# Patient Record
Sex: Male | Born: 1985
Health system: Southern US, Community
[De-identification: ages and names within clinical notes are randomized; demographics above are authoritative.]

## PROBLEM LIST (undated history)

## (undated) DIAGNOSIS — I10 Essential (primary) hypertension: Secondary | ICD-10-CM

## (undated) DIAGNOSIS — I639 Cerebral infarction, unspecified: Secondary | ICD-10-CM

## (undated) DIAGNOSIS — I82409 Acute embolism and thrombosis of unspecified deep veins of unspecified lower extremity: Secondary | ICD-10-CM

## (undated) DIAGNOSIS — S82892A Other fracture of left lower leg, initial encounter for closed fracture: Secondary | ICD-10-CM

## (undated) HISTORY — PX: OTHER SURGICAL HISTORY: SHX169

---

## 2005-10-27 ENCOUNTER — Emergency Department: Payer: Self-pay | Admitting: Emergency Medicine

## 2009-01-11 ENCOUNTER — Emergency Department: Payer: Self-pay | Admitting: Emergency Medicine

## 2015-08-01 ENCOUNTER — Ambulatory Visit
Admission: EM | Admit: 2015-08-01 | Discharge: 2015-08-01 | Disposition: A | Discharge status: Home / Self Care | Payer: 59 | Attending: Family Medicine | Admitting: Family Medicine

## 2015-08-01 ENCOUNTER — Encounter: Payer: Self-pay | Admitting: *Deleted

## 2015-08-01 ENCOUNTER — Ambulatory Visit (INDEPENDENT_AMBULATORY_CARE_PROVIDER_SITE_OTHER): Payer: 59

## 2015-08-01 DIAGNOSIS — S6000XA Contusion of unspecified finger without damage to nail, initial encounter: Secondary | ICD-10-CM | POA: Diagnosis not present

## 2015-08-01 DIAGNOSIS — S60221A Contusion of right hand, initial encounter: Secondary | ICD-10-CM

## 2015-08-01 MED ORDER — NAPROXEN 500 MG PO TABS: 500.0000 mg | ORAL_TABLET | Freq: Two times a day (BID) | ORAL | Status: DC

## 2015-08-01 NOTE — Discharge Instructions (Signed)
Contusion °A contusion is a deep bruise. Contusions are the result of a blunt injury to tissues and muscle fibers under the skin. The injury causes bleeding under the skin. The skin overlying the contusion may turn blue, purple, or yellow. Minor injuries will give you a painless contusion, but more severe contusions may stay painful and swollen for a few weeks.  °CAUSES  °This condition is usually caused by a blow, trauma, or direct force to an area of the body. °SYMPTOMS  °Symptoms of this condition include: °· Swelling of the injured area. °· Pain and tenderness in the injured area. °· Discoloration. The area may have redness and then turn blue, purple, or yellow. °DIAGNOSIS  °This condition is diagnosed based on a physical exam and medical history. An X-ray, CT scan, or MRI may be needed to determine if there are any associated injuries, such as broken bones (fractures). °TREATMENT  °Specific treatment for this condition depends on what area of the body was injured. In general, the best treatment for a contusion is resting, icing, applying pressure to (compression), and elevating the injured area. This is often called the RICE strategy. Over-the-counter anti-inflammatory medicines may also be recommended for pain control.  °HOME CARE INSTRUCTIONS  °· Rest the injured area. °· If directed, apply ice to the injured area: °¨ Put ice in a plastic bag. °¨ Place a towel between your skin and the bag. °¨ Leave the ice on for 20 minutes, 2-3 times per day. °· If directed, apply light compression to the injured area using an elastic bandage. Make sure the bandage is not wrapped too tightly. Remove and reapply the bandage as directed by your health care provider. °· If possible, raise (elevate) the injured area above the level of your heart while you are sitting or lying down. °· Take over-the-counter and prescription medicines only as told by your health care provider. °SEEK MEDICAL CARE IF: °· Your symptoms do not  improve after several days of treatment. °· Your symptoms get worse. °· You have difficulty moving the injured area. °SEEK IMMEDIATE MEDICAL CARE IF:  °· You have severe pain. °· You have numbness in a hand or foot. °· Your hand or foot turns pale or cold. °  °This information is not intended to replace advice given to you by your health care provider. Make sure you discuss any questions you have with your health care provider. °  °Document Released: 12/12/2004 Document Revised: 11/23/2014 Document Reviewed: 07/20/2014 °Elsevier Interactive Patient Education ©2016 [employer] ° °Hand Contusion °A hand contusion is a deep bruise on your hand area. Contusions are the result of an injury that caused bleeding under the skin. The contusion may turn blue, purple, or yellow. Minor injuries will give you a painless contusion, but more severe contusions may stay painful and swollen for a few weeks. °CAUSES  °A contusion is usually caused by a blow, trauma, or direct force to an area of the body. °SYMPTOMS  °· Swelling and redness of the injured area. °· Discoloration of the injured area. °· Tenderness and soreness of the injured area. °· Pain. °DIAGNOSIS  °The diagnosis can be made by taking a history and performing a physical exam. An X-ray, CT scan, or MRI may be needed to determine if there were any associated injuries, such as broken bones (fractures). °TREATMENT  °Often, the best treatment for a hand contusion is resting, elevating, icing, and applying cold compresses to the injured area. Over-the-counter medicines may also be recommended   for pain control. °HOME CARE INSTRUCTIONS  °· Put ice on the injured area. °¨ Put ice in a plastic bag. °¨ Place a towel between your skin and the bag. °¨ Leave the ice on for 15-20 minutes, 03-04 times a day. °· Only take over-the-counter or prescription medicines as directed by your caregiver. Your caregiver may recommend avoiding anti-inflammatory medicines (aspirin, ibuprofen,  and naproxen) for 48 hours because these medicines may increase bruising. °· If told, use an elastic wrap as directed. This can help reduce swelling. You may remove the wrap for sleeping, showering, and bathing. If your fingers become numb, cold, or blue, take the wrap off and reapply it more loosely. °· Elevate your hand with pillows to reduce swelling. °· Avoid overusing your hand if it is painful. °SEEK IMMEDIATE MEDICAL CARE IF:  °· You have increased redness, swelling, or pain in your hand. °· Your swelling or pain is not relieved with medicines. °· You have loss of feeling in your hand or are unable to move your fingers. °· Your hand turns cold or blue. °· You have pain when you move your fingers. °· Your hand becomes warm to the touch. °· Your contusion does not improve in 2 days. °MAKE SURE YOU:  °· Understand these instructions. °· Will watch your condition. °· Will get help right away if you are not doing well or get worse. °  °This information is not intended to replace advice given to you by your health care provider. Make sure you discuss any questions you have with your health care provider. °  °Document Released: 08/24/2001 Document Revised: 11/27/2011 Document Reviewed: 08/26/2011 °Elsevier Interactive Patient Education ©2016 Elsevier Inc. ° °
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
## 2015-08-15 ENCOUNTER — Inpatient Hospital Stay (HOSPITAL_COMMUNITY)
Admission: EM | Admit: 2015-08-15 | Discharge: 2015-09-01 | DRG: 003 | Disposition: A | Discharge status: Rehabilitation Facility | Payer: 59 | Attending: Neurology | Admitting: Neurology

## 2015-08-15 ENCOUNTER — Emergency Department (HOSPITAL_COMMUNITY)
Admit: 2015-08-15 | Discharge: 2015-08-15 | Disposition: A | Discharge status: Home / Self Care | Payer: 59 | Attending: Emergency Medicine | Admitting: Emergency Medicine

## 2015-08-15 ENCOUNTER — Other Ambulatory Visit (HOSPITAL_COMMUNITY): Payer: Self-pay | Admitting: Emergency Medicine

## 2015-08-15 ENCOUNTER — Other Ambulatory Visit: Payer: Self-pay

## 2015-08-15 ENCOUNTER — Encounter (HOSPITAL_COMMUNITY): Payer: Self-pay | Admitting: Emergency Medicine

## 2015-08-15 DIAGNOSIS — B9561 Methicillin susceptible Staphylococcus aureus infection as the cause of diseases classified elsewhere: Secondary | ICD-10-CM | POA: Diagnosis present

## 2015-08-15 DIAGNOSIS — Z452 Encounter for adjustment and management of vascular access device: Secondary | ICD-10-CM

## 2015-08-15 DIAGNOSIS — I619 Nontraumatic intracerebral hemorrhage, unspecified: Secondary | ICD-10-CM

## 2015-08-15 DIAGNOSIS — I248 Other forms of acute ischemic heart disease: Secondary | ICD-10-CM | POA: Diagnosis present

## 2015-08-15 DIAGNOSIS — D72829 Elevated white blood cell count, unspecified: Secondary | ICD-10-CM | POA: Diagnosis present

## 2015-08-15 DIAGNOSIS — R29710 NIHSS score 10: Secondary | ICD-10-CM | POA: Diagnosis present

## 2015-08-15 DIAGNOSIS — Z6841 Body Mass Index (BMI) 40.0 and over, adult: Secondary | ICD-10-CM

## 2015-08-15 DIAGNOSIS — I161 Hypertensive emergency: Secondary | ICD-10-CM | POA: Diagnosis present

## 2015-08-15 DIAGNOSIS — J15211 Pneumonia due to Methicillin susceptible Staphylococcus aureus: Secondary | ICD-10-CM | POA: Diagnosis present

## 2015-08-15 DIAGNOSIS — R531 Weakness: Secondary | ICD-10-CM

## 2015-08-15 DIAGNOSIS — R1310 Dysphagia, unspecified: Secondary | ICD-10-CM | POA: Diagnosis present

## 2015-08-15 DIAGNOSIS — I69191 Dysphagia following nontraumatic intracerebral hemorrhage: Secondary | ICD-10-CM

## 2015-08-15 DIAGNOSIS — R21 Rash and other nonspecific skin eruption: Secondary | ICD-10-CM | POA: Diagnosis not present

## 2015-08-15 DIAGNOSIS — E878 Other disorders of electrolyte and fluid balance, not elsewhere classified: Secondary | ICD-10-CM | POA: Diagnosis not present

## 2015-08-15 DIAGNOSIS — Z8673 Personal history of transient ischemic attack (TIA), and cerebral infarction without residual deficits: Secondary | ICD-10-CM | POA: Diagnosis present

## 2015-08-15 DIAGNOSIS — D62 Acute posthemorrhagic anemia: Secondary | ICD-10-CM | POA: Diagnosis present

## 2015-08-15 DIAGNOSIS — B9689 Other specified bacterial agents as the cause of diseases classified elsewhere: Secondary | ICD-10-CM | POA: Diagnosis not present

## 2015-08-15 DIAGNOSIS — T361X5A Adverse effect of cephalosporins and other beta-lactam antibiotics, initial encounter: Secondary | ICD-10-CM | POA: Diagnosis not present

## 2015-08-15 DIAGNOSIS — I82402 Acute embolism and thrombosis of unspecified deep veins of left lower extremity: Secondary | ICD-10-CM | POA: Diagnosis not present

## 2015-08-15 DIAGNOSIS — R4781 Slurred speech: Secondary | ICD-10-CM | POA: Diagnosis present

## 2015-08-15 DIAGNOSIS — I609 Nontraumatic subarachnoid hemorrhage, unspecified: Secondary | ICD-10-CM

## 2015-08-15 DIAGNOSIS — M7989 Other specified soft tissue disorders: Secondary | ICD-10-CM

## 2015-08-15 DIAGNOSIS — I69359 Hemiplegia and hemiparesis following cerebral infarction affecting unspecified side: Secondary | ICD-10-CM | POA: Diagnosis present

## 2015-08-15 DIAGNOSIS — J9600 Acute respiratory failure, unspecified whether with hypoxia or hypercapnia: Secondary | ICD-10-CM | POA: Diagnosis present

## 2015-08-15 DIAGNOSIS — I10 Essential (primary) hypertension: Secondary | ICD-10-CM | POA: Diagnosis present

## 2015-08-15 DIAGNOSIS — N390 Urinary tract infection, site not specified: Secondary | ICD-10-CM | POA: Diagnosis not present

## 2015-08-15 DIAGNOSIS — E870 Hyperosmolality and hypernatremia: Secondary | ICD-10-CM | POA: Diagnosis not present

## 2015-08-15 DIAGNOSIS — Z91018 Allergy to other foods: Secondary | ICD-10-CM

## 2015-08-15 DIAGNOSIS — I824Y9 Acute embolism and thrombosis of unspecified deep veins of unspecified proximal lower extremity: Secondary | ICD-10-CM

## 2015-08-15 DIAGNOSIS — E876 Hypokalemia: Secondary | ICD-10-CM | POA: Diagnosis not present

## 2015-08-15 DIAGNOSIS — I110 Hypertensive heart disease with heart failure: Secondary | ICD-10-CM | POA: Diagnosis present

## 2015-08-15 DIAGNOSIS — J9601 Acute respiratory failure with hypoxia: Secondary | ICD-10-CM | POA: Diagnosis present

## 2015-08-15 DIAGNOSIS — Z87891 Personal history of nicotine dependence: Secondary | ICD-10-CM

## 2015-08-15 DIAGNOSIS — I82452 Acute embolism and thrombosis of left peroneal vein: Secondary | ICD-10-CM | POA: Diagnosis not present

## 2015-08-15 DIAGNOSIS — Z91030 Bee allergy status: Secondary | ICD-10-CM

## 2015-08-15 DIAGNOSIS — I610 Nontraumatic intracerebral hemorrhage in hemisphere, subcortical: Secondary | ICD-10-CM | POA: Diagnosis present

## 2015-08-15 DIAGNOSIS — Z8249 Family history of ischemic heart disease and other diseases of the circulatory system: Secondary | ICD-10-CM

## 2015-08-15 DIAGNOSIS — G936 Cerebral edema: Secondary | ICD-10-CM | POA: Diagnosis not present

## 2015-08-15 DIAGNOSIS — R509 Fever, unspecified: Secondary | ICD-10-CM | POA: Insufficient documentation

## 2015-08-15 DIAGNOSIS — Z930 Tracheostomy status: Secondary | ICD-10-CM | POA: Diagnosis not present

## 2015-08-15 DIAGNOSIS — R29810 Facial weakness: Secondary | ICD-10-CM | POA: Diagnosis present

## 2015-08-15 DIAGNOSIS — J9690 Respiratory failure, unspecified, unspecified whether with hypoxia or hypercapnia: Secondary | ICD-10-CM

## 2015-08-15 DIAGNOSIS — R4189 Other symptoms and signs involving cognitive functions and awareness: Secondary | ICD-10-CM | POA: Diagnosis present

## 2015-08-15 DIAGNOSIS — H5010 Unspecified exotropia: Secondary | ICD-10-CM | POA: Diagnosis present

## 2015-08-15 DIAGNOSIS — Z791 Long term (current) use of non-steroidal anti-inflammatories (NSAID): Secondary | ICD-10-CM

## 2015-08-15 DIAGNOSIS — G935 Compression of brain: Secondary | ICD-10-CM

## 2015-08-15 DIAGNOSIS — I5033 Acute on chronic diastolic (congestive) heart failure: Secondary | ICD-10-CM | POA: Diagnosis present

## 2015-08-15 DIAGNOSIS — R402422 Glasgow coma scale score 9-12, at arrival to emergency department: Secondary | ICD-10-CM | POA: Diagnosis present

## 2015-08-15 DIAGNOSIS — G8194 Hemiplegia, unspecified affecting left nondominant side: Secondary | ICD-10-CM | POA: Diagnosis present

## 2015-08-15 DIAGNOSIS — I82409 Acute embolism and thrombosis of unspecified deep veins of unspecified lower extremity: Secondary | ICD-10-CM

## 2015-08-15 DIAGNOSIS — J40 Bronchitis, not specified as acute or chronic: Secondary | ICD-10-CM | POA: Diagnosis present

## 2015-08-15 DIAGNOSIS — Z4659 Encounter for fitting and adjustment of other gastrointestinal appliance and device: Secondary | ICD-10-CM

## 2015-08-15 DIAGNOSIS — M79662 Pain in left lower leg: Secondary | ICD-10-CM

## 2015-08-15 HISTORY — DX: Other fracture of left lower leg, initial encounter for closed fracture: S82.892A

## 2015-08-15 HISTORY — DX: Essential (primary) hypertension: I10

## 2015-08-15 LAB — ETHANOL: Alcohol, Ethyl (B): <5 mg/dL (ref <5)

## 2015-08-15 LAB — CBC
HCT: 40.8 % (ref 39.0–52.0)
Hemoglobin: 14 g/dL (ref 13.0–17.0)
MCH: 31.9 pg (ref 26.0–34.0)
MCHC: 34.3 g/dL (ref 30.0–36.0)
MCV: 92.9 fL (ref 78.0–100.0)
Platelets: 217 10*3/uL (ref 150–400)
RBC: 4.39 MIL/uL (ref 4.22–5.81)
RDW: 12.7 % (ref 11.5–15.5)
WBC: 6.7 10*3/uL (ref 4.0–10.5)

## 2015-08-15 LAB — COMPREHENSIVE METABOLIC PANEL
ALT: 20 U/L (ref 17–63)
AST: 23 U/L (ref 15–41)
Albumin: 4.3 g/dL (ref 3.5–5.0)
Alkaline Phosphatase: 58 U/L (ref 38–126)
Anion gap: 6 (ref 5–15)
BUN: 20 mg/dL (ref 6–20)
CO2: 26 mmol/L (ref 22–32)
Calcium: 8.8 mg/dL — ABNORMAL LOW (ref 8.9–10.3)
Chloride: 105 mmol/L (ref 101–111)
Creatinine, Ser: 1.2 mg/dL (ref 0.61–1.24)
GFR calc Af Amer: >60 mL/min (ref >60)
GFR calc non Af Amer: >60 mL/min (ref >60)
Glucose, Bld: 115 mg/dL — ABNORMAL HIGH (ref 65–99)
Potassium: 3.9 mmol/L (ref 3.5–5.1)
Sodium: 137 mmol/L (ref 135–145)
Total Bilirubin: 0.4 mg/dL (ref 0.3–1.2)
Total Protein: 7.1 g/dL (ref 6.5–8.1)

## 2015-08-15 LAB — APTT: aPTT: 32 seconds (ref 24–37)

## 2015-08-15 LAB — DIFFERENTIAL
Basophils Absolute: 0 10*3/uL (ref 0.0–0.1)
Basophils Relative: 0 %
Eosinophils Absolute: 0.1 10*3/uL (ref 0.0–0.7)
Eosinophils Relative: 1 %
Lymphocytes Relative: 42 %
Lymphs Abs: 2.8 10*3/uL (ref 0.7–4.0)
Monocytes Absolute: 0.4 10*3/uL (ref 0.1–1.0)
Monocytes Relative: 6 %
Neutro Abs: 3.4 10*3/uL (ref 1.7–7.7)
Neutrophils Relative %: 51 %

## 2015-08-15 LAB — PROTIME-INR
INR: 0.98 (ref 0.00–1.49)
Prothrombin Time: 13.2 seconds (ref 11.6–15.2)

## 2015-08-15 LAB — GLUCOSE, CAPILLARY: Glucose-Capillary: 126 mg/dL — ABNORMAL HIGH (ref 65–99)

## 2015-08-15 MED ORDER — NICARDIPINE HCL IN NACL 20-0.86 MG/200ML-% IV SOLN
INTRAVENOUS | Status: AC
  Filled 2015-08-15: qty 200

## 2015-08-15 MED ORDER — NICARDIPINE HCL IN NACL 20-0.86 MG/200ML-% IV SOLN
5.0000 mg/h | Freq: Once | INTRAVENOUS | Status: AC
  Administered 2015-08-15: 5 mg/h via INTRAVENOUS

## 2015-08-15 NOTE — ED Notes (Signed)
cbg of 126.

## 2015-08-15 NOTE — ED Provider Notes (Signed)
CSN: [id number]     Arrival date & time 08/15/15  2242 History  By signing my name below, I, Iona Beard, attest that this documentation has been prepared under the direction and in the presence of Marily Memos, MD.   Electronically Signed: Iona Beard, ED Scribe. 08/15/2015. 10:52 PM   Chief Complaint  Patient presents with  . Code Stroke   The history is provided by the patient and the EMS personnel. No language interpreter was used.   HPI Comments: Brad Mcgee is a 30 y.o. male with PMHx of HTN who presents to the Emergency Department via EMS complaining of sudden onset, left-sided weakness, ongoing since 9:45 PM tonight. Pt reports associated right-sided headache and slurred speech. Per EMS, symptoms began while pt was walking. No other associated symptoms noted. No worsening or alleviating factors noted. Pt denies head trauma, or any other pertinent symptoms.    Past Medical History  Diagnosis Date  . Hypertension    History reviewed. No pertinent past surgical history. Family History  Problem Relation Age of Onset  . Hypertension Mother    Social History  Substance Use Topics  . Smoking status: Never Smoker   . Smokeless tobacco: Never Used  . Alcohol Use: No    Review of Systems  Constitutional:       Pt denies head trauma.   Neurological: Positive for speech difficulty, weakness and headaches.       Left-sided weakness.   All other systems reviewed and are negative.   Allergies  Bee venom and Coconut oil  Home Medications   Prior to Admission medications   Medication Sig Start Date End Date Taking? Authorizing Provider  lisinopril (PRINIVIL,ZESTRIL) 10 MG tablet Take 10 mg by mouth daily.   Yes Historical Provider, MD  naproxen (NAPROSYN) 500 MG tablet Take 1 tablet (500 mg total) by mouth 2 (two) times daily with a meal. 08/01/15  Yes Chrissie Noa Roemer, PA-C   BP 147/105 mmHg  Pulse 84  Temp(Src) 98.2 F (36.8 C) (Oral)  Resp 19  Ht 5\' 7"   (1.702 m)  Wt 298 lb (135.172 kg)  BMI 46.66 kg/m2  SpO2 98% Physical Exam  Constitutional: He appears well-developed and well-nourished. No distress.  Slurred speech.  HENT:  Head: Normocephalic and atraumatic.  Eyes: Conjunctivae and EOM are normal.  Neck: Neck supple. No tracheal deviation present.  Cardiovascular: Normal rate.   Pulmonary/Chest: Effort normal. No respiratory distress.  Musculoskeletal: Normal range of motion.  Left arm flaccid.  Neurological: He is alert. No sensory deficit.  Slurred speech. Left sided facial droop. Left arm flaccid. Normal mental status. Normal sensation. 3/5 strength with dorsiflexion/planatarflexion of left foot.   Skin: Skin is warm and dry.  Psychiatric: He has a normal mood and affect. His behavior is normal.    ED Course  .Intubation Date/Time: 08/16/2015 6:25 PM Performed by: Marily Memos Authorized by: Marily Memos Consent: The procedure was performed in an emergent situation. Time out: Immediately prior to procedure a "time out" was called to verify the correct patient, procedure, equipment, support staff and site/side marked as required. Indications: airway protection Intubation method: video-assisted Patient status: paralyzed (RSI) Preoxygenation: BVM Sedatives: etomidate Paralytic: succinylcholine Laryngoscope size: Miller 3 Tube size: 7.5 mm Tube type: cuffed Number of attempts: 2 Ventilation between attempts: BVM Cricoid pressure: no Cords visualized: yes Cuff inflated: yes ETT to lip: 24 cm ETT to teeth: 23 cm Tube secured with: ETT holder Chest x-ray interpreted by me. Chest x-ray  findings: endotracheal tube in appropriate position Patient tolerance: Patient tolerated the procedure well with no immediate complications   CRITICAL CARE Performed by: Marily Memos Total critical care time: 75 minutes Critical care time was exclusive of separately billable procedures and treating other patients. Critical care  was necessary to treat or prevent imminent or life-threatening deterioration. Critical care was time spent personally by me on the following activities: development of treatment plan with patient and/or surrogate as well as nursing, discussions with consultants, evaluation of patient's response to treatment, examination of patient, obtaining history from patient or surrogate, ordering and performing treatments and interventions, ordering and review of laboratory studies, ordering and review of radiographic studies, pulse oximetry and re-evaluation of patient's condition.    (including critical care time) DIAGNOSTIC STUDIES: Oxygen Saturation is 98% on RA, normal by my interpretation.    COORDINATION OF CARE: 10:52 PM Discussed treatment plan with pt at bedside and pt agreed to plan.  Labs Review Labs Reviewed  COMPREHENSIVE METABOLIC PANEL - Abnormal; Notable for the following:    Glucose, Bld 115 (*)    Calcium 8.8 (*)    All other components within normal limits  URINALYSIS, ROUTINE W REFLEX MICROSCOPIC (NOT AT [facility]) - Abnormal; Notable for the following:    Color, Urine STRAW (*)    Specific Gravity, Urine <1.005 (*)    Protein, ur 100 (*)    All other components within normal limits  TROPONIN I - Abnormal; Notable for the following:    Troponin I 0.05 (*)    All other components within normal limits  URINE MICROSCOPIC-ADD ON - Abnormal; Notable for the following:    Bacteria, UA RARE (*)    All other components within normal limits  TRIGLYCERIDES - Abnormal; Notable for the following:    Triglycerides 208 (*)    All other components within normal limits  BLOOD GAS, ARTERIAL - Abnormal; Notable for the following:    pH, Arterial 7.333 (*)    pCO2 arterial 45.2 (*)    pO2, Arterial 73.9 (*)    All other components within normal limits  TROPONIN I - Abnormal; Notable for the following:    Troponin I 0.06 (*)    All other components within normal limits  BASIC METABOLIC PANEL -  Abnormal; Notable for the following:    Calcium 8.8 (*)    All other components within normal limits  GLUCOSE, CAPILLARY - Abnormal; Notable for the following:    Glucose-Capillary 113 (*)    All other components within normal limits  TROPONIN I - Abnormal; Notable for the following:    Troponin I 0.06 (*)    All other components within normal limits  MRSA PCR SCREENING  ETHANOL  PROTIME-INR  APTT  CBC  DIFFERENTIAL  URINE RAPID DRUG SCREEN, HOSP PERFORMED  SODIUM  CBC  TRIGLYCERIDES  SODIUM  SODIUM  SODIUM  CBC    Imaging Review Ct Head Wo Contrast  08/16/2015  CLINICAL DATA:  Followup intracranial hemorrhage EXAM: CT HEAD WITHOUT CONTRAST TECHNIQUE: Contiguous axial images were obtained from the base of the skull through the vertex without intravenous contrast. COMPARISON:  Earlier same day FINDINGS: Intraparenchymal hematoma in the right basal ganglia measures 5.2 x 1.9 x 3.0 cm (volume = 15.4 cc). This is not significantly changed since the previous exam 1 volume was calculated at 15.1 cc. Surrounding edema is slightly better defined. Mild mass effect with some compression of the right lateral ventricle and right-to-left shift unchanged at 2 mm.  No evidence of intraventricular or subarachnoid penetration. The remainder the brain appears normal. Calvarium is normal. No significant sinus disease. IMPRESSION: No significant change since the previous study. Right basal ganglia hematoma volume 15.4 cc. Slightly better defined surrounding edema. Electronically Signed   By: Paulina Fusi M.D.   On: 08/16/2015 12:20   Ct Head Wo Contrast  08/16/2015  CLINICAL DATA:  Follow-up intracranial hemorrhage. EXAM: CT HEAD WITHOUT CONTRAST TECHNIQUE: Contiguous axial images were obtained from the base of the skull through the vertex without intravenous contrast. COMPARISON:  Yesterday at 2238 hour FINDINGS: Right basal gangliar hemorrhage measures 4.5 x 1.9 x 3.4 cm (volume = 15.1 cc), previously 3.7  x 1.9 x 2.0 cm (volume =7.3 cc). There is persistent mass-effect on the frontal horn of the right lateral ventricle. There is now all 2 mm right to left midline shift. There is progressive supratentorial sulcal effacement. Persistent basilar cistern effacement. No intraventricular extension. No new areas of hemorrhage are seen. IMPRESSION: Increased size of acute right basal ganglia hemorrhage, with increased mass effect. There is now 2 mm right to left midline shift. There is developing supratentorial sulcal effacement and persistent basilar cistern effacement concerning for developing cerebral edema. These results were called by telephone at the time of interpretation on 08/16/2015 at 2:10 am to Dr. Marily Memos , who verbally acknowledged these results. Electronically Signed   By: Rubye Oaks M.D.   On: 08/16/2015 02:10   Dg Chest Port 1 View  08/16/2015  CLINICAL DATA:  Central line placement. EXAM: PORTABLE CHEST 1 VIEW COMPARISON:  Earlier this day at 0139 hour FINDINGS: Endotracheal tube is 3.1 cm from the carina. Enteric tube in place. Tip of the right central line in the mid SVC. No pneumothorax. Low lung volumes. Enlargement of the cardiac silhouette is unchanged. Diminished vascular congestion and pulmonary edema from prior exam. Persistent perihilar opacities. IMPRESSION: 1. Tip of the right central line in the mid SVC.  No pneumothorax. 2. Endotracheal and enteric tubes remain in place. 3. Unchanged enlargement of the cardiac silhouette. Decreased vascular congestion and edema from prior. Electronically Signed   By: Rubye Oaks M.D.   On: 08/16/2015 06:54   Dg Chest Portable 1 View  08/16/2015  CLINICAL DATA:  Endotracheal tube placement.  Initial encounter. EXAM: PORTABLE CHEST 1 VIEW COMPARISON:  None. FINDINGS: The patient's endotracheal tube is seen ending 2-3 cm above the carina. The lungs are mildly hypoexpanded. Vascular crowding and vascular congestion are seen. Increased  interstitial markings raise concern for mild pulmonary edema, though pneumonia could have a similar appearance. There is no evidence of pleural effusion or pneumothorax. The cardiomediastinal silhouette is borderline normal in size. No acute osseous abnormalities are seen. IMPRESSION: 1. Endotracheal tube seen ending 2-3 cm above the carina. 2. Lungs mildly hypoexpanded. Vascular congestion noted. Increased interstitial markings raise concern for mild pulmonary edema, though pneumonia could have a similar appearance. Electronically Signed   By: Roanna Raider M.D.   On: 08/16/2015 02:07   Dg Abd Portable 1v  08/16/2015  CLINICAL DATA:  Orogastric tube placement. EXAM: PORTABLE ABDOMEN - 1 VIEW COMPARISON:  None. FINDINGS: There is an enteric tube with 1 small chole over the stomach and tip right of midline likely over the distal stomach or proximal duodenum. Bowel gas pattern is nonobstructive. Remainder of the exam is unremarkable. IMPRESSION: No acute findings per Enteric tube with tip in the right upper quadrant likely over the distal stomach or proximal duodenum. Electronically Signed  By: Elberta Fortisaniel  Boyle M.D.   On: 08/16/2015 07:20   Ct Head Code Stroke W/o Cm  08/15/2015  CLINICAL DATA:  Left-sided weakness.  Code stroke. EXAM: CT HEAD WITHOUT CONTRAST TECHNIQUE: Contiguous axial images were obtained from the base of the skull through the vertex without intravenous contrast. COMPARISON:  None. FINDINGS: Acute right basal ganglia hemorrhage measures 3.7 x 1.9 x 2.0 cm (volume = 7.3 cc). There is mild edema and mass effect on the frontal horn of the right lateral ventricle but no significant midline shift. No intraventricular extension. No additional acute hemorrhage. There is crowding of the basilar cisterns. No acute ischemia elsewhere. The calvarium is intact. Included paranasal sinuses and mastoid air cells are well aerated. IMPRESSION: Acute right basal ganglia hemorrhage measuring 3.7 x 1.9 x 2.0 cm.  Mild surrounding edema and mass effect, no midline shift. There is crowding of the basilar cisterns. Critical Value/emergent results were called by telephone at the time of interpretation on 08/15/2015 at 10:56 pm to Dr. Clayborne DanaMesner, who verbally acknowledged these results. Electronically Signed   By: Rubye OaksMelanie  Ehinger M.D.   On: 08/15/2015 22:56   I have personally reviewed and evaluated these images and lab results as part of my medical decision-making.   EKG Interpretation None      MDM   Final diagnoses:  Encounter for orogastric (OG) tube placement  Respiratory failure (HCC)  Encounter for central line placement  ICH (intracerebral hemorrhage) (HCC)   Acute onset of facial droop and left sided weakness at 2145. Ems called immediately. Taken straight to CT here where r basal ganglia bleed was noted. Patient with 0/5 in LUE, left facial droop and 3/5 in LLE initially. No headache or other complaints. Awake, joking, giving thumbs up with right hand.   [facility] and [facility] to get [facility] bed. Patient now with headache, diaphoresis and tachycardia. Sympathetic response likely 2/2 pain, will order fentanyl.   Patient with decreasing mental status. Concern for clinical worsening and anticipated course, so intubated for airway protection prior to transport after discussion with neurology. Difficult intubation 2/2 quick desaturation. Sedated with propfol and fentnayl. Hyperventilated, mannitor given, HOB at 30 degrees. Repeat CT with worsening of bleed volume, updated family , pateint transferred via [facility] in critical condition.   I personally performed the services described in this documentation, which was scribed in my presence. The recorded information has been reviewed and is accurate.       Marily MemosJason Blessin Kanno, MD 08/16/15 [phone]

## 2015-08-15 NOTE — ED Notes (Signed)
Pt c/o slurred speech and left sided weakness that started at 2145. Pt c/o headache.

## 2015-08-16 ENCOUNTER — Inpatient Hospital Stay (HOSPITAL_COMMUNITY): Payer: 59

## 2015-08-16 DIAGNOSIS — J988 Other specified respiratory disorders: Secondary | ICD-10-CM

## 2015-08-16 DIAGNOSIS — R509 Fever, unspecified: Secondary | ICD-10-CM | POA: Diagnosis not present

## 2015-08-16 DIAGNOSIS — R1310 Dysphagia, unspecified: Secondary | ICD-10-CM | POA: Diagnosis present

## 2015-08-16 DIAGNOSIS — I610 Nontraumatic intracerebral hemorrhage in hemisphere, subcortical: Principal | ICD-10-CM

## 2015-08-16 DIAGNOSIS — T361X5A Adverse effect of cephalosporins and other beta-lactam antibiotics, initial encounter: Secondary | ICD-10-CM | POA: Diagnosis not present

## 2015-08-16 DIAGNOSIS — R21 Rash and other nonspecific skin eruption: Secondary | ICD-10-CM | POA: Diagnosis not present

## 2015-08-16 DIAGNOSIS — E870 Hyperosmolality and hypernatremia: Secondary | ICD-10-CM | POA: Diagnosis not present

## 2015-08-16 DIAGNOSIS — N179 Acute kidney failure, unspecified: Secondary | ICD-10-CM | POA: Diagnosis not present

## 2015-08-16 DIAGNOSIS — Z91030 Bee allergy status: Secondary | ICD-10-CM | POA: Diagnosis not present

## 2015-08-16 DIAGNOSIS — R402422 Glasgow coma scale score 9-12, at arrival to emergency department: Secondary | ICD-10-CM | POA: Diagnosis present

## 2015-08-16 DIAGNOSIS — G935 Compression of brain: Secondary | ICD-10-CM | POA: Diagnosis not present

## 2015-08-16 DIAGNOSIS — I5032 Chronic diastolic (congestive) heart failure: Secondary | ICD-10-CM | POA: Diagnosis not present

## 2015-08-16 DIAGNOSIS — I619 Nontraumatic intracerebral hemorrhage, unspecified: Secondary | ICD-10-CM | POA: Diagnosis not present

## 2015-08-16 DIAGNOSIS — Z6841 Body Mass Index (BMI) 40.0 and over, adult: Secondary | ICD-10-CM | POA: Diagnosis not present

## 2015-08-16 DIAGNOSIS — I82409 Acute embolism and thrombosis of unspecified deep veins of unspecified lower extremity: Secondary | ICD-10-CM | POA: Diagnosis not present

## 2015-08-16 DIAGNOSIS — I10 Essential (primary) hypertension: Secondary | ICD-10-CM | POA: Diagnosis present

## 2015-08-16 DIAGNOSIS — J9600 Acute respiratory failure, unspecified whether with hypoxia or hypercapnia: Secondary | ICD-10-CM | POA: Diagnosis not present

## 2015-08-16 DIAGNOSIS — I5033 Acute on chronic diastolic (congestive) heart failure: Secondary | ICD-10-CM | POA: Diagnosis present

## 2015-08-16 DIAGNOSIS — B9561 Methicillin susceptible Staphylococcus aureus infection as the cause of diseases classified elsewhere: Secondary | ICD-10-CM | POA: Diagnosis present

## 2015-08-16 DIAGNOSIS — I160 Hypertensive urgency: Secondary | ICD-10-CM

## 2015-08-16 DIAGNOSIS — J40 Bronchitis, not specified as acute or chronic: Secondary | ICD-10-CM | POA: Diagnosis present

## 2015-08-16 DIAGNOSIS — I6789 Other cerebrovascular disease: Secondary | ICD-10-CM

## 2015-08-16 DIAGNOSIS — G936 Cerebral edema: Secondary | ICD-10-CM | POA: Diagnosis present

## 2015-08-16 DIAGNOSIS — I82492 Acute embolism and thrombosis of other specified deep vein of left lower extremity: Secondary | ICD-10-CM | POA: Diagnosis not present

## 2015-08-16 DIAGNOSIS — E871 Hypo-osmolality and hyponatremia: Secondary | ICD-10-CM | POA: Diagnosis not present

## 2015-08-16 DIAGNOSIS — B9689 Other specified bacterial agents as the cause of diseases classified elsewhere: Secondary | ICD-10-CM | POA: Diagnosis not present

## 2015-08-16 DIAGNOSIS — E878 Other disorders of electrolyte and fluid balance, not elsewhere classified: Secondary | ICD-10-CM | POA: Diagnosis not present

## 2015-08-16 DIAGNOSIS — D62 Acute posthemorrhagic anemia: Secondary | ICD-10-CM | POA: Diagnosis present

## 2015-08-16 DIAGNOSIS — I110 Hypertensive heart disease with heart failure: Secondary | ICD-10-CM | POA: Diagnosis present

## 2015-08-16 DIAGNOSIS — I69191 Dysphagia following nontraumatic intracerebral hemorrhage: Secondary | ICD-10-CM | POA: Diagnosis not present

## 2015-08-16 DIAGNOSIS — Z430 Encounter for attention to tracheostomy: Secondary | ICD-10-CM | POA: Diagnosis not present

## 2015-08-16 DIAGNOSIS — Z8249 Family history of ischemic heart disease and other diseases of the circulatory system: Secondary | ICD-10-CM | POA: Diagnosis not present

## 2015-08-16 DIAGNOSIS — E669 Obesity, unspecified: Secondary | ICD-10-CM | POA: Diagnosis not present

## 2015-08-16 DIAGNOSIS — J15211 Pneumonia due to Methicillin susceptible Staphylococcus aureus: Secondary | ICD-10-CM | POA: Diagnosis present

## 2015-08-16 DIAGNOSIS — G8194 Hemiplegia, unspecified affecting left nondominant side: Secondary | ICD-10-CM | POA: Diagnosis present

## 2015-08-16 DIAGNOSIS — I161 Hypertensive emergency: Secondary | ICD-10-CM | POA: Diagnosis present

## 2015-08-16 DIAGNOSIS — I82402 Acute embolism and thrombosis of unspecified deep veins of left lower extremity: Secondary | ICD-10-CM | POA: Diagnosis not present

## 2015-08-16 DIAGNOSIS — M79662 Pain in left lower leg: Secondary | ICD-10-CM | POA: Diagnosis not present

## 2015-08-16 DIAGNOSIS — Z87891 Personal history of nicotine dependence: Secondary | ICD-10-CM | POA: Diagnosis not present

## 2015-08-16 DIAGNOSIS — I609 Nontraumatic subarachnoid hemorrhage, unspecified: Secondary | ICD-10-CM

## 2015-08-16 DIAGNOSIS — Z7901 Long term (current) use of anticoagulants: Secondary | ICD-10-CM | POA: Diagnosis not present

## 2015-08-16 DIAGNOSIS — Z91018 Allergy to other foods: Secondary | ICD-10-CM | POA: Diagnosis not present

## 2015-08-16 DIAGNOSIS — J9601 Acute respiratory failure with hypoxia: Secondary | ICD-10-CM | POA: Diagnosis present

## 2015-08-16 DIAGNOSIS — R29710 NIHSS score 10: Secondary | ICD-10-CM | POA: Diagnosis present

## 2015-08-16 DIAGNOSIS — E662 Morbid (severe) obesity with alveolar hypoventilation: Secondary | ICD-10-CM | POA: Diagnosis not present

## 2015-08-16 DIAGNOSIS — R29810 Facial weakness: Secondary | ICD-10-CM | POA: Diagnosis present

## 2015-08-16 DIAGNOSIS — Z791 Long term (current) use of non-steroidal anti-inflammatories (NSAID): Secondary | ICD-10-CM | POA: Diagnosis not present

## 2015-08-16 DIAGNOSIS — H5010 Unspecified exotropia: Secondary | ICD-10-CM | POA: Diagnosis present

## 2015-08-16 DIAGNOSIS — Z930 Tracheostomy status: Secondary | ICD-10-CM | POA: Diagnosis not present

## 2015-08-16 DIAGNOSIS — R531 Weakness: Secondary | ICD-10-CM | POA: Diagnosis present

## 2015-08-16 DIAGNOSIS — N390 Urinary tract infection, site not specified: Secondary | ICD-10-CM | POA: Diagnosis not present

## 2015-08-16 DIAGNOSIS — I248 Other forms of acute ischemic heart disease: Secondary | ICD-10-CM | POA: Diagnosis present

## 2015-08-16 DIAGNOSIS — I69391 Dysphagia following cerebral infarction: Secondary | ICD-10-CM | POA: Diagnosis not present

## 2015-08-16 DIAGNOSIS — E876 Hypokalemia: Secondary | ICD-10-CM | POA: Diagnosis not present

## 2015-08-16 DIAGNOSIS — D72829 Elevated white blood cell count, unspecified: Secondary | ICD-10-CM | POA: Diagnosis not present

## 2015-08-16 DIAGNOSIS — R4781 Slurred speech: Secondary | ICD-10-CM | POA: Diagnosis present

## 2015-08-16 DIAGNOSIS — R4189 Other symptoms and signs involving cognitive functions and awareness: Secondary | ICD-10-CM | POA: Diagnosis present

## 2015-08-16 DIAGNOSIS — M7989 Other specified soft tissue disorders: Secondary | ICD-10-CM | POA: Diagnosis not present

## 2015-08-16 LAB — URINALYSIS, ROUTINE W REFLEX MICROSCOPIC
Bilirubin Urine: NEGATIVE
Glucose, UA: NEGATIVE mg/dL
Hgb urine dipstick: NEGATIVE
Ketones, ur: NEGATIVE mg/dL
Leukocytes, UA: NEGATIVE
Nitrite: NEGATIVE
Protein, ur: 100 mg/dL — AB
Specific Gravity, Urine: <1.005 — ABNORMAL LOW (ref 1.005–1.030)
pH: 6.5 (ref 5.0–8.0)

## 2015-08-16 LAB — ECHOCARDIOGRAM COMPLETE
Height: 67 in
Weight: 4768 oz

## 2015-08-16 LAB — TROPONIN I
Troponin I: 0.05 ng/mL — ABNORMAL HIGH (ref <0.031)
Troponin I: 0.06 ng/mL — ABNORMAL HIGH (ref <0.031)
Troponin I: 0.06 ng/mL — ABNORMAL HIGH (ref <0.031)

## 2015-08-16 LAB — BLOOD GAS, ARTERIAL
Acid-base deficit: 1.7 mmol/L (ref 0.0–2.0)
Bicarbonate: 23.3 mEq/L (ref 20.0–24.0)
Drawn by: 418751
FIO2: 0.4
MECHVT: 600 mL
O2 Saturation: 93.2 %
PEEP: 5 cmH2O
Patient temperature: 98.6
RATE: 16 resp/min
TCO2: 24.7 mmol/L (ref 0–100)
pCO2 arterial: 45.2 mmHg — ABNORMAL HIGH (ref 35.0–45.0)
pH, Arterial: 7.333 — ABNORMAL LOW (ref 7.350–7.450)
pO2, Arterial: 73.9 mmHg — ABNORMAL LOW (ref 80.0–100.0)

## 2015-08-16 LAB — BASIC METABOLIC PANEL
Anion gap: 6 (ref 5–15)
BUN: 13 mg/dL (ref 6–20)
CO2: 25 mmol/L (ref 22–32)
Calcium: 8.8 mg/dL — ABNORMAL LOW (ref 8.9–10.3)
Chloride: 105 mmol/L (ref 101–111)
Creatinine, Ser: 1.04 mg/dL (ref 0.61–1.24)
GFR calc Af Amer: >60 mL/min (ref >60)
GFR calc non Af Amer: >60 mL/min (ref >60)
Glucose, Bld: 97 mg/dL (ref 65–99)
Potassium: 4.4 mmol/L (ref 3.5–5.1)
Sodium: 136 mmol/L (ref 135–145)

## 2015-08-16 LAB — CBC
HCT: 42.2 % (ref 39.0–52.0)
Hemoglobin: 14.2 g/dL (ref 13.0–17.0)
MCH: 31.1 pg (ref 26.0–34.0)
MCHC: 33.6 g/dL (ref 30.0–36.0)
MCV: 92.5 fL (ref 78.0–100.0)
Platelets: 200 10*3/uL (ref 150–400)
RBC: 4.56 MIL/uL (ref 4.22–5.81)
RDW: 12.8 % (ref 11.5–15.5)
WBC: 8 10*3/uL (ref 4.0–10.5)

## 2015-08-16 LAB — SODIUM
Sodium: 141 mmol/L (ref 135–145)
Sodium: 141 mmol/L (ref 135–145)
Sodium: 144 mmol/L (ref 135–145)

## 2015-08-16 LAB — RAPID URINE DRUG SCREEN, HOSP PERFORMED
Amphetamines: NOT DETECTED
Barbiturates: NOT DETECTED
Benzodiazepines: NOT DETECTED
Cocaine: NOT DETECTED
Opiates: NOT DETECTED
Tetrahydrocannabinol: NOT DETECTED

## 2015-08-16 LAB — URINE MICROSCOPIC-ADD ON: Squamous Epithelial / LPF: NONE SEEN

## 2015-08-16 LAB — MRSA PCR SCREENING: MRSA by PCR: NEGATIVE

## 2015-08-16 LAB — GLUCOSE, CAPILLARY: Glucose-Capillary: 113 mg/dL — ABNORMAL HIGH (ref 65–99)

## 2015-08-16 LAB — TRIGLYCERIDES
Triglycerides: 208 mg/dL — ABNORMAL HIGH (ref <150)
Triglycerides: 148 mg/dL (ref <150)

## 2015-08-16 MED ORDER — PROPOFOL 1000 MG/100ML IV EMUL
0.0000 ug/kg/min | INTRAVENOUS | Status: DC
  Administered 2015-08-16: 6.164 ug/kg/min via INTRAVENOUS
  Filled 2015-08-16: qty 100

## 2015-08-16 MED ORDER — DOCUSATE SODIUM 50 MG/5ML PO LIQD
100.0000 mg | Freq: Two times a day (BID) | ORAL | Status: DC | PRN
  Filled 2015-08-16: qty 10

## 2015-08-16 MED ORDER — CHLORHEXIDINE GLUCONATE 0.12% ORAL RINSE (MEDLINE KIT)
15.0000 mL | Freq: Two times a day (BID) | OROMUCOSAL | Status: DC
  Administered 2015-08-16 – 2015-08-27 (×22): 15 mL via OROMUCOSAL

## 2015-08-16 MED ORDER — MANNITOL 20 % IV SOLN
0.5000 g/kg | Freq: Once | INTRAVENOUS | Status: AC
  Administered 2015-08-16: 67.6 g via INTRAVENOUS
  Filled 2015-08-16: qty 500

## 2015-08-16 MED ORDER — SENNOSIDES-DOCUSATE SODIUM 8.6-50 MG PO TABS
1.0000 | ORAL_TABLET | Freq: Two times a day (BID) | ORAL | Status: DC
  Administered 2015-08-16 – 2015-08-21 (×10): 1 via ORAL
  Filled 2015-08-16 (×10): qty 1

## 2015-08-16 MED ORDER — FENTANYL CITRATE (PF) 100 MCG/2ML IJ SOLN
100.0000 ug | INTRAMUSCULAR | Status: DC | PRN
  Filled 2015-08-16: qty 2

## 2015-08-16 MED ORDER — SODIUM CHLORIDE 3 % IV SOLN
INTRAVENOUS | Status: DC
  Administered 2015-08-16 – 2015-08-17 (×8): 100 mL/h via INTRAVENOUS
  Administered 2015-08-18: 50 mL/h via INTRAVENOUS
  Administered 2015-08-18 (×2): 100 mL/h via INTRAVENOUS
  Administered 2015-08-19 – 2015-08-22 (×8): 50 mL/h via INTRAVENOUS
  Administered 2015-08-22: 25 mL/h via INTRAVENOUS
  Administered 2015-08-22: 50 mL/h via INTRAVENOUS
  Filled 2015-08-16 (×33): qty 500

## 2015-08-16 MED ORDER — PROPOFOL 1000 MG/100ML IV EMUL
0.0000 ug/kg/min | INTRAVENOUS | Status: DC
  Administered 2015-08-16 – 2015-08-18 (×18): 50 ug/kg/min via INTRAVENOUS
  Administered 2015-08-18: 25 ug/kg/min via INTRAVENOUS
  Administered 2015-08-18 (×2): 50 ug/kg/min via INTRAVENOUS
  Administered 2015-08-18: 35 ug/kg/min via INTRAVENOUS
  Administered 2015-08-18 – 2015-08-19 (×5): 50 ug/kg/min via INTRAVENOUS
  Administered 2015-08-19: 25 ug/kg/min via INTRAVENOUS
  Administered 2015-08-19 (×2): 50 ug/kg/min via INTRAVENOUS
  Administered 2015-08-19 – 2015-08-20 (×7): 25 ug/kg/min via INTRAVENOUS
  Administered 2015-08-21: 10 ug/kg/min via INTRAVENOUS
  Administered 2015-08-21: 25 ug/kg/min via INTRAVENOUS
  Administered 2015-08-21: 10 ug/kg/min via INTRAVENOUS
  Administered 2015-08-21: 30 ug/kg/min via INTRAVENOUS
  Filled 2015-08-16 (×43): qty 100

## 2015-08-16 MED ORDER — ANTISEPTIC ORAL RINSE SOLUTION (CORINZ)
7.0000 mL | OROMUCOSAL | Status: DC
  Administered 2015-08-16 – 2015-08-25 (×87): 7 mL via OROMUCOSAL

## 2015-08-16 MED ORDER — FENTANYL BOLUS VIA INFUSION
50.0000 ug | INTRAVENOUS | Status: DC | PRN
  Filled 2015-08-16: qty 50

## 2015-08-16 MED ORDER — NICARDIPINE HCL IN NACL 40-0.83 MG/200ML-% IV SOLN
3.0000 mg/h | INTRAVENOUS | Status: DC
  Administered 2015-08-16: 5 mg/h via INTRAVENOUS
  Administered 2015-08-16 (×2): 15 mg/h via INTRAVENOUS
  Administered 2015-08-16: 10 mg/h via INTRAVENOUS
  Administered 2015-08-16: 15 mg/h via INTRAVENOUS
  Administered 2015-08-17: 5 mg/h via INTRAVENOUS
  Administered 2015-08-18: 12.5 mg/h via INTRAVENOUS
  Administered 2015-08-18 – 2015-08-19 (×4): 5 mg/h via INTRAVENOUS
  Administered 2015-08-19: 7.5 mg/h via INTRAVENOUS
  Administered 2015-08-20 (×2): 5 mg/h via INTRAVENOUS
  Administered 2015-08-21: 11.5 mg/h via INTRAVENOUS
  Administered 2015-08-21: 7.5 mg/h via INTRAVENOUS
  Administered 2015-08-21: 15 mg/h via INTRAVENOUS
  Administered 2015-08-21: 11.5 mg/h via INTRAVENOUS
  Administered 2015-08-21 (×2): 13 mg/h via INTRAVENOUS
  Administered 2015-08-21: 12.5 mg/h via INTRAVENOUS
  Administered 2015-08-21: 11.5 mg/h via INTRAVENOUS
  Filled 2015-08-16 (×19): qty 200
  Filled 2015-08-16: qty 400
  Filled 2015-08-16 (×3): qty 200

## 2015-08-16 MED ORDER — FENTANYL CITRATE (PF) 100 MCG/2ML IJ SOLN
50.0000 ug | Freq: Once | INTRAMUSCULAR | Status: AC
  Administered 2015-08-16: 50 ug via INTRAVENOUS
  Filled 2015-08-16: qty 2

## 2015-08-16 MED ORDER — MIDAZOLAM HCL 2 MG/2ML IJ SOLN
2.0000 mg | INTRAMUSCULAR | Status: DC | PRN
  Administered 2015-08-16: 2 mg via INTRAVENOUS
  Filled 2015-08-16: qty 2

## 2015-08-16 MED ORDER — FENTANYL CITRATE (PF) 2500 MCG/50ML IJ SOLN
INTRAMUSCULAR | Status: AC
  Filled 2015-08-16: qty 50

## 2015-08-16 MED ORDER — MIDAZOLAM HCL 2 MG/2ML IJ SOLN: 2.0000 mg | INTRAMUSCULAR | Status: DC | PRN

## 2015-08-16 MED ORDER — BISACODYL 10 MG RE SUPP: 10.0000 mg | Freq: Every day | RECTAL | Status: DC | PRN

## 2015-08-16 MED ORDER — ACETAMINOPHEN 650 MG RE SUPP
650.0000 mg | RECTAL | Status: DC | PRN
  Administered 2015-08-19: 650 mg via RECTAL
  Filled 2015-08-16 (×2): qty 1

## 2015-08-16 MED ORDER — LABETALOL HCL 5 MG/ML IV SOLN
10.0000 mg | INTRAVENOUS | Status: DC | PRN
  Administered 2015-08-18 – 2015-08-19 (×3): 20 mg via INTRAVENOUS
  Filled 2015-08-16 (×3): qty 4

## 2015-08-16 MED ORDER — STROKE: EARLY STAGES OF RECOVERY BOOK
Freq: Once | Status: AC
  Administered 2015-08-16: 04:00:00
  Filled 2015-08-16: qty 1

## 2015-08-16 MED ORDER — FENTANYL CITRATE (PF) 100 MCG/2ML IJ SOLN
100.0000 ug | INTRAMUSCULAR | Status: DC | PRN
  Administered 2015-08-16 (×2): 100 ug via INTRAVENOUS

## 2015-08-16 MED ORDER — SODIUM CHLORIDE 0.9 % IV SOLN
25.0000 ug/h | INTRAVENOUS | Status: DC
  Administered 2015-08-17 – 2015-08-20 (×4): 100 ug/h via INTRAVENOUS
  Administered 2015-08-21: 75 ug/h via INTRAVENOUS
  Administered 2015-08-21: 150 ug/h via INTRAVENOUS
  Filled 2015-08-16 (×7): qty 50

## 2015-08-16 MED ORDER — SODIUM CHLORIDE 0.9 % IV SOLN
150.0000 ug/h | INTRAVENOUS | Status: DC
  Filled 2015-08-16: qty 50

## 2015-08-16 MED ORDER — ACETAMINOPHEN 325 MG PO TABS
650.0000 mg | ORAL_TABLET | ORAL | Status: DC | PRN
  Administered 2015-08-17 – 2015-08-27 (×22): 650 mg via ORAL
  Filled 2015-08-16 (×23): qty 2

## 2015-08-16 MED ORDER — MIDAZOLAM HCL 2 MG/2ML IJ SOLN
2.0000 mg | INTRAMUSCULAR | Status: DC | PRN
  Administered 2015-08-22: 2 mg via INTRAVENOUS
  Filled 2015-08-16 (×2): qty 2

## 2015-08-16 MED ORDER — NICARDIPINE HCL IN NACL 20-0.86 MG/200ML-% IV SOLN
3.0000 mg/h | INTRAVENOUS | Status: DC
  Administered 2015-08-16: 15 mg/h via INTRAVENOUS
  Filled 2015-08-16 (×2): qty 200

## 2015-08-16 MED ORDER — FENTANYL CITRATE (PF) 100 MCG/2ML IJ SOLN: 100.0000 ug | INTRAMUSCULAR | Status: DC | PRN

## 2015-08-16 MED ORDER — NICARDIPINE HCL IN NACL 20-0.86 MG/200ML-% IV SOLN
INTRAVENOUS | Status: AC
  Administered 2015-08-16: 20 mg
  Filled 2015-08-16: qty 200

## 2015-08-16 MED ORDER — PROPOFOL 1000 MG/100ML IV EMUL
INTRAVENOUS | Status: AC
  Filled 2015-08-16: qty 100

## 2015-08-16 MED ORDER — PANTOPRAZOLE SODIUM 40 MG IV SOLR
40.0000 mg | Freq: Every day | INTRAVENOUS | Status: DC
  Administered 2015-08-16: 40 mg via INTRAVENOUS
  Filled 2015-08-16: qty 40

## 2015-08-16 NOTE — Progress Notes (Signed)
  Echocardiogram 2D Echocardiogram has been performed.  Leta JunglingCooper, [place] Agent M 08/16/2015, 2:51 PM

## 2015-08-16 NOTE — Consult Note (Signed)
PULMONARY / CRITICAL CARE MEDICINE   Name: Brad Mcgee MRN: [id number] DOB: 1986/03/04    ADMISSION DATE:  08/15/2015 CONSULTATION DATE:  08/16/2015  REFERRING MD:  Dr. Amada Jupiter  CHIEF COMPLAINT:  Left side weakness  HISTORY OF PRESENT ILLNESS:   Hx from chart.  30 yo male with slurred speech and Lt sided weakness.  This was associated with headache.  He was intubated for airway protection.  He had CT head which showed 3.7 x 1.9 x 2 cm Rt basal ganglia ICH which then increased to 4.5 x 1.9 x 3.4 cm ICH on f/u CT head.  His BP was 187/106.  PAST MEDICAL HISTORY :  He  has a past medical history of Hypertension.  PAST SURGICAL HISTORY: He has no significant surgical history  Allergies  Allergen Reactions  . Bee Venom Anaphylaxis  . Coconut Oil Anaphylaxis    No current facility-administered medications on file prior to encounter.   Current Outpatient Prescriptions on File Prior to Encounter  Medication Sig  . naproxen (NAPROSYN) 500 MG tablet Take 1 tablet (500 mg total) by mouth 2 (two) times daily with a meal.    FAMILY HISTORY:  His family history is significant for hypertension  SOCIAL HISTORY: He  reports that he has never smoked. He has never used smokeless tobacco. He reports that he does not drink alcohol or use illicit drugs.  REVIEW OF SYSTEMS:   Unable to obtain  SUBJECTIVE:   VITAL SIGNS: BP 179/114 mmHg  Pulse 98  Temp(Src) 98 F (36.7 C) (Oral)  Resp 18  Ht  (1.702 m)  Wt 298 lb (135.172 kg)  BMI 46.66 kg/m2  SpO2 100%  HEMODYNAMICS:    VENTILATOR SETTINGS: Vent Mode:  [-] PRVC FiO2 (%):  [70 %-100 %] 70 % Set Rate:  [16 bmp-25 bmp] 16 bmp Vt Set:  [600 mL] 600 mL PEEP:  [5 cmH20] 5 cmH20 Plateau Pressure:  [26 cmH20] 26 cmH20  INTAKE / OUTPUT:    PHYSICAL EXAMINATION: General:  In ICU Neuro:  RASS -3 HEENT:  Pupils pinpoint, ETT in place Cardiovascular:  Regular, no murmur Lungs:  No wheeze Abdomen:  Soft, non  tender Musculoskeletal:  No edema Skin:  No rashes  LABS:  BMET  Recent Labs Lab 08/15/15 2245  NA 137  K 3.9  CL 105  CO2 26  BUN 20  CREATININE 1.20  GLUCOSE 115*    Electrolytes  Recent Labs Lab 08/15/15 2245  CALCIUM 8.8*    CBC  Recent Labs Lab 08/15/15 2245  WBC 6.7  HGB 14.0  HCT 40.8  PLT 217    Coag's  Recent Labs Lab 08/15/15 2245  APTT 32  INR 0.98    Sepsis Markers No results for input(s): LATICACIDVEN, PROCALCITON, O2SATVEN in the last 168 hours.  ABG No results for input(s): PHART, PCO2ART, PO2ART in the last 168 hours.  Liver Enzymes  Recent Labs Lab 08/15/15 2245  AST 23  ALT 20  ALKPHOS 58  BILITOT 0.4  ALBUMIN 4.3    Cardiac Enzymes  Recent Labs Lab 08/15/15 2245  TROPONINI 0.05*    Glucose  Recent Labs Lab 08/15/15 2259  GLUCAP 126*    Imaging Ct Head Wo Contrast  08/16/2015  CLINICAL DATA:  Follow-up intracranial hemorrhage. EXAM: CT HEAD WITHOUT CONTRAST TECHNIQUE: Contiguous axial images were obtained from the base of the skull through the vertex without intravenous contrast. COMPARISON:  Yesterday at 2238 hour FINDINGS: Right basal gangliar hemorrhage measures  4.5 x 1.9 x 3.4 cm (volume = 15.1 cc), previously 3.7 x 1.9 x 2.0 cm (volume =7.3 cc). There is persistent mass-effect on the frontal horn of the right lateral ventricle. There is now all 2 mm right to left midline shift. There is progressive supratentorial sulcal effacement. Persistent basilar cistern effacement. No intraventricular extension. No new areas of hemorrhage are seen. IMPRESSION: Increased size of acute right basal ganglia hemorrhage, with increased mass effect. There is now 2 mm right to left midline shift. There is developing supratentorial sulcal effacement and persistent basilar cistern effacement concerning for developing cerebral edema. These results were called by telephone at the time of interpretation on 08/16/2015 at 2:10 am to Dr.  Marily Memos , who verbally acknowledged these results. Electronically Signed   By: Rubye Oaks M.D.   On: 08/16/2015 02:10   Dg Chest Portable 1 View  08/16/2015  CLINICAL DATA:  Endotracheal tube placement.  Initial encounter. EXAM: PORTABLE CHEST 1 VIEW COMPARISON:  None. FINDINGS: The patient's endotracheal tube is seen ending 2-3 cm above the carina. The lungs are mildly hypoexpanded. Vascular crowding and vascular congestion are seen. Increased interstitial markings raise concern for mild pulmonary edema, though pneumonia could have a similar appearance. There is no evidence of pleural effusion or pneumothorax. The cardiomediastinal silhouette is borderline normal in size. No acute osseous abnormalities are seen. IMPRESSION: 1. Endotracheal tube seen ending 2-3 cm above the carina. 2. Lungs mildly hypoexpanded. Vascular congestion noted. Increased interstitial markings raise concern for mild pulmonary edema, though pneumonia could have a similar appearance. Electronically Signed   By: Roanna Raider M.D.   On: 08/16/2015 02:07   Ct Head Code Stroke W/o Cm  08/15/2015  CLINICAL DATA:  Left-sided weakness.  Code stroke. EXAM: CT HEAD WITHOUT CONTRAST TECHNIQUE: Contiguous axial images were obtained from the base of the skull through the vertex without intravenous contrast. COMPARISON:  None. FINDINGS: Acute right basal ganglia hemorrhage measures 3.7 x 1.9 x 2.0 cm (volume = 7.3 cc). There is mild edema and mass effect on the frontal horn of the right lateral ventricle but no significant midline shift. No intraventricular extension. No additional acute hemorrhage. There is crowding of the basilar cisterns. No acute ischemia elsewhere. The calvarium is intact. Included paranasal sinuses and mastoid air cells are well aerated. IMPRESSION: Acute right basal ganglia hemorrhage measuring 3.7 x 1.9 x 2.0 cm. Mild surrounding edema and mass effect, no midline shift. There is crowding of the basilar cisterns.  Critical Value/emergent results were called by telephone at the time of interpretation on 08/15/2015 at 10:56 pm to Dr. Clayborne Dana, who verbally acknowledged these results. Electronically Signed   By: Rubye Oaks M.D.   On: 08/15/2015 22:56     STUDIES:  5/30 CT head >> 3.7 x 1.9 x 2 cm Rt basal ganglia ICH  CULTURES:  ANTIBIOTICS:  SIGNIFICANT EVENTS: 5/30 Admit 5/31 Start 3% NS  LINES/TUBES: 3/30 ETT >>  3/31 CVL >>   DISCUSSION: 30 yo male with headache, slurred speech and Lt side weakness from Rt basal ganglia ICH.  ASSESSMENT / PLAN:  NEUROLOGIC A:   Rt basal ganglia ICH. P:   RASS goal: -2 Rest per neuro  PULMONARY A: Compromised airway in setting of ICH. High risk for OSA. P:   Full vent support F/u CXR, ABG  CARDIOVASCULAR A:  HTN urgency. Mild levated troponin. P:  Cardene to keep SBP < 160 F/u cardiac enzymes, ECG, Echo Hold outpt lisinopril  RENAL A:  Medically induced hypernatremia. P:   Goal Na 150 to 155 per neuro Monitor renal fx, urine outpt  GASTROINTESTINAL A:   Nutrition. P:   Tube feeds Protonix for SUP  HEMATOLOGIC A:   No acute issues. P:  F/u CBC SCDs  INFECTIOUS A:   No evidence for infection. P:   Monitor clinically  ENDOCRINE A:   No acute issues.   P:   Monitor blood sugar on BMET   Updated pt's family at bedside.  D/w Dr. Amada JupiterKirkpatrick.  CC time 37 minutes.  Coralyn HellingVineet Jazminn Pomales, MD [facility] Pulmonary/Critical Care 08/16/2015, 4:33 AM Pager:  [phone] After 3pm call: [phone]

## 2015-08-16 NOTE — Progress Notes (Signed)
STROKE TEAM PROGRESS NOTE   HISTORY OF PRESENT ILLNESS (per record) Brad Mcgee is a 30 y.o. male with a history of hypertension who presents with ICH to [facility]. He was apparently normal at 9:30 pm 08/15/2015, then developed witnessed onset left sided weakness at that time. He was taken [facility] where he was found to have a basal ganglia ICH. Following the initial CT scan, he had subsequent worsening with depressed mental status. Due to this, he required intubation. He is a history of long standing hypertension. Intracerebral Hemorrhage (ICH) ScoreTotal: 1. Patient was not administered IV t-PA secondary to [place]. He was transferred to [facility] and admitted to the neuro ICU for further evaluation and treatment.he was intubated and sedated   SUBJECTIVE (INTERVAL HISTORY) Patients Mother at bedside, known history of HTN, but recently started taking medication. Family hx of HTN, even in 31 y o sister. Mother denies any other significant medical hx, denies illicit drug use in pt. He is sedated on propofol, responds to pain.BP controlled on cardene drip.   OBJECTIVE Temp:  [98 F (36.7 C)-98.2 F (36.8 C)] 98.1 F (36.7 C) (05/31 0415) Pulse Rate:  [74-117] 79 (05/31 0700) Cardiac Rhythm:  [-] Normal sinus rhythm (05/31 0600) Resp:  [11-30] 17 (05/31 0700) BP: (102-187)/(55-138) 102/57 mmHg (05/31 0700) SpO2:  [94 %-100 %] 100 % (05/31 0700) FiO2 (%):  [70 %-100 %] 70 % (05/31 0600) Weight:  [135.172 kg (298 lb)] 135.172 kg (298 lb) (05/30 2254)  CBC:  Recent Labs Lab 08/15/15 2245 08/16/15 0417  WBC 6.7 8.0  NEUTROABS 3.4  --   HGB 14.0 14.2  HCT 40.8 42.2  MCV 92.9 92.5  PLT 217 673    Basic Metabolic Panel:  Recent Labs Lab 08/15/15 2245 08/16/15 0417  NA 137 136  K 3.9 4.4  CL 105 105  CO2 26 25  GLUCOSE 115* 97  BUN 20 13  CREATININE 1.20 1.04  CALCIUM 8.8* 8.8*    Lipid Panel:    Component Value Date/Time   TRIG 148 08/16/2015 0435   HgbA1c: No results  found for: HGBA1C Urine Drug Screen:    Component Value Date/Time   LABOPIA NONE DETECTED 08/16/2015 0033   COCAINSCRNUR NONE DETECTED 08/16/2015 0033   LABBENZ NONE DETECTED 08/16/2015 0033   AMPHETMU NONE DETECTED 08/16/2015 0033   THCU NONE DETECTED 08/16/2015 0033   LABBARB NONE DETECTED 08/16/2015 0033      IMAGING  Ct Head Wo Contrast  08/16/2015  CLINICAL DATA:  Follow-up intracranial hemorrhage. EXAM: CT HEAD WITHOUT CONTRAST TECHNIQUE: Contiguous axial images were obtained from the base of the skull through the vertex without intravenous contrast. COMPARISON:  Yesterday at 2238 hour FINDINGS: Right basal gangliar hemorrhage measures 4.5 x 1.9 x 3.4 cm (volume = 15.1 cc), previously 3.7 x 1.9 x 2.0 cm (volume =7.3 cc). There is persistent mass-effect on the frontal horn of the right lateral ventricle. There is now all 2 mm right to left midline shift. There is progressive supratentorial sulcal effacement. Persistent basilar cistern effacement. No intraventricular extension. No new areas of hemorrhage are seen. IMPRESSION: Increased size of acute right basal ganglia hemorrhage, with increased mass effect. There is now 2 mm right to left midline shift. There is developing supratentorial sulcal effacement and persistent basilar cistern effacement concerning for developing cerebral edema. These results were called by telephone at the time of interpretation on 08/16/2015 at 2:10 am to Dr. Merrily Pew , who verbally acknowledged these results.  Electronically Signed   By: Jeb Levering M.D.   On: 08/16/2015 02:10   Dg Chest Port 1 View  08/16/2015  CLINICAL DATA:  Central line placement. EXAM: PORTABLE CHEST 1 VIEW COMPARISON:  Earlier this day at 0139 hour FINDINGS: Endotracheal tube is 3.1 cm from the carina. Enteric tube in place. Tip of the right central line in the mid SVC. No pneumothorax. Low lung volumes. Enlargement of the cardiac silhouette is unchanged. Diminished vascular congestion  and pulmonary edema from prior exam. Persistent perihilar opacities. IMPRESSION: 1. Tip of the right central line in the mid SVC.  No pneumothorax. 2. Endotracheal and enteric tubes remain in place. 3. Unchanged enlargement of the cardiac silhouette. Decreased vascular congestion and edema from prior. Electronically Signed   By: Jeb Levering M.D.   On: 08/16/2015 06:54   Dg Chest Portable 1 View  08/16/2015  CLINICAL DATA:  Endotracheal tube placement.  Initial encounter. EXAM: PORTABLE CHEST 1 VIEW COMPARISON:  None. FINDINGS: The patient's endotracheal tube is seen ending 2-3 cm above the carina. The lungs are mildly hypoexpanded. Vascular crowding and vascular congestion are seen. Increased interstitial markings raise concern for mild pulmonary edema, though pneumonia could have a similar appearance. There is no evidence of pleural effusion or pneumothorax. The cardiomediastinal silhouette is borderline normal in size. No acute osseous abnormalities are seen. IMPRESSION: 1. Endotracheal tube seen ending 2-3 cm above the carina. 2. Lungs mildly hypoexpanded. Vascular congestion noted. Increased interstitial markings raise concern for mild pulmonary edema, though pneumonia could have a similar appearance. Electronically Signed   By: Garald Balding M.D.   On: 08/16/2015 02:07   Dg Abd Portable 1v  08/16/2015  CLINICAL DATA:  Orogastric tube placement. EXAM: PORTABLE ABDOMEN - 1 VIEW COMPARISON:  None. FINDINGS: There is an enteric tube with 1 small chole over the stomach and tip right of midline likely over the distal stomach or proximal duodenum. Bowel gas pattern is nonobstructive. Remainder of the exam is unremarkable. IMPRESSION: No acute findings per Enteric tube with tip in the right upper quadrant likely over the distal stomach or proximal duodenum. Electronically Signed   By: Marin Olp M.D.   On: 08/16/2015 07:20   Ct Head Code Stroke W/o Cm  08/15/2015  CLINICAL DATA:  Left-sided weakness.   Code stroke. EXAM: CT HEAD WITHOUT CONTRAST TECHNIQUE: Contiguous axial images were obtained from the base of the skull through the vertex without intravenous contrast. COMPARISON:  None. FINDINGS: Acute right basal ganglia hemorrhage measures 3.7 x 1.9 x 2.0 cm (volume = 7.3 cc). There is mild edema and mass effect on the frontal horn of the right lateral ventricle but no significant midline shift. No intraventricular extension. No additional acute hemorrhage. There is crowding of the basilar cisterns. No acute ischemia elsewhere. The calvarium is intact. Included paranasal sinuses and mastoid air cells are well aerated. IMPRESSION: Acute right basal ganglia hemorrhage measuring 3.7 x 1.9 x 2.0 cm. Mild surrounding edema and mass effect, no midline shift. There is crowding of the basilar cisterns. Critical Value/emergent results were called by telephone at the time of interpretation on 08/15/2015 at 10:56 pm to Dr. Dayna Barker, who verbally acknowledged these results. Electronically Signed   By: Jeb Levering M.D.   On: 08/15/2015 22:56   PHYSICAL EXAM GENERAL- Intubated, sedated, appears as stated age, not in any distress, Obese. HEENT- Atraumatic, normocephalic, Pupils constricted, reactive and equal, disconjugate, EOMI CARDIAC- RRR, no murmurs, rubs or gallops. RESP- Moving equal volumes of  air, and clear to auscultation bilaterally, anteriorly ABDOMEN- Soft, obese EXTREMITIES- pulse 2+, symmetric, no pedal edema. SKIN- Warm, dry, No rash or lesion.   NEUROLOGICAL EXAM : intubated and sedated- eyes closed, Pupils 2 mm reactive. Fundi nor visualized.Dolls eye present, dysconjugate gaze with mild exotropia left eye.Corneal reflex intact- but more brisk on the right, no obvious facial assym, moving RUE, RLE, LLE to pain, withdraws to pain, some purposeful movement. Minimal LUE movement to pain ASSESSMENT/PLAN Brad Mcgee is a 30 y.o. male with history of hypertension presenting with L sided  weakness. CT showed a L basal ganglia hemorrhage, with change in mental status, pt had repeat Ct head which showed increase size of hrr, and increased mass effect, pt and was subsequently intubated.  Stroke:  Non-dominant right basal ganglia hemorrhage secondary to hypertension with resultant neuro worsening, hemorrhage extension and cerebral edema. Volume of blood calculated- 25cc.  Resultant  L hemiparesis, VDRF, HA, dysphagia  Repeat CT this afternoon- Stable hematoma.   CTA Pending    MRI/MRA  2D Echo  pending   SCDs for VTE prophylaxis  Diet NPO time specified, consider starting tube feeds.  No antithrombotic prior to admission  Ongoing aggressive stroke risk factor management  Therapy recommendations:  pending   Disposition:  pending   Acute respiratory failure  Secondary to hemorrhage  Intubated in the ED  CCM managing vent, On propofol and fentanyl  Cardiomegaly and abnormal EKG  Seen on chest xray, most likely related to HTN heart dx  Echo pending  Flat elevated troponin, t wave inversions in inferior lat leads.  Might need cardiac eval at some point.  Induced Hypernatremia  To prevent cerebral edema  CL placed and started on 3% NA drip   Na 136 >141.  Na goal - 150- 155.  Dysphagia  secondary to stroke  Place NG, tube feeds per PCCM.  Hypertensive Emergency  BP 182/124 on arrival in setting of acute neurologic symptoms  BP < 160 since 0400  On cardene  SBP goal < 160  Other Stroke Risk Factors  Morbid Obesity, Body mass index is 46.66 kg/(m^2).   Other [facility] day # 0  Bing Neighbors, MD. IMTS- resident. 08/16/2015 2:29 PM  I have personally examined this patient, reviewed notes, independently viewed imaging studies, participated in medical decision making and plan of care. I have made any additions or clarifications directly to the above note. Agree with note above. He presented with right basal ganglia  hemorrhage likely due to  Malignant hypertension and developed hematoma expansion but neurological worsening requiring intubation.  He remains at risk for neurological worsening, hydrocephalus, cytotoxic cerebral edema and needs ongoing ventilatory support,, hypertonic saline to control cerebral edema and aggressive blood pressure control.  I had a long discussion of the bedside with the patient's mother about his condition, prognosis, treatment plan and answered questions. Plan repeat CT angiogram tomorrow morning and keep sodium 150-155 with hypertonic saline and blood pressure below 696 systolic for 24 hours This patient is critically ill and at significant risk of neurological worsening, death and care requires constant monitoring of vital signs, hemodynamics,respiratory and cardiac monitoring, extensive review of multiple databases, frequent neurological assessment, discussion with family, other specialists and medical decision making of high complexity.I have made any additions or clarifications directly to the above note.This critical care time does not reflect procedure time, or teaching time or supervisory time of PA/NP/Med Resident etc but could involve care discussion time.  I spent 50 minutes of neurocritical care time  in the care of  this patient.     Antony Contras, MD Medical Director [facility] Pager: [phone] 08/16/2015 3:00 PM

## 2015-08-16 NOTE — Procedures (Signed)
Central Venous Catheter Insertion Procedure Note [name] M Grandt [id number] 04/09/1985  Procedure: Insertion of Central Venous Catheter Indications: Drug and/or fluid administration  Procedure Details Consent: Risks of procedure as well as the alternatives and risks of each were explained to the (patient/caregiver).  Consent for procedure obtained. Time Out: Verified patient identification, verified procedure, site/side was marked, verified correct patient position, special equipment/implants available, medications/allergies/relevent history reviewed, required imaging and test results available.  Performed  Maximum sterile technique was used including antiseptics, cap, gloves, gown, hand hygiene, mask and sheet. Skin prep: Chlorhexidine; local anesthetic administered A antimicrobial bonded/coated triple lumen catheter was placed in the right internal jugular vein using the Seldinger technique.  Evaluation Blood flow good Complications: No apparent complications Patient did tolerate procedure well. Chest X-ray ordered to verify placement.  CXR: pending.  Placed with u/s guidance.  Coralyn HellingVineet Devonia Farro, MD [facility] Pulmonary/Critical Care 08/16/2015, 5:22 AM Pager:  [phone] After 3pm call: [phone]

## 2015-08-16 NOTE — H&P (Addendum)
Neurology H&P  CC: left sided weakness  History is obtained from:chart review  HPI: Brad Mcgee is a 30 y.o. male with a history of hypertension who presents with ICH to [place]. He was apparently normal at 9:30 pm, then developed witnessed onset left sided weakness at that time. He is taken [facility] where he was found to have a basal ganglia ICH. Following the initial CT scan, he had subsequent worsening with depressed mental status. Due to this, he required intubation.   He is a history of hypertension.  LKW: 9:30pm tpa given?: no, ich  Intracerebral Hemorrhage (ICH) Score  Glascow Coma Score  5-12 +1  Age >/= 80 no 0  ICH volume >/= 30ml  no 0  IVH no 0  Infratentorial origin no 0 Total:  1  ROS: Unable to obtain due to altered mental status.   Past Medical History  Diagnosis Date  . Hypertension      Family History  Problem Relation Age of Onset  . Hypertension Mother      Social History:  reports that he has never smoked. He has never used smokeless tobacco. He reports that he does not drink alcohol or use illicit drugs.   Exam: Current vital signs: BP 107/57 mmHg  Pulse 80  Temp(Src) 98.1 F (36.7 C) (Axillary)  Resp 16  Ht  (1.702 m)  Wt 135.172 kg (298 lb)  BMI 46.66 kg/m2  SpO2 100% Vital signs in last 24 hours: Temp:  [98 F (36.7 C)-98.2 F (36.8 C)] 98.1 F (36.7 C) (05/31 0415) Pulse Rate:  [74-117] 80 (05/31 0645) Resp:  [11-30] 16 (05/31 0645) BP: (107-187)/(55-138) 107/57 mmHg (05/31 0645) SpO2:  [94 %-100 %] 100 % (05/31 0645) FiO2 (%):  [70 %-100 %] 70 % (05/31 0600) Weight:  [135.172 kg (298 lb)] 135.172 kg (298 lb) (05/30 2254)   Physical Exam  Constitutional: Appears well-developed and well-nourished.  Psych: Affect appropriate to situation Eyes: No scleral injection HENT: No OP obstrucion Head: Normocephalic.  Cardiovascular: Normal rate and regular rhythm.  Respiratory: Effort normal and breath sounds normal  to anterior ascultation GI: Soft.  No distension. There is no tenderness.  Skin: WDI  Neuro: Mental Status: With sedation paused, he does wake up and follow commands, showing thumbs up on the right wiggling toes. Cranial Nerves: II: Does not blink to threat Pupils are small but equal, round, and reactive to light.   III,IV, VI: he attempts to look to the right, crossing midline slightly but does not look to the left at all. I am unable to elicit doll's eye response. I'm not sure if this is an artifact of sedation (too awake) or an actual finding V: VII: Corneals intact VIII: hearing is intact to voice X: Cough intact Motor: He does not move his left side, he follows commands with good strength on the right. Sensory: Responds to noxious stimuli 4 Cerebellar: Does not perform.    I have reviewed labs in epic and the results pertinent to this consultation are: CMP-unremarkable Borderline troponin  I have reviewed the images obtained: CT head-enlarging right basal ganglia hemorrhage. The hemorrhage is still relatively small, however he has very little room to spare intracranially with effaced basilar cisterns.  Impression: 30 year old male with a history of hypertension who presents with basal ganglia hemorrhage, likely a complication of hypertension. Given his worsening, he was given mannitol in the ED at [facility]. I am concerned regarding his effaced basilar cisterns and will  start him on 3%. I discussed this case with neurosurgery (Dr. Bevely Palmeritty) he does not feel that he would be a surgical candidate.  Recommendations: 1) Admit to ICU 2) no antiplatelets or anticoagulants 3) blood pressure control with goal systolic <140 4) Frequent neuro checks 5) repeat head CT in the morning 6) PT,OT,ST 7) 3% normal saline 8) appreciate CCM assistance 9) trench troponins 10) nicardipine for blood pressure    This patient is critically ill and at significant risk of neurological worsening,  death and care requires constant monitoring of vital signs, hemodynamics,respiratory and cardiac monitoring, neurological assessment, discussion with family, other specialists and medical decision making of high complexity. I spent 50 minutes of neurocritical care time  in the care of  this patient.  Ritta SlotMcNeill Solymar Grace, MD [facility] [phone]  If 7pm- 7am, please page neurology on call as listed in AMION. 08/16/2015  6:54 AM

## 2015-08-16 NOTE — ED Notes (Signed)
[facility] [place]

## 2015-08-16 NOTE — Consult Note (Signed)
CC:  Chief Complaint  Patient presents with  . Code Stroke    HPI: Brad Mcgee is a 30 y.o. male with right basal ganglia hemorrhage.  Developed acute onset of headache and left hemiparesis last night.  His mental status deteriorated and he required intubation for airway protection.  PMH: Past Medical History  Diagnosis Date  . Hypertension     PSH: History reviewed. No pertinent past surgical history.  SH: Social History  Substance Use Topics  . Smoking status: Never Smoker   . Smokeless tobacco: Never Used  . Alcohol Use: No    MEDS: Prior to Admission medications   Medication Sig Start Date End Date Taking? Authorizing Provider  lisinopril (PRINIVIL,ZESTRIL) 10 MG tablet Take 10 mg by mouth daily.   Yes Historical Provider, MD  naproxen (NAPROSYN) 500 MG tablet Take 1 tablet (500 mg total) by mouth 2 (two) times daily with a meal. 08/01/15  Yes Lutricia FeilWilliam P Roemer, PA-C    ALLERGY: Allergies  Allergen Reactions  . Bee Venom Anaphylaxis  . Coconut Oil Anaphylaxis    ROS: Review of Systems  Unable to perform ROS: intubated    NEUROLOGIC EXAM: Intubated, opens eyes to painful stimulus CN intact Dense left hemiparesis. Responds to painful stimulus on right side  IMAGING: Right basal ganglia hemorrhage with interval enlargement.  IMPRESSION: - 30 y.o. male with right basal ganglia hemorrhage.  PLAN: - No role for surgical intervention - Medical management as already being performed by neurology service - Would get CTA to rule out underlying structural lesion

## 2015-08-16 NOTE — ED Notes (Signed)
[facility] here with pt, preparing for transport

## 2015-08-16 NOTE — Progress Notes (Signed)
Initial Nutrition Assessment  DOCUMENTATION CODES:   Morbid obesity  INTERVENTION:   If remains intubated recommend starting Adult TF Protocol.   NUTRITION DIAGNOSIS:   Inadequate oral intake related to inability to eat as evidenced by NPO status.  GOAL:   Provide needs based on ASPEN/SCCM guidelines  MONITOR:   Vent status, TF tolerance  REASON FOR ASSESSMENT:   Ventilator    ASSESSMENT:   30 yo male with slurred speech and Lt sided weakness. This was associated with headache. He was intubated for airway protection. He had CT head which showed 3.7 x 1.9 x 2 cm Rt basal ganglia ICH which then increased to 4.5 x 1.9 x 3.4 cm ICH on f/u CT head. His BP was 187/106.  Patient is currently intubated on ventilator support MV: 11.4 L/min Temp (24hrs), Avg:98.2 F (36.8 C), Min:98 F (36.7 C), Max:98.5 F (36.9 C)  Propofol: 40.6 ml/hr provides: 1071 kcal per day from lipid Medications reviewed and include: senokot-s, 3% Labs reviewed: TG 148 OG tube Will be difficult to meet protein needs due to high rates of propofol.  Pt discussed during ICU rounds and with RN.  NFPE completed no findings.   Diet Order:  Diet NPO time specified  Skin:  Reviewed, no issues  Last BM:  unknown  Height:   Ht Readings from Last 1 Encounters:  08/15/15 5\' 7"  (1.702 m)    Weight:   Wt Readings from Last 1 Encounters:  08/15/15 298 lb (135.172 kg)    Ideal Body Weight:  67.2 kg  BMI:  Body mass index is 46.66 kg/(m^2).  Estimated Nutritional Needs:   Kcal:  [id number]  Protein:  >/= 168 grams  Fluid:  > 1.5 L/day  EDUCATION NEEDS:   No education needs identified at this time  Kendell BaneHeather Deovion Batrez RD, LDN, CNSC [phone] Pager [phone] After Hours Pager

## 2015-08-16 NOTE — Consult Note (Deleted)
Neurology H&P  CC: left sided weakness  History is obtained from:chart review  HPI: Brad Mcgee is a 30 y.o. male with a history of hypertension who presents with ICH to [place]. He was apparently normal at 9:30 pm, then developed witnessed onset left sided weakness at that time. He is taken [facility] where he was found to have a basal ganglia ICH. Following the initial CT scan, he had subsequent worsening with depressed mental status. Due to this, he required intubation.   He is a history of hypertension.  LKW: 9:30pm tpa given?: no, ich   ROS: Unable to obtain due to altered mental status.   Past Medical History  Diagnosis Date  . Hypertension      Family History  Problem Relation Age of Onset  . Hypertension Mother      Social History:  reports that he has never smoked. He has never used smokeless tobacco. He reports that he does not drink alcohol or use illicit drugs.   Exam: Current vital signs: BP 179/114 mmHg  Pulse 98  Temp(Src) 98 F (36.7 C) (Oral)  Resp 18  Ht  (1.702 m)  Wt 135.172 kg (298 lb)  BMI 46.66 kg/m2  SpO2 100% Vital signs in last 24 hours: Temp:  [98 F (36.7 C)-98.2 F (36.8 C)] 98 F (36.7 C) (05/30 2349) Pulse Rate:  [74-117] 98 (05/31 0400) Resp:  [11-30] 18 (05/31 0400) BP: (113-187)/(55-138) 179/114 mmHg (05/31 0400) SpO2:  [94 %-100 %] 100 % (05/31 0400) FiO2 (%):  [70 %-100 %] 70 % (05/31 0353) Weight:  [135.172 kg (298 lb)] 135.172 kg (298 lb) (05/30 2254)   Physical Exam  Constitutional: Appears well-developed and well-nourished.  Psych: Affect appropriate to situation Eyes: No scleral injection HENT: No OP obstrucion Head: Normocephalic.  Cardiovascular: Normal rate and regular rhythm.  Respiratory: Effort normal and breath sounds normal to anterior ascultation GI: Soft.  No distension. There is no tenderness.  Skin: WDI  Neuro: Mental Status: With sedation paused, he does wake up and follow commands,  showing thumbs up on the right wiggling toes. Cranial Nerves: II: Does not blink to threat Pupils are small but equal, round, and reactive to light.   III,IV, VI: he attempts to look to the right, crossing midline slightly but does not look to the left at all. I am unable to elicit doll's eye response. I'm not sure if this is an artifact of sedation (too awake) or an actual finding V: VII: Corneals intact VIII: hearing is intact to voice X: Cough intact Motor: He does not move his left side, he follows commands with good strength on the right. Sensory: Responds to noxious stimuli 4 Cerebellar: Does not perform.    I have reviewed labs in epic and the results pertinent to this consultation are: CMP-unremarkable Borderline troponin  I have reviewed the images obtained: CT head-enlarging right basal ganglia hemorrhage. The hemorrhage is still relatively small, however he has very little room to spare intracranially with effaced basilar cisterns.  Impression: 30 year old male with a history of hypertension who presents with basal ganglia hemorrhage, likely a complication of hypertension. Given his worsening, he was given mannitol in the ED at [facility]. I am concerned regarding his effaced basilar cisterns and will start him on 3%. I discussed this case with neurosurgery (Dr. Bevely Palmer) he does not feel that he would be a surgical candidate.  Recommendations: 1) Admit to ICU 2) no antiplatelets or anticoagulants 3) blood pressure  control with goal systolic <140 4) Frequent neuro checks 5) repeat head CT in the morning 6) PT,OT,ST 7) 3% normal saline 8) appreciate CCM assistance 9) trench troponins 10) nicardipine for blood pressure    This patient is critically ill and at significant risk of neurological worsening, death and care requires constant monitoring of vital signs, hemodynamics,respiratory and cardiac monitoring, neurological assessment, discussion with family, other  specialists and medical decision making of high complexity. I spent 50 minutes of neurocritical care time  in the care of  this patient.  Ritta SlotMcNeill Keene Gilkey, MD [facility] [phone]  If 7pm- 7am, please page neurology on call as listed in AMION. 08/16/2015  6:28 AM

## 2015-08-16 NOTE — Progress Notes (Signed)
Code stroke.   Beeper at 1025pm In room at 1033 Rad called at 1044 Soc at 1044

## 2015-08-17 ENCOUNTER — Inpatient Hospital Stay (HOSPITAL_COMMUNITY): Payer: 59

## 2015-08-17 DIAGNOSIS — E669 Obesity, unspecified: Secondary | ICD-10-CM

## 2015-08-17 DIAGNOSIS — I10 Essential (primary) hypertension: Secondary | ICD-10-CM

## 2015-08-17 DIAGNOSIS — I5032 Chronic diastolic (congestive) heart failure: Secondary | ICD-10-CM

## 2015-08-17 LAB — CBC
HCT: 38.6 % — ABNORMAL LOW (ref 39.0–52.0)
Hemoglobin: 12.2 g/dL — ABNORMAL LOW (ref 13.0–17.0)
MCH: 30.8 pg (ref 26.0–34.0)
MCHC: 31.6 g/dL (ref 30.0–36.0)
MCV: 97.5 fL (ref 78.0–100.0)
Platelets: 196 10*3/uL (ref 150–400)
RBC: 3.96 MIL/uL — ABNORMAL LOW (ref 4.22–5.81)
RDW: 13.7 % (ref 11.5–15.5)
WBC: 7.1 10*3/uL (ref 4.0–10.5)

## 2015-08-17 LAB — SODIUM
Sodium: 147 mmol/L — ABNORMAL HIGH (ref 135–145)
Sodium: 150 mmol/L — ABNORMAL HIGH (ref 135–145)
Sodium: 150 mmol/L — ABNORMAL HIGH (ref 135–145)

## 2015-08-17 LAB — GLUCOSE, CAPILLARY
Glucose-Capillary: 105 mg/dL — ABNORMAL HIGH (ref 65–99)
Glucose-Capillary: 95 mg/dL (ref 65–99)

## 2015-08-17 MED ORDER — VITAL HIGH PROTEIN PO LIQD
1000.0000 mL | ORAL | Status: DC
  Administered 2015-08-17 – 2015-08-20 (×4): 1000 mL

## 2015-08-17 MED ORDER — PRO-STAT SUGAR FREE PO LIQD
60.0000 mL | Freq: Every day | ORAL | Status: DC
  Administered 2015-08-17 – 2015-08-21 (×21): 60 mL
  Filled 2015-08-17 (×19): qty 60

## 2015-08-17 MED ORDER — ADULT MULTIVITAMIN W/MINERALS CH
1.0000 | ORAL_TABLET | Freq: Every day | ORAL | Status: DC
  Administered 2015-08-17 – 2015-08-24 (×8): 1
  Filled 2015-08-17 (×8): qty 1

## 2015-08-17 MED ORDER — FAMOTIDINE 40 MG/5ML PO SUSR
40.0000 mg | Freq: Every day | ORAL | Status: DC
  Administered 2015-08-17 – 2015-08-31 (×13): 40 mg
  Filled 2015-08-17 (×15): qty 5

## 2015-08-17 NOTE — Progress Notes (Signed)
PULMONARY / CRITICAL CARE MEDICINE   Name: Brad Mcgee MRN: [id number] DOB: 05/11/1985    ADMISSION DATE:  08/15/2015 CONSULTATION DATE:  08/16/2015  REFERRING MD:  Dr. Amada JupiterKirkpatrick  CHIEF COMPLAINT:  Left side weakness  HISTORY OF PRESENT ILLNESS:  Hx from chart. 30 yo male with slurred speech and Lt sided weakness.  This was associated with headache.  He was intubated for airway protection.  He had CT head which showed 3.7 x 1.9 x 2 cm Rt basal ganglia ICH which then increased to 4.5 x 1.9 x 3.4 cm ICH on f/u CT head.  His BP was 187/106.  SUBJECTIVE: Started on 3% HS yesterday.  REVIEW OF SYSTEMS:  Unable to obtain given intubation.  VITAL SIGNS: BP 152/89 mmHg  Pulse 114  Temp(Src) 99.7 F (37.6 C) (Axillary)  Resp 25  Ht 5\' 7"  (1.702 m)  Wt 298 lb (135.172 kg)  BMI 46.66 kg/m2  SpO2 100%  HEMODYNAMICS:    VENTILATOR SETTINGS: Vent Mode:  [-] PRVC FiO2 (%):  [40 %] 40 % Set Rate:  [16 bmp] 16 bmp Vt Set:  [600 mL] 600 mL PEEP:  [5 cmH20] 5 cmH20 Plateau Pressure:  [20 cmH20-25 cmH20] 23 cmH20  INTAKE / OUTPUT: I/O last 3 completed shifts: In: 5457.3 [I.V.:5457.3] Out: 5250 [Urine:5250]  PHYSICAL EXAMINATION: General:  Obese. No distress. Family at bedside. Neuro:  Pupils pinpoint and symmetric. No spontaneous movements. HEENT:  ETT in place. No scleral icterus or injection. Cardiovascular:  Regular rate & rhythm. No appreciable JVD given body habitus. Lungs:  Distant breath sounds. Symmetric chest rise on vent. Abdomen:  Soft. Protuberant. Normal bowel sounds. Integument:  Warm & dry. No rash on exposed skin.  LABS:  BMET  Recent Labs Lab 08/15/15 2245 08/16/15 0417  08/16/15 1800 08/16/15 2300 08/17/15 0500  NA 137 136  < > 141 144 147*  K 3.9 4.4  --   --   --   --   CL 105 105  --   --   --   --   CO2 26 25  --   --   --   --   BUN 20 13  --   --   --   --   CREATININE 1.20 1.04  --   --   --   --   GLUCOSE 115* 97  --   --   --   --   < >  = values in this interval not displayed.  Electrolytes  Recent Labs Lab 08/15/15 2245 08/16/15 0417  CALCIUM 8.8* 8.8*    CBC  Recent Labs Lab 08/15/15 2245 08/16/15 0417 08/17/15 0500  WBC 6.7 8.0 7.1  HGB 14.0 14.2 12.2*  HCT 40.8 42.2 38.6*  PLT 217 200 196    Coag's  Recent Labs Lab 08/15/15 2245  APTT 32  INR 0.98    Sepsis Markers No results for input(s): LATICACIDVEN, PROCALCITON, O2SATVEN in the last 168 hours.  ABG  Recent Labs Lab 08/16/15 0434  PHART 7.333*  PCO2ART 45.2*  PO2ART 73.9*    Liver Enzymes  Recent Labs Lab 08/15/15 2245  AST 23  ALT 20  ALKPHOS 58  BILITOT 0.4  ALBUMIN 4.3    Cardiac Enzymes  Recent Labs Lab 08/15/15 2245 08/16/15 0435 08/16/15 1030  TROPONINI 0.05* 0.06* 0.06*    Glucose  Recent Labs Lab 08/15/15 2259 08/16/15 0423  GLUCAP 126* 113*    Imaging Ct Head Wo Contrast  08/16/2015  CLINICAL DATA:  Followup intracranial hemorrhage EXAM: CT HEAD WITHOUT CONTRAST TECHNIQUE: Contiguous axial images were obtained from the base of the skull through the vertex without intravenous contrast. COMPARISON:  Earlier same day FINDINGS: Intraparenchymal hematoma in the right basal ganglia measures 5.2 x 1.9 x 3.0 cm (volume = 15.4 cc). This is not significantly changed since the previous exam 1 volume was calculated at 15.1 cc. Surrounding edema is slightly better defined. Mild mass effect with some compression of the right lateral ventricle and right-to-left shift unchanged at 2 mm. No evidence of intraventricular or subarachnoid penetration. The remainder the brain appears normal. Calvarium is normal. No significant sinus disease. IMPRESSION: No significant change since the previous study. Right basal ganglia hematoma volume 15.4 cc. Slightly better defined surrounding edema. Electronically Signed   By: Paulina Fusi M.D.   On: 08/16/2015 12:20   Dg Chest Port 1 View  08/17/2015  CLINICAL DATA:  Respiratory failure  . EXAM: PORTABLE CHEST 1 VIEW COMPARISON:  08/16/2015 . FINDINGS: Endotracheal tube, NG tube, right IJ line in stable position. Stable cardiomegaly. Interval increase in pulmonary interstitial markings consistent with congestive heart failure with interstitial edema. Small left pleural effusion cannot be excluded. No pneumothorax . IMPRESSION: 1. Lines and tubes in stable position . 2. Cardiomegaly with increased interstitial markings consistent with congestive heart failure. Small left pleural effusion cannot be excluded. Electronically Signed   By: Maisie Fus  Register   On: 08/17/2015 07:46    STUDIES:  5/30 CT Head: 3.7 x 1.9 x 2 cm Rt basal ganglia ICH 5/31 CT Head:  No significant change. 5.2x1.9x3.0cm right basal ganglia hematoma. Mild mass effect. 5/31 TTE: Severe concentric LVH with EF 50-55%. No regional wall motion abnormalities. Grade 2 diastolic dysfunction. LA & RA normal in size. RV normal in size and function. No aortic stenosis or regurgitation. Aortic root normal in size. Trivial mitral regurgitation without stenosis. No pulmonic regurgitation or stenosis. No tricuspid regurgitation. No pericardial effusion. 6/01 Port CXR:  Increased bilateral interstitial markings. Unable to appreciate pleural effusion. ETT & CVL in stable position.  MICROBIOLOGY: MRSA PCR 5/31:  Negative   ANTIBIOTICS: None.  SIGNIFICANT EVENTS: 5/30 Admit 5/31 Start 3% NS  LINES/TUBES: OETT 7.5 5/31 >> R IJ CVL 5/31 >> OGT 5/31 >> FOLEY 5/31 >> PIV x3  ASSESSMENT / PLAN:  NEUROLOGIC A:   R Basal Ganglia ICH  P:   Management per Neurology RASS goal: -2 Propofol gtt Versed IV prn Fentanyl gtt & IV prn 3% HS Saline IV per Neurology  PULMONARY A: Compromised Airway - Secondary to ICH. Increasing Pulmonary Edema - Secondary to Diastolic CHF & 3% HS. High risk for OSA  P:   Full vent support Holding on SBT until mental status improves  CARDIOVASCULAR A:  Hypertensive Emergency Elevated  Troponin I - Likely demand ischemia. No wall motion abnormalities on TTE. Grade 2 Diastolic CHF   P:  Vitals per Unit Protocol Monitoring on telemetry Cardene to keep SBP < 160 Labetalol IV prn Hold outpt lisinopril  RENAL A:   Hypernatremia - Medically induced.  P:   Goal Na 150 - 155 per Neuro Trending electrolytes & renal function daily Monitoring Na q6hr Following UOP with Foley  GASTROINTESTINAL A:   Nutrition.  P:   Tube feeds Protonix IV daily  HEMATOLOGIC A:   No acute issues.  P:  Trending cell counts daily w/ CBC SCDs for prophylaxis  INFECTIOUS A:   No acute issues.  P:  Monitor for fever & leukocytosis  ENDOCRINE A:   No acute issues.    P:   Monitor blood sugar on daily labs.  FAMILY UPDATES:  Father updated at bedside by Dr. Jamison Neighbor 6/1.  TODAY'S SUMMARY:  30 yo male with headache, slurred speech and Lt side weakness from Rt basal ganglia ICH. Continuing on treatment per neurology protocol. Initiating tube feedings. Family updated at bedside today. Suspect patient is developing some mild acute diastolic congestive heart failure given 3% hypertonic saline. Continuing full vent support for now.  I have spent a total of 37 minutes of critical care time today caring for the patient and reviewing the patient's electronic medical record.  Donna Christen Jamison Neighbor, M.D. [facility] Pager:  [phone] After 3pm or if no response, call [phone] 8:32 AM 08/17/2015

## 2015-08-17 NOTE — Progress Notes (Signed)
Nutrition Follow-up  DOCUMENTATION CODES:   Morbid obesity  INTERVENTION:   Vital High Protein @ 10 ml/hr 60 ml Prostat five times per day Provides: 1240 kcal, 171 grams protein, and 200 ml H2O.  TF regimen and propofol at current rate providing 2311 total kcal/day   NUTRITION DIAGNOSIS:   Inadequate oral intake related to inability to eat as evidenced by NPO status. Ongoing.   GOAL:   Provide needs based on ASPEN/SCCM guidelines Progressing.   MONITOR:   Vent status, TF tolerance  REASON FOR ASSESSMENT:   Consult Enteral/tube feeding initiation and management  ASSESSMENT:   30 yo male with slurred speech and Lt sided weakness. This was associated with headache. He was intubated for airway protection. He had CT head which showed 3.7 x 1.9 x 2 cm Rt basal ganglia ICH which then increased to 4.5 x 1.9 x 3.4 cm ICH on f/u CT head. His BP was 187/106.  Patient is currently intubated on ventilator support Temp (24hrs), Avg:99.5 F (37.5 C), Min:98.9 F (37.2 C), Max:99.7 F (37.6 C)  Propofol: 40.5 ml/hr = 1071 kcal per day from lipid OG tube Medications reviewed and include: senokot-s, 3% saline Labs reviewed CBG's: 141-150  Diet Order:  Diet NPO time specified  Skin:  Reviewed, no issues  Last BM:  6/1  Height:   Ht Readings from Last 1 Encounters:  08/15/15 5\' 7"  (1.702 m)    Weight:   Wt Readings from Last 1 Encounters:  08/15/15 298 lb (135.172 kg)    Ideal Body Weight:  67.2 kg  BMI:  Body mass index is 46.66 kg/(m^2).  Estimated Nutritional Needs:   Kcal:  [id number]  Protein:  >/= 168 grams  Fluid:  > 1.5 L/day  EDUCATION NEEDS:   No education needs identified at this time  Kendell BaneHeather Erica Richwine RD, LDN, CNSC [phone] Pager [phone] After Hours Pager

## 2015-08-17 NOTE — Progress Notes (Signed)
STROKE TEAM PROGRESS NOTE   SUBJECTIVE (INTERVAL HISTORY) Patient remains sedated and intubated. Neurologically stable. Blood pressure has been adequately controlled. He remains restless when sedation is off. He is on hypertonic saline and serum sodium this morning is yet 147 .he has mild congestive heart failure and CCM is managing   OBJECTIVE Temp:  [98.9 F (37.2 C)-99.7 F (37.6 C)] 99.7 F (37.6 C) (06/01 0800) Pulse Rate:  [78-115] 93 (06/01 0912) Cardiac Rhythm:  [-] Sinus tachycardia (06/01 0800) Resp:  [16-33] 18 (06/01 0912) BP: (102-155)/(57-97) 131/81 mmHg (06/01 0912) SpO2:  [95 %-100 %] 100 % (06/01 0912) FiO2 (%):  [40 %] 40 % (06/01 0912)  CBC:   Recent Labs Lab 08/15/15 2245 08/16/15 0417 08/17/15 0500  WBC 6.7 8.0 7.1  NEUTROABS 3.4  --   --   HGB 14.0 14.2 12.2*  HCT 40.8 42.2 38.6*  MCV 92.9 92.5 97.5  PLT 217 200 858    Basic Metabolic Panel:  Recent Labs Lab 08/15/15 2245 08/16/15 0417  08/16/15 2300 08/17/15 0500  NA 137 136  < > 144 147*  K 3.9 4.4  --   --   --   CL 105 105  --   --   --   CO2 26 25  --   --   --   GLUCOSE 115* 97  --   --   --   BUN 20 13  --   --   --   CREATININE 1.20 1.04  --   --   --   CALCIUM 8.8* 8.8*  --   --   --   < > = values in this interval not displayed.  Lipid Panel:     Component Value Date/Time   TRIG 148 08/16/2015 0435   HgbA1c: No results found for: HGBA1C Urine Drug Screen:     Component Value Date/Time   LABOPIA NONE DETECTED 08/16/2015 0033   COCAINSCRNUR NONE DETECTED 08/16/2015 0033   LABBENZ NONE DETECTED 08/16/2015 0033   AMPHETMU NONE DETECTED 08/16/2015 0033   THCU NONE DETECTED 08/16/2015 0033   LABBARB NONE DETECTED 08/16/2015 0033     IMAGING  Ct Head Wo Contrast  08/16/2015  CLINICAL DATA:  Followup intracranial hemorrhage EXAM: CT HEAD WITHOUT CONTRAST TECHNIQUE: Contiguous axial images were obtained from the base of the skull through the vertex without intravenous  contrast. COMPARISON:  Earlier same day FINDINGS: Intraparenchymal hematoma in the right basal ganglia measures 5.2 x 1.9 x 3.0 cm (volume = 15.4 cc). This is not significantly changed since the previous exam 1 volume was calculated at 15.1 cc. Surrounding edema is slightly better defined. Mild mass effect with some compression of the right lateral ventricle and right-to-left shift unchanged at 2 mm. No evidence of intraventricular or subarachnoid penetration. The remainder the brain appears normal. Calvarium is normal. No significant sinus disease. IMPRESSION: No significant change since the previous study. Right basal ganglia hematoma volume 15.4 cc. Slightly better defined surrounding edema. Electronically Signed   By: Nelson Chimes M.D.   On: 08/16/2015 12:20   Ct Head Wo Contrast  08/16/2015  CLINICAL DATA:  Follow-up intracranial hemorrhage. EXAM: CT HEAD WITHOUT CONTRAST TECHNIQUE: Contiguous axial images were obtained from the base of the skull through the vertex without intravenous contrast. COMPARISON:  Yesterday at 2238 hour FINDINGS: Right basal gangliar hemorrhage measures 4.5 x 1.9 x 3.4 cm (volume = 15.1 cc), previously 3.7 x 1.9 x 2.0 cm (volume =7.3 cc). There is  persistent mass-effect on the frontal horn of the right lateral ventricle. There is now all 2 mm right to left midline shift. There is progressive supratentorial sulcal effacement. Persistent basilar cistern effacement. No intraventricular extension. No new areas of hemorrhage are seen. IMPRESSION: Increased size of acute right basal ganglia hemorrhage, with increased mass effect. There is now 2 mm right to left midline shift. There is developing supratentorial sulcal effacement and persistent basilar cistern effacement concerning for developing cerebral edema. These results were called by telephone at the time of interpretation on 08/16/2015 at 2:10 am to Dr. Merrily Pew , who verbally acknowledged these results. Electronically Signed    By: Jeb Levering M.D.   On: 08/16/2015 02:10   Dg Chest Port 1 View  08/17/2015  CLINICAL DATA:  Respiratory failure . EXAM: PORTABLE CHEST 1 VIEW COMPARISON:  08/16/2015 . FINDINGS: Endotracheal tube, NG tube, right IJ line in stable position. Stable cardiomegaly. Interval increase in pulmonary interstitial markings consistent with congestive heart failure with interstitial edema. Small left pleural effusion cannot be excluded. No pneumothorax . IMPRESSION: 1. Lines and tubes in stable position . 2. Cardiomegaly with increased interstitial markings consistent with congestive heart failure. Small left pleural effusion cannot be excluded. Electronically Signed   By: [place]   On: 08/17/2015 07:46   Dg Chest Port 1 View  08/16/2015  CLINICAL DATA:  Central line placement. EXAM: PORTABLE CHEST 1 VIEW COMPARISON:  Earlier this day at 0139 hour FINDINGS: Endotracheal tube is 3.1 cm from the carina. Enteric tube in place. Tip of the right central line in the mid SVC. No pneumothorax. Low lung volumes. Enlargement of the cardiac silhouette is unchanged. Diminished vascular congestion and pulmonary edema from prior exam. Persistent perihilar opacities. IMPRESSION: 1. Tip of the right central line in the mid SVC.  No pneumothorax. 2. Endotracheal and enteric tubes remain in place. 3. Unchanged enlargement of the cardiac silhouette. Decreased vascular congestion and edema from prior. Electronically Signed   By: Jeb Levering M.D.   On: 08/16/2015 06:54   Dg Chest Portable 1 View  08/16/2015  CLINICAL DATA:  Endotracheal tube placement.  Initial encounter. EXAM: PORTABLE CHEST 1 VIEW COMPARISON:  None. FINDINGS: The patient's endotracheal tube is seen ending 2-3 cm above the carina. The lungs are mildly hypoexpanded. Vascular crowding and vascular congestion are seen. Increased interstitial markings raise concern for mild pulmonary edema, though pneumonia could have a similar appearance. There is no  evidence of pleural effusion or pneumothorax. The cardiomediastinal silhouette is borderline normal in size. No acute osseous abnormalities are seen. IMPRESSION: 1. Endotracheal tube seen ending 2-3 cm above the carina. 2. Lungs mildly hypoexpanded. Vascular congestion noted. Increased interstitial markings raise concern for mild pulmonary edema, though pneumonia could have a similar appearance. Electronically Signed   By: Garald Balding M.D.   On: 08/16/2015 02:07   Dg Abd Portable 1v  08/16/2015  CLINICAL DATA:  Orogastric tube placement. EXAM: PORTABLE ABDOMEN - 1 VIEW COMPARISON:  None. FINDINGS: There is an enteric tube with 1 small chole over the stomach and tip right of midline likely over the distal stomach or proximal duodenum. Bowel gas pattern is nonobstructive. Remainder of the exam is unremarkable. IMPRESSION: No acute findings per Enteric tube with tip in the right upper quadrant likely over the distal stomach or proximal duodenum. Electronically Signed   By: Marin Olp M.D.   On: 08/16/2015 07:20   Ct Head Code Stroke W/o Cm  08/15/2015  CLINICAL  DATA:  Left-sided weakness.  Code stroke. EXAM: CT HEAD WITHOUT CONTRAST TECHNIQUE: Contiguous axial images were obtained from the base of the skull through the vertex without intravenous contrast. COMPARISON:  None. FINDINGS: Acute right basal ganglia hemorrhage measures 3.7 x 1.9 x 2.0 cm (volume = 7.3 cc). There is mild edema and mass effect on the frontal horn of the right lateral ventricle but no significant midline shift. No intraventricular extension. No additional acute hemorrhage. There is crowding of the basilar cisterns. No acute ischemia elsewhere. The calvarium is intact. Included paranasal sinuses and mastoid air cells are well aerated. IMPRESSION: Acute right basal ganglia hemorrhage measuring 3.7 x 1.9 x 2.0 cm. Mild surrounding edema and mass effect, no midline shift. There is crowding of the basilar cisterns. Critical Value/emergent  results were called by telephone at the time of interpretation on 08/15/2015 at 10:56 pm to Dr. Dayna Barker, who verbally acknowledged these results. Electronically Signed   By: Jeb Levering M.D.   On: 08/15/2015 22:56   2D Echocardiogram  Left ventricle: The cavity size was normal. There was severe concentric hypertrophy. Systolic function was normal. The estimated ejection fraction was in the range of 50% to 55%. Wall motion was normal; there were no regional wall motion abnormalities. Features are consistent with a pseudonormal left ventricular filling pattern, with concomitant abnormal relaxation and increased filling pressure (grade 2 diastolic dysfunction). Doppler parameters are consistent with high ventricular filling pressure.   PHYSICAL EXAM GENERAL- Intubated, sedated, appears as stated age, not in any distress, Obese. HEENT- Atraumatic, normocephalic, Pupils constricted, reactive and equal, disconjugate, EOMI CARDIAC- RRR, no murmurs, rubs or gallops. RESP- Moving equal volumes of air, and clear to auscultation bilaterally, anteriorly ABDOMEN- Soft, obese EXTREMITIES- pulse 2+, symmetric, no pedal edema. SKIN- Warm, dry, No rash or lesion. NEUROLOGICAL EXAM : intubated and sedated- but able to follow commands on the right side. eyes closed, Pupils 2 mm reactive. Fundi nor visualized.Dolls eye present, dysconjugate gaze with mild exotropia left eye.Corneal reflex intact- but more brisk on the right, no obvious facial assym, moving RUE, RLE, LLE to pain, withdraws to pain, some purposeful movement. Minimal LUE movement to pain   ASSESSMENT/PLAN Brad Mcgee is a 30 y.o. male with history of hypertension presenting with L sided weakness. CT showed a L basal ganglia hemorrhage, with change in mental status, pt had repeat Ct head which showed increase size of hrr, and increased mass effect, pt and was subsequently intubated.  Stroke:  Non-dominant right basal ganglia hemorrhage  secondary to hypertension with resultant neuro worsening, hemorrhage extension and cerebral edema  Resultant  L hemiparesis, VDRF, HA, dysphagia  Initial CT R BG hemorrhage 3.7x1.9x2, mild edema & mass effect  Repeat CT with neuro worsening increased size of R BG hmg w/ increased mass effect. 37m R shift, supratentorial sulcal effacement, concern for cerebral edema.   Repeat CT same this afternoon  Stable hematoma. Unchanged shift or edema. Vol 15.4cc  CTA head & neck ordered for tomorrow  2D Echo  EF 50-55%. No source of embolus   SCDs for VTE prophylaxis  Diet NPO time specified, consider starting tube feeds.  No antithrombotic prior to admission  Ongoing aggressive stroke risk factor management  Therapy recommendations:  pending   Disposition:  pending   Acute respiratory failure  Secondary to hemorrhage  Intubated in the ED  CCM managing vent, On propofol and fentanyl  Cardiomegaly and abnormal EKG  Seen on chest xray, most likely related to HTN  heart dx  Echo severe concentric hypertrophy. EF 50-55%. Grade II diastolic HF  Flat elevated troponin, t wave inversions in inferior lat leads.  Might need cardiac eval at some point.   Induced Hypernatremia  To prevent cerebral edema  CL placed and started on 3% NA drip   Na 136 >147 this am .  Na goal - 150- 155.  At risk for pulmonary edema  Dysphagia  secondary to stroke  Place NG, tube feeds per PCCM.  Hypertensive Emergency  BP 182/124 on arrival in setting of acute neurologic symptoms  BP < 160 since 0400  On cardene  SBP goal < 160   Other Stroke Risk Factors  Morbid Obesity, Body mass index is 46.66 kg/(m^2).   Other [facility] day # 1   I have personally examined this patient, reviewed notes, independently viewed imaging studies, participated in medical decision making and plan of care. I have made any additions or clarifications directly to the above note.  Continue strict blood pressure control and sedation. Hypertonic saline with sodium goal 150-155. Check repeat CT scan and CT angiogram of the brain in the morning. I had a long discussion at the bedside with patient's mother and brother regarding his condition, plan of care and answered questions. This patient is critically ill and at significant risk of neurological worsening, death and care requires constant monitoring of vital signs, hemodynamics,respiratory and cardiac monitoring, extensive review of multiple databases, frequent neurological assessment, discussion with family, other specialists and medical decision making of high complexity.I have made any additions or clarifications directly to the above note.This critical care time does not reflect procedure time, or teaching time or supervisory time of PA/NP/Med Resident etc but could involve care discussion time.  I spent 32 minutes of neurocritical care time  in the care of  this patient.      Brad Contras, MD Medical Director [facility] Pager: [phone] 08/17/2015 1:39 PM

## 2015-08-17 NOTE — Progress Notes (Signed)
SLP Cancellation Note  Patient Details Name: [name] M Tribby MRN: [id number] DOB: 05/26/1985   Cancelled treatment:        Pt continues to be intubated. Will follow.   Royce MacadamiaLitaker, Akshitha Culmer Willis 08/17/2015, 1:20 PM   Breck CoonsLisa Willis Lonell FaceLitaker M.Ed [employer] Pager [phone]

## 2015-08-17 NOTE — Progress Notes (Signed)
PT Cancellation Note  Patient Details Name: [name] M Kayes MRN: [id number] DOB: 11/25/1985   Cancelled Treatment:    Reason Eval/Treat Not Completed: Patient not medically ready.  PT to continue to follow for medical stability.  We will check back tomorrow.  Thanks,    Rollene Rotundaebecca B. Robbyn Hodkinson, PT, DPT [phone]   08/17/2015, 10:48 AM

## 2015-08-17 NOTE — Progress Notes (Signed)
OT Cancellation Note  Patient Details Name: Brad MaxwellShawon M Delfavero MRN: [id number] DOB: 01/25/1986   Cancelled Treatment:    Reason Eval/Treat Not Completed: Patient not medically ready  [facility] IncWARD,HILLARY  Keylin Ferryman, OTR/L  [phone] 08/17/2015 08/17/2015, 9:19 AM

## 2015-08-17 NOTE — Care Management Note (Signed)
Case Management Note  Patient Details  Name: [name] M Standre MRN: [id number] Date of Birth: 01/29/1986  Subjective/Objective: Pt admitted on 08/15/15 with [facility].  PTA, pt independent of ADLS.                     Action/Plan: Pt remains currrently sedated and on ventilator.  Will continue to follow for discharge planning as pt progresses.    Expected Discharge Date:                  Expected Discharge Plan:  IP Rehab Facility  In-House Referral:     Discharge planning Services  CM Consult  Post Acute Care Choice:    Choice offered to:     DME Arranged:    DME Agency:     HH Arranged:    HH Agency:     Status of Service:  In process, will continue to follow  Medicare Important Message Given:    Date Medicare IM Given:    Medicare IM give by:    Date Additional Medicare IM Given:    Additional Medicare Important Message give by:     If discussed at Long Length of Stay Meetings, dates discussed:    Additional Comments:  Quintella BatonJulie W. Nikoloz Huy, RN, BSN  Trauma/Neuro ICU Case Manager [phone]

## 2015-08-18 ENCOUNTER — Inpatient Hospital Stay (HOSPITAL_COMMUNITY): Payer: 59

## 2015-08-18 DIAGNOSIS — J9601 Acute respiratory failure with hypoxia: Secondary | ICD-10-CM

## 2015-08-18 DIAGNOSIS — G936 Cerebral edema: Secondary | ICD-10-CM | POA: Diagnosis not present

## 2015-08-18 DIAGNOSIS — R509 Fever, unspecified: Secondary | ICD-10-CM

## 2015-08-18 DIAGNOSIS — G935 Compression of brain: Secondary | ICD-10-CM

## 2015-08-18 DIAGNOSIS — I5033 Acute on chronic diastolic (congestive) heart failure: Secondary | ICD-10-CM

## 2015-08-18 LAB — GLUCOSE, CAPILLARY
Glucose-Capillary: 99 mg/dL (ref 65–99)
Glucose-Capillary: 109 mg/dL — ABNORMAL HIGH (ref 65–99)
Glucose-Capillary: 115 mg/dL — ABNORMAL HIGH (ref 65–99)
Glucose-Capillary: 94 mg/dL (ref 65–99)
Glucose-Capillary: 108 mg/dL — ABNORMAL HIGH (ref 65–99)

## 2015-08-18 LAB — RENAL FUNCTION PANEL
Albumin: 3 g/dL — ABNORMAL LOW (ref 3.5–5.0)
Anion gap: 5 (ref 5–15)
BUN: 14 mg/dL (ref 6–20)
CO2: 24 mmol/L (ref 22–32)
Calcium: 8.6 mg/dL — ABNORMAL LOW (ref 8.9–10.3)
Chloride: 125 mmol/L — ABNORMAL HIGH (ref 101–111)
Creatinine, Ser: 1.02 mg/dL (ref 0.61–1.24)
GFR calc Af Amer: >60 mL/min (ref >60)
GFR calc non Af Amer: >60 mL/min (ref >60)
Glucose, Bld: 108 mg/dL — ABNORMAL HIGH (ref 65–99)
Phosphorus: 2.2 mg/dL — ABNORMAL LOW (ref 2.5–4.6)
Potassium: 3.8 mmol/L (ref 3.5–5.1)
Sodium: 154 mmol/L — ABNORMAL HIGH (ref 135–145)

## 2015-08-18 LAB — CBC
HCT: 39 % (ref 39.0–52.0)
Hemoglobin: 11.9 g/dL — ABNORMAL LOW (ref 13.0–17.0)
MCH: 30.8 pg (ref 26.0–34.0)
MCHC: 30.5 g/dL (ref 30.0–36.0)
MCV: 101 fL — ABNORMAL HIGH (ref 78.0–100.0)
Platelets: 207 10*3/uL (ref 150–400)
RBC: 3.86 MIL/uL — ABNORMAL LOW (ref 4.22–5.81)
RDW: 14.3 % (ref 11.5–15.5)
WBC: 11.3 10*3/uL — ABNORMAL HIGH (ref 4.0–10.5)

## 2015-08-18 LAB — URINALYSIS, ROUTINE W REFLEX MICROSCOPIC
Bilirubin Urine: NEGATIVE
Glucose, UA: NEGATIVE mg/dL
Hgb urine dipstick: NEGATIVE
Ketones, ur: NEGATIVE mg/dL
Leukocytes, UA: NEGATIVE
Nitrite: NEGATIVE
Protein, ur: NEGATIVE mg/dL
Specific Gravity, Urine: 1.017 (ref 1.005–1.030)
pH: 7 (ref 5.0–8.0)

## 2015-08-18 LAB — SODIUM
Sodium: 152 mmol/L — ABNORMAL HIGH (ref 135–145)
Sodium: 153 mmol/L — ABNORMAL HIGH (ref 135–145)
Sodium: 154 mmol/L — ABNORMAL HIGH (ref 135–145)
Sodium: 155 mmol/L — ABNORMAL HIGH (ref 135–145)

## 2015-08-18 LAB — TRIGLYCERIDES: Triglycerides: 247 mg/dL — ABNORMAL HIGH (ref <150)

## 2015-08-18 LAB — MAGNESIUM: Magnesium: 2 mg/dL (ref 1.7–2.4)

## 2015-08-18 LAB — PROCALCITONIN: Procalcitonin: 0.1 ng/mL

## 2015-08-18 MED ORDER — LISINOPRIL 10 MG PO TABS
10.0000 mg | ORAL_TABLET | Freq: Every day | ORAL | Status: DC
Start: 2015-08-18 — End: 2015-08-21
  Administered 2015-08-18 – 2015-08-20 (×3): 10 mg via ORAL
  Filled 2015-08-18 (×3): qty 1

## 2015-08-18 MED ORDER — IOPAMIDOL (ISOVUE-370) INJECTION 76%
INTRAVENOUS | Status: AC
  Administered 2015-08-18: 100 mL
  Filled 2015-08-18: qty 100

## 2015-08-18 MED ORDER — TRIAMTERENE-HCTZ 37.5-25 MG PO TABS
1.0000 | ORAL_TABLET | Freq: Every day | ORAL | Status: DC
  Administered 2015-08-18 – 2015-09-01 (×13): 1 via ORAL
  Filled 2015-08-18 (×16): qty 1

## 2015-08-18 NOTE — Progress Notes (Signed)
PT Cancellation Note  Patient Details Name: Brad Mcgee MRN: [id number] DOB: 02/27/1986   Cancelled Treatment:    Reason Eval/Treat Not Completed: Patient not medically ready. Intubated and sedated.  PT to check back Monday, 08/21/15.    Thanks,    Rollene Rotundaebecca B. Nadezhda Pollitt, PT, DPT [phone]   08/18/2015, 9:51 AM

## 2015-08-18 NOTE — Progress Notes (Signed)
STROKE TEAM PROGRESS NOTE   SUBJECTIVE (INTERVAL HISTORY) Patient remains sedated and intubated. Neurologically stable. Blood pressure has been adequately controlled. He remains restless when sedation is off. He is on hypertonic saline and serum sodium this morning is 154 .He has mild congestive heart failure and CCM is managing.CT angio shows no stenosis/avm or aneurysm.   OBJECTIVE Temp:  [99.5 F (37.5 C)-102.6 F (39.2 C)] 101.2 F (38.4 C) (06/02 1146) Pulse Rate:  [91-129] 112 (06/02 1230) Cardiac Rhythm:  [-] Sinus tachycardia (06/02 0800) Resp:  [0-47] 21 (06/02 1230) BP: (134-177)/(75-101) 154/85 mmHg (06/02 1230) SpO2:  [95 %-100 %] 99 % (06/02 1230) FiO2 (%):  [40 %] 40 % (06/02 1130) Weight:  [303 lb 9.2 oz (137.7 kg)] 303 lb 9.2 oz (137.7 kg) (06/02 0406)  CBC:   Recent Labs Lab 08/15/15 2245  08/17/15 0500 08/18/15 0550  WBC 6.7  < > 7.1 11.3*  NEUTROABS 3.4  --   --   --   HGB 14.0  < > 12.2* 11.9*  HCT 40.8  < > 38.6* 39.0  MCV 92.9  < > 97.5 101.0*  PLT 217  < > 196 207  < > = values in this interval not displayed.  Basic Metabolic Panel:  Recent Labs Lab 08/16/15 0417  08/18/15 0550 08/18/15 1155  NA 136  < > 154*  154* 153*  K 4.4  --  3.8  --   CL 105  --  125*  --   CO2 25  --  24  --   GLUCOSE 97  --  108*  --   BUN 13  --  14  --   CREATININE 1.04  --  1.02  --   CALCIUM 8.8*  --  8.6*  --   MG  --   --  2.0  --   PHOS  --   --  2.2*  --   < > = values in this interval not displayed.  Lipid Panel:     Component Value Date/Time   TRIG 247* 08/18/2015 0550   HgbA1c: No results found for: HGBA1C Urine Drug Screen:     Component Value Date/Time   LABOPIA NONE DETECTED 08/16/2015 0033   COCAINSCRNUR NONE DETECTED 08/16/2015 0033   LABBENZ NONE DETECTED 08/16/2015 0033   AMPHETMU NONE DETECTED 08/16/2015 0033   THCU NONE DETECTED 08/16/2015 0033   LABBARB NONE DETECTED 08/16/2015 0033     IMAGING  Ct Angio Head W/cm &/or Wo  Cm  08/18/2015  CLINICAL DATA:  Emanation for intra cerebral hemorrhage. EXAM: CT ANGIOGRAPHY HEAD AND NECK TECHNIQUE: Multidetector CT imaging of the head and neck was performed using the standard protocol during bolus administration of intravenous contrast. Multiplanar CT image reconstructions and MIPs were obtained to evaluate the vascular anatomy. Carotid stenosis measurements (when applicable) are obtained utilizing NASCET criteria, using the distal internal carotid diameter as the denominator. CONTRAST:  100 cc of Isovue 370. COMPARISON:  Prior study from 08/16/2015. FINDINGS: CT HEAD Intraparenchymal hematoma centered at the right basal ganglia measures 54 x 20 x 30 mm (estimated volume 16 cc, slightly increased in size relative to previous). Slightly increased surrounding vasogenic edema which is also slightly more well-defined. A effacement of the right lateral ventricle, similar. Similar 2 mm of right-to-left shift. Size of the left lateral ventricle is stable without evidence for ventricular trapping. No evidence for intraventricular extension. No new subarachnoid or other hemorrhage. Diffuse cerebral sulcal effacement is similar to prior,  worrisome for cerebral edema. Remainder of the brain demonstrates no acute abnormality in is relatively normal in appearance. Scalp soft tissues within normal limits. No acute abnormality about the orbits. Mild exophthalmos is stable. Scattered mucosal thickening within the paranasal sinuses with fluid levels within the sphenoid sinuses. Patient is intubated. No mastoid effusion. Calvarium unchanged. CTA NECK Aortic arch: Examination somewhat limited by timing the contrast bolus and body habitus. Visualized aortic arch is normal in caliber with normal branch pattern. No high-grade stenosis at the origin of the great vessels. Insula note made of a bovine arch with common origin of the left common and right brachiocephalic arteries. Visualized subclavian arteries patent  bilaterally. Right carotid system: Right common carotid artery origin not well evaluated due to streak artifact from venous contrast and body habitus, but is grossly patent without stenosis. Right common carotid artery patent distally to the bifurcation. No significant atheromatous plaque about the right bifurcation. Right ICA patent from the bifurcation to the skullbase without stenosis, dissection, or occlusion. Right external carotid artery and its branches within normal limits. Left carotid system: Left common carotid artery patent from its origin to the bifurcation. No significant atheromatous plaque about the left bifurcation. Left ICA patent from the bifurcation to the skullbase without stenosis, dissection, or occlusion. Left external carotid artery and its branches within normal limits. Vertebral arteries:Both vertebral arteries arise from the subclavian arteries. Origin of the right vertebral artery not well visualized due to streak artifact from venous contrast and body habitus. Vertebral arteries patent within the neck without occlusion. No definite stenosis or evidence for dissection, although evaluation somewhat limited due to body habitus. Skeleton: No acute osseous abnormality. No worrisome lytic or blastic osseous lesions. Other neck: Mild atelectatic changes noted within the visualized lungs. Visualized lungs are otherwise grossly clear. Visualize mediastinum demonstrates no acute abnormality. Endotracheal tube terminates just above the carina. Thyroid gland normal. No significant adenopathy. No acute soft tissue abnormality. Right-sided central line in place within the right IJ. CTA HEAD Anterior circulation: Petrous, cavernous, and supraclinoid segments patent without definite flow limiting stenosis. Evaluation of the cavernous right ICA is somewhat limited due to asymmetric venous filling of the adjacent right cavernous sinus. A1 segments patent. Anterior communicating artery normal. Anterior  cerebral arteries well opacified to their distal aspects. M1 segments patent without stenosis or occlusion. MCA bifurcations normal. Distal MCA branches well opacified and symmetric. Mild mass on the right MCA branches due to the right basal ganglia hemorrhage. Posterior circulation: Vertebral arteries patent to the vertebrobasilar junction. Posterior inferior cerebral arteries not well evaluated. Basilar artery patent to its distal aspect. Superior cerebellar arteries patent bilaterally. Both of the posterior cerebral arteries arise knee basilar artery and are well opacified to their distal aspects. Venous sinuses: Grossly patent without evidence for venous sinus thrombosis. Anatomic variants: No anatomic variant.  No definite aneurysm. Delayed phase: Mild leptomeningeal enhancement about the right basal ganglia hemorrhage due to vascular congestion. No other pathologic enhancement. IMPRESSION: 1. Negative CTA of the head and neck. Please note that evaluation is somewhat limited by body habitus and timing of the contrast bolus. 2. Slight interval increase in size of acute right basal ganglia parenchymal hemorrhage, now measuring approximately 16 cc in estimated volume, previously 15.4 cc. Slightly increased and more defined surrounding vasogenic edema. Similar 2 mm right-to-left shift. Supratentorial sulcal effacement concerning for cerebral edema, similar to prior. Electronically Signed   By: Jeannine Boga M.D.   On: 08/18/2015 06:59   Ct Angio Neck  W/cm &/or Wo/cm  08/18/2015  CLINICAL DATA:  Emanation for intra cerebral hemorrhage. EXAM: CT ANGIOGRAPHY HEAD AND NECK TECHNIQUE: Multidetector CT imaging of the head and neck was performed using the standard protocol during bolus administration of intravenous contrast. Multiplanar CT image reconstructions and MIPs were obtained to evaluate the vascular anatomy. Carotid stenosis measurements (when applicable) are obtained utilizing NASCET criteria, using the  distal internal carotid diameter as the denominator. CONTRAST:  100 cc of Isovue 370. COMPARISON:  Prior study from 08/16/2015. FINDINGS: CT HEAD Intraparenchymal hematoma centered at the right basal ganglia measures 54 x 20 x 30 mm (estimated volume 16 cc, slightly increased in size relative to previous). Slightly increased surrounding vasogenic edema which is also slightly more well-defined. A effacement of the right lateral ventricle, similar. Similar 2 mm of right-to-left shift. Size of the left lateral ventricle is stable without evidence for ventricular trapping. No evidence for intraventricular extension. No new subarachnoid or other hemorrhage. Diffuse cerebral sulcal effacement is similar to prior, worrisome for cerebral edema. Remainder of the brain demonstrates no acute abnormality in is relatively normal in appearance. Scalp soft tissues within normal limits. No acute abnormality about the orbits. Mild exophthalmos is stable. Scattered mucosal thickening within the paranasal sinuses with fluid levels within the sphenoid sinuses. Patient is intubated. No mastoid effusion. Calvarium unchanged. CTA NECK Aortic arch: Examination somewhat limited by timing the contrast bolus and body habitus. Visualized aortic arch is normal in caliber with normal branch pattern. No high-grade stenosis at the origin of the great vessels. Insula note made of a bovine arch with common origin of the left common and right brachiocephalic arteries. Visualized subclavian arteries patent bilaterally. Right carotid system: Right common carotid artery origin not well evaluated due to streak artifact from venous contrast and body habitus, but is grossly patent without stenosis. Right common carotid artery patent distally to the bifurcation. No significant atheromatous plaque about the right bifurcation. Right ICA patent from the bifurcation to the skullbase without stenosis, dissection, or occlusion. Right external carotid artery and  its branches within normal limits. Left carotid system: Left common carotid artery patent from its origin to the bifurcation. No significant atheromatous plaque about the left bifurcation. Left ICA patent from the bifurcation to the skullbase without stenosis, dissection, or occlusion. Left external carotid artery and its branches within normal limits. Vertebral arteries:Both vertebral arteries arise from the subclavian arteries. Origin of the right vertebral artery not well visualized due to streak artifact from venous contrast and body habitus. Vertebral arteries patent within the neck without occlusion. No definite stenosis or evidence for dissection, although evaluation somewhat limited due to body habitus. Skeleton: No acute osseous abnormality. No worrisome lytic or blastic osseous lesions. Other neck: Mild atelectatic changes noted within the visualized lungs. Visualized lungs are otherwise grossly clear. Visualize mediastinum demonstrates no acute abnormality. Endotracheal tube terminates just above the carina. Thyroid gland normal. No significant adenopathy. No acute soft tissue abnormality. Right-sided central line in place within the right IJ. CTA HEAD Anterior circulation: Petrous, cavernous, and supraclinoid segments patent without definite flow limiting stenosis. Evaluation of the cavernous right ICA is somewhat limited due to asymmetric venous filling of the adjacent right cavernous sinus. A1 segments patent. Anterior communicating artery normal. Anterior cerebral arteries well opacified to their distal aspects. M1 segments patent without stenosis or occlusion. MCA bifurcations normal. Distal MCA branches well opacified and symmetric. Mild mass on the right MCA branches due to the right basal ganglia hemorrhage. Posterior circulation: Vertebral  arteries patent to the vertebrobasilar junction. Posterior inferior cerebral arteries not well evaluated. Basilar artery patent to its distal aspect. Superior  cerebellar arteries patent bilaterally. Both of the posterior cerebral arteries arise knee basilar artery and are well opacified to their distal aspects. Venous sinuses: Grossly patent without evidence for venous sinus thrombosis. Anatomic variants: No anatomic variant.  No definite aneurysm. Delayed phase: Mild leptomeningeal enhancement about the right basal ganglia hemorrhage due to vascular congestion. No other pathologic enhancement. IMPRESSION: 1. Negative CTA of the head and neck. Please note that evaluation is somewhat limited by body habitus and timing of the contrast bolus. 2. Slight interval increase in size of acute right basal ganglia parenchymal hemorrhage, now measuring approximately 16 cc in estimated volume, previously 15.4 cc. Slightly increased and more defined surrounding vasogenic edema. Similar 2 mm right-to-left shift. Supratentorial sulcal effacement concerning for cerebral edema, similar to prior. Electronically Signed   By: Jeannine Boga M.D.   On: 08/18/2015 06:59   Dg Chest Port 1 View  08/17/2015  CLINICAL DATA:  Respiratory failure . EXAM: PORTABLE CHEST 1 VIEW COMPARISON:  08/16/2015 . FINDINGS: Endotracheal tube, NG tube, right IJ line in stable position. Stable cardiomegaly. Interval increase in pulmonary interstitial markings consistent with congestive heart failure with interstitial edema. Small left pleural effusion cannot be excluded. No pneumothorax . IMPRESSION: 1. Lines and tubes in stable position . 2. Cardiomegaly with increased interstitial markings consistent with congestive heart failure. Small left pleural effusion cannot be excluded. Electronically Signed   By: Marcello Moores  Register   On: 08/17/2015 07:46   2D Echocardiogram  Left ventricle: The cavity size was normal. There was severe concentric hypertrophy. Systolic function was normal. The estimated ejection fraction was in the range of 50% to 55%. Wall motion was normal; there were no regional wall motion  abnormalities. Features are consistent with a pseudonormal left ventricular filling pattern, with concomitant abnormal relaxation and increased filling pressure (grade 2 diastolic dysfunction). Doppler parameters are consistent with high ventricular filling pressure.   PHYSICAL EXAM GENERAL- Intubated, sedated, appears as stated age, not in any distress, Obese. HEENT- Atraumatic, normocephalic, Pupils constricted, reactive and equal, disconjugate, EOMI CARDIAC- RRR, no murmurs, rubs or gallops. RESP- Moving equal volumes of air, and clear to auscultation bilaterally, anteriorly ABDOMEN- Soft, obese EXTREMITIES- pulse 2+, symmetric, no pedal edema. SKIN- Warm, dry, No rash or lesion. NEUROLOGICAL EXAM : intubated and sedated- but able to follow commands on the right side. eyes closed, Pupils 2 mm reactive. Fundi nor visualized.Dolls eye present, slightly dysconjugate gaze with mild exotropia left eye.Corneal reflex intact- but more brisk on the right, no obvious facial assym, moving RUE, RLE, LLE to pain, withdraws to pain, some purposeful movement. Minimal LUE movement to pain   ASSESSMENT/PLAN Mr. TOMY KHIM is a 30 y.o. male with history of hypertension presenting with L sided weakness. CT showed a L basal ganglia hemorrhage, with change in mental status, pt had repeat Ct head which showed increase size of hrr, and increased mass effect, pt and was subsequently intubated.  Stroke:  Non-dominant right basal ganglia hemorrhage secondary to hypertension with resultant neuro worsening, hemorrhage extension and cerebral edema  Resultant  L hemiparesis, VDRF, HA, dysphagia  Initial CT R BG hemorrhage 3.7x1.9x2, mild edema & mass effect  Repeat CT with neuro worsening increased size of R BG hmg w/ increased mass effect. 33m R shift, supratentorial sulcal effacement, concern for cerebral edema.   Repeat CT same this afternoon  Stable hematoma.  Unchanged shift or edema. Vol 15.4cc  CTA head &  neck ordered for tomorrow  2D Echo  EF 50-55%. No source of embolus   SCDs for VTE prophylaxis  Diet NPO time specified, consider starting tube feeds.  No antithrombotic prior to admission  Ongoing aggressive stroke risk factor management  Therapy recommendations:  pending   Disposition:  pending   Acute respiratory failure  Secondary to hemorrhage  Intubated in the ED  CCM managing vent, On propofol and fentanyl  Cardiomegaly and abnormal EKG  Seen on chest xray, most likely related to HTN heart dx  Echo severe concentric hypertrophy. EF 50-55%. Grade II diastolic HF  Flat elevated troponin, t wave inversions in inferior lat leads.  Might need cardiac eval at some point.   Induced Hypernatremia  To prevent cerebral edema  CL placed and started on 3% NA drip   Na 136 >154 this am .  Na goal - 150- 155.  At risk for pulmonary edema  Dysphagia  secondary to stroke  Place NG, tube feeds per PCCM.  Hypertensive Emergency  BP 182/124 on arrival in setting of acute neurologic symptoms  BP < 160 since 0400  On cardene  SBP goal < 160   Other Stroke Risk Factors  Morbid Obesity, Body mass index is 47.54 kg/(m^2).   Other [facility] day # 2   I have personally examined this patient, reviewed notes, independently viewed imaging studies, participated in medical decision making and plan of care. I have made any additions or clarifications directly to the above note. Continue strict blood pressure control and sedation. Hypertonic saline with sodium goal 150-155. Plan decrease hypertonic saline drip to 50cc/hr. Aggressive temp control and maintain normothermia.  I had a long discussion at the bedside with patient's mother and dad regarding his condition, plan of care and answered questions. Dr Ashok Cordia to decide on extubation likely after 2-3 days.d/w Dr Ashok Cordia This patient is critically ill and at significant risk of neurological worsening,  death and care requires constant monitoring of vital signs, hemodynamics,respiratory and cardiac monitoring, extensive review of multiple databases, frequent neurological assessment, discussion with family, other specialists and medical decision making of high complexity.I have made any additions or clarifications directly to the above note.This critical care time does not reflect procedure time, or teaching time or supervisory time of PA/NP/Med Resident etc but could involve care discussion time.  I spent 34 minutes of neurocritical care time  in the care of  this patient.      Antony Contras, MD Medical Director [facility] Pager: [phone] 08/18/2015 1:10 PM

## 2015-08-18 NOTE — Progress Notes (Signed)
SLP Cancellation Note  Patient Details Name: Brad Mcgee MRN: [id number] DOB: 01/15/1986   Cancelled treatment:        Pt continues to be intubated. Will follow.   Royce MacadamiaLitaker, Brina Umeda Willis 08/18/2015, 1:04 PM  Breck CoonsLisa Willis Lonell FaceLitaker M.Ed [employer] Pager [phone]

## 2015-08-18 NOTE — Progress Notes (Signed)
OT Cancellation Note  Patient Details Name: [name] M Ralston MRN: [id number] DOB: 06/25/1985   Cancelled Treatment:    Reason Eval/Treat Not Completed: Patient not medically ready Pt remains intubated and sedated. Will follow up on Monday. [facility] CenterWARD,HILLARY  Benito Lemmerman, OTR/L  [phone] 08/18/2015 08/18/2015, 9:14 AM

## 2015-08-18 NOTE — Progress Notes (Signed)
PULMONARY / CRITICAL CARE MEDICINE   Name: Brad Mcgee MRN: [id number] DOB: 1986-01-29    ADMISSION DATE:  08/15/2015 CONSULTATION DATE:  08/16/2015  REFERRING MD:  Dr. Amada Jupiter  CHIEF COMPLAINT:  Left side weakness  HISTORY OF PRESENT ILLNESS:  Hx from chart. 30 yo male with slurred speech and Lt sided weakness.  This was associated with headache.  He was intubated for airway protection.  He had CT head which showed 3.7 x 1.9 x 2 cm Rt basal ganglia ICH which then increased to 4.5 x 1.9 x 3.4 cm ICH on f/u CT head.  His BP was 187/106.  SUBJECTIVE: No acute events overnight. Per report patient following commands off sedation.  REVIEW OF SYSTEMS:  Unable to obtain given intubation.  VITAL SIGNS: BP 154/85 mmHg  Pulse 112  Temp(Src) 101.2 F (38.4 C) (Axillary)  Resp 21  Ht  (1.702 m)  Wt 303 lb 9.2 oz (137.7 kg)  BMI 47.54 kg/m2  SpO2 99%  HEMODYNAMICS:    VENTILATOR SETTINGS: Vent Mode:  [-] PRVC FiO2 (%):  [40 %] 40 % Set Rate:  [16 bmp] 16 bmp Vt Set:  [600 mL] 600 mL PEEP:  [5 cmH20] 5 cmH20 Plateau Pressure:  [18 cmH20-26 cmH20] 22 cmH20  INTAKE / OUTPUT: I/O last 3 completed shifts: In: 6088.2 [I.V.:5908.2; NG/GT:180] Out: 3950 [Urine:2900; Emesis/NG output:1050]  PHYSICAL EXAMINATION: General:  Obese. No distress. No family at bedside. Neuro:  Pupils pinpoint and symmetric. No spontaneous movements. Opens eyes with stimulation. HEENT:  ETT in place. No scleral icterus or injection. Cardiovascular:  Regular rate & rhythm. No appreciable JVD given body habitus. Lungs:  Distant breath sounds. Symmetric chest rise on vent. Copious pink-tinged secretions in ETT suction catheter. Abdomen:  Soft. Protuberant. Normal bowel sounds. Integument:  Warm & dry. No rash on exposed skin.  LABS:  BMET  Recent Labs Lab 08/15/15 2245 08/16/15 0417  08/17/15 2355 08/18/15 0550 08/18/15 1155  NA 137 136  < > 152* 154*  154* 153*  K 3.9 4.4  --   --  3.8   --   CL 105 105  --   --  125*  --   CO2 26 25  --   --  24  --   BUN 20 13  --   --  14  --   CREATININE 1.20 1.04  --   --  1.02  --   GLUCOSE 115* 97  --   --  108*  --   < > = values in this interval not displayed.  Electrolytes  Recent Labs Lab 08/15/15 2245 08/16/15 0417 08/18/15 0550  CALCIUM 8.8* 8.8* 8.6*  MG  --   --  2.0  PHOS  --   --  2.2*    CBC  Recent Labs Lab 08/16/15 0417 08/17/15 0500 08/18/15 0550  WBC 8.0 7.1 11.3*  HGB 14.2 12.2* 11.9*  HCT 42.2 38.6* 39.0  PLT 200 196 207    Coag's  Recent Labs Lab 08/15/15 2245  APTT 32  INR 0.98    Sepsis Markers No results for input(s): LATICACIDVEN, PROCALCITON, O2SATVEN in the last 168 hours.  ABG  Recent Labs Lab 08/16/15 0434  PHART 7.333*  PCO2ART 45.2*  PO2ART 73.9*    Liver Enzymes  Recent Labs Lab 08/15/15 2245 08/18/15 0550  AST 23  --   ALT 20  --   ALKPHOS 58  --   BILITOT 0.4  --  ALBUMIN 4.3 3.0*    Cardiac Enzymes  Recent Labs Lab 08/15/15 2245 08/16/15 0435 08/16/15 1030  TROPONINI 0.05* 0.06* 0.06*    Glucose  Recent Labs Lab 08/16/15 0423 08/17/15 2009 08/17/15 2353 08/18/15 0350 08/18/15 0815 08/18/15 1140  GLUCAP 113* 95 105* 99 109* 115*    Imaging Ct Angio Head W/cm &/or Wo Cm  08/18/2015  CLINICAL DATA:  Emanation for intra cerebral hemorrhage. EXAM: CT ANGIOGRAPHY HEAD AND NECK TECHNIQUE: Multidetector CT imaging of the head and neck was performed using the standard protocol during bolus administration of intravenous contrast. Multiplanar CT image reconstructions and MIPs were obtained to evaluate the vascular anatomy. Carotid stenosis measurements (when applicable) are obtained utilizing NASCET criteria, using the distal internal carotid diameter as the denominator. CONTRAST:  100 cc of Isovue 370. COMPARISON:  Prior study from 08/16/2015. FINDINGS: CT HEAD Intraparenchymal hematoma centered at the right basal ganglia measures 54 x 20 x 30 mm  (estimated volume 16 cc, slightly increased in size relative to previous). Slightly increased surrounding vasogenic edema which is also slightly more well-defined. A effacement of the right lateral ventricle, similar. Similar 2 mm of right-to-left shift. Size of the left lateral ventricle is stable without evidence for ventricular trapping. No evidence for intraventricular extension. No new subarachnoid or other hemorrhage. Diffuse cerebral sulcal effacement is similar to prior, worrisome for cerebral edema. Remainder of the brain demonstrates no acute abnormality in is relatively normal in appearance. Scalp soft tissues within normal limits. No acute abnormality about the orbits. Mild exophthalmos is stable. Scattered mucosal thickening within the paranasal sinuses with fluid levels within the sphenoid sinuses. Patient is intubated. No mastoid effusion. Calvarium unchanged. CTA NECK Aortic arch: Examination somewhat limited by timing the contrast bolus and body habitus. Visualized aortic arch is normal in caliber with normal branch pattern. No high-grade stenosis at the origin of the great vessels. Insula note made of a bovine arch with common origin of the left common and right brachiocephalic arteries. Visualized subclavian arteries patent bilaterally. Right carotid system: Right common carotid artery origin not well evaluated due to streak artifact from venous contrast and body habitus, but is grossly patent without stenosis. Right common carotid artery patent distally to the bifurcation. No significant atheromatous plaque about the right bifurcation. Right ICA patent from the bifurcation to the skullbase without stenosis, dissection, or occlusion. Right external carotid artery and its branches within normal limits. Left carotid system: Left common carotid artery patent from its origin to the bifurcation. No significant atheromatous plaque about the left bifurcation. Left ICA patent from the bifurcation to the  skullbase without stenosis, dissection, or occlusion. Left external carotid artery and its branches within normal limits. Vertebral arteries:Both vertebral arteries arise from the subclavian arteries. Origin of the right vertebral artery not well visualized due to streak artifact from venous contrast and body habitus. Vertebral arteries patent within the neck without occlusion. No definite stenosis or evidence for dissection, although evaluation somewhat limited due to body habitus. Skeleton: No acute osseous abnormality. No worrisome lytic or blastic osseous lesions. Other neck: Mild atelectatic changes noted within the visualized lungs. Visualized lungs are otherwise grossly clear. Visualize mediastinum demonstrates no acute abnormality. Endotracheal tube terminates just above the carina. Thyroid gland normal. No significant adenopathy. No acute soft tissue abnormality. Right-sided central line in place within the right IJ. CTA HEAD Anterior circulation: Petrous, cavernous, and supraclinoid segments patent without definite flow limiting stenosis. Evaluation of the cavernous right ICA is somewhat limited due to  asymmetric venous filling of the adjacent right cavernous sinus. A1 segments patent. Anterior communicating artery normal. Anterior cerebral arteries well opacified to their distal aspects. M1 segments patent without stenosis or occlusion. MCA bifurcations normal. Distal MCA branches well opacified and symmetric. Mild mass on the right MCA branches due to the right basal ganglia hemorrhage. Posterior circulation: Vertebral arteries patent to the vertebrobasilar junction. Posterior inferior cerebral arteries not well evaluated. Basilar artery patent to its distal aspect. Superior cerebellar arteries patent bilaterally. Both of the posterior cerebral arteries arise knee basilar artery and are well opacified to their distal aspects. Venous sinuses: Grossly patent without evidence for venous sinus thrombosis.  Anatomic variants: No anatomic variant.  No definite aneurysm. Delayed phase: Mild leptomeningeal enhancement about the right basal ganglia hemorrhage due to vascular congestion. No other pathologic enhancement. IMPRESSION: 1. Negative CTA of the head and neck. Please note that evaluation is somewhat limited by body habitus and timing of the contrast bolus. 2. Slight interval increase in size of acute right basal ganglia parenchymal hemorrhage, now measuring approximately 16 cc in estimated volume, previously 15.4 cc. Slightly increased and more defined surrounding vasogenic edema. Similar 2 mm right-to-left shift. Supratentorial sulcal effacement concerning for cerebral edema, similar to prior. Electronically Signed   By: Rise MuBenjamin  McClintock M.D.   On: 08/18/2015 06:59   Ct Angio Neck W/cm &/or Wo/cm  08/18/2015  CLINICAL DATA:  Emanation for intra cerebral hemorrhage. EXAM: CT ANGIOGRAPHY HEAD AND NECK TECHNIQUE: Multidetector CT imaging of the head and neck was performed using the standard protocol during bolus administration of intravenous contrast. Multiplanar CT image reconstructions and MIPs were obtained to evaluate the vascular anatomy. Carotid stenosis measurements (when applicable) are obtained utilizing NASCET criteria, using the distal internal carotid diameter as the denominator. CONTRAST:  100 cc of Isovue 370. COMPARISON:  Prior study from 08/16/2015. FINDINGS: CT HEAD Intraparenchymal hematoma centered at the right basal ganglia measures 54 x 20 x 30 mm (estimated volume 16 cc, slightly increased in size relative to previous). Slightly increased surrounding vasogenic edema which is also slightly more well-defined. A effacement of the right lateral ventricle, similar. Similar 2 mm of right-to-left shift. Size of the left lateral ventricle is stable without evidence for ventricular trapping. No evidence for intraventricular extension. No new subarachnoid or other hemorrhage. Diffuse cerebral sulcal  effacement is similar to prior, worrisome for cerebral edema. Remainder of the brain demonstrates no acute abnormality in is relatively normal in appearance. Scalp soft tissues within normal limits. No acute abnormality about the orbits. Mild exophthalmos is stable. Scattered mucosal thickening within the paranasal sinuses with fluid levels within the sphenoid sinuses. Patient is intubated. No mastoid effusion. Calvarium unchanged. CTA NECK Aortic arch: Examination somewhat limited by timing the contrast bolus and body habitus. Visualized aortic arch is normal in caliber with normal branch pattern. No high-grade stenosis at the origin of the great vessels. Insula note made of a bovine arch with common origin of the left common and right brachiocephalic arteries. Visualized subclavian arteries patent bilaterally. Right carotid system: Right common carotid artery origin not well evaluated due to streak artifact from venous contrast and body habitus, but is grossly patent without stenosis. Right common carotid artery patent distally to the bifurcation. No significant atheromatous plaque about the right bifurcation. Right ICA patent from the bifurcation to the skullbase without stenosis, dissection, or occlusion. Right external carotid artery and its branches within normal limits. Left carotid system: Left common carotid artery patent from its origin to  the bifurcation. No significant atheromatous plaque about the left bifurcation. Left ICA patent from the bifurcation to the skullbase without stenosis, dissection, or occlusion. Left external carotid artery and its branches within normal limits. Vertebral arteries:Both vertebral arteries arise from the subclavian arteries. Origin of the right vertebral artery not well visualized due to streak artifact from venous contrast and body habitus. Vertebral arteries patent within the neck without occlusion. No definite stenosis or evidence for dissection, although evaluation  somewhat limited due to body habitus. Skeleton: No acute osseous abnormality. No worrisome lytic or blastic osseous lesions. Other neck: Mild atelectatic changes noted within the visualized lungs. Visualized lungs are otherwise grossly clear. Visualize mediastinum demonstrates no acute abnormality. Endotracheal tube terminates just above the carina. Thyroid gland normal. No significant adenopathy. No acute soft tissue abnormality. Right-sided central line in place within the right IJ. CTA HEAD Anterior circulation: Petrous, cavernous, and supraclinoid segments patent without definite flow limiting stenosis. Evaluation of the cavernous right ICA is somewhat limited due to asymmetric venous filling of the adjacent right cavernous sinus. A1 segments patent. Anterior communicating artery normal. Anterior cerebral arteries well opacified to their distal aspects. M1 segments patent without stenosis or occlusion. MCA bifurcations normal. Distal MCA branches well opacified and symmetric. Mild mass on the right MCA branches due to the right basal ganglia hemorrhage. Posterior circulation: Vertebral arteries patent to the vertebrobasilar junction. Posterior inferior cerebral arteries not well evaluated. Basilar artery patent to its distal aspect. Superior cerebellar arteries patent bilaterally. Both of the posterior cerebral arteries arise knee basilar artery and are well opacified to their distal aspects. Venous sinuses: Grossly patent without evidence for venous sinus thrombosis. Anatomic variants: No anatomic variant.  No definite aneurysm. Delayed phase: Mild leptomeningeal enhancement about the right basal ganglia hemorrhage due to vascular congestion. No other pathologic enhancement. IMPRESSION: 1. Negative CTA of the head and neck. Please note that evaluation is somewhat limited by body habitus and timing of the contrast bolus. 2. Slight interval increase in size of acute right basal ganglia parenchymal hemorrhage, now  measuring approximately 16 cc in estimated volume, previously 15.4 cc. Slightly increased and more defined surrounding vasogenic edema. Similar 2 mm right-to-left shift. Supratentorial sulcal effacement concerning for cerebral edema, similar to prior. Electronically Signed   By: Rise Mu M.D.   On: 08/18/2015 06:59    STUDIES:  5/30 CT Head: 3.7 x 1.9 x 2 cm Rt basal ganglia ICH 5/31 CT Head:  No significant change. 5.2x1.9x3.0cm right basal ganglia hematoma. Mild mass effect. 5/31 TTE: Severe concentric LVH with EF 50-55%. No regional wall motion abnormalities. Grade 2 diastolic dysfunction. LA & RA normal in size. RV normal in size and function. No aortic stenosis or regurgitation. Aortic root normal in size. Trivial mitral regurgitation without stenosis. No pulmonic regurgitation or stenosis. No tricuspid regurgitation. No pericardial effusion. 6/01 Port CXR:  Increased bilateral interstitial markings. Unable to appreciate pleural effusion. ETT & CVL in stable position.  MICROBIOLOGY: MRSA PCR 5/31:  Negative   ANTIBIOTICS: None.  SIGNIFICANT EVENTS: 5/30 Admit 5/31 Start 3% NS  LINES/TUBES: OETT 7.5 5/31 >> R IJ CVL 5/31 >> OGT 5/31 >> FOLEY 5/31 >> PIV x3  ASSESSMENT / PLAN:  NEUROLOGIC A:   R Basal Ganglia ICH Sedation on Ventilator  P:   Management per Neurology RASS goal: -2 Propofol gtt Versed IV prn Fentanyl gtt & IV prn 3% HS Saline IV per Neurology  PULMONARY A: Compromised Airway - Secondary to ICH. Increasing Pulmonary Edema -  Secondary to Diastolic CHF & 3% HS. High risk for OSA  P:   Full vent support Daily SBT & WUA Holding on Lasix Diuresis until off 3% HS  CARDIOVASCULAR A:  Hypertensive Emergency Acute on Chronic Diastolic CHF - Likely secondary to 3% HS. Elevated Troponin I - Likely demand ischemia. No wall motion abnormalities on TTE. Grade 2 Diastolic CHF   P:  Vitals per Unit Protocol Monitoring on telemetry Cardene to  keep SBP < 160 Labetalol IV prn Lisinopril 10mg  QD Maxide-25 QD Holding on Lasix  RENAL A:   Hypernatremia - Medically induced. Hyperchloremia - Medically induced.  P:   Goal Na 150 - 155 per Neuro Trending electrolytes & renal function daily Monitoring Na q6hr Following UOP with Foley  GASTROINTESTINAL A:   Nutrition.  P:   Continuing Tube feeds Pepcid VT qhs  HEMATOLOGIC A:   Anemia - Mild. Leukocytosis - Mild.  P:  Trending cell counts daily w/ CBC SCDs for prophylaxis No chemical DVT ppx given ICH  INFECTIOUS A:   FUO - Likely secondary to ICH.   P:   Checking Blood, Urine, & Tracheal Aspirate Cultures Checking UA PCT per algorithm  Holding on antibiotics  ENDOCRINE A:   No acute issues.    P:   Monitor blood sugar on daily labs.  FAMILY UPDATES:  No family at bedside 6/2.  TODAY'S SUMMARY:  30 yo male with headache, slurred speech and Lt side weakness from Rt basal ganglia ICH. Continuing on treatment per neurology protocol. 3% hypertonic saline likely contributing to some acute on chronic diastolic congestive heart failure. Checking tracheal aspirate, blood, and urine cultures given fever although I suspect this is secondary to patient's ICH. Pink tinged secretions are quite probably secondary to decompensated digestive heart failure as well. Holding on diuresis until patient is able to transition off 3% hypertonic saline. Case discussed with neurology.  I have spent a total of 32 minutes of critical care time today caring for the patient and reviewing the patient's electronic medical record.  Donna Christen Jamison Neighbor, M.D. [facility] Pulmonary & Critical Care Pager:  [phone] After 3pm or if no response, call [phone] 1:35 PM 08/18/2015

## 2015-08-19 DIAGNOSIS — G936 Cerebral edema: Secondary | ICD-10-CM

## 2015-08-19 LAB — SODIUM
Sodium: 152 mmol/L — ABNORMAL HIGH (ref 135–145)
Sodium: 151 mmol/L — ABNORMAL HIGH (ref 135–145)
Sodium: 148 mmol/L — ABNORMAL HIGH (ref 135–145)

## 2015-08-19 LAB — RENAL FUNCTION PANEL
Albumin: 2.9 g/dL — ABNORMAL LOW (ref 3.5–5.0)
Anion gap: 8 (ref 5–15)
BUN: 18 mg/dL (ref 6–20)
CO2: 26 mmol/L (ref 22–32)
Calcium: 9 mg/dL (ref 8.9–10.3)
Chloride: 119 mmol/L — ABNORMAL HIGH (ref 101–111)
Creatinine, Ser: 1.08 mg/dL (ref 0.61–1.24)
GFR calc Af Amer: >60 mL/min (ref >60)
GFR calc non Af Amer: >60 mL/min (ref >60)
Glucose, Bld: 127 mg/dL — ABNORMAL HIGH (ref 65–99)
Phosphorus: 3.6 mg/dL (ref 2.5–4.6)
Potassium: 3.6 mmol/L (ref 3.5–5.1)
Sodium: 153 mmol/L — ABNORMAL HIGH (ref 135–145)

## 2015-08-19 LAB — BASIC METABOLIC PANEL
Anion gap: 5 (ref 5–15)
Anion gap: 6 (ref 5–15)
Anion gap: 5 (ref 5–15)
BUN: 18 mg/dL (ref 6–20)
BUN: 17 mg/dL (ref 6–20)
BUN: 18 mg/dL (ref 6–20)
CO2: 27 mmol/L (ref 22–32)
CO2: 27 mmol/L (ref 22–32)
CO2: 27 mmol/L (ref 22–32)
Calcium: 8.9 mg/dL (ref 8.9–10.3)
Calcium: 9.2 mg/dL (ref 8.9–10.3)
Calcium: 8.8 mg/dL — ABNORMAL LOW (ref 8.9–10.3)
Chloride: 120 mmol/L — ABNORMAL HIGH (ref 101–111)
Chloride: 118 mmol/L — ABNORMAL HIGH (ref 101–111)
Chloride: 115 mmol/L — ABNORMAL HIGH (ref 101–111)
Creatinine, Ser: 1.01 mg/dL (ref 0.61–1.24)
Creatinine, Ser: 0.9 mg/dL (ref 0.61–1.24)
Creatinine, Ser: 0.98 mg/dL (ref 0.61–1.24)
GFR calc Af Amer: >60 mL/min (ref >60)
GFR calc Af Amer: >60 mL/min (ref >60)
GFR calc Af Amer: >60 mL/min (ref >60)
GFR calc non Af Amer: >60 mL/min (ref >60)
GFR calc non Af Amer: >60 mL/min (ref >60)
GFR calc non Af Amer: >60 mL/min (ref >60)
Glucose, Bld: 112 mg/dL — ABNORMAL HIGH (ref 65–99)
Glucose, Bld: 128 mg/dL — ABNORMAL HIGH (ref 65–99)
Glucose, Bld: 116 mg/dL — ABNORMAL HIGH (ref 65–99)
Potassium: 3.8 mmol/L (ref 3.5–5.1)
Potassium: 3.7 mmol/L (ref 3.5–5.1)
Potassium: 3.7 mmol/L (ref 3.5–5.1)
Sodium: 152 mmol/L — ABNORMAL HIGH (ref 135–145)
Sodium: 151 mmol/L — ABNORMAL HIGH (ref 135–145)
Sodium: 147 mmol/L — ABNORMAL HIGH (ref 135–145)

## 2015-08-19 LAB — GLUCOSE, CAPILLARY
Glucose-Capillary: 121 mg/dL — ABNORMAL HIGH (ref 65–99)
Glucose-Capillary: 112 mg/dL — ABNORMAL HIGH (ref 65–99)
Glucose-Capillary: 99 mg/dL (ref 65–99)
Glucose-Capillary: 131 mg/dL — ABNORMAL HIGH (ref 65–99)
Glucose-Capillary: 110 mg/dL — ABNORMAL HIGH (ref 65–99)
Glucose-Capillary: 115 mg/dL — ABNORMAL HIGH (ref 65–99)

## 2015-08-19 LAB — CBC
HCT: 40.2 % (ref 39.0–52.0)
Hemoglobin: 12.2 g/dL — ABNORMAL LOW (ref 13.0–17.0)
MCH: 30.9 pg (ref 26.0–34.0)
MCHC: 30.3 g/dL (ref 30.0–36.0)
MCV: 101.8 fL — ABNORMAL HIGH (ref 78.0–100.0)
Platelets: 201 10*3/uL (ref 150–400)
RBC: 3.95 MIL/uL — ABNORMAL LOW (ref 4.22–5.81)
RDW: 14.4 % (ref 11.5–15.5)
WBC: 13.1 10*3/uL — ABNORMAL HIGH (ref 4.0–10.5)

## 2015-08-19 LAB — PROCALCITONIN: Procalcitonin: <0.1 ng/mL

## 2015-08-19 LAB — TRIGLYCERIDES: Triglycerides: 269 mg/dL — ABNORMAL HIGH (ref <150)

## 2015-08-19 LAB — URINE CULTURE
Culture: NO GROWTH
Special Requests: NORMAL

## 2015-08-19 LAB — MAGNESIUM: Magnesium: 1.7 mg/dL (ref 1.7–2.4)

## 2015-08-19 MED ORDER — VANCOMYCIN HCL 10 G IV SOLR
1250.0000 mg | Freq: Three times a day (TID) | INTRAVENOUS | Status: DC
  Administered 2015-08-19 – 2015-08-20 (×2): 1250 mg via INTRAVENOUS
  Filled 2015-08-19 (×4): qty 1250

## 2015-08-19 MED ORDER — PIPERACILLIN-TAZOBACTAM 3.375 G IVPB
3.3750 g | Freq: Three times a day (TID) | INTRAVENOUS | Status: DC
  Administered 2015-08-19 – 2015-08-20 (×5): 3.375 g via INTRAVENOUS
  Filled 2015-08-19 (×7): qty 50

## 2015-08-19 MED ORDER — VANCOMYCIN HCL 10 G IV SOLR
2000.0000 mg | Freq: Once | INTRAVENOUS | Status: AC
  Administered 2015-08-19: 2000 mg via INTRAVENOUS
  Filled 2015-08-19: qty 2000

## 2015-08-19 NOTE — Progress Notes (Signed)
STROKE TEAM PROGRESS NOTE   SUBJECTIVE (INTERVAL HISTORY) Patient remains sedated and intubated. Neurologically stable. Blood pressure has been adequately controlled. He remains restless when sedation is off. He is on hypertonic saline and serum sodium this morning is 151 .He has mild congestive heart failure and CCM is managing.He is spiked temperature and has had cultures sent and started on antibiotics. OBJECTIVE Temp:  [99.4 F (37.4 C)-103 F (39.4 C)] 99.4 F (37.4 C) (06/03 1200) Pulse Rate:  [98-129] 109 (06/03 1200) Cardiac Rhythm:  [-] Sinus tachycardia (06/03 1200) Resp:  [16-31] 20 (06/03 1200) BP: (135-183)/(78-124) 164/91 mmHg (06/03 1200) SpO2:  [92 %-100 %] 93 % (06/03 1200) FiO2 (%):  [40 %] 40 % (06/03 1200) Weight:  [292 lb 1.8 oz (132.5 kg)] 292 lb 1.8 oz (132.5 kg) (06/03 0325)  CBC:   Recent Labs Lab 08/15/15 2245  08/18/15 0550 08/19/15 0525  WBC 6.7  < > 11.3* 13.1*  NEUTROABS 3.4  --   --   --   HGB 14.0  < > 11.9* 12.2*  HCT 40.8  < > 39.0 40.2  MCV 92.9  < > 101.0* 101.8*  PLT 217  < > 207 201  < > = values in this interval not displayed.  Basic Metabolic Panel:  Recent Labs Lab 08/18/15 0550  08/19/15 0525 08/19/15 0920 08/19/15 1240  NA 154*  154*  < > 153* 152* 151*  K 3.8  --  3.6 3.8  --   CL 125*  --  119* 120*  --   CO2 24  --  26 27  --   GLUCOSE 108*  --  127* 112*  --   BUN 14  --  18 18  --   CREATININE 1.02  --  1.08 1.01  --   CALCIUM 8.6*  --  9.0 8.9  --   MG 2.0  --  1.7  --   --   PHOS 2.2*  --  3.6  --   --   < > = values in this interval not displayed.  Lipid Panel:     Component Value Date/Time   TRIG 269* 08/19/2015 0525   HgbA1c: No results found for: HGBA1C Urine Drug Screen:     Component Value Date/Time   LABOPIA NONE DETECTED 08/16/2015 0033   COCAINSCRNUR NONE DETECTED 08/16/2015 0033   LABBENZ NONE DETECTED 08/16/2015 0033   AMPHETMU NONE DETECTED 08/16/2015 0033   THCU NONE DETECTED 08/16/2015  0033   LABBARB NONE DETECTED 08/16/2015 0033     IMAGING  Ct Angio Head W/cm &/or Wo Cm  08/18/2015  CLINICAL DATA:  Emanation for intra cerebral hemorrhage. EXAM: CT ANGIOGRAPHY HEAD AND NECK TECHNIQUE: Multidetector CT imaging of the head and neck was performed using the standard protocol during bolus administration of intravenous contrast. Multiplanar CT image reconstructions and MIPs were obtained to evaluate the vascular anatomy. Carotid stenosis measurements (when applicable) are obtained utilizing NASCET criteria, using the distal internal carotid diameter as the denominator. CONTRAST:  100 cc of Isovue 370. COMPARISON:  Prior study from 08/16/2015. FINDINGS: CT HEAD Intraparenchymal hematoma centered at the right basal ganglia measures 54 x 20 x 30 mm (estimated volume 16 cc, slightly increased in size relative to previous). Slightly increased surrounding vasogenic edema which is also slightly more well-defined. A effacement of the right lateral ventricle, similar. Similar 2 mm of right-to-left shift. Size of the left lateral ventricle is stable without evidence for ventricular trapping. No evidence for  intraventricular extension. No new subarachnoid or other hemorrhage. Diffuse cerebral sulcal effacement is similar to prior, worrisome for cerebral edema. Remainder of the brain demonstrates no acute abnormality in is relatively normal in appearance. Scalp soft tissues within normal limits. No acute abnormality about the orbits. Mild exophthalmos is stable. Scattered mucosal thickening within the paranasal sinuses with fluid levels within the sphenoid sinuses. Patient is intubated. No mastoid effusion. Calvarium unchanged. CTA NECK Aortic arch: Examination somewhat limited by timing the contrast bolus and body habitus. Visualized aortic arch is normal in caliber with normal branch pattern. No high-grade stenosis at the origin of the great vessels. Insula note made of a bovine arch with common origin of  the left common and right brachiocephalic arteries. Visualized subclavian arteries patent bilaterally. Right carotid system: Right common carotid artery origin not well evaluated due to streak artifact from venous contrast and body habitus, but is grossly patent without stenosis. Right common carotid artery patent distally to the bifurcation. No significant atheromatous plaque about the right bifurcation. Right ICA patent from the bifurcation to the skullbase without stenosis, dissection, or occlusion. Right external carotid artery and its branches within normal limits. Left carotid system: Left common carotid artery patent from its origin to the bifurcation. No significant atheromatous plaque about the left bifurcation. Left ICA patent from the bifurcation to the skullbase without stenosis, dissection, or occlusion. Left external carotid artery and its branches within normal limits. Vertebral arteries:Both vertebral arteries arise from the subclavian arteries. Origin of the right vertebral artery not well visualized due to streak artifact from venous contrast and body habitus. Vertebral arteries patent within the neck without occlusion. No definite stenosis or evidence for dissection, although evaluation somewhat limited due to body habitus. Skeleton: No acute osseous abnormality. No worrisome lytic or blastic osseous lesions. Other neck: Mild atelectatic changes noted within the visualized lungs. Visualized lungs are otherwise grossly clear. Visualize mediastinum demonstrates no acute abnormality. Endotracheal tube terminates just above the carina. Thyroid gland normal. No significant adenopathy. No acute soft tissue abnormality. Right-sided central line in place within the right IJ. CTA HEAD Anterior circulation: Petrous, cavernous, and supraclinoid segments patent without definite flow limiting stenosis. Evaluation of the cavernous right ICA is somewhat limited due to asymmetric venous filling of the adjacent  right cavernous sinus. A1 segments patent. Anterior communicating artery normal. Anterior cerebral arteries well opacified to their distal aspects. M1 segments patent without stenosis or occlusion. MCA bifurcations normal. Distal MCA branches well opacified and symmetric. Mild mass on the right MCA branches due to the right basal ganglia hemorrhage. Posterior circulation: Vertebral arteries patent to the vertebrobasilar junction. Posterior inferior cerebral arteries not well evaluated. Basilar artery patent to its distal aspect. Superior cerebellar arteries patent bilaterally. Both of the posterior cerebral arteries arise knee basilar artery and are well opacified to their distal aspects. Venous sinuses: Grossly patent without evidence for venous sinus thrombosis. Anatomic variants: No anatomic variant.  No definite aneurysm. Delayed phase: Mild leptomeningeal enhancement about the right basal ganglia hemorrhage due to vascular congestion. No other pathologic enhancement. IMPRESSION: 1. Negative CTA of the head and neck. Please note that evaluation is somewhat limited by body habitus and timing of the contrast bolus. 2. Slight interval increase in size of acute right basal ganglia parenchymal hemorrhage, now measuring approximately 16 cc in estimated volume, previously 15.4 cc. Slightly increased and more defined surrounding vasogenic edema. Similar 2 mm right-to-left shift. Supratentorial sulcal effacement concerning for cerebral edema, similar to prior. Electronically Signed  By: Jeannine Boga M.D.   On: 08/18/2015 06:59   Ct Angio Neck W/cm &/or Wo/cm  08/18/2015  CLINICAL DATA:  Emanation for intra cerebral hemorrhage. EXAM: CT ANGIOGRAPHY HEAD AND NECK TECHNIQUE: Multidetector CT imaging of the head and neck was performed using the standard protocol during bolus administration of intravenous contrast. Multiplanar CT image reconstructions and MIPs were obtained to evaluate the vascular anatomy. Carotid  stenosis measurements (when applicable) are obtained utilizing NASCET criteria, using the distal internal carotid diameter as the denominator. CONTRAST:  100 cc of Isovue 370. COMPARISON:  Prior study from 08/16/2015. FINDINGS: CT HEAD Intraparenchymal hematoma centered at the right basal ganglia measures 54 x 20 x 30 mm (estimated volume 16 cc, slightly increased in size relative to previous). Slightly increased surrounding vasogenic edema which is also slightly more well-defined. A effacement of the right lateral ventricle, similar. Similar 2 mm of right-to-left shift. Size of the left lateral ventricle is stable without evidence for ventricular trapping. No evidence for intraventricular extension. No new subarachnoid or other hemorrhage. Diffuse cerebral sulcal effacement is similar to prior, worrisome for cerebral edema. Remainder of the brain demonstrates no acute abnormality in is relatively normal in appearance. Scalp soft tissues within normal limits. No acute abnormality about the orbits. Mild exophthalmos is stable. Scattered mucosal thickening within the paranasal sinuses with fluid levels within the sphenoid sinuses. Patient is intubated. No mastoid effusion. Calvarium unchanged. CTA NECK Aortic arch: Examination somewhat limited by timing the contrast bolus and body habitus. Visualized aortic arch is normal in caliber with normal branch pattern. No high-grade stenosis at the origin of the great vessels. Insula note made of a bovine arch with common origin of the left common and right brachiocephalic arteries. Visualized subclavian arteries patent bilaterally. Right carotid system: Right common carotid artery origin not well evaluated due to streak artifact from venous contrast and body habitus, but is grossly patent without stenosis. Right common carotid artery patent distally to the bifurcation. No significant atheromatous plaque about the right bifurcation. Right ICA patent from the bifurcation to the  skullbase without stenosis, dissection, or occlusion. Right external carotid artery and its branches within normal limits. Left carotid system: Left common carotid artery patent from its origin to the bifurcation. No significant atheromatous plaque about the left bifurcation. Left ICA patent from the bifurcation to the skullbase without stenosis, dissection, or occlusion. Left external carotid artery and its branches within normal limits. Vertebral arteries:Both vertebral arteries arise from the subclavian arteries. Origin of the right vertebral artery not well visualized due to streak artifact from venous contrast and body habitus. Vertebral arteries patent within the neck without occlusion. No definite stenosis or evidence for dissection, although evaluation somewhat limited due to body habitus. Skeleton: No acute osseous abnormality. No worrisome lytic or blastic osseous lesions. Other neck: Mild atelectatic changes noted within the visualized lungs. Visualized lungs are otherwise grossly clear. Visualize mediastinum demonstrates no acute abnormality. Endotracheal tube terminates just above the carina. Thyroid gland normal. No significant adenopathy. No acute soft tissue abnormality. Right-sided central line in place within the right IJ. CTA HEAD Anterior circulation: Petrous, cavernous, and supraclinoid segments patent without definite flow limiting stenosis. Evaluation of the cavernous right ICA is somewhat limited due to asymmetric venous filling of the adjacent right cavernous sinus. A1 segments patent. Anterior communicating artery normal. Anterior cerebral arteries well opacified to their distal aspects. M1 segments patent without stenosis or occlusion. MCA bifurcations normal. Distal MCA branches well opacified and symmetric. Mild mass  on the right MCA branches due to the right basal ganglia hemorrhage. Posterior circulation: Vertebral arteries patent to the vertebrobasilar junction. Posterior inferior  cerebral arteries not well evaluated. Basilar artery patent to its distal aspect. Superior cerebellar arteries patent bilaterally. Both of the posterior cerebral arteries arise knee basilar artery and are well opacified to their distal aspects. Venous sinuses: Grossly patent without evidence for venous sinus thrombosis. Anatomic variants: No anatomic variant.  No definite aneurysm. Delayed phase: Mild leptomeningeal enhancement about the right basal ganglia hemorrhage due to vascular congestion. No other pathologic enhancement. IMPRESSION: 1. Negative CTA of the head and neck. Please note that evaluation is somewhat limited by body habitus and timing of the contrast bolus. 2. Slight interval increase in size of acute right basal ganglia parenchymal hemorrhage, now measuring approximately 16 cc in estimated volume, previously 15.4 cc. Slightly increased and more defined surrounding vasogenic edema. Similar 2 mm right-to-left shift. Supratentorial sulcal effacement concerning for cerebral edema, similar to prior. Electronically Signed   By: Jeannine Boga M.D.   On: 08/18/2015 06:59   2D Echocardiogram  Left ventricle: The cavity size was normal. There was severe concentric hypertrophy. Systolic function was normal. The estimated ejection fraction was in the range of 50% to 55%. Wall motion was normal; there were no regional wall motion abnormalities. Features are consistent with a pseudonormal left ventricular filling pattern, with concomitant abnormal relaxation and increased filling pressure (grade 2 diastolic dysfunction). Doppler parameters are consistent with high ventricular filling pressure.   PHYSICAL EXAM GENERAL- Intubated, sedated, appears as stated age, not in any distress, Obese. HEENT- Atraumatic, normocephalic, Pupils constricted, reactive and equal, disconjugate, EOMI CARDIAC- RRR, no murmurs, rubs or gallops. RESP- Moving equal volumes of air, and clear to auscultation bilaterally,  anteriorly ABDOMEN- Soft, obese EXTREMITIES- pulse 2+, symmetric, no pedal edema. SKIN- Warm, dry, No rash or lesion. NEUROLOGICAL EXAM : intubated and sedated- but able to follow commands on the right side. eyes closed, Pupils 2 mm reactive. Fundi nor visualized.Dolls eye present, slightly dysconjugate gaze with mild exotropia left eye.Corneal reflex intact- but more brisk on the right, no obvious facial assym, moving RUE, RLE, LLE to pain, withdraws to pain, some purposeful movement. Minimal LUE movement to pain   ASSESSMENT/PLAN Mr. PER BEAGLEY is a 30 y.o. male with history of hypertension presenting with L sided weakness. CT showed a L basal ganglia hemorrhage, with change in mental status, pt had repeat Ct head which showed increase size of hrr, and increased mass effect, pt and was subsequently intubated.  Stroke:  Non-dominant right basal ganglia hemorrhage secondary to hypertension with resultant neuro worsening, hemorrhage extension and cerebral edema  Resultant  L hemiparesis, VDRF, HA, dysphagia  Initial CT R BG hemorrhage 3.7x1.9x2, mild edema & mass effect  Repeat CT with neuro worsening increased size of R BG hmg w/ increased mass effect. 78m R shift, supratentorial sulcal effacement, concern for cerebral edema.   Repeat CT same this afternoon  Stable hematoma. Unchanged shift or edema. Vol 15.4cc  CTA head & neck ordered for tomorrow  2D Echo  EF 50-55%. No source of embolus   SCDs for VTE prophylaxis  Diet NPO time specified, consider starting tube feeds.  No antithrombotic prior to admission  Ongoing aggressive stroke risk factor management  Therapy recommendations:  pending   Disposition:  pending   Acute respiratory failure  Secondary to hemorrhage  Intubated in the ED  CCM managing vent, On propofol and fentanyl  Cardiomegaly and  abnormal EKG  Seen on chest xray, most likely related to HTN heart dx  Echo severe concentric hypertrophy. EF 50-55%.  Grade II diastolic HF  Flat elevated troponin, t wave inversions in inferior lat leads.  Might need cardiac eval at some point.   Induced Hypernatremia  To prevent cerebral edema  CL placed and started on 3% NA drip   Na 136 >154 this am .  Na goal - 150- 155.  At risk for pulmonary edema  Dysphagia  secondary to stroke  Place NG, tube feeds per PCCM.  Hypertensive Emergency  BP 182/124 on arrival in setting of acute neurologic symptoms  BP < 160 since 0400  On cardene  SBP goal < 160   Other Stroke Risk Factors  Morbid Obesity, Body mass index is 45.74 kg/(m^2).   Other [facility] day # 3   I have personally examined this patient, reviewed notes, independently viewed imaging studies, participated in medical decision making and plan of care. I have made any additions or clarifications directly to the above note. Continue strict blood pressure control and sedation. Hypertonic saline with sodium goal 150-155. Plan Continue hypertonic saline drip at 50cc/hr. Aggressive temp control and maintain normothermia.  I had a long discussion at the bedside with patient's mother and dad regarding his condition, plan of care and answered questions. Dr Ashok Cordia to decide on extubation   after 2-3 days versus tracheostomy. Plan repeat CT head tomorrow morning and if cerebral edema decreased may discontinue hypertonic saline This patient is critically ill and at significant risk of neurological worsening, death and care requires constant monitoring of vital signs, hemodynamics,respiratory and cardiac monitoring, extensive review of multiple databases, frequent neurological assessment, discussion with family, other specialists and medical decision making of high complexity.I have made any additions or clarifications directly to the above note.This critical care time does not reflect procedure time, or teaching time or supervisory time of PA/NP/Med Resident etc but could  involve care discussion time.  I spent 32 minutes of neurocritical care time  in the care of  this patient.      Antony Contras, MD Medical Director [facility] Pager: [phone] 08/19/2015 1:15 PM

## 2015-08-19 NOTE — Progress Notes (Signed)
Pharmacy Antibiotic Note  Brad Mcgee is a 30 y.o. male admitted on 08/15/2015 with pneumonia.  Pharmacy has been consulted for vancomycin/Zosyn dosing.  No abx previously. Tmax 103F, tachycardic to 110s. WBC rising to 13.1, PCT < 0.1. Cr 1.08, CrCl > 100 ml/min.  Plan: Vancomycin 2000 mg x 1 loading dose Vancomycin 1250 IV every 8 hours.  Goal trough 15-20 mcg/mL. Zosyn 3.375g IV q8h (4 hour infusion).  F/u cx, VT at steady state, s/sx clinical improvement  Height: 5\' 7"  (170.2 cm) Weight: 292 lb 1.8 oz (132.5 kg) IBW/kg (Calculated) : 66.1  Temp (24hrs), Avg:101.4 F (38.6 C), Min:99.4 F (37.4 C), Max:103 F (39.4 C)   Recent Labs Lab 08/15/15 2245 08/16/15 0417 08/17/15 0500 08/18/15 0550 08/19/15 0525  WBC 6.7 8.0 7.1 11.3* 13.1*  CREATININE 1.20 1.04  --  1.02 1.08    Estimated Creatinine Clearance: 129.9 mL/min (by C-G formula based on Cr of 1.08).    Allergies  Allergen Reactions  . Bee Venom Anaphylaxis  . Coconut Oil Anaphylaxis    Antimicrobials this admission: 6/3 vancomycin >>  6/3 Zosyn >>   Dose adjustments this admission: N/a  Microbiology results: 6/2 BCx: sent 6/2 UCx: sent  6/ Sputum: sent  5/13 MRSA PCR: neg  Thank you for allowing pharmacy to be a part of this patient's care.  Brad Mcgee 08/19/2015 8:47 AM

## 2015-08-19 NOTE — Progress Notes (Signed)
PULMONARY / CRITICAL CARE MEDICINE   Name: Brad Mcgee MRN: [id number] DOB: 07/09/1985    ADMISSION DATE:  08/15/2015 CONSULTATION DATE:  08/16/2015  REFERRING MD:  Dr. Amada JupiterKirkpatrick  CHIEF COMPLAINT:  Left side weakness  HISTORY OF PRESENT ILLNESS:  Hx from chart. 30 yo male with slurred speech and Lt sided weakness.  This was associated with headache.  He was intubated for airway protection.  He had CT head which showed 3.7 x 1.9 x 2 cm Rt basal ganglia ICH which then increased to 4.5 x 1.9 x 3.4 cm ICH on f/u CT head.  His BP was 187/106.  SUBJECTIVE: No acute events overnight. Currently heavily sedated  REVIEW OF SYSTEMS:  Unable to obtain given intubation.  VITAL SIGNS: BP 157/102 mmHg  Pulse 110  Temp(Src) 102.6 F (39.2 C) (Axillary)  Resp 19  Ht 5\' 7"  (1.702 m)  Wt 292 lb 1.8 oz (132.5 kg)  BMI 45.74 kg/m2  SpO2 100%  HEMODYNAMICS:    VENTILATOR SETTINGS: Vent Mode:  [-] PRVC FiO2 (%):  [40 %] 40 % Set Rate:  [16 bmp-169 bmp] 16 bmp Vt Set:  [600 mL] 600 mL PEEP:  [5 cmH20] 5 cmH20 Plateau Pressure:  [18 cmH20-25 cmH20] 25 cmH20  INTAKE / OUTPUT: I/O last 3 completed shifts: In: 435648 [I.V.:5078; NG/GT:570] Out: 6805 [Urine:5755; Emesis/NG output:1050]  PHYSICAL EXAMINATION: General:  Obese. No distress. No family at bedside. Sedated Neuro:  Pupils pinpoint and symmetric. No spontaneous movements. Opens eyes with stimulation. HEENT:  ETT in place. No scleral icterus or injection. Cardiovascular:  Regular rate & rhythm. No appreciable JVD given body habitus. Lungs:  Distant breath sounds. Symmetric chest rise on vent.Diminshed in bases Abdomen:  Soft. Protuberant. Normal bowel sounds. Integument:  Warm & dry. No rash on exposed skin.  LABS:  BMET  Recent Labs Lab 08/16/15 0417  08/18/15 0550  08/18/15 1848 08/18/15 2340 08/19/15 0525  NA 136  < > 154*  154*  < > 155* 152* 153*  K 4.4  --  3.8  --   --   --  3.6  CL 105  --  125*  --   --   --   119*  CO2 25  --  24  --   --   --  26  BUN 13  --  14  --   --   --  18  CREATININE 1.04  --  1.02  --   --   --  1.08  GLUCOSE 97  --  108*  --   --   --  127*  < > = values in this interval not displayed.  Electrolytes  Recent Labs Lab 08/16/15 0417 08/18/15 0550 08/19/15 0525  CALCIUM 8.8* 8.6* 9.0  MG  --  2.0 1.7  PHOS  --  2.2* 3.6    CBC  Recent Labs Lab 08/17/15 0500 08/18/15 0550 08/19/15 0525  WBC 7.1 11.3* 13.1*  HGB 12.2* 11.9* 12.2*  HCT 38.6* 39.0 40.2  PLT 196 207 201    Coag's  Recent Labs Lab 08/15/15 2245  APTT 32  INR 0.98    Sepsis Markers  Recent Labs Lab 08/18/15 1432 08/19/15 0525  PROCALCITON 0.10 <0.10    ABG  Recent Labs Lab 08/16/15 0434  PHART 7.333*  PCO2ART 45.2*  PO2ART 73.9*    Liver Enzymes  Recent Labs Lab 08/15/15 2245 08/18/15 0550 08/19/15 0525  AST 23  --   --  ALT 20  --   --   ALKPHOS 58  --   --   BILITOT 0.4  --   --   ALBUMIN 4.3 3.0* 2.9*    Cardiac Enzymes  Recent Labs Lab 08/15/15 2245 08/16/15 0435 08/16/15 1030  TROPONINI 0.05* 0.06* 0.06*    Glucose  Recent Labs Lab 08/18/15 0815 08/18/15 1140 08/18/15 1633 08/18/15 2035 08/19/15 0121 08/19/15 0340  GLUCAP 109* 115* 94 108* 121* 112*    Imaging No results found.  STUDIES:  5/30 CT Head: 3.7 x 1.9 x 2 cm Rt basal ganglia ICH 5/31 CT Head:  No significant change. 5.2x1.9x3.0cm right basal ganglia hematoma. Mild mass effect. 5/31 TTE: Severe concentric LVH with EF 50-55%. No regional wall motion abnormalities. Grade 2 diastolic dysfunction. LA & RA normal in size. RV normal in size and function. No aortic stenosis or regurgitation. Aortic root normal in size. Trivial mitral regurgitation without stenosis. No pulmonic regurgitation or stenosis. No tricuspid regurgitation. No pericardial effusion. 6/01 Port CXR:  Increased bilateral interstitial markings. Unable to appreciate pleural effusion. ETT & CVL in stable  position.  MICROBIOLOGY: MRSA PCR 5/31:  Negative  Sputum 6/2 GPC>> Blood 6/2>> Sputum 6/2>> ANTIBIOTICS: 6/3 vanc>> 6/3 zoysn>>   SIGNIFICANT EVENTS: 5/30 Admit 5/31 Start 3% NS>>  LINES/TUBES: OETT 7.5 5/31 >> R IJ CVL 5/31 >> OGT 5/31 >> FOLEY 5/31 >> PIV x3  ASSESSMENT / PLAN:  NEUROLOGIC A:   R Basal Ganglia ICH Sedation on Ventilator  P:   Management per Neurology RASS goal: -2 Propofol gtt Versed IV prn Fentanyl gtt & IV prn 3% HS Saline IV per Neurology  PULMONARY A: Compromised Airway - Secondary to ICH. Increasing Pulmonary Edema - Secondary to Diastolic CHF & 3% HS. High risk for OSA  P:   Full vent support Daily SBT & WUA Holding on Lasix Diuresis until off 3% HS Consider earl trach due to injury and body habitus  CARDIOVASCULAR A:  Hypertensive Emergency Acute on Chronic Diastolic CHF - Likely secondary to 3% HS. Elevated Troponin I - Likely demand ischemia. No wall motion abnormalities on TTE. Grade 2 Diastolic CHF   P:  Vitals per Unit Protocol Monitoring on telemetry Cardene to keep SBP < 160 Labetalol IV prn Lisinopril  QD Maxide-25 QD Holding on Lasix while on 3% saline  RENAL  Recent Labs Lab 08/18/15 1848 08/18/15 2340 08/19/15 0525  NA 155* 152* 153*    A:   Hypernatremia - Medically induced. Hyperchloremia - Medically induced.  P:   Goal Na 150 - 155 per Neuro Trending electrolytes & renal function daily Monitoring Na q6hr Following UOP with Foley  GASTROINTESTINAL A:   Nutrition.  P:   Continuing Tube feeds Pepcid VT qhs  HEMATOLOGIC  Recent Labs  08/18/15 0550 08/19/15 0525  HGB 11.9* 12.2*   Wbc 11.2->13.1 A:   Anemia - Mild. Leukocytosis - Mild.  P:  Trending cell counts daily w/ CBC SCDs for prophylaxis No chemical DVT ppx given ICH  INFECTIOUS A:   FUO - Likely secondary to ICH.   P:   Checking Blood, Urine, & Tracheal Aspirate Cultures Checking UA PCT <0.10 Empiric  V/Z 6/2>>  ENDOCRINE CBG (last 3)   Recent Labs  08/18/15 2035 08/19/15 0121 08/19/15 0340  GLUCAP 108* 121* 112*     A:   No acute issues.    P:   Monitor blood sugar on daily labs.  FAMILY UPDATES:  No family at bedside 6/2.  TODAY'S SUMMARY:  30 yo male with headache, slurred speech and Lt side weakness from Rt basal ganglia ICH. Continuing on treatment per neurology protocol. 3% hypertonic saline likely contributing to some acute on chronic diastolic congestive heart failure. Checking tracheal aspirate, blood, and urine cultures given fever although I suspect this is secondary to patient's ICH. Pink tinged secretions are quite probably secondary to decompensated digestive heart failure as well. Holding on diuresis until patient is able to transition off 3% hypertonic saline. He remains sedated, fio2 at 40% but no weaning. He will most likely need a tracheostomy due to body habitus. Now with fever 103.0 and 102.6 and wbc rising. Will start empiric abx till cultures come back.   Brett Canales Minor ACNP [facility] Pager [phone] till 3 pm If no answer page [phone] 08/19/2015, 7:49 AM   Attending note: I have seen and examined the patient with nurse practitioner/resident and agree with the note. History, labs and imaging reviewed.  30 Y/O with ICH, cerebral edema Starting vanco, zosyn for fevers, staph in trach aspirate.  Continue 3% saline Continue vent support.  Rest of plan as above.  Critical care time- 35 mins. This represents my time independent of the NPs time taking care of the patient.  Chilton Greathouse MD [facility] Pager [phone] If no answer or after 3pm call: [phone] 08/19/2015, 1:30 PM

## 2015-08-20 ENCOUNTER — Inpatient Hospital Stay (HOSPITAL_COMMUNITY): Payer: 59

## 2015-08-20 LAB — BASIC METABOLIC PANEL
Anion gap: 7 (ref 5–15)
BUN: 20 mg/dL (ref 6–20)
CO2: 27 mmol/L (ref 22–32)
Calcium: 9 mg/dL (ref 8.9–10.3)
Chloride: 115 mmol/L — ABNORMAL HIGH (ref 101–111)
Creatinine, Ser: 1.02 mg/dL (ref 0.61–1.24)
GFR calc Af Amer: >60 mL/min (ref >60)
GFR calc non Af Amer: >60 mL/min (ref >60)
Glucose, Bld: 137 mg/dL — ABNORMAL HIGH (ref 65–99)
Potassium: 3.6 mmol/L (ref 3.5–5.1)
Sodium: 149 mmol/L — ABNORMAL HIGH (ref 135–145)

## 2015-08-20 LAB — CULTURE, RESPIRATORY

## 2015-08-20 LAB — GLUCOSE, CAPILLARY
Glucose-Capillary: 115 mg/dL — ABNORMAL HIGH (ref 65–99)
Glucose-Capillary: 118 mg/dL — ABNORMAL HIGH (ref 65–99)
Glucose-Capillary: 120 mg/dL — ABNORMAL HIGH (ref 65–99)
Glucose-Capillary: 121 mg/dL — ABNORMAL HIGH (ref 65–99)
Glucose-Capillary: 122 mg/dL — ABNORMAL HIGH (ref 65–99)
Glucose-Capillary: 134 mg/dL — ABNORMAL HIGH (ref 65–99)
Glucose-Capillary: 87 mg/dL (ref 65–99)

## 2015-08-20 LAB — RENAL FUNCTION PANEL
Albumin: 2.6 g/dL — ABNORMAL LOW (ref 3.5–5.0)
Anion gap: 8 (ref 5–15)
BUN: 22 mg/dL — ABNORMAL HIGH (ref 6–20)
CO2: 27 mmol/L (ref 22–32)
Calcium: 8.7 mg/dL — ABNORMAL LOW (ref 8.9–10.3)
Chloride: 116 mmol/L — ABNORMAL HIGH (ref 101–111)
Creatinine, Ser: 1.06 mg/dL (ref 0.61–1.24)
GFR calc Af Amer: >60 mL/min (ref >60)
GFR calc non Af Amer: >60 mL/min (ref >60)
Glucose, Bld: 117 mg/dL — ABNORMAL HIGH (ref 65–99)
Phosphorus: 4.7 mg/dL — ABNORMAL HIGH (ref 2.5–4.6)
Potassium: 4.1 mmol/L (ref 3.5–5.1)
Sodium: 151 mmol/L — ABNORMAL HIGH (ref 135–145)

## 2015-08-20 LAB — CBC
HCT: 39.8 % (ref 39.0–52.0)
Hemoglobin: 12.3 g/dL — ABNORMAL LOW (ref 13.0–17.0)
MCH: 30.8 pg (ref 26.0–34.0)
MCHC: 30.9 g/dL (ref 30.0–36.0)
MCV: 99.7 fL (ref 78.0–100.0)
Platelets: 211 10*3/uL (ref 150–400)
RBC: 3.99 MIL/uL — ABNORMAL LOW (ref 4.22–5.81)
RDW: 13.7 % (ref 11.5–15.5)
WBC: 10.2 10*3/uL (ref 4.0–10.5)

## 2015-08-20 LAB — CULTURE, RESPIRATORY W GRAM STAIN: Special Requests: NORMAL

## 2015-08-20 LAB — SODIUM
Sodium: 150 mmol/L — ABNORMAL HIGH (ref 135–145)
Sodium: 152 mmol/L — ABNORMAL HIGH (ref 135–145)
Sodium: 150 mmol/L — ABNORMAL HIGH (ref 135–145)
Sodium: 152 mmol/L — ABNORMAL HIGH (ref 135–145)

## 2015-08-20 LAB — PROCALCITONIN: Procalcitonin: 0.24 ng/mL

## 2015-08-20 LAB — MAGNESIUM: Magnesium: 1.9 mg/dL (ref 1.7–2.4)

## 2015-08-20 LAB — TRIGLYCERIDES: Triglycerides: 316 mg/dL — ABNORMAL HIGH (ref <150)

## 2015-08-20 MED ORDER — ENOXAPARIN SODIUM 40 MG/0.4ML ~~LOC~~ SOLN
40.0000 mg | SUBCUTANEOUS | Status: DC
  Administered 2015-08-20 – 2015-08-30 (×11): 40 mg via SUBCUTANEOUS
  Filled 2015-08-20 (×11): qty 0.4

## 2015-08-20 MED ORDER — CEFAZOLIN SODIUM-DEXTROSE 2-4 GM/100ML-% IV SOLN
2.0000 g | Freq: Three times a day (TID) | INTRAVENOUS | Status: DC
  Administered 2015-08-20 – 2015-08-24 (×11): 2 g via INTRAVENOUS
  Filled 2015-08-20 (×15): qty 100

## 2015-08-20 MED ORDER — CEFAZOLIN SODIUM-DEXTROSE 2-4 GM/100ML-% IV SOLN
2.0000 g | Freq: Three times a day (TID) | INTRAVENOUS | Status: DC
  Filled 2015-08-20 (×3): qty 100

## 2015-08-20 NOTE — Progress Notes (Signed)
Speech Language Pathology Discharge Patient Details Name: Brad Mcgee MRN: [id number] DOB: 09/22/1985 Today's Date: 08/20/2015 Time:  -     Patient discharged from SLP services secondary to continues to be intubated. Please reorder when and if appropriate  Please see latest therapy progress note for current level of functioning and progress toward goals.    Progress and discharge plan discussed with patient and/or caregiver: Patient unable to participate in discharge planning and no caregivers available  GO     Royce MacadamiaLitaker, Jinna Weinman Willis 08/20/2015, 5:34 PM

## 2015-08-20 NOTE — Progress Notes (Signed)
PULMONARY / CRITICAL CARE MEDICINE   Name: Brad Mcgee MRN: [id number] DOB: 09-02-1985    ADMISSION DATE:  08/15/2015 CONSULTATION DATE:  08/16/2015  REFERRING MD:  Dr. Amada Jupiter  CHIEF COMPLAINT:  Left side weakness  HISTORY OF PRESENT ILLNESS:  Hx from chart. 30 yo male with slurred speech and Lt sided weakness.  This was associated with headache.  He was intubated for airway protection.  He had CT head which showed 3.7 x 1.9 x 2 cm Rt basal ganglia ICH which then increased to 4.5 x 1.9 x 3.4 cm ICH on f/u CT head.  His BP was 187/106.  SUBJECTIVE: No acute events overnight. Sputum growing SA pan sensitive  REVIEW OF SYSTEMS:  Unable to obtain given intubation.  VITAL SIGNS: BP 141/89 mmHg  Pulse 102  Temp(Src) 99.4 F (37.4 C) (Oral)  Resp 20  Ht 5\' 7"  (1.702 m)  Wt 292 lb 5.3 oz (132.6 kg)  BMI 45.77 kg/m2  SpO2 97%  HEMODYNAMICS:    VENTILATOR SETTINGS: Vent Mode:  [-] PRVC FiO2 (%):  [40 %] 40 % Set Rate:  [16 bmp] 16 bmp Vt Set:  [600 mL] 600 mL PEEP:  [5 cmH20] 5 cmH20 Plateau Pressure:  [19 cmH20-21 cmH20] 19 cmH20  INTAKE / OUTPUT: I/O last 3 completed shifts: In: 5511.2 [I.V.:3951.2; NG/GT:360; IV Piggyback:1200] Out: 6750 [Urine:6750]  PHYSICAL EXAMINATION: General:  Obese. No distress. No family at bedside. Sedated Neuro:  Pupils pinpoint and symmetric. No spontaneous movements. Opens eyes with stimulation. Follows commands when not sedated HEENT:  ETT in place. No scleral icterus or injection. Cardiovascular:  Regular rate & rhythm. No appreciable JVD given body habitus. Lungs:  Distant breath sounds. Symmetric chest rise on vent.Diminshed in bases Abdomen:  Soft. Protuberant. Normal bowel sounds. Integument:  Warm & dry. No rash on exposed skin.  LABS:  BMET  Recent Labs Lab 08/19/15 2016 08/20/15 0110 08/20/15 0548  NA 147* 149*  150* 152*  151*  K 3.7 3.6 4.1  CL 115* 115* 116*  CO2 27 27 27   BUN 18 20 22*  CREATININE 0.98 1.02  1.06  GLUCOSE 116* 137* 117*    Electrolytes  Recent Labs Lab 08/18/15 0550 08/19/15 0525  08/19/15 2016 08/20/15 0110 08/20/15 0548  CALCIUM 8.6* 9.0  < > 8.8* 9.0 8.7*  MG 2.0 1.7  --   --   --  1.9  PHOS 2.2* 3.6  --   --   --  4.7*  < > = values in this interval not displayed.  CBC  Recent Labs Lab 08/18/15 0550 08/19/15 0525 08/20/15 0548  WBC 11.3* 13.1* 10.2  HGB 11.9* 12.2* 12.3*  HCT 39.0 40.2 39.8  PLT 207 201 211    Coag's  Recent Labs Lab 08/15/15 2245  APTT 32  INR 0.98    Sepsis Markers  Recent Labs Lab 08/18/15 1432 08/19/15 0525 08/20/15 0548  PROCALCITON 0.10 <0.10 0.24    ABG  Recent Labs Lab 08/16/15 0434  PHART 7.333*  PCO2ART 45.2*  PO2ART 73.9*    Liver Enzymes  Recent Labs Lab 08/15/15 2245 08/18/15 0550 08/19/15 0525 08/20/15 0548  AST 23  --   --   --   ALT 20  --   --   --   ALKPHOS 58  --   --   --   BILITOT 0.4  --   --   --   ALBUMIN 4.3 3.0* 2.9* 2.6*    Cardiac Enzymes  Recent Labs Lab 08/15/15 2245 08/16/15 0435 08/16/15 1030  TROPONINI 0.05* 0.06* 0.06*    Glucose  Recent Labs Lab 08/19/15 1143 08/19/15 1618 08/19/15 1911 08/20/15 0020 08/20/15 0434 08/20/15 0807  GLUCAP 131* 110* 115* 134* 115* 122*    Imaging Ct Head Wo Contrast  08/20/2015  ADDENDUM REPORT: 08/20/2015 07:36 ADDENDUM: There is herniation of the cerebellar tonsils through the foramen magnum. This is stable when compared to previous studies and likely reflects Chiari malformation although herniation from mass-effect cannot be completely excluded. Electronically Signed   By: Charlett Nose M.D.   On: 08/20/2015 07:36  08/20/2015  CLINICAL DATA:  Follow-up intra cerebral hemorrhage EXAM: CT HEAD WITHOUT CONTRAST TECHNIQUE: Contiguous axial images were obtained from the base of the skull through the vertex without intravenous contrast. COMPARISON:  None. FINDINGS: Again noted is the acute hemorrhage with in the right basal  ganglia measuring 5.4 by 1.9 cm, not significantly changed. Mild surrounding vasogenic edema. There is mass-effect on the right lateral ventricle with slight midline shift of 2 mm. No hydrocephalus. No new hemorrhage. Mucosal thickening in the paranasal sinuses. Mastoid air cells are clear. IMPRESSION: Stable right basal ganglia hemorrhage with mass-effect and slight midline shift. Electronically Signed: By: Charlett Nose M.D. On: 08/20/2015 07:16   Dg Chest Port 1 View  08/20/2015  CLINICAL DATA:  Respiratory failure EXAM: PORTABLE CHEST 1 VIEW COMPARISON:  08/17/2015 FINDINGS: Support devices are stable. There is cardiomegaly with vascular congestion and probable interstitial edema. No real change since prior study. No visible effusions or acute bony abnormality. IMPRESSION: Mild interstitial edema/CHF. Electronically Signed   By: Charlett Nose M.D.   On: 08/20/2015 07:31    STUDIES:  5/30 CT Head: 3.7 x 1.9 x 2 cm Rt basal ganglia ICH 5/31 CT Head:  No significant change. 5.2x1.9x3.0cm right basal ganglia hematoma. Mild mass effect. 5/31 TTE: Severe concentric LVH with EF 50-55%. No regional wall motion abnormalities. Grade 2 diastolic dysfunction. LA & RA normal in size. RV normal in size and function. No aortic stenosis or regurgitation. Aortic root normal in size. Trivial mitral regurgitation without stenosis. No pulmonic regurgitation or stenosis. No tricuspid regurgitation. No pericardial effusion. 6/01 Port CXR:  Increased bilateral interstitial markings. Unable to appreciate pleural effusion. ETT & CVL in stable position.  MICROBIOLOGY: MRSA PCR 5/31:  Negative  Sputum 6/2 GPC>>SA Blood 6/2>> Sputum 6/2>>SA ANTIBIOTICS: 6/3 vanc>>6/4 6/3 zoysn>>   SIGNIFICANT EVENTS: 5/30 Admit 5/31 Start 3% NS>>  LINES/TUBES: OETT 7.5 5/31 >> R IJ CVL 5/31 >> OGT 5/31 >> FOLEY 5/31 >> PIV x3  ASSESSMENT / PLAN:  NEUROLOGIC A:   R Basal Ganglia ICH Sedation on Ventilator  P:    Management per Neurology RASS goal: -2 Propofol gtt Versed IV prn Fentanyl gtt & IV prn 3% HS Saline IV per Neurology  PULMONARY A: Compromised Airway - Secondary to ICH. Increasing Pulmonary Edema - Secondary to Diastolic CHF & 3% HS. High risk for OSA  P:   Full vent support Daily SBT & WUA Holding on Lasix Diuresis until off 3% HS Consider earl trach due to injury and body habitus May try trial extubation 6/5 and if fails then trach  CARDIOVASCULAR A:  Hypertensive Emergency Acute on Chronic Diastolic CHF - Likely secondary to 3% HS. Elevated Troponin I - Likely demand ischemia. No wall motion abnormalities on TTE. Grade 2 Diastolic CHF   P:  Vitals per Unit Protocol Monitoring on telemetry Cardene to keep SBP < 160  Labetalol IV prn Lisinopril 10mg  QD Maxide-25 QD Holding on Lasix while on 3% saline  RENAL  Recent Labs Lab 08/19/15 2016 08/20/15 0110 08/20/15 0548  NA 147* 149*  150* 152*  151*    A:   Hypernatremia - Medically induced. Hyperchloremia - Medically induced.  P:   Goal Na 150 - 155 per Neuro Trending electrolytes & renal function daily Monitoring Na q6hr Following UOP with Foley  GASTROINTESTINAL A:   Nutrition.  P:   Continuing Tube feeds Pepcid VT qhs  HEMATOLOGIC  Recent Labs  08/19/15 0525 08/20/15 0548  HGB 12.2* 12.3*   Wbc 11.2->13.1 A:   Anemia - Mild. Leukocytosis - Mild.  P:  Trending cell counts daily w/ CBC SCDs for prophylaxis No chemical DVT ppx given ICH  INFECTIOUS A:   FUO - Likely secondary to ICH.   P:   Checking Blood, Urine, & Tracheal Aspirate Cultures Checking UA PCT <0.10 Sputum SA pan sen Empiric /Z 6/3>> 6/4 dc vanc  ENDOCRINE CBG (last 3)   Recent Labs  08/20/15 0020 08/20/15 0434 08/20/15 0807  GLUCAP 134* 115* 122*     A:   No acute issues.    P:   Monitor blood sugar on daily labs.  FAMILY UPDATES:  No family at bedside 6/3.  TODAY'S SUMMARY:  30 yo male  with headache, slurred speech and Lt side weakness from Rt basal ganglia ICH. Continuing on treatment per neurology protocol. 3% hypertonic saline likely contributing to some acute on chronic diastolic congestive heart failure. Checking tracheal aspirate, blood, and urine cultures given fever although I suspect this is secondary to patient's ICH. Pink tinged secretions are quite probably secondary to decompensated digestive heart failure as well. Holding on diuresis until patient is able to transition off 3% hypertonic saline. He remains sedated, fio2 at 40% but no weaning. He will most likely need a tracheostomy due to body habitus. 6/3 with fever 103.0 and 102.6 and wbc rising. Started empiric abx with V/Z, sputum with SA pan sensitive. May be extubateable but will wait 24 more hours to extubate/   [facility] Minor ACNP [facility] Pager [phone] till 3 pm If no answer page [phone] 08/20/2015, 9:05 AM

## 2015-08-20 NOTE — Procedures (Signed)
Pt transported from 3M02 to CT then back without complications.

## 2015-08-20 NOTE — Progress Notes (Signed)
STROKE TEAM PROGRESS NOTE   SUBJECTIVE (INTERVAL HISTORY) Patient remains sedated and intubated. Neurologically stable. Blood pressure has been adequately controlled. He remains restless when sedation is off. He is on hypertonic saline and serum sodium this morning is 152 . Follow up CT head shows stable hemorrhage , edema and midline shift.OBJECTIVE Temp:  [98.9 F (37.2 C)-100.7 F (38.2 C)] 99.8 F (37.7 C) (06/04 1200) Pulse Rate:  [100-128] 109 (06/04 1000) Cardiac Rhythm:  [-] Sinus tachycardia (06/04 0800) Resp:  [16-27] 22 (06/04 1000) BP: (120-183)/(74-110) 168/94 mmHg (06/04 1000) SpO2:  [94 %-100 %] 97 % (06/04 1000) FiO2 (%):  [40 %] 40 % (06/04 1000) Weight:  [292 lb 5.3 oz (132.6 kg)] 292 lb 5.3 oz (132.6 kg) (06/04 0444)  CBC:   Recent Labs Lab 08/15/15 2245  08/19/15 0525 08/20/15 0548  WBC 6.7  < > 13.1* 10.2  NEUTROABS 3.4  --   --   --   HGB 14.0  < > 12.2* 12.3*  HCT 40.8  < > 40.2 39.8  MCV 92.9  < > 101.8* 99.7  PLT 217  < > 201 211  < > = values in this interval not displayed.  Basic Metabolic Panel:  Recent Labs Lab 08/19/15 0525  08/20/15 0110 08/20/15 0548  NA 153*  < > 149*  150* 152*  151*  K 3.6  < > 3.6 4.1  CL 119*  < > 115* 116*  CO2 26  < > 27 27  GLUCOSE 127*  < > 137* 117*  BUN 18  < > 20 22*  CREATININE 1.08  < > 1.02 1.06  CALCIUM 9.0  < > 9.0 8.7*  MG 1.7  --   --  1.9  PHOS 3.6  --   --  4.7*  < > = values in this interval not displayed.  Lipid Panel:     Component Value Date/Time   TRIG 316* 08/20/2015 0547   HgbA1c: No results found for: HGBA1C Urine Drug Screen:     Component Value Date/Time   LABOPIA NONE DETECTED 08/16/2015 0033   COCAINSCRNUR NONE DETECTED 08/16/2015 0033   LABBENZ NONE DETECTED 08/16/2015 0033   AMPHETMU NONE DETECTED 08/16/2015 0033   THCU NONE DETECTED 08/16/2015 0033   LABBARB NONE DETECTED 08/16/2015 0033     IMAGING  Ct Head Wo Contrast  08/20/2015  ADDENDUM REPORT: 08/20/2015  07:36 ADDENDUM: There is herniation of the cerebellar tonsils through the foramen magnum. This is stable when compared to previous studies and likely reflects Chiari malformation although herniation from mass-effect cannot be completely excluded. Electronically Signed   By: Rolm Baptise M.D.   On: 08/20/2015 07:36  08/20/2015  CLINICAL DATA:  Follow-up intra cerebral hemorrhage EXAM: CT HEAD WITHOUT CONTRAST TECHNIQUE: Contiguous axial images were obtained from the base of the skull through the vertex without intravenous contrast. COMPARISON:  None. FINDINGS: Again noted is the acute hemorrhage with in the right basal ganglia measuring 5.4 by 1.9 cm, not significantly changed. Mild surrounding vasogenic edema. There is mass-effect on the right lateral ventricle with slight midline shift of 2 mm. No hydrocephalus. No new hemorrhage. Mucosal thickening in the paranasal sinuses. Mastoid air cells are clear. IMPRESSION: Stable right basal ganglia hemorrhage with mass-effect and slight midline shift. Electronically Signed: By: Rolm Baptise M.D. On: 08/20/2015 07:16   Dg Chest Port 1 View  08/20/2015  CLINICAL DATA:  Respiratory failure EXAM: PORTABLE CHEST 1 VIEW COMPARISON:  08/17/2015 FINDINGS: Support devices  are stable. There is cardiomegaly with vascular congestion and probable interstitial edema. No real change since prior study. No visible effusions or acute bony abnormality. IMPRESSION: Mild interstitial edema/CHF. Electronically Signed   By: Rolm Baptise M.D.   On: 08/20/2015 07:31   2D Echocardiogram  Left ventricle: The cavity size was normal. There was severe concentric hypertrophy. Systolic function was normal. The estimated ejection fraction was in the range of 50% to 55%. Wall motion was normal; there were no regional wall motion abnormalities. Features are consistent with a pseudonormal left ventricular filling pattern, with concomitant abnormal relaxation and increased filling pressure (grade 2  diastolic dysfunction). Doppler parameters are consistent with high ventricular filling pressure.   PHYSICAL EXAM GENERAL- Intubated, sedated, appears as stated age, not in any distress, Obese. HEENT- Atraumatic, normocephalic, Pupils constricted, reactive and equal, disconjugate, EOMI CARDIAC- RRR, no murmurs, rubs or gallops. RESP- Moving equal volumes of air, and clear to auscultation bilaterally, anteriorly ABDOMEN- Soft, obese EXTREMITIES- pulse 2+, symmetric, no pedal edema. SKIN- Warm, dry, No rash or lesion. NEUROLOGICAL EXAM : intubated and sedated- but able to follow commands on the right side. eyes closed, Pupils 2 mm reactive. Fundi not visualized.Dolls eye present, slightly dysconjugate gaze with mild exotropia left eye.Corneal reflex intact- but more brisk on the right, no obvious facial assym, moving RUE, RLE, LLE to pain, withdraws to pain, some purposeful movement. Minimal LUE movement to pain   ASSESSMENT/PLAN [name] is a 30 y.o. male with history of hypertension presenting with L sided weakness. CT showed a L basal ganglia hemorrhage, with change in mental status, pt had repeat Ct head which showed increase size of hrr, and increased mass effect, pt and was subsequently intubated.  Stroke:  Non-dominant right basal ganglia hemorrhage secondary to hypertension with resultant neuro worsening, hemorrhage extension and cerebral edema  Resultant  L hemiparesis, VDRF, HA, dysphagia  Initial CT R BG hemorrhage 3.7x1.9x2, mild edema & mass effect  Repeat CT with neuro worsening increased size of R BG hmg w/ increased mass effect. 72m R shift, supratentorial sulcal effacement, concern for cerebral edema.   Repeat CT same this afternoon  Stable hematoma. Unchanged shift or edema. Vol 15.4cc  CTA head & neck ordered for tomorrow  2D Echo  EF 50-55%. No source of embolus   SCDs for VTE prophylaxis  Diet NPO time specified, consider starting tube feeds.  No  antithrombotic prior to admission  Ongoing aggressive stroke risk factor management  Therapy recommendations:  pending   Disposition:  pending   Acute respiratory failure  Secondary to hemorrhage  Intubated in the ED  CCM managing vent, On propofol and fentanyl  Cardiomegaly and abnormal EKG  Seen on chest xray, most likely related to HTN heart dx  Echo severe concentric hypertrophy. EF 50-55%. Grade II diastolic HF  Flat elevated troponin, t wave inversions in inferior lat leads.  Might need cardiac eval at some point.   Induced Hypernatremia  To prevent cerebral edema  CL placed and started on 3% NA drip   Na 136 >152 this am .  Na goal - 150- 155.  At risk for pulmonary edema  Dysphagia  secondary to stroke  On tube feeds per PCCM.  Hypertensive Emergency  BP 182/124 on arrival in setting of acute neurologic symptoms  BP < 160 since 0400  On cardene  SBP goal < 160   Other Stroke Risk Factors  Morbid Obesity, Body mass index is 45.77 kg/(m^2).  Other [facility] day # 4   I have personally examined this patient, reviewed notes, independently viewed imaging studies, participated in medical decision making and plan of care. I have made any additions or clarifications directly to the above note. Continue strict blood pressure control and sedation. Hypertonic saline with sodium goal 150-155. Plan Continue hypertonic saline drip at 50cc/hr. Aggressive temp control and maintain normothermia.  I had a long discussion at the bedside with patient's mother and dad regarding his condition, plan of care and answered questions. Dr Vaughan Browner to decide on extubation   after 2-3 days versus tracheostomy. Plan change blood pressure goal to a systolic below 030 and taper Cardene drip and increase oral medications through NG tube. DC SCDs and add Lovenox for DVT prophylaxis. Discussed with Richardson Landry Minor critical care physician Asst. This patient is  critically ill and at significant risk of neurological worsening, death and care requires constant monitoring of vital signs, hemodynamics,respiratory and cardiac monitoring, extensive review of multiple databases, frequent neurological assessment, discussion with family, other specialists and medical decision making of high complexity.I have made any additions or clarifications directly to the above note.This critical care time does not reflect procedure time, or teaching time or supervisory time of PA/NP/Med Resident etc but could involve care discussion time.  I spent 35 minutes of neurocritical care time  in the care of  this patient.      Antony Contras, MD Medical Director [facility] Pager: [phone] 08/20/2015 12:19 PM

## 2015-08-21 ENCOUNTER — Inpatient Hospital Stay (HOSPITAL_COMMUNITY): Payer: 59

## 2015-08-21 DIAGNOSIS — I609 Nontraumatic subarachnoid hemorrhage, unspecified: Secondary | ICD-10-CM

## 2015-08-21 DIAGNOSIS — I161 Hypertensive emergency: Secondary | ICD-10-CM

## 2015-08-21 DIAGNOSIS — J9600 Acute respiratory failure, unspecified whether with hypoxia or hypercapnia: Secondary | ICD-10-CM

## 2015-08-21 LAB — SODIUM
Sodium: 150 mmol/L — ABNORMAL HIGH (ref 135–145)
Sodium: 150 mmol/L — ABNORMAL HIGH (ref 135–145)
Sodium: 152 mmol/L — ABNORMAL HIGH (ref 135–145)

## 2015-08-21 LAB — CBC
HCT: 42.5 % (ref 39.0–52.0)
Hemoglobin: 13.1 g/dL (ref 13.0–17.0)
MCH: 31 pg (ref 26.0–34.0)
MCHC: 30.8 g/dL (ref 30.0–36.0)
MCV: 100.5 fL — ABNORMAL HIGH (ref 78.0–100.0)
Platelets: 220 10*3/uL (ref 150–400)
RBC: 4.23 MIL/uL (ref 4.22–5.81)
RDW: 13.6 % (ref 11.5–15.5)
WBC: 10.8 10*3/uL — ABNORMAL HIGH (ref 4.0–10.5)

## 2015-08-21 LAB — GLUCOSE, CAPILLARY
Glucose-Capillary: 124 mg/dL — ABNORMAL HIGH (ref 65–99)
Glucose-Capillary: 133 mg/dL — ABNORMAL HIGH (ref 65–99)
Glucose-Capillary: 122 mg/dL — ABNORMAL HIGH (ref 65–99)
Glucose-Capillary: 132 mg/dL — ABNORMAL HIGH (ref 65–99)
Glucose-Capillary: 130 mg/dL — ABNORMAL HIGH (ref 65–99)

## 2015-08-21 LAB — RENAL FUNCTION PANEL
Albumin: 2.9 g/dL — ABNORMAL LOW (ref 3.5–5.0)
Anion gap: 7 (ref 5–15)
BUN: 22 mg/dL — ABNORMAL HIGH (ref 6–20)
CO2: 27 mmol/L (ref 22–32)
Calcium: 8.5 mg/dL — ABNORMAL LOW (ref 8.9–10.3)
Chloride: 117 mmol/L — ABNORMAL HIGH (ref 101–111)
Creatinine, Ser: 0.92 mg/dL (ref 0.61–1.24)
GFR calc Af Amer: >60 mL/min (ref >60)
GFR calc non Af Amer: >60 mL/min (ref >60)
Glucose, Bld: 133 mg/dL — ABNORMAL HIGH (ref 65–99)
Phosphorus: 3.9 mg/dL (ref 2.5–4.6)
Potassium: 4 mmol/L (ref 3.5–5.1)
Sodium: 151 mmol/L — ABNORMAL HIGH (ref 135–145)

## 2015-08-21 LAB — TRIGLYCERIDES: Triglycerides: 361 mg/dL — ABNORMAL HIGH (ref <150)

## 2015-08-21 LAB — MAGNESIUM: Magnesium: 2.4 mg/dL (ref 1.7–2.4)

## 2015-08-21 MED ORDER — METOPROLOL TARTRATE 25 MG/10 ML ORAL SUSPENSION
50.0000 mg | Freq: Two times a day (BID) | ORAL | Status: DC
  Administered 2015-08-21 – 2015-08-24 (×6): 50 mg
  Filled 2015-08-21 (×6): qty 20

## 2015-08-21 MED ORDER — VITAL HIGH PROTEIN PO LIQD
1000.0000 mL | ORAL | Status: DC
  Administered 2015-08-21: 1000 mL
  Administered 2015-08-22
  Administered 2015-08-22: 1000 mL
  Administered 2015-08-22: 21:00:00
  Administered 2015-08-23: 1000 mL
  Administered 2015-08-23 (×2)

## 2015-08-21 MED ORDER — PRO-STAT SUGAR FREE PO LIQD
60.0000 mL | Freq: Four times a day (QID) | ORAL | Status: DC
  Administered 2015-08-21 – 2015-08-24 (×10): 60 mL
  Filled 2015-08-21 (×8): qty 60

## 2015-08-21 MED ORDER — CHLORHEXIDINE GLUCONATE 0.12 % MT SOLN
OROMUCOSAL | Status: AC
  Filled 2015-08-21: qty 15

## 2015-08-21 MED ORDER — METOPROLOL TARTRATE 25 MG/10 ML ORAL SUSPENSION
25.0000 mg | Freq: Two times a day (BID) | ORAL | Status: DC
  Administered 2015-08-21: 25 mg
  Filled 2015-08-21: qty 10

## 2015-08-21 MED ORDER — LISINOPRIL 20 MG PO TABS
20.0000 mg | ORAL_TABLET | Freq: Two times a day (BID) | ORAL | Status: DC
  Administered 2015-08-21 – 2015-08-24 (×7): 20 mg via ORAL
  Filled 2015-08-21 (×7): qty 1

## 2015-08-21 NOTE — Progress Notes (Signed)
OT Cancellation Note  Patient Details Name: [name] M Gebbia MRN: [id number] DOB: 06/05/1985   Cancelled Treatment:    Reason Eval/Treat Not Completed: Patient not medically ready. (spoke with nsg, remains inappropriate for therapies at this time)  Evette GeorgesLeonard, Laszlo Ellerby Eva [phone] 08/21/2015, 7:52 AM

## 2015-08-21 NOTE — Progress Notes (Signed)
STROKE TEAM PROGRESS NOTE   SUBJECTIVE (INTERVAL HISTORY) Patient brother is at bedside. He is still on propofol but able to follow some simple commands.  Still on 3% saline and cardene. Na 151. Repeat CT yesterday showed stable hematoma with minimal midline shift.   OBJECTIVE Temp:  [99.8 F (37.7 C)-101.3 F (38.5 C)] 100.2 F (37.9 C) (06/05 0800) Pulse Rate:  [101-127] 123 (06/05 0800) Cardiac Rhythm:  [-] Sinus tachycardia (06/05 0800) Resp:  [16-29] 21 (06/05 0800) BP: (125-183)/(73-116) 166/104 mmHg (06/05 0800) SpO2:  [95 %-100 %] 98 % (06/05 0800) FiO2 (%):  [40 %] 40 % (06/05 0800) Weight:  [133.6 kg (294 lb 8.6 oz)] 133.6 kg (294 lb 8.6 oz) (06/05 0500)  CBC:   Recent Labs Lab 08/15/15 2245  08/20/15 0548 08/21/15 0555  WBC 6.7  < > 10.2 10.8*  NEUTROABS 3.4  --   --   --   HGB 14.0  < > 12.3* 13.1  HCT 40.8  < > 39.8 42.5  MCV 92.9  < > 99.7 100.5*  PLT 217  < > 211 220  < > = values in this interval not displayed.  Basic Metabolic Panel:  Recent Labs Lab 08/20/15 0548  08/21/15 0019 08/21/15 0555  NA 152*  151*  < > 152* 151*  K 4.1  --   --  4.0  CL 116*  --   --  117*  CO2 27  --   --  27  GLUCOSE 117*  --   --  133*  BUN 22*  --   --  22*  CREATININE 1.06  --   --  0.92  CALCIUM 8.7*  --   --  8.5*  MG 1.9  --   --  2.4  PHOS 4.7*  --   --  3.9  < > = values in this interval not displayed.  Lipid Panel:     Component Value Date/Time   TRIG 361* 08/21/2015 0555   HgbA1c: No results found for: HGBA1C Urine Drug Screen:     Component Value Date/Time   LABOPIA NONE DETECTED 08/16/2015 0033   COCAINSCRNUR NONE DETECTED 08/16/2015 0033   LABBENZ NONE DETECTED 08/16/2015 0033   AMPHETMU NONE DETECTED 08/16/2015 0033   THCU NONE DETECTED 08/16/2015 0033   LABBARB NONE DETECTED 08/16/2015 0033     IMAGING I have personally reviewed the radiological images below and agree with the radiology interpretations.  Ct Head Wo Contrast 08/20/2015   ADDENDUM: There is herniation of the cerebellar tonsils through the foramen magnum. This is stable when compared to previous studies and likely reflects Chiari malformation although herniation from mass-effect cannot be completely excluded. 08/20/2015  Stable right basal ganglia hemorrhage with mass-effect and slight midline shift.  08/16/2015   No significant change since the previous study. Right basal ganglia hematoma volume 15.4 cc. Slightly better defined surrounding edema.  08/16/2015   Increased size of acute right basal ganglia hemorrhage, with increased mass effect. There is now 2 mm right to left midline shift. There is developing supratentorial sulcal effacement and persistent basilar cistern effacement concerning for developing cerebral edema.  08/15/2015  Acute right basal ganglia hemorrhage measuring 3.7 x 1.9 x 2.0 cm. Mild surrounding edema and mass effect, no midline shift. There is crowding of the basilar cisterns.   Ct Angio Head & Neck W/cm &/or Wo/cm 08/18/2015  1. Negative CTA of the head and neck. Please note that evaluation is somewhat limited by body habitus  and timing of the contrast bolus. 2. Slight interval increase in size of acute right basal ganglia parenchymal hemorrhage, now measuring approximately 16 cc in estimated volume, previously 15.4 cc. Slightly increased and more defined surrounding vasogenic edema. Similar 2 mm right-to-left shift. Supratentorial sulcal effacement concerning for cerebral edema, similar to prior.   Dg Chest Port 1 View 08/21/2015   Cardiomegaly with vascular congestion. Improving interstitial edema.  08/20/2015  Mild interstitial edema/CHF.  08/17/2015   1. Lines and tubes in stable position . 2. Cardiomegaly with increased interstitial markings consistent with congestive heart failure. Small left pleural effusion cannot be excluded.  08/16/2015  1. Tip of the right central line in the mid SVC.  No pneumothorax. 2. Endotracheal and enteric tubes remain in  place. 3. Unchanged enlargement of the cardiac silhouette. Decreased vascular congestion and edema from prior.  08/16/2015   1. Endotracheal tube seen ending 2-3 cm above the carina. 2. Lungs mildly hypoexpanded. Vascular congestion noted. Increased interstitial markings raise concern for mild pulmonary edema, though pneumonia could have a similar appearance.   Dg Abd Portable 1v 08/16/2015  No acute findings per Enteric tube with tip in the right upper quadrant likely over the distal stomach or proximal duodenum.   2D Echocardiogram  Left ventricle: The cavity size was normal. There was severe concentric hypertrophy. Systolic function was normal. The estimated ejection fraction was in the range of 50% to 55%. Wall motion was normal; there were no regional wall motion abnormalities. Features are consistent with a pseudonormal left ventricular filling pattern, with concomitant abnormal relaxation and increased filling pressure (grade 2 diastolic dysfunction). Doppler parameters are consistent with high ventricular filling pressure.   PHYSICAL EXAM GENERAL- Intubated, sedated, appears as stated age, not in acute distress, Obese. HEENT- Atraumatic, normocephalic, Pupils reactive and equal CARDIAC- regular, tachycardia, no murmurs, rubs or gallops. NEUROLOGICAL EXAM : intubated and on sedation, but able to follow simple commands on the right side. eyes open on commands, Pupils 2 mm reactive. Fundi not visualized. Dolls eye present, slightly dysconjugate gaze with mild exotropia left eye. Corneal reflex intact but more brisk on the right, no obvious facial asymetry due to ET tube, LLE 2+/5 to pain and LUE 3/5 spontaneous movement, RUE and RLE trace withdraws to pain. Sensation, coordination and gait not tested.   ASSESSMENT/PLAN Brad Mcgee is a 30 y.o. male with history of hypertension presenting with L sided weakness. CT showed a L basal ganglia hemorrhage, with change in mental status, pt had  repeat Ct head which showed mildly increased size of hmg, and increased mass effect. Pt and was subsequently intubated.  Stroke:  Right BG ICH hemorrhage secondary to hypertension   Resultant  L hemiparesis, VDRF  Initial CT R BG hemorrhage 3.7x1.9x2, mild edema & mass effect  Repeat CT with neuro worsening increased size of R BG hmg w/ increased mass effect and cerebral edema.   Repeat CT same this afternoon  Stable hematoma. Unchanged shift or edema.  CTA head & neck unremarkable  2D Echo  EF 50-55%. No source of embolus   SCDs and lovenox for VTE prophylaxis  Diet NPO time specified, on tube feeds.  No antithrombotic prior to admission, not on antithrombotics due to ICH  Ongoing aggressive stroke risk factor management  Therapy recommendations:  pending   Disposition:  pending   Acute respiratory failure  Secondary to hemorrhage  Intubated in the ED  CCM managing vent, On propofol and fentanyl  Wean propofol and  fentanyl as able  MSSA in sputum. abx narrowed from zosyn to ancef  Cardiomegaly and abnormal EKG  Seen on chest xray, most likely related to HTN heart dx  Echo severe concentric hypertrophy. EF 50-55%. Grade II diastolic HF  Flat elevated troponin, t wave inversions in inferior lat leads.  Might need cardiac eval at some point.   Cerebral edema  on 3% saline   Na goal - 150- 55.  Na 136 ->151 this am  Repeat CT stable hematoma and midline shift  Taper off 3% saline in am  Hypertensive Emergency  BP 182/124 on arrival in setting of acute neurologic symptoms  BP < 160 since 0400  On max dose of cardene. Wean as able  Increase prinvil 10 to 20 bid  Add metoprolol 50 bid  Continue triamterene-HCTZ 37.2/25 daily  SBP goal < 160  Other Stroke Risk Factors  Morbid Obesity, Body mass index is 46.12 kg/(m^2).   Acute on chronic diastolic CHF, grade 2 chronic CHF  High risk OSA. OP eval recommended  Other Active  Problems  Anemia, mild  Leukocytosis, mild  Hospital day # 5   This patient is critically ill due to ICH, respiratory failure, hypertensive emergency and at significant risk of neurological worsening, death form hematoma expansion, heart failure, cerebral edema and brain herniation. This patient's care requires constant monitoring of vital signs, hemodynamics, respiratory and cardiac monitoring, review of multiple databases, neurological assessment, discussion with family, other specialists and medical decision making of high complexity. I spent 40 minutes of neurocritical care time in the care of this patient.  Marvel PlanJindong Anel Creighton, MD PhD Stroke Neurology 08/21/2015 6:24 PM

## 2015-08-21 NOTE — Progress Notes (Signed)
PULMONARY / CRITICAL CARE MEDICINE   Name: Brad Mcgee MRN: [id number] DOB: 08/25/1985    ADMISSION DATE:  08/15/2015 CONSULTATION DATE:  08/16/2015  REFERRING MD:  Dr. Amada JupiterKirkpatrick  CHIEF COMPLAINT:  Left side weakness  HISTORY OF PRESENT ILLNESS:  Hx from chart. 30 yo male with slurred speech and Lt sided weakness.  This was associated with headache.  He was intubated for airway protection.  He had CT head which showed 3.7 x 1.9 x 2 cm Rt basal ganglia ICH which then increased to 4.5 x 1.9 x 3.4 cm ICH on f/u CT head.  His BP was 187/106.  SUBJECTIVE:  Tolerated PSv all day 6/4 Sedated at this time but on PS5 Na at goal Nicardipine running    VITAL SIGNS: BP 163/101 mmHg  Pulse 119  Temp(Src) 100.2 F (37.9 C) (Rectal)  Resp 22  Ht 5\' 7"  (1.702 m)  Wt 133.6 kg (294 lb 8.6 oz)  BMI 46.12 kg/m2  SpO2 97%  HEMODYNAMICS:    VENTILATOR SETTINGS: Vent Mode:  [-] CPAP;PSV FiO2 (%):  [40 %] 40 % Set Rate:  [16 bmp] 16 bmp Vt Set:  [600 mL] 600 mL PEEP:  [5 cmH20] 5 cmH20 Pressure Support:  [5 cmH20-8 cmH20] 5 cmH20 Plateau Pressure:  [14 cmH20] 14 cmH20  INTAKE / OUTPUT: I/O last 3 completed shifts: In: 4994.9 [I.V.:4034.9; NG/GT:360; IV Piggyback:600] Out: 5840 [Urine:5840]  PHYSICAL EXAMINATION: General:  Obese. No distress. No family at bedside. Sedated Neuro:  Pupils symmetric. No spontaneous movements. Opens eyes with stimulation. Follows commands when not sedated HEENT:  ETT in place. No scleral icterus or injection. Cardiovascular:  Regular rate & rhythm. No appreciable JVD given body habitus. Lungs:  Distant breath sounds. Symmetric chest rise on vent. Diminshed in bases Abdomen:  Soft. Protuberant. Normal bowel sounds. Integument:  Warm & dry. No rash on exposed skin.  LABS:  BMET  Recent Labs Lab 08/20/15 0110 08/20/15 0548  08/20/15 1853 08/21/15 0019 08/21/15 0555  NA 149*  150* 152*  151*  < > 152* 152* 151*  K 3.6 4.1  --   --   --  4.0   CL 115* 116*  --   --   --  117*  CO2 27 27  --   --   --  27  BUN 20 22*  --   --   --  22*  CREATININE 1.02 1.06  --   --   --  0.92  GLUCOSE 137* 117*  --   --   --  133*  < > = values in this interval not displayed.  Electrolytes  Recent Labs Lab 08/19/15 0525  08/20/15 0110 08/20/15 0548 08/21/15 0555  CALCIUM 9.0  < > 9.0 8.7* 8.5*  MG 1.7  --   --  1.9 2.4  PHOS 3.6  --   --  4.7* 3.9  < > = values in this interval not displayed.  CBC  Recent Labs Lab 08/19/15 0525 08/20/15 0548 08/21/15 0555  WBC 13.1* 10.2 10.8*  HGB 12.2* 12.3* 13.1  HCT 40.2 39.8 42.5  PLT 201 211 220    Coag's  Recent Labs Lab 08/15/15 2245  APTT 32  INR 0.98    Sepsis Markers  Recent Labs Lab 08/18/15 1432 08/19/15 0525 08/20/15 0548  PROCALCITON 0.10 <0.10 0.24    ABG  Recent Labs Lab 08/16/15 0434  PHART 7.333*  PCO2ART 45.2*  PO2ART 73.9*    Liver Enzymes  Recent Labs Lab 08/15/15 2245  08/19/15 0525 08/20/15 0548 08/21/15 0555  AST 23  --   --   --   --   ALT 20  --   --   --   --   ALKPHOS 58  --   --   --   --   BILITOT 0.4  --   --   --   --   ALBUMIN 4.3  < > 2.9* 2.6* 2.9*  < > = values in this interval not displayed.  Cardiac Enzymes  Recent Labs Lab 08/15/15 2245 08/16/15 0435 08/16/15 1030  TROPONINI 0.05* 0.06* 0.06*    Glucose  Recent Labs Lab 08/20/15 1149 08/20/15 1539 08/20/15 2059 08/20/15 2358 08/21/15 0351 08/21/15 0911  GLUCAP 118* 87 120* 121* 124* 133*    Imaging Dg Chest Port 1 View  08/21/2015  CLINICAL DATA:  Respiratory failure EXAM: PORTABLE CHEST 1 VIEW COMPARISON:  08/20/2015 FINDINGS: Support devices are stable. Cardiomegaly with vascular congestion. Improving interstitial edema pattern. No confluent opacities or effusions. IMPRESSION: Cardiomegaly with vascular congestion. Improving interstitial edema. Electronically Signed   By: Charlett Nose M.D.   On: 08/21/2015 07:27    STUDIES:  5/30 CT Head: 3.7  x 1.9 x 2 cm Rt basal ganglia ICH 5/31 CT Head:  No significant change. 5.2x1.9x3.0cm right basal ganglia hematoma. Mild mass effect. 5/31 TTE: Severe concentric LVH with EF 50-55%. No regional wall motion abnormalities. Grade 2 diastolic dysfunction. LA & RA normal in size. RV normal in size and function. No aortic stenosis or regurgitation. Aortic root normal in size. Trivial mitral regurgitation without stenosis. No pulmonic regurgitation or stenosis. No tricuspid regurgitation. No pericardial effusion. 6/01 Port CXR:  Increased bilateral interstitial markings. Unable to appreciate pleural effusion. ETT & CVL in stable position.  MICROBIOLOGY: MRSA PCR 5/31:  Negative  Sputum 6/2 GPC>>MSSA Blood 6/2>>  ANTIBIOTICS: 6/3 vanc>>6/4 6/3 zoysn>> 6/4 6/4 cefazolin >>    SIGNIFICANT EVENTS: 5/30 Admit 5/31 Start 3% NS>>  LINES/TUBES: OETT 7.5 5/31 >> R IJ CVL 5/31 >> OGT 5/31 >> FOLEY 5/31 >> PIV x3  ASSESSMENT / PLAN:  NEUROLOGIC A:   R Basal Ganglia ICH / CVA Sedation on Ventilator  P:   Management per Neurology RASS goal: -2 Propofol gtt, would like to lighten next 24h Versed IV prn Fentanyl gtt & IV prn 3% HS Saline IV per Neurology  PULMONARY A: Compromised Airway - Secondary to ICH. VDRF Acute on chronic Diastolic CHF & 3% NaCl High risk for OSA  P:   Full vent support Daily SBT & WUA Holding on Lasix Diuresis until off 3% HS, note net negative Assess for extubation, mental status next 24h  CARDIOVASCULAR A:  Hypertensive Emergency Acute on Chronic Diastolic CHF - Likely secondary to 3% HS. Elevated Troponin I - Likely demand ischemia. No wall motion abnormalities on TTE. Grade 2 Diastolic CHF   P:  Vitals per Unit Protocol Monitoring on telemetry Cardene to keep SBP < 160, nicardipine gtt running Labetalol IV prn Lisinopril 20mg  QD, metoprolol 25 bid, Maxide-37.5 / 25 QD Holding on Lasix while on 3% saline  RENAL  Recent Labs Lab  08/20/15 1853 08/21/15 0019 08/21/15 0555  NA 152* 152* 151*    A:   Hypernatremia - Medically induced. Hyperchloremia - Medically induced.  P:   Goal Na 150 - 155 per Neuro Trending electrolytes & renal function daily Monitoring Na q6hr Following UOP with Foley  GASTROINTESTINAL A:   Nutrition.  P:   Continuing Tube feeds Pepcid VT qhs  HEMATOLOGIC  Recent Labs  08/20/15 0548 08/21/15 0555  HGB 12.3* 13.1   Wbc 11.2->13.1 A:   Anemia - Mild. Leukocytosis - Mild.  P:  Trending cell counts w/ CBC SCDs for prophylaxis No chemical DVT ppx given ICH  INFECTIOUS A:   FUO - possibly secondary to ICH.  MSSA bronchitis vs PNA P:   Follow Blood, Urine, & Tracheal Aspirate Cultures PCT 0.10 > 0.24 empiric abx narrowed to cefazolin on 6/4  ENDOCRINE CBG (last 3)   Recent Labs  08/20/15 2358 08/21/15 0351 08/21/15 0911  GLUCAP 121* 124* 133*   A:   No acute issues.    P:   Monitor blood sugar on daily labs.  FAMILY UPDATES:  No family at bedside 6/5.  TODAY'S SUMMARY:  30 yo male with headache, slurred speech and Lt side weakness from Rt basal ganglia ICH and diastolic CHF exacerbation. On cefazolin for MSSA in sputum. Assess MS and resp status for possible extubation next 24h. Note high risk for OSA which will likely affect our extubation plans.   Independent CC time 35 minutes   Levy Pupa, MD, PhD 08/21/2015, 11:18 AM [facility] [phone] or if no answer [phone]

## 2015-08-21 NOTE — Progress Notes (Signed)
Nutrition Follow-up  DOCUMENTATION CODES:   Morbid obesity  INTERVENTION:   Increase Vital High Protein to 25 ml/hr Decrease Prostat to 60 ml QID Provides: 1400 kcal, 172 grams protein, and 501 ml H2O.  TF regimen and propofol at current rate providing 1613 total kcal/day.   NUTRITION DIAGNOSIS:   Inadequate oral intake related to inability to eat as evidenced by NPO status. Ongoing.  GOAL:   Provide needs based on ASPEN/SCCM guidelines Progressing.   MONITOR:   Vent status, TF tolerance  ASSESSMENT:   30 yo male with slurred speech and Lt sided weakness. This was associated with headache. He was intubated for airway protection. He had CT head which showed 3.7 x 1.9 x 2 cm Rt basal ganglia ICH which then increased to 4.5 x 1.9 x 3.4 cm ICH on f/u CT head. His BP was 187/106.  Patient is currently intubated on ventilator support MV: 14.2 L/min Temp (24hrs), Avg:100.3 F (37.9 C), Min:99.8 F (37.7 C), Max:101.3 F (38.5 C)  Propofol: 8.1 ml/hr provides: 213 kcal per day from lipid Medications reviewed and include: MVI, senokot-s Labs reviewed: Na elevated (on3%), K+, Magnesium, PO4 WNL, TG 361 CBG's: 122-133 OG tube Spoke with RN, propofol decreased  Diet Order:  Diet NPO time specified  Skin:  Reviewed, no issues  Last BM:  6/5  Height:   Ht Readings from Last 1 Encounters:  08/15/15 5\' 7"  (1.702 m)    Weight:   Wt Readings from Last 1 Encounters:  08/21/15 294 lb 8.6 oz (133.6 kg)    Ideal Body Weight:  67.2 kg  BMI:  Body mass index is 46.12 kg/(m^2).  Estimated Nutritional Needs:   Kcal:  [id number]  Protein:  >/= 168 grams  Fluid:  > 1.5 L/day  EDUCATION NEEDS:   No education needs identified at this time  Kendell BaneHeather Marta Bouie RD, LDN, CNSC [phone] Pager [phone] After Hours Pager

## 2015-08-21 NOTE — Progress Notes (Signed)
PT Cancellation Note  Patient Details Name: Brad Mcgee MRN: [id number] DOB: 08/31/1985   Cancelled Treatment:    Reason Eval/Treat Not Completed: Patient not medically ready (spoke with nsg, remains inappropriate for therapies at this time)   Fabio AsaWerner, Ajit Errico J 08/21/2015, 7:35 AM Charlotte Crumbevon Johnnathan Hagemeister, PT DPT  [phone]

## 2015-08-22 LAB — RENAL FUNCTION PANEL
Albumin: 2.5 g/dL — ABNORMAL LOW (ref 3.5–5.0)
Anion gap: 6 (ref 5–15)
BUN: 33 mg/dL — ABNORMAL HIGH (ref 6–20)
CO2: 27 mmol/L (ref 22–32)
Calcium: 8.5 mg/dL — ABNORMAL LOW (ref 8.9–10.3)
Chloride: 119 mmol/L — ABNORMAL HIGH (ref 101–111)
Creatinine, Ser: 1 mg/dL (ref 0.61–1.24)
GFR calc Af Amer: >60 mL/min (ref >60)
GFR calc non Af Amer: >60 mL/min (ref >60)
Glucose, Bld: 135 mg/dL — ABNORMAL HIGH (ref 65–99)
Phosphorus: 3.4 mg/dL (ref 2.5–4.6)
Potassium: 3.7 mmol/L (ref 3.5–5.1)
Sodium: 152 mmol/L — ABNORMAL HIGH (ref 135–145)

## 2015-08-22 LAB — CBC
HCT: 39 % (ref 39.0–52.0)
Hemoglobin: 11.8 g/dL — ABNORMAL LOW (ref 13.0–17.0)
MCH: 30.6 pg (ref 26.0–34.0)
MCHC: 30.3 g/dL (ref 30.0–36.0)
MCV: 101 fL — ABNORMAL HIGH (ref 78.0–100.0)
Platelets: 221 10*3/uL (ref 150–400)
RBC: 3.86 MIL/uL — ABNORMAL LOW (ref 4.22–5.81)
RDW: 13.5 % (ref 11.5–15.5)
WBC: 10.6 10*3/uL — ABNORMAL HIGH (ref 4.0–10.5)

## 2015-08-22 LAB — SODIUM
Sodium: 153 mmol/L — ABNORMAL HIGH (ref 135–145)
Sodium: 153 mmol/L — ABNORMAL HIGH (ref 135–145)
Sodium: 155 mmol/L — ABNORMAL HIGH (ref 135–145)
Sodium: 155 mmol/L — ABNORMAL HIGH (ref 135–145)

## 2015-08-22 LAB — MAGNESIUM: Magnesium: 2.4 mg/dL (ref 1.7–2.4)

## 2015-08-22 LAB — GLUCOSE, CAPILLARY
Glucose-Capillary: 107 mg/dL — ABNORMAL HIGH (ref 65–99)
Glucose-Capillary: 112 mg/dL — ABNORMAL HIGH (ref 65–99)
Glucose-Capillary: 115 mg/dL — ABNORMAL HIGH (ref 65–99)
Glucose-Capillary: 117 mg/dL — ABNORMAL HIGH (ref 65–99)
Glucose-Capillary: 118 mg/dL — ABNORMAL HIGH (ref 65–99)
Glucose-Capillary: 121 mg/dL — ABNORMAL HIGH (ref 65–99)
Glucose-Capillary: 137 mg/dL — ABNORMAL HIGH (ref 65–99)

## 2015-08-22 LAB — TRIGLYCERIDES: Triglycerides: 331 mg/dL — ABNORMAL HIGH (ref <150)

## 2015-08-22 MED ORDER — FENTANYL CITRATE (PF) 100 MCG/2ML IJ SOLN
50.0000 ug | INTRAMUSCULAR | Status: DC | PRN
  Administered 2015-08-22 – 2015-08-23 (×3): 100 ug via INTRAVENOUS
  Filled 2015-08-22 (×4): qty 2

## 2015-08-22 NOTE — Progress Notes (Signed)
Pt remains sedated and on ventilator.  Planning possible extubation on 08/22/15 with tracheostomy if unable to extubate.  Cardene drip continues for elevated blood pressures.  Will continue to follow progress.  Hopeful for successful extubation tomorrow.    Quintella BatonJulie W. Meridian Scherger, RN, BSN  Trauma/Neuro ICU Case Manager [phone]

## 2015-08-22 NOTE — Progress Notes (Signed)
OT Cancellation Note  Patient Details Name: Brad Mcgee MRN: [id number] DOB: 12/10/1985   Cancelled Treatment:    Reason Eval/Treat Not Completed: Patient not medically ready (bedrest intubated) Please increase activity orders as appropriate.  Felecia ShellingJones, Margeaux Swantek B   Kolt Mcwhirter, Brynn   OTR/L Pager: [phone] Office: [phone] .  08/22/2015, 7:02 AM

## 2015-08-22 NOTE — Progress Notes (Signed)
STROKE TEAM PROGRESS NOTE   SUBJECTIVE (INTERVAL HISTORY) Pt Mom arrived during rounds. Patient still lethargic, but following some commands with sedation off. Family understand plans for extubation if able, and trach if unable. Still has left hemiplegia. Will wean off 3% saline.   OBJECTIVE Temp:  [98.1 F (36.7 C)-100 F (37.8 C)] 98.1 F (36.7 C) (06/06 0800) Pulse Rate:  [84-119] 92 (06/06 0900) Cardiac Rhythm:  [-] Normal sinus rhythm (06/06 0800) Resp:  [16-27] 19 (06/06 0900) BP: (107-178)/(60-101) 131/81 mmHg (06/06 0900) SpO2:  [97 %-100 %] 100 % (06/06 0900) FiO2 (%):  [40 %] 40 % (06/06 0900) Weight:  [133 kg (293 lb 3.4 oz)] 133 kg (293 lb 3.4 oz) (06/06 0500)  CBC:   Recent Labs Lab 08/15/15 2245  08/21/15 0555 08/22/15 0526  WBC 6.7  < > 10.8* 10.6*  NEUTROABS 3.4  --   --   --   HGB 14.0  < > 13.1 11.8*  HCT 40.8  < > 42.5 39.0  MCV 92.9  < > 100.5* 101.0*  PLT 217  < > 220 221  < > = values in this interval not displayed.  Basic Metabolic Panel:  Recent Labs Lab 08/21/15 0555  08/22/15 0500 08/22/15 0526  NA 151*  < > 152* 153*  K 4.0  --  3.7  --   CL 117*  --  119*  --   CO2 27  --  27  --   GLUCOSE 133*  --  135*  --   BUN 22*  --  33*  --   CREATININE 0.92  --  1.00  --   CALCIUM 8.5*  --  8.5*  --   MG 2.4  --   --  2.4  PHOS 3.9  --  3.4  --   < > = values in this interval not displayed.  Lipid Panel:     Component Value Date/Time   TRIG 331* 08/22/2015 0526   HgbA1c: No results found for: HGBA1C Urine Drug Screen:     Component Value Date/Time   LABOPIA NONE DETECTED 08/16/2015 0033   COCAINSCRNUR NONE DETECTED 08/16/2015 0033   LABBENZ NONE DETECTED 08/16/2015 0033   AMPHETMU NONE DETECTED 08/16/2015 0033   THCU NONE DETECTED 08/16/2015 0033   LABBARB NONE DETECTED 08/16/2015 0033     IMAGING I have personally reviewed the radiological images below and agree with the radiology interpretations.  Ct Head Wo  Contrast 08/20/2015  ADDENDUM: There is herniation of the cerebellar tonsils through the foramen magnum. This is stable when compared to previous studies and likely reflects Chiari malformation although herniation from mass-effect cannot be completely excluded. 08/20/2015  Stable right basal ganglia hemorrhage with mass-effect and slight midline shift.  08/16/2015   No significant change since the previous study. Right basal ganglia hematoma volume 15.4 cc. Slightly better defined surrounding edema.  08/16/2015   Increased size of acute right basal ganglia hemorrhage, with increased mass effect. There is now 2 mm right to left midline shift. There is developing supratentorial sulcal effacement and persistent basilar cistern effacement concerning for developing cerebral edema.  08/15/2015  Acute right basal ganglia hemorrhage measuring 3.7 x 1.9 x 2.0 cm. Mild surrounding edema and mass effect, no midline shift. There is crowding of the basilar cisterns.   Ct Angio Head & Neck W/cm &/or Wo/cm 08/18/2015  1. Negative CTA of the head and neck. Please note that evaluation is somewhat limited by body habitus and timing  of the contrast bolus. 2. Slight interval increase in size of acute right basal ganglia parenchymal hemorrhage, now measuring approximately 16 cc in estimated volume, previously 15.4 cc. Slightly increased and more defined surrounding vasogenic edema. Similar 2 mm right-to-left shift. Supratentorial sulcal effacement concerning for cerebral edema, similar to prior.   Dg Chest Port 1 View 08/22/2015 08/21/2015   Cardiomegaly with vascular congestion. Improving interstitial edema.  08/20/2015  Mild interstitial edema/CHF.  08/17/2015   1. Lines and tubes in stable position . 2. Cardiomegaly with increased interstitial markings consistent with congestive heart failure. Small left pleural effusion cannot be excluded.  08/16/2015  1. Tip of the right central line in the mid SVC.  No pneumothorax. 2. Endotracheal  and enteric tubes remain in place. 3. Unchanged enlargement of the cardiac silhouette. Decreased vascular congestion and edema from prior.  08/16/2015   1. Endotracheal tube seen ending 2-3 cm above the carina. 2. Lungs mildly hypoexpanded. Vascular congestion noted. Increased interstitial markings raise concern for mild pulmonary edema, though pneumonia could have a similar appearance.   Dg Abd Portable 1v 08/16/2015  No acute findings per Enteric tube with tip in the right upper quadrant likely over the distal stomach or proximal duodenum.   2D Echocardiogram  Left ventricle: The cavity size was normal. There was severe concentric hypertrophy. Systolic function was normal. The estimated ejection fraction was in the range of 50% to 55%. Wall motion was normal; there were no regional wall motion abnormalities. Features are consistent with a pseudonormal left ventricular filling pattern, with concomitant abnormal relaxation and increased filling pressure (grade 2 diastolic dysfunction). Doppler parameters are consistent with high ventricular filling pressure.   PHYSICAL EXAM GENERAL- Intubated, sedated, appears as stated age, not in acute distress, Obese. HEENT- Atraumatic, normocephalic, Pupils reactive and equal CARDIAC- regular rate and rhythm, no murmurs, rubs or gallops. NEUROLOGICAL EXAM : intubated and off sedation, able to follow simple commands on the right side. eyes open on commands, Pupils 2 mm reactive. Fundi not visualized. Dolls eye present, slightly dysconjugate gaze with mild exotropia left eye. Corneal reflex intact but more brisk on the right, no obvious facial asymetry due to ET tube, LLE 2+/5 to pain and LUE 3/5 spontaneous movement, RUE and RLE trace withdraws to pain. Sensation, coordination and gait not tested.   ASSESSMENT/PLAN Brad Mcgee is a 30 y.o. male with history of hypertension presenting with L sided weakness. CT showed a L basal ganglia hemorrhage, with  change in mental status, pt had repeat Ct head which showed mildly increased size of hmg, and increased mass effect. Pt and was subsequently intubated.  Stroke:  Right BG ICH hemorrhage secondary to hypertension   Resultant  L hemiparesis, VDRF  Initial CT R BG hemorrhage 3.7x1.9x2, mild edema & mass effect  Repeat CT with neuro worsening increased size of R BG hmg w/ increased mass effect and cerebral edema.   Repeat CT same this afternoon  Stable hematoma. Unchanged shift or edema.  CTA head & neck unremarkable  2D Echo  EF 50-55%. No source of embolus   SCDs and lovenox for VTE prophylaxis  Diet NPO time specified, on tube feeds.  No antithrombotic prior to admission, not on antithrombotics due to ICH  Ongoing aggressive stroke risk factor management  Therapy recommendations:  pending   Disposition:  pending   Acute respiratory failure MSSA bronchitis vs PNA  Secondary to hemorrhage  Intubated in the ED  CCM managing vent, Off propofol and fentanyl  this am  MSSA in sputum. abx narrowed from zosyn to ancef  Ok for wean from stroke standpoint  Cardiomegaly and abnormal EKG  Seen on chest xray, most likely related to HTN heart dx  Echo severe concentric hypertrophy. EF 50-55%. Grade II diastolic HF  Flat elevated troponin, t wave inversions in inferior lat leads.  Might need cardiac eval at some point.   Cerebral edema Induced hypernatremia Induced hyperchloremia  on 3% saline   Na goal - 150- 55.  Na 136 ->152 this am  Repeat CT stable hematoma and midline shift  Wean 3%. Order placed.  Hypertensive Emergency  BP 182/124 on arrival in setting of acute neurologic symptoms  BP < 160 since 0400  Off cardene at 2a this am  On prinvil 20, metoprolol 50 bid, triamterene-HCTZ 37.2/25   At SBP goal < 160  Other Stroke Risk Factors  Morbid Obesity, Body mass index is 45.91 kg/(m^2).   Acute on chronic diastolic CHF, grade 2 chronic CHF  High  risk OSA. OP eval recommended  Other Active Problems  Anemia, mild  Leukocytosis, mild  Fever of unknown origin, ? Related to [facility]  Hospital day # 6   Rhoderick Moody [facility] See Amion for Pager information 08/22/2015 9:32 AM   This patient is critically ill due to ICH, respiratory failure, hypertensive emergency and at significant risk of neurological worsening, death form hematoma expansion, heart failure, cerebral edema and brain herniation. This patient's care requires constant monitoring of vital signs, hemodynamics, respiratory and cardiac monitoring, review of multiple databases, neurological assessment, discussion with family, other specialists and medical decision making of high complexity. I spent 40 minutes of neurocritical care time in the care of this patient.  Pt neuro stable with some improvement of alertness. Off sedation. Will wean off 3% saline. BP under control, off cardene. OK from neuro standpoint to extubate. Discussed with Dr. Delton Coombes and also updated pt mom at bedside.  Marvel Plan, MD PhD Stroke Neurology 08/22/2015 6:38 PM

## 2015-08-22 NOTE — Progress Notes (Signed)
PULMONARY / CRITICAL CARE MEDICINE   Name: Brad Mcgee MRN: [id number] DOB: Feb 18, 1986    ADMISSION DATE:  08/15/2015 CONSULTATION DATE:  08/16/2015  REFERRING MD:  Dr. Amada Jupiter  CHIEF COMPLAINT:  Left side weakness  HISTORY OF PRESENT ILLNESS:  Hx from chart. 30 yo male with slurred speech and Lt sided weakness.  This was associated with headache.  He was intubated for airway protection.  He had CT head which showed 3.7 x 1.9 x 2 cm Rt basal ganglia ICH which then increased to 4.5 x 1.9 x 3.4 cm ICH on f/u CT head.  His BP was 187/106.  SUBJECTIVE:  Tolerating PSV 5 Na at goal Nicardipine running    VITAL SIGNS: BP 117/86 mmHg  Pulse 95  Temp(Src) 98.1 F (36.7 C) (Axillary)  Resp 24  Ht 5\' 7"  (1.702 m)  Wt 133 kg (293 lb 3.4 oz)  BMI 45.91 kg/m2  SpO2 100%  HEMODYNAMICS:    VENTILATOR SETTINGS: Vent Mode:  [-] PSV;CPAP FiO2 (%):  [40 %] 40 % Set Rate:  [16 bmp] 16 bmp Vt Set:  [600 mL] 600 mL PEEP:  [5 cmH20] 5 cmH20 Pressure Support:  [5 cmH20] 5 cmH20  INTAKE / OUTPUT: I/O last 3 completed shifts: In: 5506.3 [I.V.:4361.3; NG/GT:645; IV Piggyback:500] Out: 2440 [NUUVO:5366; Stool:100]  PHYSICAL EXAMINATION: General:  Obese. No distress. No family at bedside. Sedated Neuro:  Pupils symmetric. No spontaneous movements. Opens eyes with stimulation. Follows commands, weak gag HEENT:  ETT in place. No scleral icterus or injection. Cardiovascular:  Regular rate & rhythm. No appreciable JVD given body habitus. Lungs:  Distant breath sounds. Symmetric chest rise on vent. Diminshed in bases Abdomen:  Soft. Protuberant. Normal bowel sounds. Integument:  Warm & dry. No rash on exposed skin.  LABS:  BMET  Recent Labs Lab 08/20/15 0548  08/21/15 0555  08/22/15 0101 08/22/15 0500 08/22/15 0526  NA 152*  151*  < > 151*  < > 153* 152* 153*  K 4.1  --  4.0  --   --  3.7  --   CL 116*  --  117*  --   --  119*  --   CO2 27  --  27  --   --  27  --   BUN 22*   --  22*  --   --  33*  --   CREATININE 1.06  --  0.92  --   --  1.00  --   GLUCOSE 117*  --  133*  --   --  135*  --   < > = values in this interval not displayed.  Electrolytes  Recent Labs Lab 08/20/15 0548 08/21/15 0555 08/22/15 0500 08/22/15 0526  CALCIUM 8.7* 8.5* 8.5*  --   MG 1.9 2.4  --  2.4  PHOS 4.7* 3.9 3.4  --     CBC  Recent Labs Lab 08/20/15 0548 08/21/15 0555 08/22/15 0526  WBC 10.2 10.8* 10.6*  HGB 12.3* 13.1 11.8*  HCT 39.8 42.5 39.0  PLT 211 220 221    Coag's  Recent Labs Lab 08/15/15 2245  APTT 32  INR 0.98    Sepsis Markers  Recent Labs Lab 08/18/15 1432 08/19/15 0525 08/20/15 0548  PROCALCITON 0.10 <0.10 0.24    ABG  Recent Labs Lab 08/16/15 0434  PHART 7.333*  PCO2ART 45.2*  PO2ART 73.9*    Liver Enzymes  Recent Labs Lab 08/15/15 2245  08/20/15 0548 08/21/15 0555 08/22/15 0500  AST 23  --   --   --   --   ALT 20  --   --   --   --   ALKPHOS 58  --   --   --   --   BILITOT 0.4  --   --   --   --   ALBUMIN 4.3  < > 2.6* 2.9* 2.5*  < > = values in this interval not displayed.  Cardiac Enzymes  Recent Labs Lab 08/15/15 2245 08/16/15 0435 08/16/15 1030  TROPONINI 0.05* 0.06* 0.06*    Glucose  Recent Labs Lab 08/21/15 1146 08/21/15 1548 08/21/15 1959 08/22/15 0005 08/22/15 0346 08/22/15 0827  GLUCAP 122* 132* 130* 137* 107* 117*    Imaging No results found.  STUDIES:  5/30 CT Head: 3.7 x 1.9 x 2 cm Rt basal ganglia ICH 5/31 CT Head:  No significant change. 5.2x1.9x3.0cm right basal ganglia hematoma. Mild mass effect. 5/31 TTE: Severe concentric LVH with EF 50-55%. No regional wall motion abnormalities. Grade 2 diastolic dysfunction. LA & RA normal in size. RV normal in size and function. No aortic stenosis or regurgitation. Aortic root normal in size. Trivial mitral regurgitation without stenosis. No pulmonic regurgitation or stenosis. No tricuspid regurgitation. No pericardial effusion. 6/01 Port  CXR:  Increased bilateral interstitial markings. Unable to appreciate pleural effusion. ETT & CVL in stable position.  MICROBIOLOGY: MRSA PCR 5/31:  Negative  Sputum 6/2 GPC>>MSSA Blood 6/2>>  ANTIBIOTICS: 6/3 vanc>>6/4 6/3 zoysn>> 6/4 6/4 cefazolin >>    SIGNIFICANT EVENTS: 5/30 Admit 5/31 Start 3% NS>>  LINES/TUBES: OETT 7.5 5/31 >> R IJ CVL 5/31 >> OGT 5/31 >> FOLEY 5/31 >> PIV x3  ASSESSMENT / PLAN:  NEUROLOGIC A:   R Basal Ganglia ICH / CVA Sedation on Ventilator  P:   Management per Neurology RASS goal: 0 to -1 Propofol gtt, weaning to off 6/6 Versed IV prn Fentanyl gtt & IV prn 3% HS Saline IV per Neurology  PULMONARY A: VDRF Compromised Airway - Secondary to ICH.  Acute on chronic Diastolic CHF & 3% NaCl High risk for OSA  P:   Full vent support Daily SBT & WUA, assess for extubation 6/6 Holding on Lasix Diuresis until off 3% HS, note net negative May require empiric CPAP or BiPAp qhs given risk for OSA  CARDIOVASCULAR A:  Hypertensive Emergency Acute on Chronic Diastolic CHF - Likely secondary to 3% HS. Elevated Troponin I - Likely demand ischemia. No wall motion abnormalities on TTE. Grade 2 Diastolic CHF   P:  Vitals per Unit Protocol Monitoring on telemetry Cardene to keep SBP < 160, nicardipine gtt running Labetalol IV prn Lisinopril 20mg  QD, metoprolol 25 bid, Maxide-37.5 / 25 QD Holding on Lasix while on 3% saline  RENAL  Recent Labs Lab 08/22/15 0101 08/22/15 0500 08/22/15 0526  NA 153* 152* 153*    A:   Hypernatremia - Medically induced. Hyperchloremia - Medically induced.  P:   Goal Na 150 - 155 per Neuro Trending electrolytes & renal function daily Monitoring Na q6hr Following UOP with Foley  GASTROINTESTINAL A:   Nutrition.  P:   Continuing Tube feeds Pepcid VT qhs  HEMATOLOGIC  Recent Labs  08/21/15 0555 08/22/15 0526  HGB 13.1 11.8*   Wbc 11.2->13.1 A:   Anemia - Mild. Leukocytosis -  Mild.  P:  Trending cell counts w/ CBC SCDs for prophylaxis No chemical DVT ppx given ICH  INFECTIOUS A:   FUO - possibly secondary to  ICH.  MSSA bronchitis vs PNA P:   Follow Blood, Urine, & Tracheal Aspirate Cultures PCT 0.10 > 0.24 empiric abx narrowed to cefazolin on 6/4  ENDOCRINE CBG (last 3)   Recent Labs  08/22/15 0005 08/22/15 0346 08/22/15 0827  GLUCAP 137* 107* 117*   A:   No acute issues.    P:   Monitor blood sugar on daily labs.  FAMILY UPDATES:  No family at bedside 6/5.  TODAY'S SUMMARY:  30 yo male with headache, slurred speech and Lt side weakness from Rt basal ganglia ICH and diastolic CHF exacerbation. On cefazolin for MSSA in sputum. Assess MS and resp status for possible extubation 6/6Note high risk for OSA which will likely affect our extubation plans.   Independent CC time 35 minutes   Levy Pupa, MD, PhD 08/22/2015, 8:55 AM [facility] [phone] or if no answer [phone]

## 2015-08-22 NOTE — Progress Notes (Signed)
Pt is back on FS tolerating it well. No distress or complications noted

## 2015-08-22 NOTE — Progress Notes (Signed)
240ml wasted in sink of fentanyl 9010mcg/ml with Francesca JewettElaine T RN

## 2015-08-22 NOTE — Progress Notes (Signed)
PT Cancellation Note  Patient Details Name: [name] M Gettis MRN: [id number] DOB: 04/16/1985   Cancelled Treatment:    Reason Eval/Treat Not Completed: Patient not medically ready; remains on bedrest at this time.   Fabio AsaWerner, Jakyle Petrucelli J 08/22/2015, 7:19 AM [place] Crumbevon Enya Bureau, PT DPT  [phone]

## 2015-08-23 ENCOUNTER — Inpatient Hospital Stay (HOSPITAL_COMMUNITY): Payer: 59

## 2015-08-23 DIAGNOSIS — J9600 Acute respiratory failure, unspecified whether with hypoxia or hypercapnia: Secondary | ICD-10-CM | POA: Diagnosis present

## 2015-08-23 DIAGNOSIS — J9601 Acute respiratory failure with hypoxia: Secondary | ICD-10-CM | POA: Diagnosis present

## 2015-08-23 LAB — CULTURE, BLOOD (ROUTINE X 2)
Culture: NO GROWTH
Culture: NO GROWTH

## 2015-08-23 LAB — BASIC METABOLIC PANEL
Anion gap: 6 (ref 5–15)
Anion gap: 5 (ref 5–15)
BUN: 38 mg/dL — ABNORMAL HIGH (ref 6–20)
BUN: 38 mg/dL — ABNORMAL HIGH (ref 6–20)
CO2: 28 mmol/L (ref 22–32)
CO2: 28 mmol/L (ref 22–32)
Calcium: 8.9 mg/dL (ref 8.9–10.3)
Calcium: 9.1 mg/dL (ref 8.9–10.3)
Chloride: 122 mmol/L — ABNORMAL HIGH (ref 101–111)
Chloride: 120 mmol/L — ABNORMAL HIGH (ref 101–111)
Creatinine, Ser: 1.15 mg/dL (ref 0.61–1.24)
Creatinine, Ser: 1.13 mg/dL (ref 0.61–1.24)
GFR calc Af Amer: >60 mL/min (ref >60)
GFR calc Af Amer: >60 mL/min (ref >60)
GFR calc non Af Amer: >60 mL/min (ref >60)
GFR calc non Af Amer: >60 mL/min (ref >60)
Glucose, Bld: 150 mg/dL — ABNORMAL HIGH (ref 65–99)
Glucose, Bld: 128 mg/dL — ABNORMAL HIGH (ref 65–99)
Potassium: 3.6 mmol/L (ref 3.5–5.1)
Potassium: 3.2 mmol/L — ABNORMAL LOW (ref 3.5–5.1)
Sodium: 156 mmol/L — ABNORMAL HIGH (ref 135–145)
Sodium: 153 mmol/L — ABNORMAL HIGH (ref 135–145)

## 2015-08-23 LAB — ALDOSTERONE + RENIN ACTIVITY W/ RATIO
ALDO / PRA Ratio: <0.5 (ref 0.0–30.0)
Aldosterone: <1 ng/dL (ref 0.0–30.0)
PRA LC/MS/MS: 1.836 ng/mL/hr (ref 0.167–5.380)

## 2015-08-23 LAB — GLUCOSE, CAPILLARY
Glucose-Capillary: 131 mg/dL — ABNORMAL HIGH (ref 65–99)
Glucose-Capillary: 130 mg/dL — ABNORMAL HIGH (ref 65–99)
Glucose-Capillary: 128 mg/dL — ABNORMAL HIGH (ref 65–99)
Glucose-Capillary: 105 mg/dL — ABNORMAL HIGH (ref 65–99)
Glucose-Capillary: 118 mg/dL — ABNORMAL HIGH (ref 65–99)

## 2015-08-23 LAB — PROTIME-INR
INR: 1.18 (ref 0.00–1.49)
Prothrombin Time: 15.2 seconds (ref 11.6–15.2)

## 2015-08-23 LAB — SODIUM: Sodium: 156 mmol/L — ABNORMAL HIGH (ref 135–145)

## 2015-08-23 MED ORDER — ETOMIDATE 2 MG/ML IV SOLN
20.0000 mg | Freq: Once | INTRAVENOUS | Status: AC
  Administered 2015-08-23: 20 mg via INTRAVENOUS

## 2015-08-23 MED ORDER — MIDAZOLAM HCL 2 MG/2ML IJ SOLN
4.0000 mg | Freq: Once | INTRAMUSCULAR | Status: AC
  Administered 2015-08-23: 2 mg via INTRAVENOUS

## 2015-08-23 MED ORDER — VECURONIUM BROMIDE 10 MG IV SOLR
10.0000 mg | Freq: Once | INTRAVENOUS | Status: AC
  Administered 2015-08-23: 10 mg via INTRAVENOUS

## 2015-08-23 MED ORDER — MIDAZOLAM HCL 2 MG/2ML IJ SOLN
4.0000 mg | Freq: Once | INTRAMUSCULAR | Status: AC
  Filled 2015-08-23: qty 4

## 2015-08-23 MED ORDER — SODIUM CHLORIDE 0.9 % IV SOLN
INTRAVENOUS | Status: DC
  Administered 2015-08-26 – 2015-08-31 (×2): via INTRAVENOUS

## 2015-08-23 MED ORDER — VECURONIUM BROMIDE 10 MG IV SOLR
10.0000 mg | Freq: Once | INTRAVENOUS | Status: AC
  Filled 2015-08-23: qty 10

## 2015-08-23 MED ORDER — FENTANYL CITRATE (PF) 100 MCG/2ML IJ SOLN
200.0000 ug | Freq: Once | INTRAMUSCULAR | Status: AC
  Administered 2015-08-23: 100 ug via INTRAVENOUS

## 2015-08-23 MED ORDER — FENTANYL CITRATE (PF) 100 MCG/2ML IJ SOLN
200.0000 ug | Freq: Once | INTRAMUSCULAR | Status: AC
  Administered 2015-08-23: 100 ug via INTRAVENOUS
  Filled 2015-08-23: qty 4

## 2015-08-23 MED ORDER — PROPOFOL 500 MG/50ML IV EMUL
5.0000 ug/kg/min | Freq: Once | INTRAVENOUS | Status: AC
  Administered 2015-08-23: 40 ug/kg/min via INTRAVENOUS
  Filled 2015-08-23: qty 50

## 2015-08-23 MED ORDER — ETOMIDATE 2 MG/ML IV SOLN
40.0000 mg | Freq: Once | INTRAVENOUS | Status: AC
  Filled 2015-08-23: qty 20

## 2015-08-23 NOTE — Progress Notes (Signed)
Pt last Sodium level 156. Pt on 3% Saline at 12.285ml/hr. Notified Neurology MD. Orders given to stop 3% gtt. Will continue to monitor.

## 2015-08-23 NOTE — Procedures (Signed)
Percutaneous Tracheostomy Placement  Consent from family.  Patient sedated, paralyzed and position.  Placed on 100% FiO2 and RR matched.  Area cleaned and draped.  Lidocaine/epi injected.  Skin incision done followed by blunt dissection.  Trachea palpated then punctured, catheter passed and visualized bronchoscopically.  Wire placed and visualized.  Catheter removed.  Airway then entered and dilated.  Size 6 cuffed shiley trach placed and visualized bronchoscopically well above carina.  Good volume returns.  Patient tolerated the procedure well without complications.  Minimal blood loss.  CXR ordered and pending.  Wesam G. Yacoub, M.D. [facility]. Pager: [phone]. After hours pager: [phone].  

## 2015-08-23 NOTE — Progress Notes (Signed)
OT Cancellation Note  Patient Details Name: Brad Mcgee MRN: [id number] DOB: 01/05/1986   Cancelled Treatment:    Reason Eval/Treat Not Completed: Patient not medically ready (Plan for bedside trach today). Will follow up for OT eval tomorrow.  Gaye AlkenBailey A Tiphani Mells M.S., OTR/L Pager: [phone]  08/23/2015, 1:37 PM

## 2015-08-23 NOTE — Procedures (Addendum)
Bronchoscopy Procedure Note Brad Mcgee [id number] 07/27/1985  Procedure: Bronchoscopy Indications: To facilitate percutaneous tracheostomy placement  Procedure Details Consent: Risks of procedure as well as the alternatives and risks of each were explained to the (patient/caregiver).  Consent for procedure obtained. Time Out: Verified patient identification, verified procedure, site/side was marked, verified correct patient position, special equipment/implants available, medications/allergies/relevent history reviewed, required imaging and test results available.  Performed  In preparation for procedure, patient was given 100% FiO2. Sedation: Benzodiazepines, Muscle relaxants and Etomidate  FOB performed via ETT to allow perc trach placement. ETT withdrawn 5-6 cm and cannulation of trachea with needle and guidewire observed in real-time. There was no evidence of needle penetration of the posterior tracheal wall or wire injury seen. Please refer also to the trach procedure Note by Dr Molli KnockYacoub. After trach was placed there was no bleeding in the proximal trachea. FOB was withdrawn and reintroduced via the new trach. There was no distal bleeding seen. All airways inspected, no lesions, no significant secretions.   Airway entered and the following bronchi were examined: RUL, RML, RLL, LUL and LLL.     Evaluation Hemodynamic Status: Transient hypertension requiring treatment; O2 sats: stable throughout Patient's Current Condition: stable Specimens:  None Complications: No apparent complications Patient did tolerate procedure well.   [name], MD, PhD 08/23/2015, 3:27 PM [facility] [phone] or if no answer [phone]

## 2015-08-23 NOTE — Progress Notes (Signed)
PULMONARY / CRITICAL CARE MEDICINE   Name: Brad Mcgee MRN: [id number] DOB: [birth year]    ADMISSION DATE:  08/15/2015 CONSULTATION DATE:  08/16/2015  REFERRING MD:  Dr. Amada Jupiter  CHIEF COMPLAINT:  Left side weakness  HISTORY OF PRESENT ILLNESS:  Hx from chart. 30 yo male with slurred speech and Lt sided weakness.  This was associated with headache.  He was intubated for airway protection.  He had CT head which showed 3.7 x 1.9 x 2 cm Rt basal ganglia ICH which then increased to 4.5 x 1.9 x 3.4 cm ICH on f/u CT head.  His BP was 187/106.  SUBJECTIVE:  Currently on PSV 10 Moved R arm to command but still quite sleepy, lethargic   VITAL SIGNS: BP 144/67 mmHg  Pulse 88  Temp(Src) 100.5 F (38.1 C) (Rectal)  Resp 30  Ht 5\' 7"  (1.702 m)  Wt 131.8 kg (290 lb 9.1 oz)  BMI 45.50 kg/m2  SpO2 100%  HEMODYNAMICS:    VENTILATOR SETTINGS: Vent Mode:  [-] CPAP;PSV FiO2 (%):  [40 %] 40 % Set Rate:  [16 bmp] 16 bmp Vt Set:  [600 mL] 600 mL PEEP:  [5 cmH20] 5 cmH20 Pressure Support:  [5 cmH20-14 cmH20] 5 cmH20 Plateau Pressure:  [0 cmH20] 0 cmH20  INTAKE / OUTPUT: I/O last 3 completed shifts: In: 3280 [I.V.:1850; NG/GT:930; IV Piggyback:500] Out: 4155 [Urine:3865; Stool:290]  PHYSICAL EXAMINATION: General:  Obese. No distress. No family at bedside. Sedated Neuro:  Pupils symmetric. Moves R arm spont.  Opens eyes with stimulation. Follows some  Commands but not all HEENT:  ETT in place. No scleral icterus or injection. Cardiovascular:  Regular rate & rhythm. No appreciable JVD given body habitus. Lungs:  Distant breath sounds. Symmetric chest rise on vent. Diminshed in bases Abdomen:  Soft. Protuberant. Normal bowel sounds. Integument:  Warm & dry. No rash on exposed skin.  LABS:  BMET  Recent Labs Lab 08/21/15 0555  08/22/15 0500  08/22/15 1805 08/23/15 0041 08/23/15 0515  NA 151*  < > 152*  < > 155* 156* 156*  K 4.0  --  3.7  --   --   --  3.6  CL 117*  --  119*   --   --   --  122*  CO2 27  --  27  --   --   --  28  BUN 22*  --  33*  --   --   --  38*  CREATININE 0.92  --  1.00  --   --   --  1.15  GLUCOSE 133*  --  135*  --   --   --  150*  < > = values in this interval not displayed.  Electrolytes  Recent Labs Lab 08/20/15 0548 08/21/15 0555 08/22/15 0500 08/22/15 0526 08/23/15 0515  CALCIUM 8.7* 8.5* 8.5*  --  8.9  MG 1.9 2.4  --  2.4  --   PHOS 4.7* 3.9 3.4  --   --     CBC  Recent Labs Lab 08/20/15 0548 08/21/15 0555 08/22/15 0526  WBC 10.2 10.8* 10.6*  HGB 12.3* 13.1 11.8*  HCT 39.8 42.5 39.0  PLT 211 220 221    Coag's No results for input(s): APTT, INR in the last 168 hours.  Sepsis Markers  Recent Labs Lab 08/18/15 1432 08/19/15 0525 08/20/15 0548  PROCALCITON 0.10 <0.10 0.24    ABG No results for input(s): PHART, PCO2ART, PO2ART in the last 168 hours.  Liver Enzymes  Recent Labs Lab 08/20/15 0548 08/21/15 0555 08/22/15 0500  ALBUMIN 2.6* 2.9* 2.5*    Cardiac Enzymes  Recent Labs Lab 08/16/15 1030  TROPONINI 0.06*    Glucose  Recent Labs Lab 08/22/15 1150 08/22/15 1546 08/22/15 1959 08/22/15 2327 08/23/15 0354 08/23/15 0732  GLUCAP 112* 121* 115* 118* 131* 130*    Imaging Dg Chest Port 1 View  08/23/2015  CLINICAL DATA:  Acute respiratory failure acute EXAM: PORTABLE CHEST 1 VIEW COMPARISON:  08/21/2015 FINDINGS: Cardiomegaly again noted. Central mild vascular congestion without convincing pulmonary edema. Persistent left basilar atelectasis. Stable endotracheal and NG tube position. Stable right IJ central line position. IMPRESSION: Stable support apparatus. Cardiomegaly. Central mild vascular congestion without pulmonary edema. Left basilar atelectasis. Electronically Signed   By: Natasha MeadLiviu  Pop M.D.   On: 08/23/2015 07:59    STUDIES:  5/30 CT Head: 3.7 x 1.9 x 2 cm Rt basal ganglia ICH 5/31 CT Head:  No significant change. 5.2x1.9x3.0cm right basal ganglia hematoma. Mild mass  effect. 5/31 TTE: Severe concentric LVH with EF 50-55%. No regional wall motion abnormalities. Grade 2 diastolic dysfunction. LA & RA normal in size. RV normal in size and function. No aortic stenosis or regurgitation. Aortic root normal in size. Trivial mitral regurgitation without stenosis. No pulmonic regurgitation or stenosis. No tricuspid regurgitation. No pericardial effusion. 6/01 Port CXR:  Increased bilateral interstitial markings. Unable to appreciate pleural effusion. ETT & CVL in stable position.  MICROBIOLOGY: MRSA PCR 5/31:  Negative  Sputum 6/2 GPC>>MSSA Blood 6/2>>  ANTIBIOTICS: 6/3 vanc>>6/4 6/3 zoysn>> 6/4 6/4 cefazolin >>    SIGNIFICANT EVENTS: 5/30 Admit 5/31 Start 3% NS>> 6/7   LINES/TUBES: OETT 7.5 5/31 >> R IJ CVL 5/31 >> OGT 5/31 >> FOLEY 5/31 >> PIV x3  ASSESSMENT / PLAN:  NEUROLOGIC A:   R Basal Ganglia ICH / CVA Sedation on Ventilator  P:   Management per Neurology RASS goal: 0 to -1 Propofol gtt, will d/c  Fentanyl gtt IV prn 3% HS Saline IV per Neurology, held 6/7 am with Na 156  PULMONARY A: VDRF Compromised Airway - Secondary to ICH.  Acute on chronic Diastolic CHF & 3% NaCl High risk for OSA  P:   Full vent support Daily SBT & WUA, assess for extubation 6/7; I am concerned that he is still not awake enough to protect airway adequately Holding on Lasix Diuresis, note net negative while he has been on 3% Na Will require empiric CPAP or BiPAp qhs once extubated given risk for OSA  CARDIOVASCULAR A:  Hypertensive Emergency Acute on Chronic Diastolic CHF - Likely secondary to 3% HS. Elevated Troponin I - Likely demand ischemia. No wall motion abnormalities on TTE. Grade 2 Diastolic CHF   P:  Vitals per Unit Protocol Monitoring on telemetry Cardene to keep SBP < 160, nicardipine gtt running Labetalol IV prn Lisinopril 20mg  QD, metoprolol 25 bid, Maxide-37.5 / 25 QD Holding on Lasix for now  RENAL  Recent Labs Lab  08/22/15 1805 08/23/15 0041 08/23/15 0515  NA 155* 156* 156*    A:   Hypernatremia - Medically induced. Hyperchloremia - Medically induced.  P:   Goal Na 150 - 155 per Neuro Trending electrolytes & renal function daily Monitoring Na q6hr Following UOP with Foley  GASTROINTESTINAL A:   Nutrition.  P:   Continuing Tube feeds Pepcid VT qhs  HEMATOLOGIC  Recent Labs  08/21/15 0555 08/22/15 0526  HGB 13.1 11.8*   Wbc 11.2->13.1 A:  Anemia - Mild. Leukocytosis - Mild.  P:  Trending cell counts w/ CBC SCDs for prophylaxis No chemical DVT ppx given ICH  INFECTIOUS A:   FUO - possibly secondary to ICH.  MSSA bronchitis vs PNA P:   Follow Blood, Urine, & Tracheal Aspirate Cultures PCT 0.10 > 0.24 empiric abx narrowed to cefazolin on 6/4; 6/7 =  day #6 abx  ENDOCRINE CBG (last 3)   Recent Labs  08/22/15 2327 08/23/15 0354 08/23/15 0732  GLUCAP 118* 131* 130*   A:   No acute issues.    P:   Monitor blood sugar on daily labs.  FAMILY UPDATES:  No family at bedside 6/5.  TODAY'S SUMMARY:  30 yo male with headache, slurred speech and Lt side weakness from Rt basal ganglia ICH and diastolic CHF exacerbation. On cefazolin for MSSA in sputum. Assess MS and resp status for possible extubation 6/7. Note high risk for OSA which will likely affect our extubation plans.   Independent CC time 34 minutes   Levy Pupa, MD, PhD 08/23/2015, 9:44 AM [facility] [phone] or if no answer [phone]

## 2015-08-23 NOTE — Progress Notes (Addendum)
PT Cancellation Note  Patient Details Name: Brad Mcgee MRN: [id number] DOB: 07/13/1985   Cancelled Treatment:    Reason Eval/Treat Not Completed: Patient not medically ready.  Pt getting bedside trach at this time.  PT to check back tomorrow.    Thanks,   Rollene Rotundaebecca B. Elaria Osias, PT, DPT [phone]   08/23/2015, 10:01 AM

## 2015-08-23 NOTE — Progress Notes (Signed)
STROKE TEAM PROGRESS NOTE   SUBJECTIVE (INTERVAL HISTORY) Pt Mom at bedside. Patient still lethargic, but following some commands. Not able to tolerate well with weaning due to tachypenia. Likely needs trach. Weaned off 3% saline. BP stable off cardene.    OBJECTIVE Temp:  [98.4 F (36.9 C)-102.4 F (39.1 C)] 102.4 F (39.1 C) (06/07 1200) Pulse Rate:  [75-112] 83 (06/07 1700) Cardiac Rhythm:  [-] Normal sinus rhythm (06/07 1600) Resp:  [22-35] 23 (06/07 1700) BP: (122-187)/(59-118) 147/86 mmHg (06/07 1700) SpO2:  [98 %-100 %] 100 % (06/07 1700) FiO2 (%):  [40 %-100 %] 40 % (06/07 1700) Weight:  [290 lb 9.1 oz (131.8 kg)] 290 lb 9.1 oz (131.8 kg) (06/07 0500)  CBC:   Recent Labs Lab 08/21/15 0555 08/22/15 0526  WBC 10.8* 10.6*  HGB 13.1 11.8*  HCT 42.5 39.0  MCV 100.5* 101.0*  PLT 220 221    Basic Metabolic Panel:  Recent Labs Lab 08/21/15 0555  08/22/15 0500 08/22/15 0526  08/23/15 0041 08/23/15 0515  NA 151*  < > 152* 153*  < > 156* 156*  K 4.0  --  3.7  --   --   --  3.6  CL 117*  --  119*  --   --   --  122*  CO2 27  --  27  --   --   --  28  GLUCOSE 133*  --  135*  --   --   --  150*  BUN 22*  --  33*  --   --   --  38*  CREATININE 0.92  --  1.00  --   --   --  1.15  CALCIUM 8.5*  --  8.5*  --   --   --  8.9  MG 2.4  --   --  2.4  --   --   --   PHOS 3.9  --  3.4  --   --   --   --   < > = values in this interval not displayed.  Lipid Panel:     Component Value Date/Time   TRIG 331* 08/22/2015 0526   HgbA1c: No results found for: HGBA1C Urine Drug Screen:     Component Value Date/Time   LABOPIA NONE DETECTED 08/16/2015 0033   COCAINSCRNUR NONE DETECTED 08/16/2015 0033   LABBENZ NONE DETECTED 08/16/2015 0033   AMPHETMU NONE DETECTED 08/16/2015 0033   THCU NONE DETECTED 08/16/2015 0033   LABBARB NONE DETECTED 08/16/2015 0033     IMAGING I have personally reviewed the radiological images below and agree with the radiology interpretations.  Ct  Head Wo Contrast 08/20/2015  ADDENDUM: There is herniation of the cerebellar tonsils through the foramen magnum. This is stable when compared to previous studies and likely reflects Chiari malformation although herniation from mass-effect cannot be completely excluded. 08/20/2015  Stable right basal ganglia hemorrhage with mass-effect and slight midline shift.  08/16/2015   No significant change since the previous study. Right basal ganglia hematoma volume 15.4 cc. Slightly better defined surrounding edema.  08/16/2015   Increased size of acute right basal ganglia hemorrhage, with increased mass effect. There is now 2 mm right to left midline shift. There is developing supratentorial sulcal effacement and persistent basilar cistern effacement concerning for developing cerebral edema.  08/15/2015  Acute right basal ganglia hemorrhage measuring 3.7 x 1.9 x 2.0 cm. Mild surrounding edema and mass effect, no midline shift. There is crowding of the basilar  cisterns.   Ct Angio Head & Neck W/cm &/or Wo/cm 08/18/2015  1. Negative CTA of the head and neck. Please note that evaluation is somewhat limited by body habitus and timing of the contrast bolus. 2. Slight interval increase in size of acute right basal ganglia parenchymal hemorrhage, now measuring approximately 16 cc in estimated volume, previously 15.4 cc. Slightly increased and more defined surrounding vasogenic edema. Similar 2 mm right-to-left shift. Supratentorial sulcal effacement concerning for cerebral edema, similar to prior.   Dg Chest Port 1 View 08/23/2015 Stable support apparatus. Cardiomegaly. Central mild vascular congestion without pulmonary edema. Left basilar atelectasis. 08/21/2015   Cardiomegaly with vascular congestion. Improving interstitial edema.  08/20/2015  Mild interstitial edema/CHF.  08/17/2015   1. Lines and tubes in stable position . 2. Cardiomegaly with increased interstitial markings consistent with congestive heart failure. Small left  pleural effusion cannot be excluded.  08/16/2015  1. Tip of the right central line in the mid SVC.  No pneumothorax. 2. Endotracheal and enteric tubes remain in place. 3. Unchanged enlargement of the cardiac silhouette. Decreased vascular congestion and edema from prior.  08/16/2015   1. Endotracheal tube seen ending 2-3 cm above the carina. 2. Lungs mildly hypoexpanded. Vascular congestion noted. Increased interstitial markings raise concern for mild pulmonary edema, though pneumonia could have a similar appearance.   Dg Abd Portable 1v 08/16/2015  No acute findings per Enteric tube with tip in the right upper quadrant likely over the distal stomach or proximal duodenum.   2D Echocardiogram  Left ventricle: The cavity size was normal. There was severe concentric hypertrophy. Systolic function was normal. The estimated ejection fraction was in the range of 50% to 55%. Wall motion was normal; there were no regional wall motion abnormalities. Features are consistent with a pseudonormal left ventricular filling pattern, with concomitant abnormal relaxation and increased filling pressure (grade 2 diastolic dysfunction). Doppler parameters are consistent with high ventricular filling pressure.   PHYSICAL EXAM GENERAL- Intubated, sedated, appears as stated age, not in acute distress, Obese. HEENT- Atraumatic, normocephalic, Pupils reactive and equal CARDIAC- regular rate and rhythm, no murmurs, rubs or gallops. NEUROLOGICAL EXAM : intubated and off sedation, able to follow simple commands on the right side. eyes open on commands, Pupils 2 mm reactive. Fundi not visualized. Dolls eye present, slightly dysconjugate gaze with mild exotropia left eye. Corneal reflex intact but more brisk on the right, no obvious facial asymetry due to ET tube, LLE 2+/5 to pain and LUE 3/5 spontaneous movement, RUE and RLE trace withdraws to pain. Sensation, coordination and gait not tested.   ASSESSMENT/PLAN [name] is a 30 y.o. male with history of hypertension presenting with L sided weakness. CT showed a L basal ganglia hemorrhage, with change in mental status, pt had repeat Ct head which showed mildly increased size of hmg, and increased mass effect. Pt and was subsequently intubated.  Stroke:  Right BG ICH hemorrhage secondary to hypertension   Resultant  L hemiparesis, VDRF  Initial CT R BG hemorrhage 3.7x1.9x2, mild edema & mass effect  Repeat CT with neuro worsening increased size of R BG hmg w/ increased mass effect and cerebral edema.   Repeat CT same this afternoon  Stable hematoma. Unchanged shift or edema.  CTA head & neck unremarkable  2D Echo  EF 50-55%. No source of embolus   SCDs and lovenox for VTE prophylaxis Diet NPO time specified  Diet NPO time specified, on tube feeds.  No antithrombotic prior  to admission, not on antithrombotics due to ICH  Ongoing aggressive stroke risk factor management  Therapy recommendations:  pending   Disposition:  pending   Acute respiratory failure MSSA bronchitis vs PNA  Intubated in the ED  CCM managing vent, Off propofol and fentanyl this am  MSSA in sputum. abx narrowed from zosyn to ancef  Not tolerating weaning well  May need trach  Cerebral edema  Off 3% saline  Hypertensive Emergency  BP 182/124 on arrival in setting of acute neurologic symptoms  BP < 160 since 0400  Off cardene at 2a this am  On prinvil 20, metoprolol 50 bid, triamterene-HCTZ 37.2/25   At SBP goal < 160  Other Stroke Risk Factors  Morbid Obesity, Body mass index is 45.5 kg/(m^2).   Acute on chronic diastolic CHF, grade 2 chronic CHF  High risk OSA. OP eval recommended  Other Active Problems  Anemia, mild  Leukocytosis, mild  Fever of unknown origin, ? Related to [facility]  Hospital day # 7   This patient is critically ill due to ICH, respiratory failure, hypertensive emergency and at significant risk of neurological worsening,  death form hematoma expansion, heart failure, cerebral edema and brain herniation. This patient's care requires constant monitoring of vital signs, hemodynamics, respiratory and cardiac monitoring, review of multiple databases, neurological assessment, discussion with family, other specialists and medical decision making of high complexity. I spent 40 minutes of neurocritical care time in the care of this patient.  Marvel Plan, MD PhD Stroke Neurology 08/23/2015 5:44 PM

## 2015-08-23 NOTE — Procedures (Signed)
Bedside Tracheostomy Insertion Procedure Note   Patient Details:   Name: Brad Mcgee DOB: 06/27/1985 MRN: [id number]  Procedure: Tracheostomy  Pre Procedure Assessment: ET Tube Size: 7.5 ET Tube secured at lip (cm): 23  Bite block in place: No Breath Sounds: Diminished  Post Procedure Assessment: BP 168/96 mmHg  Pulse 112  Temp(Src) 102.4 F (39.1 C) (Rectal)  Resp 28  Ht 5\' 7"  (1.702 m)  Wt 290 lb 9.1 oz (131.8 kg)  BMI 45.50 kg/m2  SpO2 98% O2 sats: stable throughout Complications: No apparent complications Patient did tolerate procedure well Tracheostomy Brand:Shiley Tracheostomy Style:Cuffed Tracheostomy Size: 6.0 Tracheostomy Secured NWG:NFAOZHYvia:Sutures Tracheostomy Placement Confirmation:Trach cuff visualized and in place and Chest X ray ordered for placement    Loletta SpecterSmith, Roizy Harold Sands 08/23/2015, 3:29 PM

## 2015-08-24 ENCOUNTER — Inpatient Hospital Stay (HOSPITAL_COMMUNITY): Payer: 59

## 2015-08-24 DIAGNOSIS — R21 Rash and other nonspecific skin eruption: Secondary | ICD-10-CM

## 2015-08-24 DIAGNOSIS — G8194 Hemiplegia, unspecified affecting left nondominant side: Secondary | ICD-10-CM

## 2015-08-24 LAB — GLUCOSE, CAPILLARY
Glucose-Capillary: 123 mg/dL — ABNORMAL HIGH (ref 65–99)
Glucose-Capillary: 126 mg/dL — ABNORMAL HIGH (ref 65–99)
Glucose-Capillary: 136 mg/dL — ABNORMAL HIGH (ref 65–99)
Glucose-Capillary: 138 mg/dL — ABNORMAL HIGH (ref 65–99)
Glucose-Capillary: 150 mg/dL — ABNORMAL HIGH (ref 65–99)
Glucose-Capillary: 150 mg/dL — ABNORMAL HIGH (ref 65–99)

## 2015-08-24 LAB — METANEPHRINES, PLASMA
Metanephrine, Free: 54 pg/mL (ref 0–62)
Normetanephrine, Free: 364 pg/mL — ABNORMAL HIGH (ref 0–145)

## 2015-08-24 LAB — BASIC METABOLIC PANEL
Anion gap: 5 (ref 5–15)
BUN: 36 mg/dL — ABNORMAL HIGH (ref 6–20)
CO2: 29 mmol/L (ref 22–32)
Calcium: 8.8 mg/dL — ABNORMAL LOW (ref 8.9–10.3)
Chloride: 117 mmol/L — ABNORMAL HIGH (ref 101–111)
Creatinine, Ser: 0.93 mg/dL (ref 0.61–1.24)
GFR calc Af Amer: >60 mL/min (ref >60)
GFR calc non Af Amer: >60 mL/min (ref >60)
Glucose, Bld: 151 mg/dL — ABNORMAL HIGH (ref 65–99)
Potassium: 3.3 mmol/L — ABNORMAL LOW (ref 3.5–5.1)
Sodium: 151 mmol/L — ABNORMAL HIGH (ref 135–145)

## 2015-08-24 LAB — CATECHOLAMINES, FRACTIONATED, PLASMA
Dopamine: 169 pg/mL — ABNORMAL HIGH (ref 0–48)
Epinephrine: 125 pg/mL — ABNORMAL HIGH (ref 0–62)
Norepinephrine: 1401 pg/mL — ABNORMAL HIGH (ref 0–874)

## 2015-08-24 LAB — PHOSPHORUS: Phosphorus: 3.9 mg/dL (ref 2.5–4.6)

## 2015-08-24 LAB — MAGNESIUM: Magnesium: 2.5 mg/dL — ABNORMAL HIGH (ref 1.7–2.4)

## 2015-08-24 MED ORDER — METOPROLOL TARTRATE 25 MG/10 ML ORAL SUSPENSION
50.0000 mg | Freq: Two times a day (BID) | ORAL | Status: DC
  Administered 2015-08-24 – 2015-09-01 (×11): 50 mg
  Filled 2015-08-24 (×15): qty 20

## 2015-08-24 MED ORDER — LISINOPRIL 20 MG PO TABS
20.0000 mg | ORAL_TABLET | Freq: Two times a day (BID) | ORAL | Status: DC
  Administered 2015-08-24 – 2015-09-01 (×10): 20 mg via ORAL
  Filled 2015-08-24 (×12): qty 1

## 2015-08-24 MED ORDER — POTASSIUM CHLORIDE 10 MEQ/50ML IV SOLN
10.0000 meq | INTRAVENOUS | Status: AC
  Administered 2015-08-24 (×4): 10 meq via INTRAVENOUS
  Filled 2015-08-24 (×4): qty 50

## 2015-08-24 MED ORDER — ADULT MULTIVITAMIN LIQUID CH
15.0000 mL | Freq: Every day | ORAL | Status: DC
  Administered 2015-08-25 – 2015-08-31 (×5): 15 mL
  Filled 2015-08-24 (×8): qty 15

## 2015-08-24 MED ORDER — VITAL AF 1.2 CAL PO LIQD
1000.0000 mL | ORAL | Status: DC
  Administered 2015-08-24 – 2015-08-27 (×6): 1000 mL
  Filled 2015-08-24 (×7): qty 1000

## 2015-08-24 MED ORDER — FENTANYL CITRATE (PF) 100 MCG/2ML IJ SOLN
25.0000 ug | INTRAMUSCULAR | Status: DC | PRN
  Administered 2015-08-25: 50 ug via INTRAVENOUS
  Filled 2015-08-24: qty 2

## 2015-08-24 MED ORDER — METOPROLOL TARTRATE 25 MG/10 ML ORAL SUSPENSION: 75.0000 mg | Freq: Two times a day (BID) | ORAL | Status: DC

## 2015-08-24 NOTE — Progress Notes (Signed)
Inpatient Rehabilitation  Patient was screened by Fae PippinMelissa Izella Ybanez for appropriateness for an Inpatient Acute Rehab consult.  At this time we are recommending an Inpatient Rehab consult.  MD paged to request order; please order if you are agreeable.    Charlane FerrettiMelissa Yuvraj Pfeifer, M.A., CCC/SLP Admission Coordinator  [facility]  Cell [phone]

## 2015-08-24 NOTE — Progress Notes (Addendum)
PULMONARY / CRITICAL CARE MEDICINE   Name: Brad Mcgee MRN: [id number] DOB: 10/25/1985    ADMISSION DATE:  08/15/2015 CONSULTATION DATE:  08/16/2015  REFERRING MD:  Dr. Amada JupiterKirkpatrick  CHIEF COMPLAINT:  Left side weakness  HISTORY OF PRESENT ILLNESS:  Hx from chart. 30 yo male with slurred speech and Lt sided weakness.  This was associated with headache.  He was intubated for airway protection.  He had CT head which showed 3.7 x 1.9 x 2 cm Rt basal ganglia ICH which then increased to 4.5 x 1.9 x 3.4 cm ICH on f/u CT head.  His BP was 187/106.  SUBJECTIVE:  Awake Trach went smoothly 6/7, currently on ATC   VITAL SIGNS: BP 133/73 mmHg  Pulse 84  Temp(Src) 99.9 F (37.7 C) (Rectal)  Resp 28  Ht 5\' 7"  (1.702 m)  Wt 131 kg (288 lb 12.8 oz)  BMI 45.22 kg/m2  SpO2 100%  HEMODYNAMICS:    VENTILATOR SETTINGS: Vent Mode:  [-] PRVC FiO2 (%):  [40 %-100 %] 40 % Set Rate:  [16 bmp-24 bmp] 16 bmp Vt Set:  [600 mL] 600 mL PEEP:  [5 cmH20] 5 cmH20 Pressure Support:  [10 cmH20] 10 cmH20 Plateau Pressure:  [15 cmH20] 15 cmH20  INTAKE / OUTPUT: I/O last 3 completed shifts: In: 2504.6 [I.V.:1190; NG/GT:814.6; IV Piggyback:500] Out: 3611 [Urine:3340; Stool:271]  PHYSICAL EXAMINATION: General:  Obese. No distress. Neuro:  Pupils symmetric. Moves R arm spont and to command.  Opens eyes with stimulation. Follows some commands. L hemiplegia HEENT: Trach in place. No scleral icterus or injection. Cardiovascular:  Regular rate & rhythm. No appreciable JVD given body habitus. Lungs:  Distant breath sounds. Symmetric chest rise on vent. Diminshed in bases Abdomen:  Soft. Protuberant. Normal bowel sounds. Integument:  Warm & dry. No rash on exposed skin.  LABS:  BMET  Recent Labs Lab 08/23/15 0515 08/23/15 1730 08/24/15 0350  NA 156* 153* 151*  K 3.6 3.2* 3.3*  CL 122* 120* 117*  CO2 28 28 29   BUN 38* 38* 36*  CREATININE 1.15 1.13 0.93  GLUCOSE 150* 128* 151*     Electrolytes  Recent Labs Lab 08/21/15 0555 08/22/15 0500 08/22/15 0526 08/23/15 0515 08/23/15 1730 08/24/15 0350  CALCIUM 8.5* 8.5*  --  8.9 9.1 8.8*  MG 2.4  --  2.4  --   --  2.5*  PHOS 3.9 3.4  --   --   --  3.9    CBC  Recent Labs Lab 08/20/15 0548 08/21/15 0555 08/22/15 0526  WBC 10.2 10.8* 10.6*  HGB 12.3* 13.1 11.8*  HCT 39.8 42.5 39.0  PLT 211 220 221    Coag's  Recent Labs Lab 08/23/15 1100  INR 1.18    Sepsis Markers  Recent Labs Lab 08/18/15 1432 08/19/15 0525 08/20/15 0548  PROCALCITON 0.10 <0.10 0.24    ABG No results for input(s): PHART, PCO2ART, PO2ART in the last 168 hours.  Liver Enzymes  Recent Labs Lab 08/20/15 0548 08/21/15 0555 08/22/15 0500  ALBUMIN 2.6* 2.9* 2.5*    Cardiac Enzymes No results for input(s): TROPONINI, PROBNP in the last 168 hours.  Glucose  Recent Labs Lab 08/23/15 0732 08/23/15 1538 08/23/15 1923 08/23/15 2328 08/24/15 0327 08/24/15 0801  GLUCAP 130* 128* 105* 118* 150* 136*    Imaging Dg Chest Port 1 View  08/24/2015  CLINICAL DATA:  Acute respiratory failure, intracranial hemorrhage EXAM: PORTABLE CHEST 1 VIEW COMPARISON:  Portable chest x-ray of August 23, 2015 FINDINGS:  The cardiopericardial silhouette remains enlarged. The retrocardiac density has decreased somewhat. The interstitial markings remain prominent in the right lung. The tracheostomy appliance tip lies at the level of the clavicular heads. The feeding tube tip projects below the inferior margin of the image. The right internal jugular venous catheter tip projects over the midportion of the SVC. IMPRESSION: Slight interval improvement in left lower lobe atelectasis or pneumonia. Stable cardiomegaly and stable mild interstitial edema. The support tubes are in reasonable position. Electronically Signed   By: David  [place] M.D.   On: 08/24/2015 08:09   Dg Chest Port 1 View  08/23/2015  CLINICAL DATA:  30 year old male status post  tracheostomy placement. EXAM: PORTABLE CHEST 1 VIEW COMPARISON:  Prior chest x-ray obtained earlier today at 6:09 a.m. how FINDINGS: Interval placement of a tracheostomy tube. The tube tip is midline and at the level of the clavicles. There is no evidence of pneumothorax. The gastric tube has been removed and replaced with an enteric feeding tube. The tip of the enteric feeding tube is not visualized but lies below the diaphragm likely within the stomach or small bowel. Right IJ approach central venous catheter remains in unchanged position with the tip overlying the superior cavoatrial junction. Stable cardiomegaly, pulmonary vascular congestion and bibasilar atelectasis. IMPRESSION: 1. Interval placement of a tracheostomy tube without evidence of immediate complication. 2. Stable cardiomegaly, pulmonary vascular congestion, low inspiratory volumes and bibasilar atelectasis. 3. Interval removal of gastric tube and placement of an enteric feeding tube. Electronically Signed   By: Malachy Moan M.D.   On: 08/23/2015 16:54   Dg Abd Portable 1v  08/23/2015  CLINICAL DATA:  Feeding tube placement EXAM: PORTABLE ABDOMEN - 1 VIEW COMPARISON:  08/16/2015 FINDINGS: There is normal small bowel gas pattern. There is NG feeding tube probable within proximal duodenum. Please note the tip of the tube is not included on the film. IMPRESSION: NG tube probable within proximal duodenum. Please note the tip of the feeding tube is not included on the film. Electronically Signed   By: Natasha Mead M.D.   On: 08/23/2015 17:01    STUDIES:  5/30 CT Head: 3.7 x 1.9 x 2 cm Rt basal ganglia ICH 5/31 CT Head:  No significant change. 5.2x1.9x3.0cm right basal ganglia hematoma. Mild mass effect. 5/31 TTE: Severe concentric LVH with EF 50-55%. No regional wall motion abnormalities. Grade 2 diastolic dysfunction. LA & RA normal in size. RV normal in size and function. No aortic stenosis or regurgitation. Aortic root normal in size.  Trivial mitral regurgitation without stenosis. No pulmonic regurgitation or stenosis. No tricuspid regurgitation. No pericardial effusion. 6/01 Port CXR:  Increased bilateral interstitial markings. Unable to appreciate pleural effusion. ETT & CVL in stable position.  MICROBIOLOGY: MRSA PCR 5/31:  Negative  Sputum 6/2 GPC>>MSSA Blood 6/2>>  ANTIBIOTICS: 6/3 vanc>>6/4 6/3 zoysn>> 6/4 6/4 cefazolin >>    SIGNIFICANT EVENTS: 5/30 Admit 5/31 Start 3% NS>> 6/7   LINES/TUBES: OETT 7.5 5/31 >> 6/7 R IJ CVL 5/31 >> OGT 5/31 >> FOLEY 5/31 >> PIV x3  ASSESSMENT / PLAN:  NEUROLOGIC A:   R Basal Ganglia ICH / CVA Sedation on Ventilator  P:   Management per Neurology RASS goal: 0 Fentanyl IV prn 3% HS Saline IV per Neurology, held 6/7 am with Na 156, 151 on 6/8  PULMONARY A: VDRF Compromised Airway - Secondary to ICH.  Acute on chronic Diastolic CHF & 3% NaCl High risk for OSA  P:   ATC as  tolerated, trach open at night; may need MV at night short term given his presumed OSA Holding on Lasix Diuresis, note net negative while he has been on 3% Na Would need CPAP if / when decannulated  CARDIOVASCULAR A:  Hypertensive Emergency Acute on Chronic Diastolic CHF - Likely secondary to 3% HS. Elevated Troponin I - Likely demand ischemia. No wall motion abnormalities on TTE. Grade 2 Diastolic CHF   P:  Vitals per Unit Protocol Monitoring on telemetry Cardene to keep SBP < 160, now weaned to off Labetalol IV prn Lisinopril 20mg  QD, metoprolol 25 bid, Maxide-37.5 / 25 QD Holding on Lasix for now  RENAL  Recent Labs Lab 08/23/15 0515 08/23/15 1730 08/24/15 0350  NA 156* 153* 151*    A:   Hypernatremia - Medically induced. Hyperchloremia - Medically induced.  P:   Goal Na 150 - 155 per Neuro Trending electrolytes & renal function daily Monitoring Na q6hr Following UOP with Foley  GASTROINTESTINAL A:   Nutrition.  P:   Continuing Tube feeds, assess for  cuff down w SLP, possibly diet Pepcid VT qhs  HEMATOLOGIC  Recent Labs  08/22/15 0526  HGB 11.8*   Wbc 11.2->13.1 A:   Anemia - Mild. Leukocytosis - Mild.  P:  Trending cell counts w/ CBC SCDs for prophylaxis On enoxaparin for DVT prophylaxis given stability of Head Ct  INFECTIOUS A:   FUO - possibly secondary to ICH.  MSSA bronchitis vs PNA P:   Follow Blood, Urine, & Tracheal Aspirate Cultures PCT 0.10 > 0.24 empiric abx narrowed to cefazolin on 6/4; 6/8 =  day #7 of 10 abx  ENDOCRINE CBG (last 3)   Recent Labs  08/23/15 2328 08/24/15 0327 08/24/15 0801  GLUCAP 118* 150* 136*   A:   No acute issues.    P:   Monitor blood sugar on daily labs.  FAMILY UPDATES:  No family at bedside 6/5.  TODAY'S SUMMARY:  30 yo male with headache, slurred speech and Lt side weakness from Rt basal ganglia ICH and diastolic CHF exacerbation. On cefazolin for MSSA in sputum. Trach placed on 6/8, now on ATC. May be SDU candidate.   Independent CC time 31 minutes   Levy Pupa, MD, PhD 08/24/2015, 8:52 AM [place] [phone] or if no answer [phone]

## 2015-08-24 NOTE — Progress Notes (Addendum)
STROKE TEAM PROGRESS NOTE   SUBJECTIVE (INTERVAL HISTORY) FMLA papers completed and given to nursing staff to give to family yesterday. Pt had trach yesterday and today more awake alert and following commands well. But had rashes at waist and all 4 extremities. Discussed with inpt pharmacy and will d/c ancef and lisinopril.    OBJECTIVE Temp:  [98.2 F (36.8 C)-102.4 F (39.1 C)] 99.9 F (37.7 C) (06/08 0800) Pulse Rate:  [72-112] 84 (06/08 0823) Cardiac Rhythm:  [-] Normal sinus rhythm (06/08 0800) Resp:  [22-35] 28 (06/08 0823) BP: (122-187)/(68-118) 133/73 mmHg (06/08 0800) SpO2:  [98 %-100 %] 100 % (06/08 0823) FiO2 (%):  [40 %-100 %] 40 % (06/08 0823) Weight:  [131 kg (288 lb 12.8 oz)] 131 kg (288 lb 12.8 oz) (06/08 0359)  CBC:   Recent Labs Lab 08/21/15 0555 08/22/15 0526  WBC 10.8* 10.6*  HGB 13.1 11.8*  HCT 42.5 39.0  MCV 100.5* 101.0*  PLT 220 221    Basic Metabolic Panel:  Recent Labs Lab 08/22/15 0500 08/22/15 0526  08/23/15 1730 08/24/15 0350  NA 152* 153*  < > 153* 151*  K 3.7  --   < > 3.2* 3.3*  CL 119*  --   < > 120* 117*  CO2 27  --   < > 28 29  GLUCOSE 135*  --   < > 128* 151*  BUN 33*  --   < > 38* 36*  CREATININE 1.00  --   < > 1.13 0.93  CALCIUM 8.5*  --   < > 9.1 8.8*  MG  --  2.4  --   --  2.5*  PHOS 3.4  --   --   --  3.9  < > = values in this interval not displayed.  Lipid Panel:     Component Value Date/Time   TRIG 331* 08/22/2015 0526   HgbA1c: No results found for: HGBA1C Urine Drug Screen:     Component Value Date/Time   LABOPIA NONE DETECTED 08/16/2015 0033   COCAINSCRNUR NONE DETECTED 08/16/2015 0033   LABBENZ NONE DETECTED 08/16/2015 0033   AMPHETMU NONE DETECTED 08/16/2015 0033   THCU NONE DETECTED 08/16/2015 0033   LABBARB NONE DETECTED 08/16/2015 0033     IMAGING I have personally reviewed the radiological images below and agree with the radiology interpretations.  Ct Head Wo Contrast 08/20/2015  ADDENDUM: There  is herniation of the cerebellar tonsils through the foramen magnum. This is stable when compared to previous studies and likely reflects Chiari malformation although herniation from mass-effect cannot be completely excluded. 08/20/2015  Stable right basal ganglia hemorrhage with mass-effect and slight midline shift.  08/16/2015   No significant change since the previous study. Right basal ganglia hematoma volume 15.4 cc. Slightly better defined surrounding edema.  08/16/2015   Increased size of acute right basal ganglia hemorrhage, with increased mass effect. There is now 2 mm right to left midline shift. There is developing supratentorial sulcal effacement and persistent basilar cistern effacement concerning for developing cerebral edema.  08/15/2015  Acute right basal ganglia hemorrhage measuring 3.7 x 1.9 x 2.0 cm. Mild surrounding edema and mass effect, no midline shift. There is crowding of the basilar cisterns.   Ct Angio Head & Neck W/cm &/or Wo/cm 08/18/2015  1. Negative CTA of the head and neck. Please note that evaluation is somewhat limited by body habitus and timing of the contrast bolus. 2. Slight interval increase in size of acute right basal ganglia  parenchymal hemorrhage, now measuring approximately 16 cc in estimated volume, previously 15.4 cc. Slightly increased and more defined surrounding vasogenic edema. Similar 2 mm right-to-left shift. Supratentorial sulcal effacement concerning for cerebral edema, similar to prior.   Dg Chest Port 1 View 08/24/2015 Slight interval improvement in left lower lobe atelectasis or pneumonia. Stable cardiomegaly and stable mild interstitial edema. The support tubes are in reasonable position.  08/23/2015 Stable support apparatus. Cardiomegaly. Central mild vascular congestion without pulmonary edema. Left basilar atelectasis. 08/21/2015   Cardiomegaly with vascular congestion. Improving interstitial edema.  08/20/2015  Mild interstitial edema/CHF.  08/17/2015   1.  Lines and tubes in stable position . 2. Cardiomegaly with increased interstitial markings consistent with congestive heart failure. Small left pleural effusion cannot be excluded.  08/16/2015  1. Tip of the right central line in the mid SVC.  No pneumothorax. 2. Endotracheal and enteric tubes remain in place. 3. Unchanged enlargement of the cardiac silhouette. Decreased vascular congestion and edema from prior.  08/16/2015   1. Endotracheal tube seen ending 2-3 cm above the carina. 2. Lungs mildly hypoexpanded. Vascular congestion noted. Increased interstitial markings raise concern for mild pulmonary edema, though pneumonia could have a similar appearance.   Dg Abd Portable 1v 08/16/2015  No acute findings per Enteric tube with tip in the right upper quadrant likely over the distal stomach or proximal duodenum.   2D Echocardiogram  Left ventricle: The cavity size was normal. There was severe concentric hypertrophy. Systolic function was normal. The estimated ejection fraction was in the range of 50% to 55%. Wall motion was normal; there were no regional wall motion abnormalities. Features are consistent with a pseudonormal left ventricular filling pattern, with concomitant abnormal relaxation and increased filling pressure (grade 2 diastolic dysfunction). Doppler parameters are consistent with high ventricular filling pressure.   PHYSICAL EXAM GENERAL- trach in place, on vent, off sedation, appears as stated age, not in acute distress, Obese. HEENT- Atraumatic, normocephalic, Pupils reactive and equal CARDIAC- regular rate and rhythm, no murmurs, rubs or gallops. NEUROLOGICAL EXAM : trach in place, on vent, off sedation, eye open, able to follow simple commands on the right side. Eyes open able to communicate using right hand, Pupils 2 mm reactive. Fundi not visualized. Eyes mid position, slightly dysconjugate gaze with mild exotropia left eye. Corneal reflex intact but more brisk on the right, left  facial droop, tongue in mid position, LLE and LUE 0/5 and no spontaneous movement, RUE and RLE 4/5 with spontaneous movement. Sensation, coordination and gait not tested.   ASSESSMENT/PLAN Brad Mcgee is a 30 y.o. male with history of hypertension presenting with L sided weakness. CT showed a L basal ganglia hemorrhage, with change in mental status, pt had repeat Ct head which showed mildly increased size of hmg, and increased mass effect. Pt and was subsequently intubated with difficulty weaning.  Stroke:  Right BG ICH hemorrhage secondary to hypertension   Resultant  L hemiplegia, VDRF  Initial CT R BG hemorrhage 3.7x1.9x2, mild edema & mass effect  Repeat CT with neuro worsening increased size of R BG hmg w/ increased mass effect and cerebral edema.   Repeat CT same this afternoon  Stable hematoma. Unchanged shift or edema.  CTA head & neck unremarkable  2D Echo  EF 50-55%. No source of embolus   SCDs and lovenox for VTE prophylaxis  Diet NPO time specified, on tube feeds.  No antithrombotic prior to admission, not on antithrombotics due to ICH  Ongoing aggressive  stroke risk factor management  Therapy recommendations:  pending   Disposition:  pending   Acute respiratory failure MSSA bronchitis vs PNA  Intubated in the ED  CCM managing vent, Off propofol and fentanyl this am  MSSA in sputum. abx narrowed from zosyn to ancef  Did not tolerate wean  Trach placement by CCM 08/23/2015  Rash - allergic  Discussed with inpt pharmacy and ancef was considered as most likely culprit, metoprolol less likely. Pt was on lisinopril at home  D/c ancef since Abx treatment has been 6 days now  WBC normalized   If pt continue to have fever, will consider Abx in different category by CCM  Continue lisinopril and metoprolol  Hypertensive Emergency  BP 182/124 on arrival in setting of acute neurologic symptoms  BP goal < 160   Treated with cardene, now off  On  metoprolol, lisinopril, triamterene-HCTZ 37.2/25   Other Stroke Risk Factors  Morbid Obesity, Body mass index is 45.22 kg/(m^2).   Acute on chronic diastolic CHF, grade 2 chronic CHF  High risk OSA. OP eval recommended  Other Active Problems  Anemia, mild  Leukocytosis, mild  Fever of unknown origin, ? [facility]  Hospital day # 8   This patient is critically ill due to ICH, respiratory failure, hypertensive emergency and at significant risk of neurological worsening, death form hematoma expansion, heart failure, cerebral edema and brain herniation. This patient's care requires constant monitoring of vital signs, hemodynamics, respiratory and cardiac monitoring, review of multiple databases, neurological assessment, discussion with family, other specialists and medical decision making of high complexity. I spent 40 minutes of neurocritical care time in the care of this patient.  Marvel PlanJindong Jordana Dugue, MD PhD Stroke Neurology 08/24/2015 8:36 AM

## 2015-08-24 NOTE — Evaluation (Signed)
Physical Therapy Evaluation Patient Details Name: Brad Mcgee MRN: [id number] DOB: 03/18/1985 Today's Date: 08/24/2015   History of Present Illness  30 yo male with slurred speech and Lt sided weakness. This was associated with headache. He was intubated for airway protection. He had CT head which showed 3.7 x 1.9 x 2 cm Rt basal ganglia ICH which then increased to 4.5 x 1.9 x 3.4 cm ICH on f/u CT head. S/p trach on 08/23/15  Clinical Impression  Patient demonstrates deficits in functional mobility as indicated below. Will need continued skilled PT to address deficits and maximize function. Will see as indicated and progress as tolerated. Given patient history of independence prior to admission and current level of impairments, anticipate patient will need comprehensive therapies upon acute discharge. Recommend CIR consult.    Follow Up Recommendations CIR;Supervision/Assistance - 24 hour    Equipment Recommendations  Other (comment) (TBD)    Recommendations for Other Services Rehab consult     Precautions / Restrictions Precautions Precautions: Fall Precaution Comments: watch BP, and O2 saturations on ATC Restrictions Weight Bearing Restrictions: No      Mobility  Bed Mobility Overal bed mobility: Needs Assistance;+2 for physical assistance;+ 2 for safety/equipment Bed Mobility: Supine to Sit;Sit to Supine     Supine to sit: Max assist;+2 for physical assistance;+2 for safety/equipment;HOB elevated Sit to supine: Total assist;+2 for physical assistance   General bed mobility comments: Max assist of 2 persons to come to EOB, patient able to initiate RLE movement to EOB but required maximal assist for all other aspects of mobility, Total assist of 2 persons to return to supine and reposition  Transfers                 General transfer comment: mechanical use of Bed to position in chair position  Ambulation/Gait                Stairs             Wheelchair Mobility    Modified Rankin (Stroke Patients Only) Modified Rankin (Stroke Patients Only) Pre-Morbid Rankin Score: No symptoms Modified Rankin: Severe disability     Balance Overall balance assessment: Needs assistance Sitting-balance support: Feet supported Sitting balance-Leahy Scale: Zero Sitting balance - Comments: maximal assist to maintain upright positioning at EOB, patient with some cervical control upon command Postural control: Posterior lean;Left lateral lean                                   Pertinent Vitals/Pain Pain Assessment: Faces Faces Pain Scale: Hurts little more Pain Location: head Pain Descriptors / Indicators: Headache Pain Intervention(s): Monitored during session;Repositioned;Relaxation    Home Living Family/patient expects to be discharged to:: Private residence Living Arrangements: Spouse/significant other Available Help at Discharge: Family Type of Home: House Home Access: Stairs to enter Entrance Stairs-Rails: None Secretary/administratorntrance Stairs-Number of Steps: 3 Home Layout: One level Home Equipment: None      Prior Function Level of Independence: Independent               Hand Dominance   Dominant Hand: Right    Extremity/Trunk Assessment   Upper Extremity Assessment: RUE deficits/detail;LUE deficits/detail       LUE Deficits / Details: Flaccide LUE. Reports diminished sesnation   Lower Extremity Assessment: Generalized weakness;LLE deficits/detail   LLE Deficits / Details: flaccid LLE, patient reports sensation inconsistently, question reliablity of response  Communication   Communication: Tracheostomy  Cognition Arousal/Alertness: Lethargic Behavior During Therapy: Flat affect Overall Cognitive Status: Difficult to assess Area of Impairment: Attention;Following commands;Awareness;Problem solving   Current Attention Level: Sustained   Following Commands: Follows one step commands consistently      Problem Solving: Requires verbal cues;Requires tactile cues General Comments: patient was able to communicate via writing on paper expressed desire for "cool wet rag !!" difficulty with spacing and legiblity of writing     General Comments      Exercises        Assessment/Plan    PT Assessment Patient needs continued PT services  PT Diagnosis Difficulty walking;Abnormality of gait;Generalized weakness;Acute pain;Hemiplegia non-dominant side   PT Problem List Decreased strength;Decreased activity tolerance;Decreased balance;Decreased mobility;Decreased coordination;Decreased cognition;Decreased safety awareness;Impaired sensation;Impaired tone;Obesity;Pain  PT Treatment Interventions DME instruction;Gait training;Functional mobility training;Therapeutic activities;Therapeutic exercise;Balance training;Neuromuscular re-education;Patient/family education;Manual techniques   PT Goals (Current goals can be found in the Care Plan section) Acute Rehab PT Goals Patient Stated Goal: none stated PT Goal Formulation: With patient/family Time For Goal Achievement: 09/07/15 Potential to Achieve Goals: Fair    Frequency Min 3X/week   Barriers to discharge        Co-evaluation               End of Session Equipment Utilized During Treatment: Oxygen (ATC) Activity Tolerance: Patient limited by fatigue Patient left: in bed;with call bell/phone within reach;with family/visitor present (in chair position) Nurse Communication: Mobility status;Need for lift equipment;Precautions         Time: 1610-9604 PT Time Calculation (min) (ACUTE ONLY): 28 min   Charges:   PT Evaluation $PT Eval High Complexity: 1 Procedure     PT G CodesFabio Asa 2015/09/14, 9:56 AM Charlotte Crumb, PT DPT  [phone]

## 2015-08-24 NOTE — Evaluation (Addendum)
Occupational Therapy Evaluation Patient Details Name: Brad Mcgee MRN: [id number] DOB: 03-18-1986 Today's Date: 08/24/2015    History of Present Illness 30 yo male with slurred speech and Lt sided weakness. This was associated with headache. He was intubated for airway protection. He had CT head which showed 3.7 x 1.9 x 2 cm Rt basal ganglia ICH which then increased to 4.5 x 1.9 x 3.4 cm ICH on f/u CT head. S/p trach on 08/23/15   Clinical Impression   Per family, pt was independent with ADLs and mobility PTA. Pt able to tolerate sitting EOB with max assist provided for sitting balance and participate in grooming activity. Pt able to swipe at face x 4 with washcloth using his R hand with max verbal cues for initiation. Pt alert; following one step commands consistently; and able to communicate with therapists via hand gestures, nods, and writing with pen and paper. Pt presenting with difficulty visually tracking to the L and decreased movement/strength of his L UE impacting his independence and safety with ADLs and functional mobility. Recommending CIR level therapies for follow up in order to maximize independence and safety with ADLs and functional mobility prior to return home. Pt very willing to participate in therapy and seems to have a supportive family. Pt would benefit from continued skilled OT to address established goals.    Follow Up Recommendations  CIR;Supervision/Assistance - 24 hour    Equipment Recommendations  Other (comment) (TBD at next venue)    Recommendations for Other Services Rehab consult     Precautions / Restrictions Precautions Precautions: Fall Precaution Comments: watch BP, and O2 saturations on ATC Restrictions Weight Bearing Restrictions: No      Mobility Bed Mobility Overal bed mobility: Needs Assistance;+2 for physical assistance;+ 2 for safety/equipment Bed Mobility: Supine to Sit;Sit to Supine     Supine to sit: Max assist;+2 for physical  assistance;+2 for safety/equipment;HOB elevated Sit to supine: Total assist;+2 for physical assistance   General bed mobility comments: Max assist of 2 persons to come to EOB, patient able to initiate RLE movement to EOB but required maximal assist for all other aspects of mobility, Total assist of 2 persons to return to supine and reposition  Transfers                 General transfer comment: mechanical use of Bed to position in chair position    Balance Overall balance assessment: Needs assistance Sitting-balance support: Feet supported Sitting balance-Leahy Scale: Zero Sitting balance - Comments: maximal assist to maintain upright positioning at EOB, patient with some cervical control upon command Postural control: Posterior lean;Left lateral lean                                  ADL Overall ADL's : Needs assistance/impaired Eating/Feeding: NPO   Grooming: Maximal assistance;Wash/dry face;Sitting Grooming Details (indicate cue type and reason): Pt able to swipe at mouth x 2 then wipe forehead x 2 with washcloth using R hand. Max verbal cues provided for initiation of grooming tasks.  Upper Body Bathing: Maximal assistance;Sitting   Lower Body Bathing: Total assistance   Upper Body Dressing : Maximal assistance;Sitting   Lower Body Dressing: Total assistance                 General ADL Comments: Pt able to write out that he wanted a "cold wet washcloth!!" with pen and paper. Pt responding appropriately to  questions with head nods, thumbs up, and hand gestures. VSS throughout.     Vision Vision Assessment?: Yes;Vision impaired- to be further tested in functional context Ocular Range of Motion: Restricted on the left Alignment/Gaze Preference: Chin down Additional Comments: Reports no diplopia. Bil eyes do not track past midline to L side.   Perception     Praxis      Pertinent Vitals/Pain Pain Assessment: Faces Faces Pain Scale: Hurts little  more Pain Location: head Pain Descriptors / Indicators: Headache Pain Intervention(s): Monitored during session;Repositioned;Relaxation     Hand Dominance Right   Extremity/Trunk Assessment Upper Extremity Assessment Upper Extremity Assessment: LUE deficits/detail LUE Deficits / Details: Flaccid LUE with no active movement. PROM WFL. Reports diminished sensation LUE Sensation: decreased light touch;decreased proprioception LUE Coordination: decreased fine motor;decreased gross motor   Lower Extremity Assessment Lower Extremity Assessment: Defer to PT evaluation       Communication Communication Communication: Tracheostomy   Cognition Arousal/Alertness: Lethargic Behavior During Therapy: Flat affect Overall Cognitive Status: Difficult to assess Area of Impairment: Attention;Following commands;Awareness;Problem solving   Current Attention Level: Sustained   Following Commands: Follows one step commands consistently     Problem Solving: Requires verbal cues;Requires tactile cues General Comments: patient was able to communicate via writing on paper expressed desire for "cool wet rag !!" difficulty with spacing and legiblity of writing    General Comments       Exercises       Shoulder Instructions      Home Living Family/patient expects to be discharged to:: Private residence Living Arrangements: Spouse/significant other Available Help at Discharge: Family Type of Home: House Home Access: Stairs to enter Secretary/administrator of Steps: 3 Entrance Stairs-Rails: None Home Layout: One level     Bathroom Shower/Tub: Tub/shower unit;Walk-in shower   Bathroom Toilet: Standard     Home Equipment: None   Additional Comments: Very supportive family. Father reports they can make home modifications as needed.      Prior Functioning/Environment Level of Independence: Independent             OT Diagnosis: Generalized weakness;Disturbance of vision;Acute  pain;Hemiplegia non-dominant side   OT Problem List: Decreased strength;Decreased range of motion;Decreased activity tolerance;Impaired balance (sitting and/or standing);Impaired vision/perception;Decreased coordination;Decreased safety awareness;Decreased knowledge of use of DME or AE;Decreased knowledge of precautions;Impaired sensation;Impaired tone;Obesity;Impaired UE functional use;Pain;Increased edema   OT Treatment/Interventions: Self-care/ADL training;Therapeutic exercise;Neuromuscular education;Energy conservation;DME and/or AE instruction;Therapeutic activities;Visual/perceptual remediation/compensation;Patient/family education;Balance training    OT Goals(Current goals can be found in the care plan section) Acute Rehab OT Goals Patient Stated Goal: none stated OT Goal Formulation: With patient Time For Goal Achievement: 09/07/15 Potential to Achieve Goals: Good ADL Goals Pt Will Perform Grooming: with min assist;sitting Pt Will Perform Upper Body Bathing: with mod assist;sitting Pt Will Perform Lower Body Bathing: with mod assist;sit to/from stand Pt Will Transfer to Toilet: with max assist;bedside commode;stand pivot transfer Additional ADL Goal #1: Pt will sit EOB for 5 minutes with min guard assist as precursor for ADLs. Additional ADL Goal #2: Pts family will independently demonstrate proper positioning of UEs with min verbal cues.  OT Frequency: Min 3X/week   Barriers to D/C:            Co-evaluation              End of Session Equipment Utilized During Treatment: Oxygen Nurse Communication: Mobility status  Activity Tolerance: Patient tolerated treatment well Patient left: in bed;with call bell/phone within reach;with bed alarm set;with  family/visitor present   Time: [id number] OT Time Calculation (min): 32 min Charges:  OT General Charges $OT Visit: 1 Procedure OT Evaluation $OT Eval High Complexity: 1 Procedure G-Codes:     Gaye AlkenBailey A Consuello Lassalle M.S.,  OTR/L Pager: [phone]  08/24/2015, 2:29 PM

## 2015-08-24 NOTE — Progress Notes (Signed)
Nutrition Follow-up  DOCUMENTATION CODES:   Morbid obesity  INTERVENTION:   Vital AF 1.2 @ 75 ml/hr Provides: 2160 kcal, 135 grams protein, and 1459 ml H2O.   NUTRITION DIAGNOSIS:   Inadequate oral intake related to inability to eat as evidenced by NPO status. Ongoing.   GOAL:   Patient will meet greater than or equal to 90% of their needs Progressing.   MONITOR:   TF tolerance, Labs, I & O's  ASSESSMENT:   30 yo male with slurred speech and Lt sided weakness. This was associated with headache. He was intubated for airway protection. He had CT head which showed 3.7 x 1.9 x 2 cm Rt basal ganglia ICH which then increased to 4.5 x 1.9 x 3.4 cm ICH on f/u CT head. His BP was 187/106.  6/7 trach and Cortrak (proximal duodenum) 6/8 transitioned to trach collar  Medications reviewed and include: MVI, KCl Labs reviewed: sodium elevated 151 but trending down, potassium low 3.3, magnesium elevated 2.5 CBG's: 118-150 Family at bedside.   Diet Order:  Diet NPO time specified  Skin:  Reviewed, no issues  Last BM:  6/8 100 ml, + x1, via rectal tube (inserted 6/6)  Height:   Ht Readings from Last 1 Encounters:  08/15/15 5\' 7"  (1.702 m)   Weight:   Wt Readings from Last 1 Encounters:  08/24/15 288 lb 12.8 oz (131 kg)   Ideal Body Weight:  67.2 kg  BMI:  Body mass index is 45.22 kg/(m^2).  Estimated Nutritional Needs:   Kcal:  2000-2200  Protein:  135-150 grams  Fluid:  > 2 L/day  EDUCATION NEEDS:   No education needs identified at this time  Kendell BaneHeather Evalena Fujii RD, LDN, CNSC [phone] Pager [phone] After Hours Pager

## 2015-08-24 NOTE — Consult Note (Signed)
Physical Medicine and Rehabilitation Consult Reason for Consult: Right basal ganglia ICH secondary to hypertensive crisis Referring Physician: Dr.Xu   HPI: Brad Mcgee is a 30 y.o. right handed male with history of hypertension. Presented 08/16/2015 with left-sided weakness and headache. Per chart review patient lives with spouse independent prior to admission. One level home with 3 steps to entry. Urine drug screen negative. Blood pressure 187/106. CT of the head showed acute right basal ganglia hemorrhage measuring 3.7 x 1.9 x 2.0 cm with surrounding edema and mass effect. No midline shift. Follow-up CT 08/16/2015 of the head showed increased size of hemorrhage with increased mass effect as well as 2 mm right-to-left midline shift and repeated 08/20/2015 again with right basal ganglia increased in size 5.4 x 1.9 cm.. Neurology consulted. Patient did require intubation for airway protection. Echocardiogram with ejection fraction of 55% grade 2 diastolic dysfunction. CTA of head and neck negative for stenosis or dissection. Patient with prolonged intubation and underwent tracheostomy placement #6 cuffed Shiley trach 08/23/2015 per Dr. Molli Knock. Subcutaneous Lovenox for DVT prophylaxis. Patient is nothing by mouth with alternative means of nutrition. Physical therapy evaluation completed 08/24/2015 with recommendations of physical medicine rehabilitation consult.   Review of Systems  Unable to perform ROS: mental acuity   Past Medical History  Diagnosis Date  . Hypertension    History reviewed. No pertinent past surgical history. Family History  Problem Relation Age of Onset  . Hypertension Mother    Social History:  reports that he has never smoked. He has never used smokeless tobacco. He reports that he does not drink alcohol or use illicit drugs. Allergies:  Allergies  Allergen Reactions  . Bee Venom Anaphylaxis  . Coconut Oil Anaphylaxis   Medications Prior to Admission    Medication Sig Dispense Refill  . lisinopril (PRINIVIL,ZESTRIL) 10 MG tablet Take 10 mg by mouth daily.    . naproxen (NAPROSYN) 500 MG tablet Take 1 tablet (500 mg total) by mouth 2 (two) times daily with a meal. 60 tablet 0    Home: Home Living Family/patient expects to be discharged to:: Private residence Living Arrangements: Spouse/significant other Available Help at Discharge: Family Type of Home: House Home Access: Stairs to enter Secretary/administrator of Steps: 3 Entrance Stairs-Rails: None Home Layout: One level Bathroom Shower/Tub: Tub/shower unit, Health visitor: Standard Home Equipment: None Additional Comments: Very supportive family. Father   Functional History: Prior Function Level of Independence: Independent Functional Status:  Mobility: Bed Mobility Overal bed mobility: Needs Assistance, +2 for physical assistance, + 2 for safety/equipment Bed Mobility: Supine to Sit, Sit to Supine Supine to sit: Max assist, +2 for physical assistance, +2 for safety/equipment, HOB elevated Sit to supine: Total assist, +2 for physical assistance General bed mobility comments: Max assist of 2 persons to come to EOB, patient able to initiate RLE movement to EOB but required maximal assist for all other aspects of mobility, Total assist of 2 persons to return to supine and reposition Transfers General transfer comment: mechanical use of Bed to position in chair position      ADL:    Cognition: Cognition Overall Cognitive Status: Difficult to assess Orientation Level: Intubated/Tracheostomy - Unable to assess Cognition Arousal/Alertness: Lethargic Behavior During Therapy: Flat affect Overall Cognitive Status: Difficult to assess Area of Impairment: Attention, Following commands, Awareness, Problem solving Current Attention Level: Sustained Following Commands: Follows one step commands consistently Problem Solving: Requires verbal cues, Requires tactile  cues General Comments: patient  was able to communicate via writing on paper expressed desire for "cool wet rag !!" difficulty with spacing and legiblity of writing  Difficult to assess due to: Impaired communication, Tracheostomy  Blood pressure 149/81, pulse 78, temperature 99.9 F (37.7 C), temperature source Rectal, resp. rate 26, height 5\' 7"  (1.702 m), weight 131 kg (288 lb 12.8 oz), SpO2 99 %. Physical Exam  Constitutional:  30 year old obese male  HENT:  Head: Normocephalic.  Nasogastric tube in place  Eyes:  Pupils sluggish to light  Neck:  Trach tube in place  Cardiovascular: Normal rate and regular rhythm.   Respiratory:  Decreased breath sounds at the bases with limited inspiratory effort  GI: Soft. Bowel sounds are normal. He exhibits no distension.  Neurological:  Patient is lethargic but will open eyes on verbal command. He will nod his head yes and no to some simple questions. He would hold his thumb up on command and squeezed my hand with right hand and wiggled toes on right. Sensed pain on left but no movement noted.   Psychiatric:  Flat/sedated    Results for orders placed or performed during the hospital encounter of 08/15/15 (from the past 24 hour(s))  Protime-INR     Status: None   Collection Time: 08/23/15 11:00 AM  Result Value Ref Range   Prothrombin Time 15.2 11.6 - 15.2 seconds   INR 1.18 0.00 - 1.49  Glucose, capillary     Status: Abnormal   Collection Time: 08/23/15  3:38 PM  Result Value Ref Range   Glucose-Capillary 128 (H) 65 - 99 mg/dL  Basic metabolic panel     Status: Abnormal   Collection Time: 08/23/15  5:30 PM  Result Value Ref Range   Sodium 153 (H) 135 - 145 mmol/L   Potassium 3.2 (L) 3.5 - 5.1 mmol/L   Chloride 120 (H) 101 - 111 mmol/L   CO2 28 22 - 32 mmol/L   Glucose, Bld 128 (H) 65 - 99 mg/dL   BUN 38 (H) 6 - 20 mg/dL   Creatinine, Ser 1.611.13 0.61 - 1.24 mg/dL   Calcium 9.1 8.9 - 09.610.3 mg/dL   GFR calc non Af Amer >60 >60 mL/min     GFR calc Af Amer >60 >60 mL/min   Anion gap 5 5 - 15  Glucose, capillary     Status: Abnormal   Collection Time: 08/23/15  7:23 PM  Result Value Ref Range   Glucose-Capillary 105 (H) 65 - 99 mg/dL  Glucose, capillary     Status: Abnormal   Collection Time: 08/23/15 11:28 PM  Result Value Ref Range   Glucose-Capillary 118 (H) 65 - 99 mg/dL  Glucose, capillary     Status: Abnormal   Collection Time: 08/24/15  3:27 AM  Result Value Ref Range   Glucose-Capillary 150 (H) 65 - 99 mg/dL  Basic metabolic panel     Status: Abnormal   Collection Time: 08/24/15  3:50 AM  Result Value Ref Range   Sodium 151 (H) 135 - 145 mmol/L   Potassium 3.3 (L) 3.5 - 5.1 mmol/L   Chloride 117 (H) 101 - 111 mmol/L   CO2 29 22 - 32 mmol/L   Glucose, Bld 151 (H) 65 - 99 mg/dL   BUN 36 (H) 6 - 20 mg/dL   Creatinine, Ser 0.450.93 0.61 - 1.24 mg/dL   Calcium 8.8 (L) 8.9 - 10.3 mg/dL   GFR calc non Af Amer >60 >60 mL/min   GFR calc Af Amer >60 >60  mL/min   Anion gap 5 5 - 15  Magnesium     Status: Abnormal   Collection Time: 08/24/15  3:50 AM  Result Value Ref Range   Magnesium 2.5 (H) 1.7 - 2.4 mg/dL  Phosphorus     Status: None   Collection Time: 08/24/15  3:50 AM  Result Value Ref Range   Phosphorus 3.9 2.5 - 4.6 mg/dL  Glucose, capillary     Status: Abnormal   Collection Time: 08/24/15  8:01 AM  Result Value Ref Range   Glucose-Capillary 136 (H) 65 - 99 mg/dL   Dg Chest Port 1 View  08/24/2015  CLINICAL DATA:  Acute respiratory failure, intracranial hemorrhage EXAM: PORTABLE CHEST 1 VIEW COMPARISON:  Portable chest x-ray of August 23, 2015 FINDINGS: The cardiopericardial silhouette remains enlarged. The retrocardiac density has decreased somewhat. The interstitial markings remain prominent in the right lung. The tracheostomy appliance tip lies at the level of the clavicular heads. The feeding tube tip projects below the inferior margin of the image. The right internal jugular venous catheter tip projects  over the midportion of the SVC. IMPRESSION: Slight interval improvement in left lower lobe atelectasis or pneumonia. Stable cardiomegaly and stable mild interstitial edema. The support tubes are in reasonable position. Electronically Signed   By: David  [place] M.D.   On: 08/24/2015 08:09   Dg Chest Port 1 View  08/23/2015  CLINICAL DATA:  30 year old male status post tracheostomy placement. EXAM: PORTABLE CHEST 1 VIEW COMPARISON:  Prior chest x-ray obtained earlier today at 6:09 a.m. how FINDINGS: Interval placement of a tracheostomy tube. The tube tip is midline and at the level of the clavicles. There is no evidence of pneumothorax. The gastric tube has been removed and replaced with an enteric feeding tube. The tip of the enteric feeding tube is not visualized but lies below the diaphragm likely within the stomach or small bowel. Right IJ approach central venous catheter remains in unchanged position with the tip overlying the superior cavoatrial junction. Stable cardiomegaly, pulmonary vascular congestion and bibasilar atelectasis. IMPRESSION: 1. Interval placement of a tracheostomy tube without evidence of immediate complication. 2. Stable cardiomegaly, pulmonary vascular congestion, low inspiratory volumes and bibasilar atelectasis. 3. Interval removal of gastric tube and placement of an enteric feeding tube. Electronically Signed   By: Malachy Moan M.D.   On: 08/23/2015 16:54   Dg Chest Port 1 View  08/23/2015  CLINICAL DATA:  Acute respiratory failure acute EXAM: PORTABLE CHEST 1 VIEW COMPARISON:  08/21/2015 FINDINGS: Cardiomegaly again noted. Central mild vascular congestion without convincing pulmonary edema. Persistent left basilar atelectasis. Stable endotracheal and NG tube position. Stable right IJ central line position. IMPRESSION: Stable support apparatus. Cardiomegaly. Central mild vascular congestion without pulmonary edema. Left basilar atelectasis. Electronically Signed   By: Natasha Mead  M.D.   On: 08/23/2015 07:59   Dg Abd Portable 1v  08/23/2015  CLINICAL DATA:  Feeding tube placement EXAM: PORTABLE ABDOMEN - 1 VIEW COMPARISON:  08/16/2015 FINDINGS: There is normal small bowel gas pattern. There is NG feeding tube probable within proximal duodenum. Please note the tip of the tube is not included on the film. IMPRESSION: NG tube probable within proximal duodenum. Please note the tip of the feeding tube is not included on the film. Electronically Signed   By: Natasha Mead M.D.   On: 08/23/2015 17:01    Assessment/Plan: Diagnosis: Right BG ICH with left hemiparesis 1. Does the need for close, 24 hr/day medical supervision in concert with  the patient's rehab needs make it unreasonable for this patient to be served in a less intensive setting? Yes 2. Co-Morbidities requiring supervision/potential complications: htn, trach, dysphagia 3. Due to bladder management, bowel management, safety, skin/wound care, disease management, medication administration, pain management and patient education, does the patient require 24 hr/day rehab nursing? Yes 4. Does the patient require coordinated care of a physician, rehab nurse, PT (1-2 hrs/day, 5 days/week), OT (1-2 hrs/day, 5 days/week) and SLP (1-2 hrs/day, 5 days/week) to address physical and functional deficits in the context of the above medical diagnosis(es)? Yes Addressing deficits in the following areas: balance, endurance, locomotion, strength, transferring, bowel/bladder control, bathing, dressing, feeding, grooming, toileting, cognition, speech, swallowing and psychosocial support 5. Can the patient actively participate in an intensive therapy program of at least 3 hrs of therapy per day at least 5 days per week? Yes and Potentially 6. The potential for patient to make measurable gains while on inpatient rehab is excellent 7. Anticipated functional outcomes upon discharge from inpatient rehab are supervision and min assist  with PT,  supervision and min assist with OT, modified independent and supervision with SLP. 8. Estimated rehab length of stay to reach the above functional goals is: 18-24 days 9. Does the patient have adequate social supports and living environment to accommodate these discharge functional goals? Yes and Potentially 10. Anticipated D/C setting: Home 11. Anticipated post D/C treatments: HH therapy and Outpatient therapy 12. Overall Rehab/Functional Prognosis: excellent  RECOMMENDATIONS: This patient's condition is appropriate for continued rehabilitative care in the following setting: CIR Patient has agreed to participate in recommended program. Potentially Note that insurance prior authorization may be required for reimbursement for recommended care.  Comment: Rehab Admissions Coordinator to follow up.  Thanks,  Ranelle Oyster, MD, [place]     08/24/2015

## 2015-08-24 NOTE — Progress Notes (Signed)
I will contact family to begin discussions on an inpt rehab admission.[phone]

## 2015-08-25 DIAGNOSIS — I69359 Hemiplegia and hemiparesis following cerebral infarction affecting unspecified side: Secondary | ICD-10-CM | POA: Diagnosis present

## 2015-08-25 DIAGNOSIS — I610 Nontraumatic intracerebral hemorrhage in hemisphere, subcortical: Secondary | ICD-10-CM | POA: Diagnosis present

## 2015-08-25 DIAGNOSIS — Z8673 Personal history of transient ischemic attack (TIA), and cerebral infarction without residual deficits: Secondary | ICD-10-CM | POA: Diagnosis present

## 2015-08-25 LAB — BASIC METABOLIC PANEL
Anion gap: 9 (ref 5–15)
BUN: 38 mg/dL — ABNORMAL HIGH (ref 6–20)
CO2: 26 mmol/L (ref 22–32)
Calcium: 8.9 mg/dL (ref 8.9–10.3)
Chloride: 115 mmol/L — ABNORMAL HIGH (ref 101–111)
Creatinine, Ser: 1.04 mg/dL (ref 0.61–1.24)
GFR calc Af Amer: >60 mL/min (ref >60)
GFR calc non Af Amer: >60 mL/min (ref >60)
Glucose, Bld: 156 mg/dL — ABNORMAL HIGH (ref 65–99)
Potassium: 3.2 mmol/L — ABNORMAL LOW (ref 3.5–5.1)
Sodium: 150 mmol/L — ABNORMAL HIGH (ref 135–145)

## 2015-08-25 LAB — GLUCOSE, CAPILLARY
Glucose-Capillary: 133 mg/dL — ABNORMAL HIGH (ref 65–99)
Glucose-Capillary: 134 mg/dL — ABNORMAL HIGH (ref 65–99)
Glucose-Capillary: 156 mg/dL — ABNORMAL HIGH (ref 65–99)
Glucose-Capillary: 157 mg/dL — ABNORMAL HIGH (ref 65–99)
Glucose-Capillary: 166 mg/dL — ABNORMAL HIGH (ref 65–99)
Glucose-Capillary: 183 mg/dL — ABNORMAL HIGH (ref 65–99)

## 2015-08-25 LAB — C DIFFICILE QUICK SCREEN W PCR REFLEX
C Diff antigen: NEGATIVE
C Diff interpretation: NEGATIVE
C Diff toxin: NEGATIVE

## 2015-08-25 LAB — MAGNESIUM: Magnesium: 2.4 mg/dL (ref 1.7–2.4)

## 2015-08-25 MED ORDER — CETYLPYRIDINIUM CHLORIDE 0.05 % MT LIQD
7.0000 mL | Freq: Two times a day (BID) | OROMUCOSAL | Status: DC
  Administered 2015-08-25 – 2015-08-27 (×5): 7 mL via OROMUCOSAL

## 2015-08-25 MED ORDER — CHLORHEXIDINE GLUCONATE 0.12 % MT SOLN
OROMUCOSAL | Status: AC
  Administered 2015-08-25: 15 mL
  Filled 2015-08-25: qty 15

## 2015-08-25 MED ORDER — POTASSIUM CHLORIDE 20 MEQ/15ML (10%) PO SOLN
30.0000 meq | ORAL | Status: AC
  Administered 2015-08-25 (×2): 30 meq
  Filled 2015-08-25 (×2): qty 30

## 2015-08-25 NOTE — Progress Notes (Signed)
[facility] Nursing ICU Electrolyte Replacement Protocol  Patient Name: Brad Mcgee DOB: 07/22/1985 MRN: [id number]  Date of Service  08/25/2015   HPI/Events of Note    Recent Labs Lab 08/19/15 0525  08/20/15 0548  08/21/15 0555  08/22/15 0500 08/22/15 0526  08/23/15 0041 08/23/15 0515 08/23/15 1730 08/24/15 0350 08/25/15 0256  NA 153*  < > 152*  151*  < > 151*  < > 152* 153*  < > 156* 156* 153* 151* 150*  K 3.6  < > 4.1  --  4.0  --  3.7  --   --   --  3.6 3.2* 3.3* 3.2*  CL 119*  < > 116*  --  117*  --  119*  --   --   --  122* 120* 117* 115*  CO2 26  < > 27  --  27  --  27  --   --   --  28 28 29 26   GLUCOSE 127*  < > 117*  --  133*  --  135*  --   --   --  150* 128* 151* 156*  BUN 18  < > 22*  --  22*  --  33*  --   --   --  38* 38* 36* 38*  CREATININE 1.08  < > 1.06  --  0.92  --  1.00  --   --   --  1.15 1.13 0.93 1.04  CALCIUM 9.0  < > 8.7*  --  8.5*  --  8.5*  --   --   --  8.9 9.1 8.8* 8.9  MG 1.7  --  1.9  --  2.4  --   --  2.4  --   --   --   --  2.5* 2.4  PHOS 3.6  --  4.7*  --  3.9  --  3.4  --   --   --   --   --  3.9  --   < > = values in this interval not displayed.  Estimated Creatinine Clearance: 134.1 mL/min (by C-G formula based on Cr of 1.04).  Intake/Output      06/08 0701 - 06/09 0700   I.V. (mL/kg) 190 (1.5)   NG/GT 1050   IV Piggyback 200   Total Intake(mL/kg) 1440 (11)   Urine (mL/kg/hr) 1000 (0.3)   Stool 100 (0)   Total Output 1100   Net +340       Urine Occurrence 2 x    - I/O DETAILED x24h    Total I/O In: 710 [I.V.:80; NG/GT:630] Out: -  - I/O THIS SHIFT    ASSESSMENT   eICURN Interventions  K+ replaced using electrolyte protocol   ASSESSMENT: MAJOR ELECTROLYTE    Merita NortonFrye, Hanni Milford Nicole 08/25/2015, 6:00 AM

## 2015-08-25 NOTE — Progress Notes (Signed)
PULMONARY / CRITICAL CARE MEDICINE   Name: Brad Mcgee MRN: [id number] DOB: 02/17/1986    ADMISSION DATE:  08/15/2015 CONSULTATION DATE:  08/16/2015  REFERRING MD:  Dr. Amada JupiterKirkpatrick  CHIEF COMPLAINT:  Left side weakness  HISTORY OF PRESENT ILLNESS:  Hx from chart. 30 yo male with slurred speech and Lt sided weakness.  This was associated with headache.  He was intubated for airway protection.  He had CT head which showed 3.7 x 1.9 x 2 cm Rt basal ganglia ICH which then increased to 4.5 x 1.9 x 3.4 cm ICH on f/u CT head.  His BP was 187/106.  SUBJECTIVE:  Awake NAD Tolerating ATC this am    VITAL SIGNS: BP 127/72 mmHg  Pulse 86  Temp(Src) 99.9 F (37.7 C) (Rectal)  Resp 28  Ht 5\' 7"  (1.702 m)  Wt 283 lb 11.7 oz (128.7 kg)  BMI 44.43 kg/m2  SpO2 98%  HEMODYNAMICS:    VENTILATOR SETTINGS: Vent Mode:  [-]  FiO2 (%):  [28 %-40 %] 28 %  INTAKE / OUTPUT: I/O last 3 completed shifts: In: 2515 [I.V.:360; NG/GT:1755; IV Piggyback:400] Out: 2890 [Urine:2590; Stool:300]  PHYSICAL EXAMINATION: General:  Obese. No distress. Neuro:  Pupils symmetric. Moves R arm spont and to command.  Opens eyes with stimulation. Followscommands. L hemiplegia. Aware and alert HEENT: Trach in place. No scleral icterus or injection. Copious secretions, white Cardiovascular:  Regular rate & rhythm. No appreciable JVD given body habitus. Lungs:  Distant breath sounds. Symmetric chest rise on vent. Diminshed in bases Abdomen:  Soft. Protuberant. Normal bowel sounds. Integument:  Warm & dry. No rash on exposed skin.  LABS:  BMET  Recent Labs Lab 08/23/15 1730 08/24/15 0350 08/25/15 0256  NA 153* 151* 150*  K 3.2* 3.3* 3.2*  CL 120* 117* 115*  CO2 28 29 26   BUN 38* 36* 38*  CREATININE 1.13 0.93 1.04  GLUCOSE 128* 151* 156*    Electrolytes  Recent Labs Lab 08/21/15 0555 08/22/15 0500 08/22/15 0526  08/23/15 1730 08/24/15 0350 08/25/15 0256  CALCIUM 8.5* 8.5*  --   < > 9.1 8.8*  8.9  MG 2.4  --  2.4  --   --  2.5* 2.4  PHOS 3.9 3.4  --   --   --  3.9  --   < > = values in this interval not displayed.  CBC  Recent Labs Lab 08/20/15 0548 08/21/15 0555 08/22/15 0526  WBC 10.2 10.8* 10.6*  HGB 12.3* 13.1 11.8*  HCT 39.8 42.5 39.0  PLT 211 220 221    Coag's  Recent Labs Lab 08/23/15 1100  INR 1.18    Sepsis Markers  Recent Labs Lab 08/18/15 1432 08/19/15 0525 08/20/15 0548  PROCALCITON 0.10 <0.10 0.24    ABG No results for input(s): PHART, PCO2ART, PO2ART in the last 168 hours.  Liver Enzymes  Recent Labs Lab 08/20/15 0548 08/21/15 0555 08/22/15 0500  ALBUMIN 2.6* 2.9* 2.5*    Cardiac Enzymes No results for input(s): TROPONINI, PROBNP in the last 168 hours.  Glucose  Recent Labs Lab 08/24/15 0801 08/24/15 1218 08/24/15 1551 08/24/15 2010 08/24/15 2354 08/25/15 0438  GLUCAP 136* 126* 123* 138* 150* 166*    Imaging No results found.  STUDIES:  5/30 CT Head: 3.7 x 1.9 x 2 cm Rt basal ganglia ICH 5/31 CT Head:  No significant change. 5.2x1.9x3.0cm right basal ganglia hematoma. Mild mass effect. 5/31 TTE: Severe concentric LVH with EF 50-55%. No regional wall motion abnormalities.  Grade 2 diastolic dysfunction. LA & RA normal in size. RV normal in size and function. No aortic stenosis or regurgitation. Aortic root normal in size. Trivial mitral regurgitation without stenosis. No pulmonic regurgitation or stenosis. No tricuspid regurgitation. No pericardial effusion. 6/01 Port CXR:  Increased bilateral interstitial markings. Unable to appreciate pleural effusion. ETT & CVL in stable position.  MICROBIOLOGY: MRSA PCR 5/31:  Negative  Sputum 6/2 GPC>>MSSA Blood 6/2>>  ANTIBIOTICS: 6/3 vanc>>6/4 6/3 zoysn>> 6/4 6/4 cefazolin >>    SIGNIFICANT EVENTS: 5/30 Admit 5/31 Start 3% NS>> 6/7   LINES/TUBES: OETT 7.5 5/31 >> 6/7 R IJ CVL 5/31 >> OGT 5/31 >> FOLEY 5/31 >> PIV x3  ASSESSMENT / PLAN:  NEUROLOGIC A:   R  Basal Ganglia ICH / CVA Sedation on Ventilator > tolerating ATC 6/9  P:   Management per Neurology RASS goal: 0 Fentanyl IV prn 3% HS Saline now off  PULMONARY A: VDRF Compromised Airway - Secondary to ICH.  Acute on chronic Diastolic CHF & 3% NaCl High risk for OSA  P:   ATC as tolerated, trach open at night; may need MV at night short term given his presumed OSA Off 3% saline Would need CPAP if / when decannulated Possibly to SDU either 6/9/or 6/10 if he stays off MV  CARDIOVASCULAR A:  Hypertensive Emergency Acute on Chronic Diastolic CHF - Likely secondary to 3% HS. Elevated Troponin I - Likely demand ischemia. No wall motion abnormalities on TTE. Grade 2 Diastolic CHF   P:  Vitals per Unit Protocol Monitoring on telemetry Cardene to keep SBP < 160, now weaned to off Labetalol IV prn Lisinopril  QD, metoprolol 25 bid, Maxide-37.5 / 25 QD Holding on Lasix for now  RENAL  Recent Labs Lab 08/23/15 1730 08/24/15 0350 08/25/15 0256  NA 153* 151* 150*    A:   Hypernatremia - Medically induced. Hyperchloremia - Medically induced.  P:   Goal Na 150 - 155 per Neuro Trending electrolytes & renal function daily Following UOP with Foley  GASTROINTESTINAL A:   Nutrition.  P:   Continuing Tube feeds, assess for cuff down w SLP, possibly diet Pepcid VT qhs  HEMATOLOGIC Wbc 11.2->13.1 A:   Anemia - Mild. Leukocytosis - Mild.  P:  Trending cell counts w/ CBC SCDs for prophylaxis On enoxaparin for DVT prophylaxis given stability of Head Ct  INFECTIOUS A:   FUO - possibly secondary to ICH.  MSSA bronchitis vs PNA P:   Follow Blood, Urine, & Tracheal Aspirate Cultures PCT 0.10 > 0.24 empiric abx narrowed to cefazolin on 6/4; 6/8 =  day #8 of 10 abx  ENDOCRINE CBG (last 3)   Recent Labs  08/24/15 2010 08/24/15 2354 08/25/15 0438  GLUCAP 138* 150* 166*   A:   No acute issues.    P:   Monitor blood sugar on daily labs.  FAMILY  UPDATES:  No family at bedside 6/5.  TODAY'S SUMMARY:  30 yo male with headache, slurred speech and Lt side weakness from Rt basal ganglia ICH and diastolic CHF exacerbation. On cefazolin for MSSA in sputum. Trach placed on 6/8, now on ATC. May be SDU candidate. Very awake and interactive. Secretions are an issue.    Brett Canales Minor ACNP [facility] P[facility] Pager [phone] till 3 pm If no answer page [phone] 08/25/2015, 9:09 AM  Attending Note:  I have examined patient, reviewed labs, studies and notes. I have discussed the case with S Minor, and I agree with the  data and plans as amended above. Possibly to SDu soon if he can stay off MV. May need nocturnal support, would like to try him first with just ATC while sleeping.   Levy Pupa, MD, PhD 08/25/2015, 1:04 PM [facility] [phone] or if no answer [phone]

## 2015-08-25 NOTE — Progress Notes (Signed)
I met with pt and his Mom at bedside. We discussed an inpt rehab admission when pt medically ready pending insurance approval. She is in agreement. I will follow up on Monday. [phone]

## 2015-08-25 NOTE — Evaluation (Signed)
[facility]y-[facility] Speaking Valve - Evaluation Patient Details  Name: Brad Mcgee MRN: [id number] Date of Birth: 03/16/1986  Today's Date: 08/25/2015 Time: 0932-1000 SLP Time Calculation (min) (ACUTE ONLY): 28 min  Past Medical History:  Past Medical History  Diagnosis Date  . Hypertension    Past Surgical History: History reviewed. No pertinent past surgical history. HPI:  30 yo male with slurred speech and Lt sided weakness and headache. Intubated 5/30, trach 6/7. CT head which showed 3.7 x 1.9 x 2 cm Rt basal ganglia ICH which then increased to 4.5 x 1.9 x 3.4 cm ICH on f/u CT head.    Assessment / Plan / Recommendation Clinical Impression  Reflexive cough strong however unable to adequately clear secretions and RN deep suctioned. Pt communicative in phrases requiring mod-max verbal prompts for deep inhalation to increase intensity and slow rate of speech. HR, SpO2 within normal range. RR 28-45 (briefly- 28-30 majority). Valve pushed off hub with exhalation x 1 and remained in place up to 4 minutes without evidence of back pressure. SLP retrieved parents from waiting room to hear pt speak and educate re: valve. Recommend pt wear with SLP and nurses only, deflate cuff and full supervision. Once pt able to tolerate valve for longer periods of time, will initiate swallow assessment- prognosis good.      SLP Assessment  Patient needs continued Speech Lanaguage Pathology Services    Follow Up Recommendations  Inpatient Rehab    Frequency and Duration min 2x/week  2 weeks    PMSV Trial PMSV was placed for: up to 4 minutes Able to redirect subglottic air through upper airway: Yes Able to Attain Phonation: Yes Voice Quality: Low vocal intensity Able to Expectorate Secretions: Yes Level of Secretion Expectoration with PMSV: Tracheal;Oral Breath Support for Phonation: Mildly decreased Intelligibility: Intelligibility reduced Word: 75-100% accurate Phrase: 50-74% accurate Respirations During  Trial:  (28-45 (briefly)) SpO2 During Trial:  (99-100) Pulse During Trial: 88 Behavior: Lethargic;Cooperative;Controlled;Responsive to questions   Tracheostomy Tube       Vent Dependency  FiO2 (%): 28 %    Cuff Deflation Trial  GO Tolerated Cuff Deflation: Yes Length of Time for Cuff Deflation Trial: 30 Behavior: Controlled;Cooperative        Royce MacadamiaLitaker, Lyndsee Casa Willis 08/25/2015, 10:38 AM   Breck CoonsLisa Willis Lonell FaceLitaker M.Ed [employer]SLP Pager [phone]

## 2015-08-25 NOTE — Progress Notes (Signed)
STROKE TEAM PROGRESS NOTE   SUBJECTIVE (INTERVAL HISTORY) Family members at the bedside. Patient is doing better, more responsive, he had a headache this morning that has resolved. They had no questions for me, had many discussions with Dr. Roda Shutters this week.    OBJECTIVE Temp:  [99.8 F (37.7 C)-100.9 F (38.3 C)] 99.9 F (37.7 C) (06/09 0800) Pulse Rate:  [78-96] 86 (06/09 0804) Cardiac Rhythm:  [-] Normal sinus rhythm (06/09 0800) Resp:  [20-37] 28 (06/09 0804) BP: (123-170)/(61-137) 127/72 mmHg (06/09 0804) SpO2:  [97 %-100 %] 100 % (06/09 0900) FiO2 (%):  [28 %-40 %] 28 % (06/09 0804) Weight:  [128.7 kg (283 lb 11.7 oz)] 128.7 kg (283 lb 11.7 oz) (06/09 0600)  CBC:   Recent Labs Lab 08/21/15 0555 08/22/15 0526  WBC 10.8* 10.6*  HGB 13.1 11.8*  HCT 42.5 39.0  MCV 100.5* 101.0*  PLT 220 221    Basic Metabolic Panel:  Recent Labs Lab 08/22/15 0500  08/24/15 0350 08/25/15 0256  NA 152*  < > 151* 150*  K 3.7  < > 3.3* 3.2*  CL 119*  < > 117* 115*  CO2 27  < > 29 26  GLUCOSE 135*  < > 151* 156*  BUN 33*  < > 36* 38*  CREATININE 1.00  < > 0.93 1.04  CALCIUM 8.5*  < > 8.8* 8.9  MG  --   < > 2.5* 2.4  PHOS 3.4  --  3.9  --   < > = values in this interval not displayed.  Lipid Panel:     Component Value Date/Time   TRIG 331* 08/22/2015 0526   HgbA1c: No results found for: HGBA1C Urine Drug Screen:     Component Value Date/Time   LABOPIA NONE DETECTED 08/16/2015 0033   COCAINSCRNUR NONE DETECTED 08/16/2015 0033   LABBENZ NONE DETECTED 08/16/2015 0033   AMPHETMU NONE DETECTED 08/16/2015 0033   THCU NONE DETECTED 08/16/2015 0033   LABBARB NONE DETECTED 08/16/2015 0033     IMAGING  Ct Head Wo Contrast 08/20/2015  ADDENDUM: There is herniation of the cerebellar tonsils through the foramen magnum. This is stable when compared to previous studies and likely reflects Chiari malformation although herniation from mass-effect cannot be completely excluded. 08/20/2015   Stable right basal ganglia hemorrhage with mass-effect and slight midline shift.  08/16/2015   No significant change since the previous study. Right basal ganglia hematoma volume 15.4 cc. Slightly better defined surrounding edema.  08/16/2015   Increased size of acute right basal ganglia hemorrhage, with increased mass effect. There is now 2 mm right to left midline shift. There is developing supratentorial sulcal effacement and persistent basilar cistern effacement concerning for developing cerebral edema.  08/15/2015  Acute right basal ganglia hemorrhage measuring 3.7 x 1.9 x 2.0 cm. Mild surrounding edema and mass effect, no midline shift. There is crowding of the basilar cisterns.   Ct Angio Head & Neck W/cm &/or Wo/cm 08/18/2015  1. Negative CTA of the head and neck. Please note that evaluation is somewhat limited by body habitus and timing of the contrast bolus. 2. Slight interval increase in size of acute right basal ganglia parenchymal hemorrhage, now measuring approximately 16 cc in estimated volume, previously 15.4 cc. Slightly increased and more defined surrounding vasogenic edema. Similar 2 mm right-to-left shift. Supratentorial sulcal effacement concerning for cerebral edema, similar to prior.   Dg Chest Port 1 View 08/24/2015 Slight interval improvement in left lower lobe atelectasis or pneumonia. Stable  cardiomegaly and stable mild interstitial edema. The support tubes are in reasonable position.  08/23/2015 Stable support apparatus. Cardiomegaly. Central mild vascular congestion without pulmonary edema. Left basilar atelectasis. 08/21/2015   Cardiomegaly with vascular congestion. Improving interstitial edema.  08/20/2015  Mild interstitial edema/CHF.  08/17/2015   1. Lines and tubes in stable position . 2. Cardiomegaly with increased interstitial markings consistent with congestive heart failure. Small left pleural effusion cannot be excluded.  08/16/2015  1. Tip of the right central line in the mid  SVC.  No pneumothorax. 2. Endotracheal and enteric tubes remain in place. 3. Unchanged enlargement of the cardiac silhouette. Decreased vascular congestion and edema from prior.  08/16/2015   1. Endotracheal tube seen ending 2-3 cm above the carina. 2. Lungs mildly hypoexpanded. Vascular congestion noted. Increased interstitial markings raise concern for mild pulmonary edema, though pneumonia could have a similar appearance.   Dg Abd Portable 1v 08/16/2015  No acute findings per Enteric tube with tip in the right upper quadrant likely over the distal stomach or proximal duodenum.   2D Echocardiogram  Left ventricle: The cavity size was normal. There was severe concentric hypertrophy. Systolic function was normal. The estimated ejection fraction was in the range of 50% to 55%. Wall motion was normal; there were no regional wall motion abnormalities. Features are consistent with a pseudonormal left ventricular filling pattern, with concomitant abnormal relaxation and increased filling pressure (grade 2 diastolic dysfunction). Doppler parameters are consistent with high ventricular filling pressure.   PHYSICAL EXAM; Exam is stable today, no changes GENERAL- trach in place, on vent, off sedation, appears as stated age, not in acute distress, Obese. HEENT- Atraumatic, normocephalic, Pupils reactive and equal CARDIAC- regular rate and rhythm, no murmurs, rubs or gallops. NEUROLOGICAL EXAM : trach in place, on vent, off sedation, eye open, able to follow simple commands on the right side. Nods yes/no. Eyes open able to communicate using right hand, Pupils 2 mm reactive. Fundi not visualized. Eyes mid position, slightly dysconjugate gaze with mild exotropia left eye and difficulty with crossing the midline on the left. Corneal reflex intact but more brisk on the right, left facial droop, tongue in mid position, LLE and LUE 0/5 and no spontaneous movement, RUE and RLE 4/5 with spontaneous movement. Sensation,  coordination and gait not tested.   ASSESSMENT/PLAN [name] is a 30 y.o. male with history of hypertension presenting with L sided weakness. CT showed a L basal ganglia hemorrhage, with change in mental status, pt had repeat Ct head which showed mildly increased size of hmg, and increased mass effect. Pt and was subsequently intubated with difficulty weaning, s/p trach.  Stroke:  Right BG ICH hemorrhage secondary to hypertension   Resultant  L hemiplegia, VDRF  Initial CT R BG hemorrhage 3.7x1.9x2, mild edema & mass effect  Repeat CT with neuro worsening increased size of R BG hmg w/ increased mass effect and cerebral edema.   Repeat CT same.  Stable hematoma. Unchanged shift or edema.  CTA head & neck unremarkable  2D Echo  EF 50-55%. No source of embolus   SCDs and lovenox for VTE prophylaxis  Diet NPO time specified, on tube feeds.  No antithrombotic prior to admission, not on antithrombotics due to ICH  Ongoing aggressive stroke risk factor management  Therapy recommendations:  CIR. Admissions coordinator following  Disposition:  pending   Acute respiratory failure MSSA bronchitis vs PNA  Intubated in the ED  CCM managing vent, Off propofol and fentanyl  this am  MSSA in sputum. abx narrowed from zosyn to ancef  Did not tolerate wean  Trach placement by CCM 08/23/2015  If unable to wean, ? Consider LTACH  Dysphagia  Diet NPO time specified   On tube feedings  Consider PEG if unable to wean patient, will need PEG if LTACH is needed  Medically induced hypernatremia for Cerebral Edema control  3% off for several days  NA slowing drifting down, 150 today  Rash - allergic  Discussed with inpt pharmacy and ancef was considered as most likely culprit, metoprolol less likely. Pt was on lisinopril at home  D/c ancef since Abx treatment has been 6 days now  WBC normalized   If pt continue to have fever, will consider Abx in different category by  CCM  Continue lisinopril and metoprolol  Hypertensive Emergency  BP 182/124 on arrival in setting of acute neurologic symptoms  BP goal < 160   Treated with cardene, now off  On metoprolol, lisinopril, triamterene-HCTZ 37.2/25   Other Stroke Risk Factors  Morbid Obesity, Body mass index is 44.43 kg/(m^2).   Acute on chronic diastolic CHF, grade 2 chronic CHF  High risk OSA. OP eval recommended  Other Active Problems  Anemia, mild  Leukocytosis, mild  Fever of unknown origin, ? Related to ICH  Hypokalemia, replaced  Hospital day # 9    Personally examined patient and images, and have participated in and made any corrections needed to history, physical, neuro exam,assessment and plan as stated above.  I have personally obtained the history, evaluated lab date, reviewed imaging studies and agree with radiology interpretations.    Naomie DeanAntonia Loza Prell, MD Stroke Neurology [phone] [facility]

## 2015-08-25 NOTE — Progress Notes (Signed)
Physical Therapy Treatment Patient Details Name: Brad Mcgee MRN: [id number] DOB: 10/19/1985 Today's Date: 08/25/2015    History of Present Illness 30 yo male with slurred speech and Lt sided weakness. This was associated with headache. He was intubated for airway protection. He had CT head which showed 3.7 x 1.9 x 2 cm Rt basal ganglia ICH which then increased to 4.5 x 1.9 x 3.4 cm ICH on f/u CT head. S/p trach on 08/23/15    PT Comments    Patient seen for therapy progression. Session focused on EOB activity and trunk control. Patient tolerated treatment well but overall limited by fatigue. Multi-modal cues to engage. Patient Continues to demonstrate AROM RUE and LE, but no motion noted in LLE, trace in LUE. At this time, will continue to see and progress as tolerated.   Follow Up Recommendations  CIR;Supervision/Assistance - 24 hour     Equipment Recommendations  Other (comment) (TBD)    Recommendations for Other Services Rehab consult     Precautions / Restrictions Precautions Precautions: Fall Precaution Comments: watch BP, and O2 saturations on ATC Restrictions Weight Bearing Restrictions: No    Mobility  Bed Mobility Overal bed mobility: Needs Assistance;+2 for physical assistance;+ 2 for safety/equipment Bed Mobility: Supine to Sit;Sit to Supine     Supine to sit: Max assist;+2 for physical assistance;+2 for safety/equipment;HOB elevated Sit to supine: Total assist;+2 for physical assistance   General bed mobility comments: +2 max to come to EOB, able to move RLE off the bed and use RUE to pull to sitting with max assist, +2 for posterior support in sitting. +2 max assist to return to supine, patient able to elevate RLE partially back to bed.  Transfers                    Ambulation/Gait                 Stairs            Wheelchair Mobility    Modified Rankin (Stroke Patients Only) Modified Rankin (Stroke Patients Only) Pre-Morbid  Rankin Score: No symptoms Modified Rankin: Severe disability     Balance   Sitting-balance support: Feet supported Sitting balance-Leahy Scale: Zero Sitting balance - Comments: Continues to required increased assist to maintain statis sitting. Tolerated EOB ~10 minutes for therapeutic activities and trunk control work Postural control: Posterior lean;Left lateral lean                          Cognition Arousal/Alertness: Lethargic Behavior During Therapy: Flat affect Overall Cognitive Status: Difficult to assess Area of Impairment: Attention;Following commands;Awareness;Problem solving   Current Attention Level: Sustained   Following Commands: Follows one step commands consistently     Problem Solving: Requires verbal cues;Requires tactile cues General Comments: patient was able to communicate via writing on paper expressed desire for "cool wet rag !!" difficulty with spacing and legiblity of writing     Exercises Other Exercises Other Exercises: sitting EOB LAQ RLE x10 Other Exercises: AROM Ankle R  Other Exercises: PROM LLE Other Exercises: Trunk control dynamic activities using RUE to pull to trunk flexion and push to extension.  Other Exercises: Part task trunk activity with assist    General Comments        Pertinent Vitals/Pain Pain Assessment: Faces Pain Score: 4  Faces Pain Scale: Hurts even more Pain Location: headh Pain Intervention(s): Monitored during session;Repositioned;Relaxation    Home Living  Prior Function            PT Goals (current goals can now be found in the care plan section) Acute Rehab PT Goals Patient Stated Goal: none stated PT Goal Formulation: With patient/family Time For Goal Achievement: 09/07/15 Potential to Achieve Goals: Fair Progress towards PT goals: Progressing toward goals    Frequency  Min 3X/week    PT Plan Current plan remains appropriate    Co-evaluation              End of Session Equipment Utilized During Treatment: Oxygen (ATC) Activity Tolerance: Patient limited by fatigue Patient left: in bed;with call bell/phone within reach;with family/visitor present (in chair position)     Time: 1610-9604 PT Time Calculation (min) (ACUTE ONLY): 24 min  Charges:  $Therapeutic Activity: 23-37 mins                    G CodesFabio Asa 09/12/15, 11:11 AM Charlotte Crumb, PT DPT  [phone]

## 2015-08-25 NOTE — Progress Notes (Signed)
Pt s/p tracheostomy on 08/23/15; weaning well, per bedside nurse, and improving neurologically.  Planning dc to CIR when medically stable; will continue to follow progress.    Quintella BatonJulie W. Eivan Gallina, RN, BSN  Trauma/Neuro ICU Case Manager [phone]

## 2015-08-26 ENCOUNTER — Inpatient Hospital Stay (HOSPITAL_COMMUNITY): Payer: 59

## 2015-08-26 DIAGNOSIS — J15211 Pneumonia due to Methicillin susceptible Staphylococcus aureus: Secondary | ICD-10-CM

## 2015-08-26 DIAGNOSIS — Z930 Tracheostomy status: Secondary | ICD-10-CM

## 2015-08-26 LAB — GLUCOSE, CAPILLARY
Glucose-Capillary: 160 mg/dL — ABNORMAL HIGH (ref 65–99)
Glucose-Capillary: 149 mg/dL — ABNORMAL HIGH (ref 65–99)
Glucose-Capillary: 142 mg/dL — ABNORMAL HIGH (ref 65–99)
Glucose-Capillary: 135 mg/dL — ABNORMAL HIGH (ref 65–99)

## 2015-08-26 LAB — CBC
HCT: 37.8 % — ABNORMAL LOW (ref 39.0–52.0)
Hemoglobin: 11.8 g/dL — ABNORMAL LOW (ref 13.0–17.0)
MCH: 31.1 pg (ref 26.0–34.0)
MCHC: 31.2 g/dL (ref 30.0–36.0)
MCV: 99.7 fL (ref 78.0–100.0)
Platelets: 256 10*3/uL (ref 150–400)
RBC: 3.79 MIL/uL — ABNORMAL LOW (ref 4.22–5.81)
RDW: 13 % (ref 11.5–15.5)
WBC: 14.1 10*3/uL — ABNORMAL HIGH (ref 4.0–10.5)

## 2015-08-26 LAB — BASIC METABOLIC PANEL
Anion gap: 7 (ref 5–15)
BUN: 33 mg/dL — ABNORMAL HIGH (ref 6–20)
CO2: 27 mmol/L (ref 22–32)
Calcium: 9 mg/dL (ref 8.9–10.3)
Chloride: 114 mmol/L — ABNORMAL HIGH (ref 101–111)
Creatinine, Ser: 0.99 mg/dL (ref 0.61–1.24)
GFR calc Af Amer: >60 mL/min (ref >60)
GFR calc non Af Amer: >60 mL/min (ref >60)
Glucose, Bld: 153 mg/dL — ABNORMAL HIGH (ref 65–99)
Potassium: 3.6 mmol/L (ref 3.5–5.1)
Sodium: 148 mmol/L — ABNORMAL HIGH (ref 135–145)

## 2015-08-26 MED ORDER — LOPERAMIDE HCL 1 MG/5ML PO LIQD
2.0000 mg | Freq: Three times a day (TID) | ORAL | Status: DC | PRN
  Administered 2015-08-26: 2 mg via ORAL
  Filled 2015-08-26 (×2): qty 10

## 2015-08-26 NOTE — Progress Notes (Addendum)
STROKE TEAM PROGRESS NOTE   SUBJECTIVE (INTERVAL HISTORY) RN at the bedside.  Discussed patient.  No events overnight.  Tolerated trach collar.  Patient does require suctioning, but less frequently and has been observed to cough fairly well  No family at bedside this morning   OBJECTIVE Temp:  [99.2 F (37.3 C)-99.9 F (37.7 C)] 99.2 F (37.3 C) (06/10 0400) Pulse Rate:  [81-99] 94 (06/10 0600) Cardiac Rhythm:  [-] Normal sinus rhythm (06/10 0000) Resp:  [20-42] 23 (06/10 0600) BP: (107-151)/(58-86) 151/77 mmHg (06/10 0600) SpO2:  [98 %-100 %] 99 % (06/10 0600) FiO2 (%):  [28 %] 28 % (06/10 0345)  CBC:   Recent Labs Lab 08/22/15 0526 08/26/15 0300  WBC 10.6* 14.1*  HGB 11.8* 11.8*  HCT 39.0 37.8*  MCV 101.0* 99.7  PLT 221 256    Basic Metabolic Panel:  Recent Labs Lab 08/22/15 0500  08/24/15 0350 08/25/15 0256 08/26/15 0300  NA 152*  < > 151* 150* 148*  K 3.7  < > 3.3* 3.2* 3.6  CL 119*  < > 117* 115* 114*  CO2 27  < > 29 26 27   GLUCOSE 135*  < > 151* 156* 153*  BUN 33*  < > 36* 38* 33*  CREATININE 1.00  < > 0.93 1.04 0.99  CALCIUM 8.5*  < > 8.8* 8.9 9.0  MG  --   < > 2.5* 2.4  --   PHOS 3.4  --  3.9  --   --   < > = values in this interval not displayed.  Lipid Panel:     Component Value Date/Time   TRIG 331* 08/22/2015 0526   HgbA1c: No results found for: HGBA1C Urine Drug Screen:     Component Value Date/Time   LABOPIA NONE DETECTED 08/16/2015 0033   COCAINSCRNUR NONE DETECTED 08/16/2015 0033   LABBENZ NONE DETECTED 08/16/2015 0033   AMPHETMU NONE DETECTED 08/16/2015 0033   THCU NONE DETECTED 08/16/2015 0033   LABBARB NONE DETECTED 08/16/2015 0033     IMAGING  Ct Head Wo Contrast 08/20/2015  ADDENDUM: There is herniation of the cerebellar tonsils through the foramen magnum. This is stable when compared to previous studies and likely reflects Chiari malformation although herniation from mass-effect cannot be completely excluded. 08/20/2015  Stable  right basal ganglia hemorrhage with mass-effect and slight midline shift.  08/16/2015   No significant change since the previous study. Right basal ganglia hematoma volume 15.4 cc. Slightly better defined surrounding edema.  08/16/2015   Increased size of acute right basal ganglia hemorrhage, with increased mass effect. There is now 2 mm right to left midline shift. There is developing supratentorial sulcal effacement and persistent basilar cistern effacement concerning for developing cerebral edema.  08/15/2015  Acute right basal ganglia hemorrhage measuring 3.7 x 1.9 x 2.0 cm. Mild surrounding edema and mass effect, no midline shift. There is crowding of the basilar cisterns.   Ct Angio Head & Neck W/cm &/or Wo/cm 08/18/2015  1. Negative CTA of the head and neck. Please note that evaluation is somewhat limited by body habitus and timing of the contrast bolus. 2. Slight interval increase in size of acute right basal ganglia parenchymal hemorrhage, now measuring approximately 16 cc in estimated volume, previously 15.4 cc. Slightly increased and more defined surrounding vasogenic edema. Similar 2 mm right-to-left shift. Supratentorial sulcal effacement concerning for cerebral edema, similar to prior.   Dg Chest Port 1 View 08/26/2015 pending  08/24/2015 Slight interval improvement in left lower  lobe atelectasis or pneumonia. Stable cardiomegaly and stable mild interstitial edema. The support tubes are in reasonable position.  08/23/2015 Stable support apparatus. Cardiomegaly. Central mild vascular congestion without pulmonary edema. Left basilar atelectasis. 08/21/2015   Cardiomegaly with vascular congestion. Improving interstitial edema.  08/20/2015  Mild interstitial edema/CHF.  08/17/2015   1. Lines and tubes in stable position . 2. Cardiomegaly with increased interstitial markings consistent with congestive heart failure. Small left pleural effusion cannot be excluded.  08/16/2015  1. Tip of the right central line  in the mid SVC.  No pneumothorax. 2. Endotracheal and enteric tubes remain in place. 3. Unchanged enlargement of the cardiac silhouette. Decreased vascular congestion and edema from prior.  08/16/2015   1. Endotracheal tube seen ending 2-3 cm above the carina. 2. Lungs mildly hypoexpanded. Vascular congestion noted. Increased interstitial markings raise concern for mild pulmonary edema, though pneumonia could have a similar appearance.   Dg Abd Portable 1v 08/16/2015  No acute findings per Enteric tube with tip in the right upper quadrant likely over the distal stomach or proximal duodenum.   2D Echocardiogram  Left ventricle: The cavity size was normal. There was severe concentric hypertrophy. Systolic function was normal. The estimated ejection fraction was in the range of 50% to 55%. Wall motion was normal; there were no regional wall motion abnormalities. Features are consistent with a pseudonormal left ventricular filling pattern, with concomitant abnormal relaxation and increased filling pressure (grade 2 diastolic dysfunction). Doppler parameters are consistent with high ventricular filling pressure.   PHYSICAL EXAM; Exam is stable today, no changes GENERAL- trach in place, off vent and on trach collar, off sedation, appears stated age, not in acute distress, Obese. HEENT- Atraumatic, normocephalic, Pupils reactive and equal CARDIAC- regular rate and rhythm, no murmurs, rubs or gallops PULM:  Decreased breath sounds at bases ABD:  Obese, soft NT EXTREM:  No C/C/E  NEUROLOGICAL EXAM : trach in place, off vent and on trach collar, off sedation, eye open, able to follow commands on the right side. Nods yes/no. Eyes open able to communicate using right hand  Pupils 2 mm reactive. Fundi not visualized. Eyes mid position, slightly dysconjugate gaze with mild exotropia left eye and difficulty with crossing the midline on the left. Eye closure intact but stronger on the right, left facial droop,  tongue in mid position  LLE and LUE 0/5 and no spontaneous movement, RUE and RLE 4/5   Sensation:  Patient indicates no difference  Coordination and gait not tested.   ASSESSMENT/PLAN [name] is a 30 y.o. male with history of hypertension presenting with L sided weakness. CT showed a L basal ganglia hemorrhage, with change in mental status, pt had repeat Ct head which showed mildly increased size of hmg, and increased mass effect. Pt and was subsequently intubated with difficulty weaning, s/p trach.  Stroke:  Right BG ICH hemorrhage secondary to hypertension   Resultant  L hemiplegia, VDRF  Initial CT R BG hemorrhage 3.7x1.9x2, mild edema & mass effect  Repeat CT with neuro worsening increased size of R BG hmg w/ increased mass effect and cerebral edema.   Repeat CT same.  Stable hematoma. Unchanged shift or edema.  CTA head & neck unremarkable  2D Echo  EF 50-55%. No source of embolus   SCDs and lovenox for VTE prophylaxis  Diet NPO time specified, on tube feeds.  No antithrombotic prior to admission, not on antithrombotics due to ICH  Ongoing aggressive stroke risk factor management  Therapy recommendations:  CIR. Admissions coordinator following  Disposition:  pending   Acute respiratory failure MSSA bronchitis vs PNA  Intubated in the ED  CCM managing vent, Off propofol and fentanyl this am  MSSA in sputum. abx narrowed from zosyn to ancef  Did not tolerate wean  Trach placement by CCM 08/23/2015  If unable to wean, ? Consider LTACH  Critical Care team to trial trach collar and determine if able to tolerate overnight without mechanical ventilation  Dysphagia  Diet NPO time specified   On tube feedings  Will need to discuss PEG if patient unable to pass evaluation with speaking valve in place   Medically induced hypernatremia for Cerebral Edema control  3% off   NA slowing drifting down, 148 today  Rash - allergic  During last week,  previous discussion with inpt pharmacy and ancef was considered as most likely culprit, metoprolol less likely. Pt was on lisinopril at home  Thus Ancef discontinued since Abx treatment had been 6 days now  WBC was normalized However, now 14.1 On Cefazolin   Hypertensive Emergency  BP 182/124 on arrival in setting of acute neurologic symptoms  BP goal < 160   Treated with cardene, now off  metoprolol, lisinopril, triamterene-HCTZ 37.2/25   BP stabilizing   Other Stroke Risk Factors  Morbid Obesity, Body mass index is 44.43 kg/(m^2).   Acute on chronic diastolic CHF, grade 2 chronic CHF  High risk OSA. OP eval recommended  Other Active Problems  Anemia, mild  Leukocytosis, worsened.  Will re-culture if temp spikes  Low grade fever.  MSSA in sputum  Hypokalemia, replaced  Loose stools that are c.dif negative.  Ordered Imodium  Hospital day # 10    CRITICAL CARE NEUROLOGY ATTENDING NOTE Patient was seen and examined by me personally. I independently viewed imaging studies, participated in medical decision making and plan of care. The laboratory and radiographic studies were personally reviewed by me.  ROS completed by me personally and pertinent positives fully assessed.  Patient denies complaints by nodding and shaking head.  Assessment and plan completed by me personally and fully documented above.  Condition is unchanged    This patient is critically ill and at significant risk of neurological worsening, death and care requires constant monitoring of vital signs, hemodynamics,respiratory and cardiac monitoring, extensive review of multiple databases, frequent neurological assessment, discussion with family, other specialists and medical decision making of high complexity.  This critical care time does not reflect procedure time, or teaching time or supervisory time of PA/NP/Med Resident etc. but could involve care discussion time.  I spent 30 minutes of  Neurocritical Care time in the care of  this patient.   SIGNED BY: Dr. Sula Soda

## 2015-08-26 NOTE — Progress Notes (Signed)
PULMONARY / CRITICAL CARE MEDICINE   Name: Brad Mcgee MRN: [id number] DOB: 12/01/1985    ADMISSION DATE:  08/15/2015 CONSULTATION DATE:  08/16/2015  REFERRING MD:  Dr. Amada JupiterKirkpatrick  CHIEF COMPLAINT:  Left side weakness  HISTORY OF PRESENT ILLNESS:  Hx from chart. 30 yo male with slurred speech and Lt sided weakness.  This was associated with headache.  He was intubated for airway protection.  He had CT head which showed 3.7 x 1.9 x 2 cm Rt basal ganglia ICH which then increased to 4.5 x 1.9 x 3.4 cm ICH on f/u CT head.  His BP was 187/106.  SUBJECTIVE:  Awake and interactive, following commands on the right not left.   VITAL SIGNS: BP 129/75 mmHg  Pulse 99  Temp(Src) 99.9 F (37.7 C) (Core (Comment))  Resp 27  Ht 5\' 7"  (1.702 m)  Wt 128.7 kg (283 lb 11.7 oz)  BMI 44.43 kg/m2  SpO2 98%  HEMODYNAMICS:    VENTILATOR SETTINGS: Vent Mode:  [-]  FiO2 (%):  [28 %] 28 %  INTAKE / OUTPUT: I/O last 3 completed shifts: In: 3325 [I.V.:370; NG/GT:2955] Out: 2925 [Urine:2550; Stool:375]  PHYSICAL EXAMINATION: General:  Obese. No distress. Neuro:  Pupils symmetric. Moves R arm spont and to command.  Opens eyes with stimulation. Followscommands. L hemiplegia. Aware and alert HEENT: Trach in place. No scleral icterus or injection. Copious secretions, white Cardiovascular:  Regular rate & rhythm. No appreciable JVD given body habitus. Lungs:  Distant breath sounds. Symmetric chest rise on vent. Diminshed in bases Abdomen:  Soft. Protuberant. Normal bowel sounds. Integument:  Warm & dry. No rash on exposed skin.  LABS:  BMET  Recent Labs Lab 08/24/15 0350 08/25/15 0256 08/26/15 0300  NA 151* 150* 148*  K 3.3* 3.2* 3.6  CL 117* 115* 114*  CO2 29 26 27   BUN 36* 38* 33*  CREATININE 0.93 1.04 0.99  GLUCOSE 151* 156* 153*   Electrolytes  Recent Labs Lab 08/21/15 0555 08/22/15 0500 08/22/15 0526  08/24/15 0350 08/25/15 0256 08/26/15 0300  CALCIUM 8.5* 8.5*  --   < >  8.8* 8.9 9.0  MG 2.4  --  2.4  --  2.5* 2.4  --   PHOS 3.9 3.4  --   --  3.9  --   --   < > = values in this interval not displayed.  CBC  Recent Labs Lab 08/21/15 0555 08/22/15 0526 08/26/15 0300  WBC 10.8* 10.6* 14.1*  HGB 13.1 11.8* 11.8*  HCT 42.5 39.0 37.8*  PLT 220 221 256    Coag's  Recent Labs Lab 08/23/15 1100  INR 1.18    Sepsis Markers  Recent Labs Lab 08/20/15 0548  PROCALCITON 0.24    ABG No results for input(s): PHART, PCO2ART, PO2ART in the last 168 hours.  Liver Enzymes  Recent Labs Lab 08/20/15 0548 08/21/15 0555 08/22/15 0500  ALBUMIN 2.6* 2.9* 2.5*    Cardiac Enzymes No results for input(s): TROPONINI, PROBNP in the last 168 hours.  Glucose  Recent Labs Lab 08/25/15 1119 08/25/15 1522 08/25/15 1937 08/25/15 2357 08/26/15 0418 08/26/15 0845  GLUCAP 183* 133* 134* 156* 160* 149*    Imaging Dg Chest Port 1 View  08/26/2015  CLINICAL DATA:  30 year old male with a history of respiratory failure EXAM: PORTABLE CHEST 1 VIEW COMPARISON:  08/24/2015, [id number] FINDINGS: Cardiomediastinal silhouette unchanged with cardiomegaly. Unchanged position of tracheostomy tube. Unchanged position of enteric feeding tube, which terminates out of the field of view.  Interval removal of IJ central catheter. Low lung volumes.  Retrocardiac opacity. No visualized pneumothorax. IMPRESSION: Low lung volumes with potential atelectasis/ consolidation at the left base. Interval removal of right IJ catheter. Unchanged tracheostomy tube and enteric feeding tube. Signed, Yvone Neu. Loreta Ave, DO [facility] Radiology Electronically Signed   By: Gilmer Mor D.O.   On: 08/26/2015 08:23    STUDIES:  5/30 CT Head: 3.7 x 1.9 x 2 cm Rt basal ganglia ICH 5/31 CT Head:  No significant change. 5.2x1.9x3.0cm right basal ganglia hematoma. Mild mass effect. 5/31 TTE: Severe concentric LVH with EF 50-55%. No regional wall motion  abnormalities. Grade 2 diastolic dysfunction. LA & RA normal in size. RV normal in size and function. No aortic stenosis or regurgitation. Aortic root normal in size. Trivial mitral regurgitation without stenosis. No pulmonic regurgitation or stenosis. No tricuspid regurgitation. No pericardial effusion. 6/01 Port CXR:  Increased bilateral interstitial markings. Unable to appreciate pleural effusion. ETT & CVL in stable position.  MICROBIOLOGY: MRSA PCR 5/31:  Negative  Sputum 6/2 GPC>>MSSA Blood 6/2>>  ANTIBIOTICS: 6/3 vanc>>6/4 6/3 zoysn>> 6/4 6/4 cefazolin >>    SIGNIFICANT EVENTS: 5/30 Admit 5/31 Start 3% NS>> 6/7   LINES/TUBES: OETT 7.5 5/31 >> 6/7 R IJ CVL 5/31 >> OGT 5/31 >> FOLEY 5/31 >> PIV x3  ASSESSMENT / PLAN:  NEUROLOGIC A:   R Basal Ganglia ICH / CVA Sedation on Ventilator > tolerating ATC 6/9  P:   Management per Neurology RASS goal: 0 Fentanyl IV prn 3% HS Saline now off  PULMONARY A: VDRF Compromised Airway - Secondary to ICH.  Acute on chronic Diastolic CHF & 3% NaCl High risk for OSA  P:   Attempt TC 24/7 at this point Off 3% saline Would need CPAP if / when decannulated If stays off the vent til AM will transfer to SDU. Swallow evaluation in AM  CARDIOVASCULAR A:  Hypertensive Emergency Acute on Chronic Diastolic CHF - Likely secondary to 3% HS. Elevated Troponin I - Likely demand ischemia. No wall motion abnormalities on TTE. Grade 2 Diastolic CHF   P:  Vitals per Unit Protocol Monitoring on telemetry Off Cardene to keep SBP < 160 Labetalol IV prn Lisinopril 20mg  QD, metoprolol 25 bid, Maxide-37.5 / 25 QD Holding on Lasix for now  RENAL  Recent Labs Lab 08/24/15 0350 08/25/15 0256 08/26/15 0300  NA 151* 150* 148*    A:   Hypernatremia - Medically induced. Hyperchloremia - Medically induced.  P:   No 3% Trending electrolytes & renal function daily Following UOP with Foley Replace electrolytes as  indicated  GASTROINTESTINAL A:   Nutrition.  P:   Continuing Tube feeds, assess for cuff down w SLP, possibly diet Pepcid VT qhs  HEMATOLOGIC Wbc 11.2->13.1 A:   Anemia - Mild. Leukocytosis - Mild.  P:  Trending cell counts w/ CBC SCDs for prophylaxis On enoxaparin for DVT prophylaxis given stability of Head Ct  INFECTIOUS A:   FUO - possibly secondary to ICH.  MSSA bronchitis vs PNA P:   Follow Blood, Urine, & Tracheal Aspirate Cultures PCT 0.10 > 0.24 Empiric abx narrowed to cefazolin on 6/4 now off 7 days total given.  ENDOCRINE CBG (last 3)   Recent Labs  08/25/15 2357 08/26/15 0418 08/26/15 0845  GLUCAP 156* 160* 149*   A:   No acute issues.    P:   Monitor blood sugar on daily labs.  FAMILY UPDATES:  Mother updated bedside.  Discussed with bedside RN.  Alyson Reedy, M.D. [facility] Pulmonary/Critical Care Medicine. Pager: [phone]. After hours pager: [phone].

## 2015-08-27 DIAGNOSIS — D72829 Elevated white blood cell count, unspecified: Secondary | ICD-10-CM | POA: Diagnosis present

## 2015-08-27 DIAGNOSIS — Z930 Tracheostomy status: Secondary | ICD-10-CM | POA: Diagnosis not present

## 2015-08-27 DIAGNOSIS — R509 Fever, unspecified: Secondary | ICD-10-CM | POA: Insufficient documentation

## 2015-08-27 LAB — GLUCOSE, CAPILLARY
Glucose-Capillary: 142 mg/dL — ABNORMAL HIGH (ref 65–99)
Glucose-Capillary: 156 mg/dL — ABNORMAL HIGH (ref 65–99)
Glucose-Capillary: 158 mg/dL — ABNORMAL HIGH (ref 65–99)
Glucose-Capillary: 163 mg/dL — ABNORMAL HIGH (ref 65–99)
Glucose-Capillary: 171 mg/dL — ABNORMAL HIGH (ref 65–99)
Glucose-Capillary: 173 mg/dL — ABNORMAL HIGH (ref 65–99)

## 2015-08-27 LAB — BASIC METABOLIC PANEL
Anion gap: 7 (ref 5–15)
BUN: 34 mg/dL — ABNORMAL HIGH (ref 6–20)
CO2: 25 mmol/L (ref 22–32)
Calcium: 9.2 mg/dL (ref 8.9–10.3)
Chloride: 113 mmol/L — ABNORMAL HIGH (ref 101–111)
Creatinine, Ser: 0.93 mg/dL (ref 0.61–1.24)
GFR calc Af Amer: >60 mL/min (ref >60)
GFR calc non Af Amer: >60 mL/min (ref >60)
Glucose, Bld: 163 mg/dL — ABNORMAL HIGH (ref 65–99)
Potassium: 3.8 mmol/L (ref 3.5–5.1)
Sodium: 145 mmol/L (ref 135–145)

## 2015-08-27 LAB — CBC
HCT: 42.5 % (ref 39.0–52.0)
Hemoglobin: 13.3 g/dL (ref 13.0–17.0)
MCH: 31.3 pg (ref 26.0–34.0)
MCHC: 31.3 g/dL (ref 30.0–36.0)
MCV: 100 fL (ref 78.0–100.0)
Platelets: 257 10*3/uL (ref 150–400)
RBC: 4.25 MIL/uL (ref 4.22–5.81)
RDW: 13.1 % (ref 11.5–15.5)
WBC: 17 10*3/uL — ABNORMAL HIGH (ref 4.0–10.5)

## 2015-08-27 LAB — MAGNESIUM: Magnesium: 2.4 mg/dL (ref 1.7–2.4)

## 2015-08-27 LAB — PROCALCITONIN: Procalcitonin: <0.1 ng/mL

## 2015-08-27 LAB — PHOSPHORUS: Phosphorus: 4.6 mg/dL (ref 2.5–4.6)

## 2015-08-27 NOTE — Progress Notes (Signed)
STROKE TEAM PROGRESS NOTE   SUBJECTIVE (INTERVAL HISTORY) RN at the bedside.  Discussed patient.  No events overnight.  Tolerated trach collar.  No family at bedside this morning.  ROS completed and patient had no complaints   OBJECTIVE Temp:  [99 F (37.2 C)-100.4 F (38 C)] 99 F (37.2 C) (06/11 0400) Pulse Rate:  [79-102] 99 (06/11 0500) Cardiac Rhythm:  [-] Normal sinus rhythm;Sinus tachycardia (06/10 2100) Resp:  [0-38] 38 (06/11 0500) BP: (111-153)/(55-89) 153/76 mmHg (06/11 0500) SpO2:  [98 %-100 %] 99 % (06/11 0500) FiO2 (%):  [28 %] 28 % (06/11 0344) Weight:  [127.6 kg (281 lb 4.9 oz)] 127.6 kg (281 lb 4.9 oz) (06/10 1400)  CBC:   Recent Labs Lab 08/26/15 0300 08/27/15 0356  WBC 14.1* 17.0*  HGB 11.8* 13.3  HCT 37.8* 42.5  MCV 99.7 100.0  PLT 256 257    Basic Metabolic Panel:   Recent Labs Lab 08/24/15 0350 08/25/15 0256 08/26/15 0300 08/27/15 0356  NA 151* 150* 148* 145  K 3.3* 3.2* 3.6 3.8  CL 117* 115* 114* 113*  CO2 GLUCOSE 151* 156* 153* 163*  BUN 36* 38* 33* 34*  CREATININE 0.93 1.04 0.99 0.93  CALCIUM 8.8* 8.9 9.0 9.2  MG 2.5* 2.4  --  2.4  PHOS 3.9  --   --  4.6    Lipid Panel:     Component Value Date/Time   TRIG 331* 08/22/2015 0526   HgbA1c: No results found for: HGBA1C Urine Drug Screen:     Component Value Date/Time   LABOPIA NONE DETECTED 08/16/2015 0033   COCAINSCRNUR NONE DETECTED 08/16/2015 0033   LABBENZ NONE DETECTED 08/16/2015 0033   AMPHETMU NONE DETECTED 08/16/2015 0033   THCU NONE DETECTED 08/16/2015 0033   LABBARB NONE DETECTED 08/16/2015 0033     IMAGING  Ct Head Wo Contrast 08/20/2015  ADDENDUM: There is herniation of the cerebellar tonsils through the foramen magnum. This is stable when compared to previous studies and likely reflects Chiari malformation although herniation from mass-effect cannot be completely excluded. 08/20/2015  Stable right basal ganglia hemorrhage with mass-effect and slight  midline shift.  08/16/2015   No significant change since the previous study. Right basal ganglia hematoma volume 15.4 cc. Slightly better defined surrounding edema.  08/16/2015   Increased size of acute right basal ganglia hemorrhage, with increased mass effect. There is now 2 mm right to left midline shift. There is developing supratentorial sulcal effacement and persistent basilar cistern effacement concerning for developing cerebral edema.  08/15/2015  Acute right basal ganglia hemorrhage measuring 3.7 x 1.9 x 2.0 cm. Mild surrounding edema and mass effect, no midline shift. There is crowding of the basilar cisterns.   Ct Angio Head & Neck W/cm &/or Wo/cm 08/18/2015  1. Negative CTA of the head and neck. Please note that evaluation is somewhat limited by body habitus and timing of the contrast bolus. 2. Slight interval increase in size of acute right basal ganglia parenchymal hemorrhage, now measuring approximately 16 cc in estimated volume, previously 15.4 cc. Slightly increased and more defined surrounding vasogenic edema. Similar 2 mm right-to-left shift. Supratentorial sulcal effacement concerning for cerebral edema, similar to prior.   Dg Chest Port 1 View 08/26/2015 pending  08/24/2015 Slight interval improvement in left lower lobe atelectasis or pneumonia. Stable cardiomegaly and stable mild interstitial edema. The support tubes are in reasonable position.  08/23/2015 Stable support apparatus. Cardiomegaly. Central mild vascular congestion without pulmonary edema.  Left basilar atelectasis. 08/21/2015   Cardiomegaly with vascular congestion. Improving interstitial edema.  08/20/2015  Mild interstitial edema/CHF.  08/17/2015   1. Lines and tubes in stable position . 2. Cardiomegaly with increased interstitial markings consistent with congestive heart failure. Small left pleural effusion cannot be excluded.  08/16/2015  1. Tip of the right central line in the mid SVC.  No pneumothorax. 2. Endotracheal and  enteric tubes remain in place. 3. Unchanged enlargement of the cardiac silhouette. Decreased vascular congestion and edema from prior.  08/16/2015   1. Endotracheal tube seen ending 2-3 cm above the carina. 2. Lungs mildly hypoexpanded. Vascular congestion noted. Increased interstitial markings raise concern for mild pulmonary edema, though pneumonia could have a similar appearance.   Dg Abd Portable 1v 08/16/2015  No acute findings per Enteric tube with tip in the right upper quadrant likely over the distal stomach or proximal duodenum.   2D Echocardiogram  Left ventricle: The cavity size was normal. There was severe concentric hypertrophy. Systolic function was normal. The estimated ejection fraction was in the range of 50% to 55%. Wall motion was normal; there were no regional wall motion abnormalities. Features are consistent with a pseudonormal left ventricular filling pattern, with concomitant abnormal relaxation and increased filling pressure (grade 2 diastolic dysfunction). Doppler parameters are consistent with high ventricular filling pressure.   PHYSICAL EXAM; Exam is stable today, no changes GENERAL- trach in place, on trach collar, off sedation, appears stated age, not in acute distress, Obese. HEENT- Atraumatic, normocephalic, Pupils reactive and equal CARDIAC- regular rate and rhythm, no murmurs, rubs or gallops PULM:  Decreased breath sounds at bases ABD:  Obese, soft NT EXTREM:  No C/C/E  NEUROLOGICAL EXAM : trach in place, on trach collar, off sedation, eye open, able to follow commands on the right side. Nods yes/no. Eyes open able to communicate using right hand  Pupils 2 mm reactive. Fundi not visualized. Eyes mid position, slightly dysconjugate gaze with mild exotropia left eye and difficulty with crossing the midline on the left. Eye closure intact but stronger on the right, left facial droop, tongue in mid position  LLE and LUE 0/5 and no spontaneous movement, RUE and RLE  4/5   Sensation:  Patient indicates no difference  Coordination and gait not tested.   ASSESSMENT/PLAN Mr. [name] M Raspberry is a 30 y.o. male with history of hypertension presenting with L sided weakness. CT showed a L basal ganglia hemorrhage, with change in mental status, pt had repeat Ct head which showed mildly increased size of hmg, and increased mass effect. Pt and was subsequently intubated with difficulty weaning, s/p trach.  Stroke:  Right BG ICH hemorrhage secondary to hypertension   Resultant  L hemiplegia, VDRF  Initial CT R BG hemorrhage 3.7x1.9x2, mild edema & mass effect  Repeat CT with neuro worsening increased size of R BG hmg w/ increased mass effect and cerebral edema.   Repeat CT same.  Stable hematoma. Unchanged shift or edema.  CTA head & neck unremarkable  2D Echo  EF 50-55%. No source of embolus   SCDs and lovenox for VTE prophylaxis  Diet NPO time specified, on tube feeds.  No antithrombotic prior to admission, not on antithrombotics due to ICH  Ongoing aggressive stroke risk factor management  Therapy recommendations:  CIR. Admissions coordinator following  Disposition:  pending   Acute respiratory failure MSSA bronchitis vs PNA  Intubated in the ED  CCM managing vent, Off propofol and fentanyl this am  MSSA in sputum. abx narrowed from zosyn to ancef  Did not tolerate wean  Trach placement by CCM 08/23/2015  If unable to wean, ? Consider LTACH  Critical Care team to trial trach collar and determine if able to tolerate overnight without mechanical ventilation  Dysphagia  Diet NPO time specified   On tube feedings  Will need to discuss PEG if patient unable to pass evaluation with speaking valve in place   Medically induced hypernatremia for Cerebral Edema control  3% off   NA slowing drifting down, 148 today  Rash - allergic  During last week, previous discussion with inpt pharmacy and ancef was considered as most likely  culprit, metoprolol less likely. Pt was on lisinopril at home  Thus Ancef discontinued since Abx treatment had been 6 days now   Hypertensive Emergency  BP 182/124 on arrival in setting of acute neurologic symptoms  BP goal < 160   Treated with cardene, now off  metoprolol, lisinopril, triamterene-HCTZ 37.2/25   BP stabilizing   Other Stroke Risk Factors  Morbid Obesity, Body mass index is 44.05 kg/(m^2).   Acute on chronic diastolic CHF, grade 2 chronic CHF  High risk OSA. OP eval recommended  Other Active Problems  Anemia, mild  Leukocytosis, worsened.  WBC was normalized; however, yesterday 14.1 and now 17 On Cefazolin Will re-culture today despite no spikes in temperature.  If evaluation does not reveal source, will need to consider sinuses and non-infectious etiologies for leukocytosis   Low grade fever Tmax=100.4.  MSSA in sputum  Hypokalemia, improved today  Loose stools that are c.dif negative.  Improving with Imodium  Hospital day # 11    CRITICAL CARE NEUROLOGY ATTENDING NOTE Patient was seen and examined by me personally. I independently viewed imaging studies, participated in medical decision making and plan of care. The laboratory and radiographic studies were personally reviewed by me.  ROS completed by me personally and pertinent positives fully assessed.  Patient denies complaints by nodding and shaking head.  Assessment and plan completed by me personally.  Today:  Patient can transfer to step-down  Undergoing pan-culture to evaluate for leukocytosis (see discussion above)  Continue Imodium  Condition is unchanged    Total time spent: 45 minutes   SIGNED BY: Dr. Sula Soda

## 2015-08-27 NOTE — Progress Notes (Signed)
PULMONARY / CRITICAL CARE MEDICINE   Name: Brad Mcgee MRN: [id number] DOB: 10-24-1985    ADMISSION DATE:  08/15/2015 CONSULTATION DATE:  08/16/2015  REFERRING MD:  Dr. Amada Jupiter  CHIEF COMPLAINT:  Left side weakness  HISTORY OF PRESENT ILLNESS:  Hx from chart. 30 yo male with slurred speech and Lt sided weakness.  This was associated with headache.  He was intubated for airway protection.  He had CT head which showed 3.7 x 1.9 x 2 cm Rt basal ganglia ICH which then increased to 4.5 x 1.9 x 3.4 cm ICH on f/u CT head.  His BP was 187/106.  SUBJECTIVE:  Awake and interactive, following commands on the right not left.   VITAL SIGNS: BP 117/62 mmHg  Pulse 87  Temp(Src) 99.8 F (37.7 C) (Oral)  Resp 26  Ht  (1.702 m)  Wt 127.6 kg (281 lb 4.9 oz)  BMI 44.05 kg/m2  SpO2 100%  HEMODYNAMICS:    VENTILATOR SETTINGS: Vent Mode:  [-]  FiO2 (%):  [28 %] 28 %  INTAKE / OUTPUT: I/O last 3 completed shifts: In: 3175 [I.V.:340; NG/GT:2835] Out: 2300 [Urine:1925; Stool:375]  PHYSICAL EXAMINATION: General:  Obese. No distress. Neuro:  Pupils symmetric. Moves R arm spont and to command.  Opens eyes with stimulation. Followscommands. L hemiplegia. Aware and alert HEENT: Trach in place. No scleral icterus or injection. Copious secretions, white Cardiovascular:  Regular rate & rhythm. No appreciable JVD given body habitus. Lungs:  Distant breath sounds. Symmetric chest rise on vent. Diminshed in bases Abdomen:  Soft. Protuberant. Normal bowel sounds. Integument:  Warm & dry. No rash on exposed skin.  LABS:  BMET  Recent Labs Lab 08/25/15 0256 08/26/15 0300 08/27/15 0356  NA 150* 148* 145  K 3.2* 3.6 3.8  CL 115* 114* 113*  CO2 BUN 38* 33* 34*  CREATININE 1.04 0.99 0.93  GLUCOSE 156* 153* 163*   Electrolytes  Recent Labs Lab 08/22/15 0500  08/24/15 0350 08/25/15 0256 08/26/15 0300 08/27/15 0356  CALCIUM 8.5*  < > 8.8* 8.9 9.0 9.2  MG  --   < > 2.5*  2.4  --  2.4  PHOS 3.4  --  3.9  --   --  4.6  < > = values in this interval not displayed.  CBC  Recent Labs Lab 08/22/15 0526 08/26/15 0300 08/27/15 0356  WBC 10.6* 14.1* 17.0*  HGB 11.8* 11.8* 13.3  HCT 39.0 37.8* 42.5  PLT 221 256 257    Coag's  Recent Labs Lab 08/23/15 1100  INR 1.18    Sepsis Markers No results for input(s): LATICACIDVEN, PROCALCITON, O2SATVEN in the last 168 hours.  ABG No results for input(s): PHART, PCO2ART, PO2ART in the last 168 hours.  Liver Enzymes  Recent Labs Lab 08/21/15 0555 08/22/15 0500  ALBUMIN 2.9* 2.5*    Cardiac Enzymes No results for input(s): TROPONINI, PROBNP in the last 168 hours.  Glucose  Recent Labs Lab 08/26/15 0418 08/26/15 0845 08/26/15 1549 08/26/15 2102 08/27/15 0434 08/27/15 0810  GLUCAP 160* 149* 142* 135* 173* 163*    Imaging No results found.  STUDIES:  5/30 CT Head: 3.7 x 1.9 x 2 cm Rt basal ganglia ICH 5/31 CT Head:  No significant change. 5.2x1.9x3.0cm right basal ganglia hematoma. Mild mass effect. 5/31 TTE: Severe concentric LVH with EF 50-55%. No regional wall motion abnormalities. Grade 2 diastolic dysfunction. LA & RA normal in size. RV normal in size and function. No aortic  stenosis or regurgitation. Aortic root normal in size. Trivial mitral regurgitation without stenosis. No pulmonic regurgitation or stenosis. No tricuspid regurgitation. No pericardial effusion. 6/01 Port CXR:  Increased bilateral interstitial markings. Unable to appreciate pleural effusion. ETT & CVL in stable position.  MICROBIOLOGY: MRSA PCR 5/31:  Negative  Sputum 6/2 GPC>>MSSA Blood 6/2>>  ANTIBIOTICS: 6/3 vanc>>6/4 6/3 zoysn>> 6/4 6/4 cefazolin >>    SIGNIFICANT EVENTS: 5/30 Admit 5/31 Start 3% NS>> 6/7   LINES/TUBES: OETT 7.5 5/31 >> 6/7 R IJ CVL 5/31 >> OGT 5/31 >> FOLEY 5/31 >> PIV x3  I reviewed CXR myself, trach in good position.  ASSESSMENT / PLAN:  NEUROLOGIC A:   R Basal Ganglia  ICH / CVA Sedation on Ventilator > tolerating ATC 6/9  P:   Management per Neurology RASS goal: 0 Fentanyl IV prn 3% HS Saline now off  PULMONARY A: VDRF Compromised Airway - Secondary to ICH.  Acute on chronic Diastolic CHF & 3% NaCl High risk for OSA  P:   TC 24/7 May transfer to SDU Off 3% saline Would need CPAP if / when decannulated Swallow evaluation, hopefully can avoid PEG Pan cultured, check PCT and hold off abx for now.  CARDIOVASCULAR A:  Hypertensive Emergency Acute on Chronic Diastolic CHF - Likely secondary to 3% HS. Elevated Troponin I - Likely demand ischemia. No wall motion abnormalities on TTE. Grade 2 Diastolic CHF   P:  Vitals per Unit Protocol Monitoring on telemetry Off Cardene to keep SBP < 160 Labetalol IV prn Lisinopril 20mg  QD, metoprolol 25 bid, Maxide-37.5 / 25 QD Holding on Lasix for now  RENAL  Recent Labs Lab 08/25/15 0256 08/26/15 0300 08/27/15 0356  NA 150* 148* 145   A:   Hypernatremia - Medically induced. Hyperchloremia - Medically induced.  P:   No 3% Trending electrolytes & renal function daily Following UOP with Foley Replace electrolytes as indicated  GASTROINTESTINAL A:   Nutrition.  P:   Continuing Tube feeds, assess for cuff down w SLP, possibly diet Pepcid VT qhs  HEMATOLOGIC Wbc 11.2->13.1 A:   Anemia - Mild. Leukocytosis - Mild.  P:  Trending cell counts w/ CBC SCDs for prophylaxis On enoxaparin for DVT prophylaxis given stability of Head Ct  INFECTIOUS A:   FUO - possibly secondary to ICH.  MSSA bronchitis vs PNA P:   Recultured 6/11 Repeat PCT Empiric abx narrowed to cefazolin on 6/4 now off 7 days total given, hold off abx for now.  ENDOCRINE CBG (last 3)   Recent Labs  08/26/15 2102 08/27/15 0434 08/27/15 0810  GLUCAP 135* 173* 163*   A:   No acute issues.    P:   Monitor blood sugar on daily labs.  FAMILY UPDATES:  Mother updated bedside.  Discussed with bedside  RN.  Alyson ReedyWesam G. Yacoub, M.D. [facility] Pulmonary/Critical Care Medicine. Pager: [phone]. After hours pager: [phone].

## 2015-08-28 DIAGNOSIS — D72829 Elevated white blood cell count, unspecified: Secondary | ICD-10-CM

## 2015-08-28 LAB — BASIC METABOLIC PANEL
Anion gap: 7 (ref 5–15)
BUN: 41 mg/dL — ABNORMAL HIGH (ref 6–20)
CO2: 26 mmol/L (ref 22–32)
Calcium: 9.1 mg/dL (ref 8.9–10.3)
Chloride: 112 mmol/L — ABNORMAL HIGH (ref 101–111)
Creatinine, Ser: 1.02 mg/dL (ref 0.61–1.24)
GFR calc Af Amer: >60 mL/min (ref >60)
GFR calc non Af Amer: >60 mL/min (ref >60)
Glucose, Bld: 181 mg/dL — ABNORMAL HIGH (ref 65–99)
Potassium: 4.1 mmol/L (ref 3.5–5.1)
Sodium: 145 mmol/L (ref 135–145)

## 2015-08-28 LAB — CBC
HCT: 41 % (ref 39.0–52.0)
Hemoglobin: 12.9 g/dL — ABNORMAL LOW (ref 13.0–17.0)
MCH: 31.3 pg (ref 26.0–34.0)
MCHC: 31.5 g/dL (ref 30.0–36.0)
MCV: 99.5 fL (ref 78.0–100.0)
Platelets: 292 10*3/uL (ref 150–400)
RBC: 4.12 MIL/uL — ABNORMAL LOW (ref 4.22–5.81)
RDW: 13 % (ref 11.5–15.5)
WBC: 21 10*3/uL — ABNORMAL HIGH (ref 4.0–10.5)

## 2015-08-28 LAB — GLUCOSE, CAPILLARY
Glucose-Capillary: 194 mg/dL — ABNORMAL HIGH (ref 65–99)
Glucose-Capillary: 149 mg/dL — ABNORMAL HIGH (ref 65–99)
Glucose-Capillary: 166 mg/dL — ABNORMAL HIGH (ref 65–99)
Glucose-Capillary: 185 mg/dL — ABNORMAL HIGH (ref 65–99)
Glucose-Capillary: 156 mg/dL — ABNORMAL HIGH (ref 65–99)
Glucose-Capillary: 118 mg/dL — ABNORMAL HIGH (ref 65–99)
Glucose-Capillary: 128 mg/dL — ABNORMAL HIGH (ref 65–99)

## 2015-08-28 LAB — MAGNESIUM: Magnesium: 2.4 mg/dL (ref 1.7–2.4)

## 2015-08-28 LAB — PHOSPHORUS: Phosphorus: 4.4 mg/dL (ref 2.5–4.6)

## 2015-08-28 LAB — PROCALCITONIN: Procalcitonin: <0.1 ng/mL

## 2015-08-28 MED ORDER — ACETAMINOPHEN 650 MG RE SUPP
650.0000 mg | RECTAL | Status: DC | PRN
Start: 2015-08-28 — End: 2015-09-01
  Administered 2015-08-29: 650 mg via RECTAL
  Filled 2015-08-28: qty 1

## 2015-08-28 MED ORDER — PIPERACILLIN-TAZOBACTAM 3.375 G IVPB 30 MIN
3.3750 g | Freq: Once | INTRAVENOUS | Status: AC
  Administered 2015-08-28: 3.375 g via INTRAVENOUS
  Filled 2015-08-28: qty 50

## 2015-08-28 MED ORDER — CHLORHEXIDINE GLUCONATE 0.12 % MT SOLN
15.0000 mL | Freq: Two times a day (BID) | OROMUCOSAL | Status: DC
  Administered 2015-08-28 – 2015-09-01 (×7): 15 mL via OROMUCOSAL
  Filled 2015-08-28 (×7): qty 15

## 2015-08-28 MED ORDER — PIPERACILLIN-TAZOBACTAM 3.375 G IVPB
3.3750 g | Freq: Three times a day (TID) | INTRAVENOUS | Status: DC
  Administered 2015-08-28 – 2015-08-30 (×6): 3.375 g via INTRAVENOUS
  Filled 2015-08-28 (×8): qty 50

## 2015-08-28 MED ORDER — CETYLPYRIDINIUM CHLORIDE 0.05 % MT LIQD
7.0000 mL | Freq: Two times a day (BID) | OROMUCOSAL | Status: DC
  Administered 2015-08-29 – 2015-09-01 (×5): 7 mL via OROMUCOSAL

## 2015-08-28 MED ORDER — ACETAMINOPHEN 160 MG/5ML PO SOLN
650.0000 mg | ORAL | Status: DC | PRN
  Administered 2015-08-28 – 2015-08-31 (×2): 650 mg via ORAL
  Filled 2015-08-28 (×2): qty 20.3

## 2015-08-28 MED ORDER — VANCOMYCIN HCL IN DEXTROSE 1-5 GM/200ML-% IV SOLN
1000.0000 mg | Freq: Three times a day (TID) | INTRAVENOUS | Status: DC
  Administered 2015-08-28 – 2015-08-29 (×3): 1000 mg via INTRAVENOUS
  Filled 2015-08-28 (×5): qty 200

## 2015-08-28 MED ORDER — VANCOMYCIN HCL 10 G IV SOLR
2500.0000 mg | Freq: Once | INTRAVENOUS | Status: AC
  Administered 2015-08-28: 2500 mg via INTRAVENOUS
  Filled 2015-08-28: qty 2500

## 2015-08-28 NOTE — Progress Notes (Addendum)
Pharmacy Antibiotic Note  Brad Mcgee is a 30 y.o. male admitted on 08/15/2015 slurred speech and L-sided weakness, was found to have ICH. Pt was started on broad spectrum antibiotics in the beginning of June, these were narrowed to Ancef - pt completed course of Ancef for fever of unknown origin on 6/8. Broad spectrum antibiotics are being restarted for presumed pneumonia due to fever and increased white cout. WBC 20, SCr 1, eCrCl > 100 ml/min, PCT wnl.  Plan: -Vancomycin 2500 mg IV x1 then 1g/8h -Zosyn 3.375 g IV q8h -Monitor renal fx, cultures, duration of therapy -Check VT at Css  Height: 5\' 7"  (170.2 cm) Weight: 279 lb 8.7 oz (126.8 kg) IBW/kg (Calculated) : 66.1  Temp (24hrs), Avg:99.3 F (37.4 C), Min:98.2 F (36.8 C), Max:100.3 F (37.9 C)   Recent Labs Lab 08/22/15 0526  08/24/15 0350 08/25/15 0256 08/26/15 0300 08/27/15 0356 08/28/15 0320  WBC 10.6*  --   --   --  14.1* 17.0* 21.0*  CREATININE  --   < > 0.93 1.04 0.99 0.93 1.02  < > = values in this interval not displayed.  Estimated Creatinine Clearance: 134.2 mL/min (by C-G formula based on Cr of 1.02).    Allergies  Allergen Reactions  . Bee Venom Anaphylaxis  . Coconut Oil Anaphylaxis  . Ancef [Cefazolin] Rash    Antimicrobials this admission: 6/3 vanc > 6/4 6/3 zosyn > 6/4 6/4 Ancef > 6/8 6/12 vanc >> 6/12 zosyn >>  Dose adjustments this admission: NA  Microbiology results: 6/2 BCx: sent 6/2 UCx: sent  6/ Sputum: sent  5/13 MRSA PCR: neg  Thank you for allowing pharmacy to be a part of this patient's care.  Baldemar FridayMasters, [name] 08/28/2015 11:08 AM

## 2015-08-28 NOTE — Progress Notes (Signed)
PULMONARY / CRITICAL CARE MEDICINE   Name: Brad Mcgee MRN: [id number] DOB: 05-17-1985    ADMISSION DATE:  08/15/2015 CONSULTATION DATE:  08/16/2015  REFERRING MD:  Dr. Amada Jupiter  CHIEF COMPLAINT:  Left side weakness  HISTORY OF PRESENT ILLNESS:  Hx from chart. 30 yo male with slurred speech and Lt sided weakness.  This was associated with headache.  He was intubated for airway protection.  He had CT head which showed 3.7 x 1.9 x 2 cm Rt basal ganglia ICH which then increased to 4.5 x 1.9 x 3.4 cm ICH on f/u CT head.  His BP was 187/106. Trach placed 6/7  SUBJECTIVE:  Awake and interactive, following commands on the right not left. Breathing better today, but still with a good amount of yellowish secretions. Tolerating 28% ATC well.   VITAL SIGNS: BP 109/59 mmHg  Pulse 103  Temp(Src) 100.3 F (37.9 C) (Axillary)  Resp 15  Ht  (1.702 m)  Wt 126.8 kg (279 lb 8.7 oz)  BMI 43.77 kg/m2  SpO2 94%  HEMODYNAMICS:    VENTILATOR SETTINGS: Vent Mode:  [-]  FiO2 (%):  [28 %] 28 %  INTAKE / OUTPUT: I/O last 3 completed shifts: In: 2985 [I.V.:150; NG/GT:2835] Out: 1450 [Urine:1350; Stool:100]  PHYSICAL EXAMINATION:  General:  Obese. No distress. Neuro:  Pupils symmetric. Moves R arm spont and to command.  Opens eyes with stimulation. Followscommands. L hemiplegia. Aware and alert HEENT: Trach in place. No scleral icterus or injection. Copious secretions, white/yellow Cardiovascular:  Regular rate & rhythm. No appreciable JVD given body habitus. Lungs:  Distant breath sounds. Symmetric chest rise on vent. Diminshed in bases Abdomen:  Soft. Protuberant. Normal bowel sounds. Integument:  Warm & dry. No rash on exposed skin.  LABS:  BMET  Recent Labs Lab 08/26/15 0300 08/27/15 0356 08/28/15 0320  NA 148* 145 145  K 3.6 3.8 4.1  CL 114* 113* 112*  CO2 BUN 33* 34* 41*  CREATININE 0.99 0.93 1.02  GLUCOSE 153* 163* 181*   Electrolytes  Recent Labs Lab  08/24/15 0350 08/25/15 0256 08/26/15 0300 08/27/15 0356 08/28/15 0320  CALCIUM 8.8* 8.9 9.0 9.2 9.1  MG 2.5* 2.4  --  2.4 2.4  PHOS 3.9  --   --  4.6 4.4    CBC  Recent Labs Lab 08/26/15 0300 08/27/15 0356 08/28/15 0320  WBC 14.1* 17.0* 21.0*  HGB 11.8* 13.3 12.9*  HCT 37.8* 42.5 41.0  PLT 256 257 292    Coag's  Recent Labs Lab 08/23/15 1100  INR 1.18    Sepsis Markers  Recent Labs Lab 08/27/15 1150 08/28/15 0320  PROCALCITON <0.10 <0.10    ABG No results for input(s): PHART, PCO2ART, PO2ART in the last 168 hours.  Liver Enzymes  Recent Labs Lab 08/22/15 0500  ALBUMIN 2.5*    Cardiac Enzymes No results for input(s): TROPONINI, PROBNP in the last 168 hours.  Glucose  Recent Labs Lab 08/27/15 1150 08/27/15 1543 08/27/15 1953 08/27/15 2338 08/28/15 0334 08/28/15 0755  GLUCAP 171* 156* 142* 158* 194* 149*    Imaging No results found.  STUDIES:  5/30 CT Head: 3.7 x 1.9 x 2 cm Rt basal ganglia ICH 5/31 CT Head:  No significant change. 5.2x1.9x3.0cm right basal ganglia hematoma. Mild mass effect. 5/31 TTE: Severe concentric LVH with EF 50-55%. No regional wall motion abnormalities. Grade 2 diastolic dysfunction. LA & RA normal in size. RV normal in size and function. No aortic stenosis  or regurgitation. Aortic root normal in size. Trivial mitral regurgitation without stenosis. No pulmonic regurgitation or stenosis. No tricuspid regurgitation. No pericardial effusion. 6/01 Port CXR:  Increased bilateral interstitial markings. Unable to appreciate pleural effusion. ETT & CVL in stable position.  MICROBIOLOGY: MRSA PCR 5/31:  Negative  Sputum 6/2 GPC>>MSSA Blood 6/2>> neg  6/11 Blood Cx >>> 6/11 Urine cx >>> 6/11 Tracheal Asp >>>  ANTIBIOTICS: 6/3 vanc>>6/4 6/3 zoysn>> 6/4 6/4 cefazolin >> 6/7  6/12 zosyn > 6/12 Vancomycin >  SIGNIFICANT EVENTS: 5/30 Admit 5/31 Start 3% NS>> 6/7   LINES/TUBES: OETT 7.5 5/31 >> 6/7 R IJ CVL 5/31  >> OGT 5/31 >> FOLEY 5/31 >> PIV x3  ASSESSMENT / PLAN:  NEUROLOGIC A:   R Basal Ganglia ICH / CVA  P:   Management per Neurology RASS goal: 0 Fentanyl IV prn [place] team primary  PULMONARY A: VDRF s/p trach now on ATC with copious secretions Compromised Airway - Secondary to ICH.  Acute on chronic Diastolic CHF & 3% NaCl High risk for OSA   P:   TC 24/7 28% SLP eval for PMV and swallow today Would need CPAP if / when decannulated Pan cultured, check PCT and hold off abx for now. Consider 3% saline nebs  CARDIOVASCULAR A:  Hypertensive Emergency Acute on Chronic Diastolic CHF - Likely secondary to 3% HS. Elevated Troponin I - Likely demand ischemia. No wall motion abnormalities on TTE. Grade 2 Diastolic CHF   P:  Telemetry Off Cardene to keep SBP < 160 Labetalol IV prn Lisinopril 20mg  QD, metoprolol 25 bid, Maxide-37.5 / 25 QD Holding diuresis  RENAL  Recent Labs Lab 08/26/15 0300 08/27/15 0356 08/28/15 0320  NA 148* 145 145   A:   Hypernatremia - Medically induced. Hyperchloremia - Medically induced.  P:   No 3% Trending electrolytes & renal function daily Following UOP with Foley Replace electrolytes as indicated  GASTROINTESTINAL A:   Nutrition.  P:   NGT out, will need SLP eval for cuff down/pmv/diet. If fails will need NGT replaced. Pepcid VT qhs NPO for now  HEMATOLOGIC Wbc 11.2->13.1->21.3 A:   Anemia - Mild. Leukocytosis - Mild.  P:  Trending cell counts w/ CBC SCDs for prophylaxis On enoxaparin for DVT prophylaxis given stability of Head Ct   INFECTIOUS A:   FUO - possibly secondary to ICH.  MSSA bronchitis vs PNA P:   Recultured 6/11  Repeat PCT PCXR Will restart broad spectrum ABX, although PCT neg. Will want to see cultures.  ENDOCRINE CBG (last 3)   Recent Labs  08/27/15 2338 08/28/15 0334 08/28/15 0755  GLUCAP 158* 194* 149*   A:   No acute issues.    P:   Monitor blood sugar on daily  labs.  FAMILY UPDATES:  Mother updated bedside.   Joneen RoachPaul Hoffman, AGACNP-BC [facility] Pager [phone] or [phone]  08/28/2015 10:45 AM     ATTENDING NOTE / ATTESTATION NOTE :   I have discussed the case with the resident/APP Joneen RoachPaul Hoffman.   I agree with the resident/APP's  history, physical examination, assessment, and plans.  I have edited the above note and modified it according to our agreed history, physical examination, assessment and plan.   Family :Family updated at length today.  Father updated on son's progress. Try to wean off O2.  Keep sats > =92%. Cont present meds.   Pollie MeyerJ. Angelo A de Dios, MD 08/28/2015, 5:48 PM [place] [facility] Pager [phone]  1310 After 3 pm or if no answer, call [phone]

## 2015-08-28 NOTE — Progress Notes (Signed)
STROKE TEAM PROGRESS NOTE   SUBJECTIVE (INTERVAL HISTORY) Dad and brother at the bedside.  Discussed patient.  No events overnight.  Tolerating trach collar.  Patient pulled out panda tube.  ROS completed and patient had no complaints   OBJECTIVE Temp:  [98.2 F (36.8 C)-100.3 F (37.9 C)] 99.5 F (37.5 C) (06/12 1148) Pulse Rate:  [88-109] 102 (06/12 1400) Cardiac Rhythm:  [-] Normal sinus rhythm (06/12 0830) Resp:  [15-27] 23 (06/12 1400) BP: (95-132)/(48-88) 123/70 mmHg (06/12 1400) SpO2:  [94 %-100 %] 99 % (06/12 1400) FiO2 (%):  [28 %] 28 % (06/12 1302) Weight:  [279 lb 8.7 oz (126.8 kg)] 279 lb 8.7 oz (126.8 kg) (06/12 0335)  CBC:   Recent Labs Lab 08/27/15 0356 08/28/15 0320  WBC 17.0* 21.0*  HGB 13.3 12.9*  HCT 42.5 41.0  MCV 100.0 99.5  PLT 257 292    Basic Metabolic Panel:   Recent Labs Lab 08/27/15 0356 08/28/15 0320  NA 145 145  K 3.8 4.1  CL 113* 112*  CO2 25 26  GLUCOSE 163* 181*  BUN 34* 41*  CREATININE 0.93 1.02  CALCIUM 9.2 9.1  MG 2.4 2.4  PHOS 4.6 4.4    Lipid Panel:     Component Value Date/Time   TRIG 331* 08/22/2015 0526   HgbA1c: No results found for: HGBA1C Urine Drug Screen:     Component Value Date/Time   LABOPIA NONE DETECTED 08/16/2015 0033   COCAINSCRNUR NONE DETECTED 08/16/2015 0033   LABBENZ NONE DETECTED 08/16/2015 0033   AMPHETMU NONE DETECTED 08/16/2015 0033   THCU NONE DETECTED 08/16/2015 0033   LABBARB NONE DETECTED 08/16/2015 0033     IMAGING  Ct Head Wo Contrast 08/20/2015  ADDENDUM: There is herniation of the cerebellar tonsils through the foramen magnum. This is stable when compared to previous studies and likely reflects Chiari malformation although herniation from mass-effect cannot be completely excluded. 08/20/2015  Stable right basal ganglia hemorrhage with mass-effect and slight midline shift.  08/16/2015   No significant change since the previous study. Right basal ganglia hematoma volume 15.4 cc.  Slightly better defined surrounding edema.  08/16/2015   Increased size of acute right basal ganglia hemorrhage, with increased mass effect. There is now 2 mm right to left midline shift. There is developing supratentorial sulcal effacement and persistent basilar cistern effacement concerning for developing cerebral edema.  08/15/2015  Acute right basal ganglia hemorrhage measuring 3.7 x 1.9 x 2.0 cm. Mild surrounding edema and mass effect, no midline shift. There is crowding of the basilar cisterns.   Ct Angio Head & Neck W/cm &/or Wo/cm 08/18/2015  1. Negative CTA of the head and neck. Please note that evaluation is somewhat limited by body habitus and timing of the contrast bolus. 2. Slight interval increase in size of acute right basal ganglia parenchymal hemorrhage, now measuring approximately 16 cc in estimated volume, previously 15.4 cc. Slightly increased and more defined surrounding vasogenic edema. Similar 2 mm right-to-left shift. Supratentorial sulcal effacement concerning for cerebral edema, similar to prior.   Dg Chest Port 1 View 08/26/2015 pending  08/24/2015 Slight interval improvement in left lower lobe atelectasis or pneumonia. Stable cardiomegaly and stable mild interstitial edema. The support tubes are in reasonable position.  08/23/2015 Stable support apparatus. Cardiomegaly. Central mild vascular congestion without pulmonary edema. Left basilar atelectasis. 08/21/2015   Cardiomegaly with vascular congestion. Improving interstitial edema.  08/20/2015  Mild interstitial edema/CHF.  08/17/2015   1. Lines and tubes in stable position .  2. Cardiomegaly with increased interstitial markings consistent with congestive heart failure. Small left pleural effusion cannot be excluded.  08/16/2015  1. Tip of the right central line in the mid SVC.  No pneumothorax. 2. Endotracheal and enteric tubes remain in place. 3. Unchanged enlargement of the cardiac silhouette. Decreased vascular congestion and edema  from prior.  08/16/2015   1. Endotracheal tube seen ending 2-3 cm above the carina. 2. Lungs mildly hypoexpanded. Vascular congestion noted. Increased interstitial markings raise concern for mild pulmonary edema, though pneumonia could have a similar appearance.   Dg Abd Portable 1v 08/16/2015  No acute findings per Enteric tube with tip in the right upper quadrant likely over the distal stomach or proximal duodenum.   2D Echocardiogram  Left ventricle: The cavity size was normal. There was severe concentric hypertrophy. Systolic function was normal. The estimated ejection fraction was in the range of 50% to 55%. Wall motion was normal; there were no regional wall motion abnormalities. Features are consistent with a pseudonormal left ventricular filling pattern, with concomitant abnormal relaxation and increased filling pressure (grade 2 diastolic dysfunction). Doppler parameters are consistent with high ventricular filling pressure.   PHYSICAL EXAM; Exam is stable today, no changes GENERAL- trach in place, on trach collar, off sedation, appears stated age, not in acute distress, Obese. HEENT- Atraumatic, normocephalic, Pupils reactive and equal CARDIAC- regular rate and rhythm, no murmurs, rubs or gallops PULM:  Decreased breath sounds at bases ABD:  Obese, soft NT EXTREM:  No C/C/E  NEUROLOGICAL EXAM : trach in place, on trach collar, off sedation, eye open, able to follow commands on the right side. Nods yes/no. Eyes open able to communicate using right hand  Pupils 2 mm reactive. Fundi not visualized. Eyes mid position, slightly dysconjugate gaze with mild exotropia left eye and difficulty with crossing the midline on the left. Eye closure intact but stronger on the right, left facial droop, tongue in mid position  LLE and LUE 0/5 and no spontaneous movement, RUE and RLE 4/5   Sensation:  Patient indicates no difference  Coordination and gait not tested.   ASSESSMENT/PLAN Brad Mcgee is a 30 y.o. male with history of hypertension presenting with L sided weakness. CT showed a L basal ganglia hemorrhage, with change in mental status, pt had repeat Ct head which showed mildly increased size of hmg, and increased mass effect. Pt and was subsequently intubated with difficulty weaning, s/p trach.  Stroke:  Right BG ICH hemorrhage secondary to hypertension   Resultant  L hemiplegia, VDRF  Initial CT R BG hemorrhage 3.7x1.9x2, mild edema & mass effect  Repeat CT with neuro worsening increased size of R BG hmg w/ increased mass effect and cerebral edema.   Repeat CT same.  Stable hematoma. Unchanged shift or edema.  CTA head & neck unremarkable  2D Echo  EF 50-55%. No source of embolus   SCDs and lovenox for VTE prophylaxis  Diet NPO time specified, on tube feeds.  No antithrombotic prior to admission, not on antithrombotics due to ICH  Ongoing aggressive stroke risk factor management  Therapy recommendations:  CIR. Admissions coordinator following  Disposition:  pending   Acute respiratory failure MSSA bronchitis vs PNA  Intubated in the ED  CCM managing vent, Off propofol and fentanyl this am  MSSA in sputum. abx narrowed from zosyn to ancef  Did not tolerate wean  Trach placement by CCM 08/23/2015  If unable to wean, ? Consider LTACH  Critical Care  team to trial trach collar and determine if able to tolerate overnight without mechanical ventilation  Dysphagia  Diet NPO time specified   On tube feedings  Will need to discuss PEG if patient unable to pass evaluation with speaking valve in place   Medically induced hypernatremia for Cerebral Edema control  3% off   NA slowing drifting down, 148 today  Rash - allergic  During last week, previous discussion with inpt pharmacy and ancef was considered as most likely culprit, metoprolol less likely. Pt was on lisinopril at home  Thus Ancef discontinued since Abx treatment had been 6 days  now   Hypertensive Emergency  BP 182/124 on arrival in setting of acute neurologic symptoms  BP goal < 160   Treated with cardene, now off  metoprolol, lisinopril, triamterene-HCTZ 37.2/25   BP stabilizing   Other Stroke Risk Factors  Morbid Obesity, Body mass index is 43.77 kg/(m^2).   Acute on chronic diastolic CHF, grade 2 chronic CHF  High risk OSA. OP eval recommended  Other Active Problems  Anemia, mild  Leukocytosis, worsened.  WBC was normalized; however, yesterday 17 and now 21 Restart vanc and zosyn for .  MSSA in sputum  Hypokalemia, improved today  Loose stools that are c.dif negative.  Improving with Imodium  Hospital day # 12    CRITICAL CARE NEUROLOGY ATTENDING NOTE Patient was seen and examined by me personally. I independently viewed imaging studies, participated in medical decision making and plan of care. The laboratory and radiographic studies were personally reviewed by me.  ROS completed by me personally and pertinent positives fully assessed.  Patient denies complaints by nodding and shaking head.  Assessment and plan completed by me personally.  Today:  Patient can transfer to step-down  Restart Vanc and zosyn per pharmacy  Speech therapy to consider swallow eval   Transfer to rehab in few days  Condition is unchanged    Total time spent: 35 minutes   SIGNED BY: Dr. Delia HeadyPramod Sethi, MD

## 2015-08-28 NOTE — Progress Notes (Signed)
I met with pt's Dad at bedside. We discussed eventual admission to inpt rehab pending medical readiness and insurance approval. Noted WBC, temp and medical plan of care. [phone]

## 2015-08-28 NOTE — Progress Notes (Signed)
Physical Therapy Treatment Patient Details Name: Brad MaxwellShawon M Mcgee MRN: [id number] DOB: 04/24/1985 Today's Date: 08/28/2015    History of Present Illness 30 yo male with slurred speech and Lt sided weakness. This was associated with headache. He was intubated for airway protection. He had CT head which showed 3.7 x 1.9 x 2 cm Rt basal ganglia ICH which then increased to 4.5 x 1.9 x 3.4 cm ICH with herniation of cerebellar tonsils due to mass effect vs chiari malformation on f/u CT head. S/p trach on 08/23/15    PT Comments    Patient overall did very well tolerating 50 minute co-treat session between PT, OT, and SLP (working on PMV while upright at EOB). Patient with very strong cough while wearing PMV with noted associated reactions of LUE and lt abdominals. Patient beginning to show tendencies to push with his RUE, however fairly easy to distract/redirect RUE. Trace movements noted in LLE.    Follow Up Recommendations  CIR;Supervision/Assistance - 24 hour     Equipment Recommendations  Other (comment) (TBD)    Recommendations for Other Services       Precautions / Restrictions Precautions Precautions: Fall Precaution Comments: watch BP, and O2 saturations on ATC Restrictions Weight Bearing Restrictions: No    Mobility  Bed Mobility Overal bed mobility: Needs Assistance;+2 for physical assistance;+ 2 for safety/equipment Bed Mobility: Supine to Sit;Rolling;Sit to Sidelying Rolling: Total assist;+2 for physical assistance (to the right)   Supine to sit: Max assist;+2 for physical assistance;+2 for safety/equipment;HOB elevated   Sit to sidelying: Max assist General bed mobility comments: +2 max to come to EOB, able to move RLE off the bed and use RUE to push to sitting; sit to side pt able to initiate and control descent of upper body with min assist; max assist to raise legs  Transfers                 General transfer comment: not attempted; focus on sitting balance  during visual and PMV assessments  Ambulation/Gait                 Stairs            Wheelchair Mobility    Modified Rankin (Stroke Patients Only) Modified Rankin (Stroke Patients Only) Pre-Morbid Rankin Score: No symptoms Modified Rankin: Severe disability     Balance   Sitting-balance support: No upper extremity supported;Feet supported Sitting balance-Leahy Scale: Zero Sitting balance - Comments: if RUE on bed or rail, he begins pushing himself to the left; with Rt hand in his lap, he maintained sitting EOB x 20+ minutes with assist varying max to min. Demonstrated no righting reactions when loses balance posteriorly, and able to assist with pulling himself forward when cued Postural control: Posterior lean;Left lateral lean                          Cognition Arousal/Alertness: Awake/alert Behavior During Therapy: Flat affect Overall Cognitive Status: Difficult to assess (although writing sentences and seems relatively intact)         Following Commands: Follows one step commands consistently            Exercises Other Exercises Other Exercises: Trace Lt knee extension activation in sitting; 1+ Lt knee flexion in sitting Other Exercises: strong associated reactions in LUE with strong coughing Other Exercises: Trunk control dynamic activities using abdominals and RUE to pull himself forward    General Comments General comments (skin integrity,  edema, etc.): Reported spinning type dizziness throughout session. less when he closes his eyes, but overall constant. ? due to incr pressure on cerebellar tonsils?      Pertinent Vitals/Pain SaO2 99-100% on trach collar with PMV (supervised by SLP) RR 22-30 (with coughing) HR 105-125 bpm   Pain Assessment: No/denies pain    Home Living                      Prior Function            PT Goals (current goals can now be found in the care plan section) Acute Rehab PT Goals Patient  Stated Goal: none stated Time For Goal Achievement: 09/07/15 Progress towards PT goals: Progressing toward goals    Frequency  Min 4X/week    PT Plan Current plan remains appropriate    Co-evaluation PT/OT/SLP Co-Evaluation/Treatment: Yes Reason for Co-Treatment: Complexity of the patient's impairments (multi-system involvement);For patient/therapist safety     SLP goals addressed during session: Other (comment) (PMSV placement)   End of Session Equipment Utilized During Treatment: Oxygen (ATC) Activity Tolerance: Patient limited by fatigue Patient left: in bed;with call bell/phone within reach;with bed alarm set;with SCD's reapplied     Time: 4098-1191 PT Time Calculation (min) (ACUTE ONLY): 50 min  Charges:  $Therapeutic Activity: 8-22 mins                    G Codes:      Brad Mcgee 2015-09-04, 5:06 PM Pager [phone]

## 2015-08-28 NOTE — Progress Notes (Signed)
Occupational Therapy Treatment Patient Details Name: Brad MaxwellShawon Mcgee An MRN: [id number] DOB: 11/11/1985 Today's Date: 08/28/2015    History of present illness 30 yo male with slurred speech and Lt sided weakness. This was associated with headache. He was intubated for airway protection. He had CT head which showed 3.7 x 1.9 x 2 cm Rt basal ganglia ICH which then increased to 4.5 x 1.9 x 3.4 cm ICH with herniation of cerebellar tonsils due to mass effect vs chiari malformation on f/u CT head. S/p trach on 08/23/15   OT comments  Pt tolerated EOB sittign x 50 mins working on trunk control.  Able to initiate Lt UE movement.   Max to min a to sit EOB.  Continue to recommend CIR.   Follow Up Recommendations  CIR;Supervision/Assistance - 24 hour    Equipment Recommendations  Other (comment)    Recommendations for Other Services Rehab consult    Precautions / Restrictions Precautions Precautions: Fall Precaution Comments: watch BP, and O2 saturations on ATC Restrictions Weight Bearing Restrictions: No       Mobility Bed Mobility Overal bed mobility: Needs Assistance;+2 for physical assistance;+ 2 for safety/equipment Bed Mobility: Supine to Sit;Rolling;Sit to Sidelying Rolling: Total assist;+2 for physical assistance (to the right)   Supine to sit: Max assist;+2 for physical assistance;+2 for safety/equipment;HOB elevated   Sit to sidelying: Max assist General bed mobility comments: +2 max to come to EOB, able to move RLE off the bed and use RUE to push to sitting; sit to side pt able to initiate and control descent of upper body with min assist; max assist to raise legs  Transfers                 General transfer comment: not attempted; focus on sitting balance during visual and PMV assessments    Balance   Sitting-balance support: Feet supported;No upper extremity supported Sitting balance-Leahy Scale: Zero Sitting balance - Comments: if RUE on bed or rail, he begins  pushing himself to the left; with Rt hand in his lap, he maintained sitting EOB x 20+ minutes with assist varying max to min. Demonstrated no righting reactions when loses balance posteriorly, and able to assist with pulling himself forward when cued Postural control: Posterior lean                         ADL Overall ADL's : Needs assistance/impaired     Grooming: Wash/dry face;Moderate assistance;Sitting                                 General ADL Comments: Pt able to write sentences to communicate       Vision   Alignment/Gaze Preference: Head turned             Additional Comments: pt reports diizziness when sitting - reports spinning sensation.   Pt with horizontal nystagmus with Rt gaze, ? oscillopsia with Lt gaze.  He indicates intermittent diplopia often closing one eye.  Disucssed occlusion with pt and father.  Family will bring in pt's glasses.     Perception     Praxis      Cognition   Behavior During Therapy: Flat affect Overall Cognitive Status: Difficult to assess (although writing sentences and seems relatively intact)          Following Commands: Follows one step commands consistently  Extremity/Trunk Assessment               Exercises Other Exercises Other Exercises: facilitation of Lt UE.  He is able to initiate shoulder flexion with elbow extension with closed chain activities.  Trace finger flexion noted.  Other Exercises: strong associated reactions in LUE with strong coughing Other Exercises: Trunk control dynamic activities using abdominals and RUE to pull himself forward   Shoulder Instructions       General Comments      Pertinent Vitals/ Pain       Pain Assessment: No/denies pain  Home Living                                          Prior Functioning/Environment              Frequency Min 3X/week     Progress Toward Goals  OT Goals(current goals can now be  found in the care plan section)     Acute Rehab OT Goals Patient Stated Goal: none stated ADL Goals Pt Will Perform Grooming: with min assist;sitting Pt Will Perform Upper Body Bathing: with mod assist;sitting Pt Will Perform Lower Body Bathing: with mod assist;sit to/from stand Pt Will Transfer to Toilet: with max assist;bedside commode;stand pivot transfer Additional ADL Goal #1: Pt will sit EOB for 5 minutes with min guard assist as precursor for ADLs. Additional ADL Goal #2: Pts family will independently demonstrate proper positioning of UEs with min verbal cues.  Plan Discharge plan remains appropriate    Co-evaluation    PT/OT/SLP Co-Evaluation/Treatment: Yes Reason for Co-Treatment: Complexity of the patient's impairments (multi-system involvement);For patient/therapist safety   OT goals addressed during session: Strengthening/ROM      End of Session Equipment Utilized During Treatment: Oxygen   Activity Tolerance Patient tolerated treatment well   Patient Left in bed;with call bell/phone within reach;with bed alarm set   Nurse Communication Mobility status        Time:  1359- 1450 51 minutes    Charges:  1 procedure 1 neuromuscular reeducation  Brad Mcgee, Brad Mcgee 08/28/2015, 8:47 PM

## 2015-08-28 NOTE — Progress Notes (Signed)
Speech Language Pathology Treatment: Brad Mcgee Speaking valve  Patient Details Name: Brad Mcgee MRN: [id number] DOB: 11/27/1985 Today's Date: 08/28/2015 Time: 1420-1450 SLP Time Calculation (min) (ACUTE ONLY): 30 min  Assessment / Plan / Recommendation Clinical Impression  ST follow up for PMSV therapy.  The patient was seen while working with PT and OT while sitting on the side of the bed working on balance and strength exercises.  Cuff was deflated when ST entered the room.  The patient required tracheal suctioning prior to PMSV placement.  Attempts were made for the patient to expectorate on his own without the PMSV in place but he was not successful.  After suctioning the PMSV was put in place and the patient was able to tolerate if for at least 22 minutes.  Moderate strong coughing was noted that at time caused the PMSV to dislodge.  The patient was noted to orally expectorate with the PMSV in place.  Vitals were stable.  O2 levels remained 98 and above, heart rate was in the 110s and respiratory rate was in the mid 20's during placement.  The patient reported no additional work of breathing with the PMSV in place.  Attempts were made to phonate and vocal volume was extremely low and hard to understand.  The patient attempted to improve his breath support for speech several times but was not successful in phonating clearly.  The patient at this time is asking for water and his DHT has become dislodged.  Given how well he tolerated the placement of the PMSV consider swallow evaluation next date.  ST will continue to follow.     HPI HPI: 30 yo male with slurred speech and Lt sided weakness and headache. Intubated 5/30, trach 6/7. CT head which showed 3.7 x 1.9 x 2 cm Rt basal ganglia ICH which then increased to 4.5 x 1.9 x 3.4 cm ICH on f/u CT head.       SLP Plan  Continue with current plan of care     Recommendations         Patient may use Passy-Mcgee Speech Valve: with SLP  only;Intermittently with supervision of nurse only PMSV Supervision: Full MD: Please consider changing trach tube to : Cuffless      Oral Care Recommendations: Oral care QID Follow up Recommendations: Inpatient Rehab Plan: Continue with current plan of care     GO               Dimas AguasMelissa [place] Searles, MA, CCC-SLP Acute Rehab SLP [phone] Fleet ContrasGoodman, [name] 08/28/2015, 3:08 PM

## 2015-08-29 ENCOUNTER — Inpatient Hospital Stay (HOSPITAL_COMMUNITY): Payer: 59

## 2015-08-29 DIAGNOSIS — N390 Urinary tract infection, site not specified: Secondary | ICD-10-CM

## 2015-08-29 LAB — BASIC METABOLIC PANEL
Anion gap: 7 (ref 5–15)
BUN: 44 mg/dL — ABNORMAL HIGH (ref 6–20)
CO2: 24 mmol/L (ref 22–32)
Calcium: 9 mg/dL (ref 8.9–10.3)
Chloride: 112 mmol/L — ABNORMAL HIGH (ref 101–111)
Creatinine, Ser: 1.17 mg/dL (ref 0.61–1.24)
GFR calc Af Amer: >60 mL/min (ref >60)
GFR calc non Af Amer: >60 mL/min (ref >60)
Glucose, Bld: 142 mg/dL — ABNORMAL HIGH (ref 65–99)
Potassium: 3.9 mmol/L (ref 3.5–5.1)
Sodium: 143 mmol/L (ref 135–145)

## 2015-08-29 LAB — CBC
HCT: 39.4 % (ref 39.0–52.0)
Hemoglobin: 12.4 g/dL — ABNORMAL LOW (ref 13.0–17.0)
MCH: 31.2 pg (ref 26.0–34.0)
MCHC: 31.5 g/dL (ref 30.0–36.0)
MCV: 99.2 fL (ref 78.0–100.0)
Platelets: 292 10*3/uL (ref 150–400)
RBC: 3.97 MIL/uL — ABNORMAL LOW (ref 4.22–5.81)
RDW: 13.1 % (ref 11.5–15.5)
WBC: 18.2 10*3/uL — ABNORMAL HIGH (ref 4.0–10.5)

## 2015-08-29 LAB — GLUCOSE, CAPILLARY
Glucose-Capillary: 115 mg/dL — ABNORMAL HIGH (ref 65–99)
Glucose-Capillary: 129 mg/dL — ABNORMAL HIGH (ref 65–99)
Glucose-Capillary: 134 mg/dL — ABNORMAL HIGH (ref 65–99)
Glucose-Capillary: 145 mg/dL — ABNORMAL HIGH (ref 65–99)
Glucose-Capillary: 148 mg/dL — ABNORMAL HIGH (ref 65–99)
Glucose-Capillary: 173 mg/dL — ABNORMAL HIGH (ref 65–99)

## 2015-08-29 LAB — CULTURE, RESPIRATORY W GRAM STAIN

## 2015-08-29 LAB — CULTURE, RESPIRATORY

## 2015-08-29 LAB — PROCALCITONIN: Procalcitonin: <0.1 ng/mL

## 2015-08-29 MED ORDER — RESOURCE THICKENUP CLEAR PO POWD
ORAL | Status: DC | PRN
  Filled 2015-08-29: qty 125

## 2015-08-29 MED ORDER — METOPROLOL TARTRATE 5 MG/5ML IV SOLN: 25.0000 mg | Freq: Once | INTRAVENOUS | Status: DC

## 2015-08-29 MED ORDER — METOPROLOL TARTRATE 5 MG/5ML IV SOLN
5.0000 mg | Freq: Once | INTRAVENOUS | Status: AC
  Administered 2015-08-29: 2.5 mg via INTRAVENOUS
  Filled 2015-08-29: qty 5

## 2015-08-29 NOTE — Progress Notes (Signed)
MBSS complete. Full report located under chart review in imaging section.  

## 2015-08-29 NOTE — Progress Notes (Signed)
PULMONARY / CRITICAL CARE MEDICINE   Name: Brad Mcgee MRN: [id number] DOB: April 08, 1985    ADMISSION DATE:  08/15/2015 CONSULTATION DATE:  08/16/2015  REFERRING MD:  Dr. Amada Jupiter  CHIEF COMPLAINT:  Left side weakness  HISTORY OF PRESENT ILLNESS:  Hx from chart. 30 yo male with slurred speech and Lt sided weakness.  This was associated with headache.  He was intubated for airway protection.  He had CT head which showed 3.7 x 1.9 x 2 cm Rt basal ganglia ICH which then increased to 4.5 x 1.9 x 3.4 cm ICH on f/u CT head.  His BP was 187/106. Trach placed 6/7  SUBJECTIVE:  Awake and interactive, following commands on the right not left. Tolerating TC well.  No events overnight.  VITAL SIGNS: BP 126/74 mmHg  Pulse 100  Temp(Src) 100.1 F (37.8 C) (Axillary)  Resp 18  Ht  (1.702 m)  Wt 123.3 kg (271 lb 13.2 oz)  BMI 42.56 kg/m2  SpO2 99%  HEMODYNAMICS:    VENTILATOR SETTINGS: Vent Mode:  [-]  FiO2 (%):  [28 %] 28 %  INTAKE / OUTPUT: I/O last 3 completed shifts: In: 2315.8 [I.V.:615.8; NG/GT:900; IV Piggyback:800] Out: 1925 [Urine:1925]  PHYSICAL EXAMINATION:  General:  Obese. No distress. Neuro:  Pupils symmetric. Moves R arm spont and to command.  Opens eyes with stimulation. Followscommands. L hemiplegia. Aware and alert HEENT: Trach in place. No scleral icterus or injection. Copious secretions, white/yellow Cardiovascular:  Regular rate & rhythm. No appreciable JVD given body habitus. Lungs:  Distant breath sounds. Symmetric chest rise on vent. Diminshed in bases Abdomen:  Soft. Protuberant. Normal bowel sounds. Integument:  Warm & dry. No rash on exposed skin.  LABS:  BMET  Recent Labs Lab 08/27/15 0356 08/28/15 0320 08/29/15 0158  NA 145 145 143  K 3.8 4.1 3.9  CL 113* 112* 112*  CO2 BUN 34* 41* 44*  CREATININE 0.93 1.02 1.17  GLUCOSE 163* 181* 142*   Electrolytes  Recent Labs Lab 08/24/15 0350 08/25/15 0256  08/27/15 0356  08/28/15 0320 08/29/15 0158  CALCIUM 8.8* 8.9  < > 9.2 9.1 9.0  MG 2.5* 2.4  --  2.4 2.4  --   PHOS 3.9  --   --  4.6 4.4  --   < > = values in this interval not displayed.  CBC  Recent Labs Lab 08/27/15 0356 08/28/15 0320 08/29/15 0158  WBC 17.0* 21.0* 18.2*  HGB 13.3 12.9* 12.4*  HCT 42.5 41.0 39.4  PLT 257 292 292    Coag's  Recent Labs Lab 08/23/15 1100  INR 1.18    Sepsis Markers  Recent Labs Lab 08/27/15 1150 08/28/15 0320 08/29/15 0158  PROCALCITON <0.10 <0.10 <0.10    ABG No results for input(s): PHART, PCO2ART, PO2ART in the last 168 hours.  Liver Enzymes No results for input(s): AST, ALT, ALKPHOS, BILITOT, ALBUMIN in the last 168 hours.  Cardiac Enzymes No results for input(s): TROPONINI, PROBNP in the last 168 hours.  Glucose  Recent Labs Lab 08/28/15 1702 08/28/15 1956 08/28/15 2344 08/29/15 0445 08/29/15 0830 08/29/15 1128  GLUCAP 118* 128* 145* 134* 148* 129*    Imaging No results found.  STUDIES:  5/30 CT Head: 3.7 x 1.9 x 2 cm Rt basal ganglia ICH 5/31 CT Head:  No significant change. 5.2x1.9x3.0cm right basal ganglia hematoma. Mild mass effect. 5/31 TTE: Severe concentric LVH with EF 50-55%. No regional wall motion abnormalities. Grade 2 diastolic dysfunction.  LA & RA normal in size. RV normal in size and function. No aortic stenosis or regurgitation. Aortic root normal in size. Trivial mitral regurgitation without stenosis. No pulmonic regurgitation or stenosis. No tricuspid regurgitation. No pericardial effusion. 6/01 Port CXR:  Increased bilateral interstitial markings. Unable to appreciate pleural effusion. ETT & CVL in stable position.  MICROBIOLOGY: MRSA PCR 5/31:  Negative  Sputum 6/2 GPC>>MSSA Blood 6/2>> neg  6/11 Blood Cx >>>NTD 6/11 Urine cx >>>GNR 6/11 Tracheal Asp >>>NTD  ANTIBIOTICS: 6/3 vanc>>6/4 6/3 zoysn>> 6/4 6/4 cefazolin >> 6/7  6/12 zosyn > 6/12 Vancomycin >  SIGNIFICANT EVENTS: 5/30  Admit 5/31 Start 3% NS>> 6/7   LINES/TUBES: OETT 7.5 5/31 >> 6/7 R IJ CVL 5/31 >> OGT 5/31 >> FOLEY 5/31 >> PIV x3  ASSESSMENT / PLAN:  NEUROLOGIC A:   R Basal Ganglia ICH / CVA  P:   Management per Neurology RASS goal: 0 Fentanyl IV prn [place] team primary  PULMONARY A: VDRF s/p trach now on ATC with copious secretions Compromised Airway - Secondary to ICH.  Acute on chronic Diastolic CHF & 3% NaCl High risk for OSA   P:   TC 24/7 28% SLP eval for PMV and swallow today Would need CPAP if / when decannulated Pan cultured, check PCT and hold off abx for now. If continues to tolerate TC by AM then will change to cuffless 6 towards the end of the week specially since passed swallow eval. Consult speech for PMV.  CARDIOVASCULAR A:  Hypertensive Emergency Acute on Chronic Diastolic CHF - Likely secondary to 3% HS. Elevated Troponin I - Likely demand ischemia. No wall motion abnormalities on TTE. Grade 2 Diastolic CHF   P:  Telemetry Off Cardene to keep SBP < 160 Lisinopril 20mg  QD, metoprolol 25 bid, Maxide-37.5 / 25 QD Holding diuresis  RENAL  Recent Labs Lab 08/27/15 0356 08/28/15 0320 08/29/15 0158  NA 145 145 143   A:   Hypernatremia - Medically induced. Hyperchloremia - Medically induced.  P:   Trending electrolytes & renal function daily Following UOP with Foley Replace electrolytes as indicated  GASTROINTESTINAL A:   Nutrition.  P:   Pepcid VT qhs Diet per SLP recommendations.  HEMATOLOGIC Wbc 11.2->13.1->21.3 A:   Anemia - Mild. Leukocytosis - Mild.  P:  Trending cell counts w/ CBC SCDs for prophylaxis On enoxaparin for DVT prophylaxis given stability of Head Ct  INFECTIOUS A:   FUO - possibly secondary to ICH.  MSSA bronchitis vs PNA GNR in urine. P:   Recultured 6/11  Repeat PCT PCXR Urine with GNR, will d/c vanc.  ENDOCRINE CBG (last 3)   Recent Labs  08/29/15 0445 08/29/15 0830 08/29/15 1128  GLUCAP 134*  148* 129*   A:   No acute issues.    P:   Monitor blood sugar on daily labs.  FAMILY UPDATES:  Patient updated bedside.  Alyson ReedyWesam G. Adley Mazurowski, M.D. [facility]/Critical [facility]. Pager: [phone]. After hours pager: [phone].

## 2015-08-29 NOTE — Progress Notes (Signed)
STROKE TEAM PROGRESS NOTE   SUBJECTIVE (INTERVAL HISTORY) Dad   at the bedside.  Discussed patient.  No events overnight.  Tolerating trach collar.  Patient  Scheduled for swallow eval today. He is tolerating his down sized trach  OBJECTIVE Temp:  [97.3 F (36.3 C)-100.1 F (37.8 C)] 100.1 F (37.8 C) (06/13 1248) Pulse Rate:  [94-110] 101 (06/13 1531) Cardiac Rhythm:  [-] Sinus tachycardia (06/13 1515) Resp:  [16-32] 16 (06/13 1531) BP: (89-132)/(49-81) 126/74 mmHg (06/13 1131) SpO2:  [98 %-100 %] 100 % (06/13 1531) FiO2 (%):  [28 %] 28 % (06/13 1531) Weight:  [271 lb 13.2 oz (123.3 kg)] 271 lb 13.2 oz (123.3 kg) (06/13 0513)  CBC:   Recent Labs Lab 08/28/15 0320 08/29/15 0158  WBC 21.0* 18.2*  HGB 12.9* 12.4*  HCT 41.0 39.4  MCV 99.5 99.2  PLT 292 292    Basic Metabolic Panel:   Recent Labs Lab 08/27/15 0356 08/28/15 0320 08/29/15 0158  NA 145 145 143  K 3.8 4.1 3.9  CL 113* 112* 112*  CO2 25 26 24   GLUCOSE 163* 181* 142*  BUN 34* 41* 44*  CREATININE 0.93 1.02 1.17  CALCIUM 9.2 9.1 9.0  MG 2.4 2.4  --   PHOS 4.6 4.4  --     Lipid Panel:     Component Value Date/Time   TRIG 331* 08/22/2015 0526   HgbA1c: No results found for: HGBA1C Urine Drug Screen:     Component Value Date/Time   LABOPIA NONE DETECTED 08/16/2015 0033   COCAINSCRNUR NONE DETECTED 08/16/2015 0033   LABBENZ NONE DETECTED 08/16/2015 0033   AMPHETMU NONE DETECTED 08/16/2015 0033   THCU NONE DETECTED 08/16/2015 0033   LABBARB NONE DETECTED 08/16/2015 0033     IMAGING  Ct Head Wo Contrast 08/20/2015  ADDENDUM: There is herniation of the cerebellar tonsils through the foramen magnum. This is stable when compared to previous studies and likely reflects Chiari malformation although herniation from mass-effect cannot be completely excluded. 08/20/2015  Stable right basal ganglia hemorrhage with mass-effect and slight midline shift.  08/16/2015   No significant change since the previous  study. Right basal ganglia hematoma volume 15.4 cc. Slightly better defined surrounding edema.  08/16/2015   Increased size of acute right basal ganglia hemorrhage, with increased mass effect. There is now 2 mm right to left midline shift. There is developing supratentorial sulcal effacement and persistent basilar cistern effacement concerning for developing cerebral edema.  08/15/2015  Acute right basal ganglia hemorrhage measuring 3.7 x 1.9 x 2.0 cm. Mild surrounding edema and mass effect, no midline shift. There is crowding of the basilar cisterns.   Ct Angio Head & Neck W/cm &/or Wo/cm 08/18/2015  1. Negative CTA of the head and neck. Please note that evaluation is somewhat limited by body habitus and timing of the contrast bolus. 2. Slight interval increase in size of acute right basal ganglia parenchymal hemorrhage, now measuring approximately 16 cc in estimated volume, previously 15.4 cc. Slightly increased and more defined surrounding vasogenic edema. Similar 2 mm right-to-left shift. Supratentorial sulcal effacement concerning for cerebral edema, similar to prior.   Dg Chest Port 1 View 08/26/2015 pending  08/24/2015 Slight interval improvement in left lower lobe atelectasis or pneumonia. Stable cardiomegaly and stable mild interstitial edema. The support tubes are in reasonable position.  08/23/2015 Stable support apparatus. Cardiomegaly. Central mild vascular congestion without pulmonary edema. Left basilar atelectasis. 08/21/2015   Cardiomegaly with vascular congestion. Improving interstitial edema.  08/20/2015  Mild interstitial edema/CHF.  08/17/2015   1. Lines and tubes in stable position . 2. Cardiomegaly with increased interstitial markings consistent with congestive heart failure. Small left pleural effusion cannot be excluded.  08/16/2015  1. Tip of the right central line in the mid SVC.  No pneumothorax. 2. Endotracheal and enteric tubes remain in place. 3. Unchanged enlargement of the cardiac  silhouette. Decreased vascular congestion and edema from prior.  08/16/2015   1. Endotracheal tube seen ending 2-3 cm above the carina. 2. Lungs mildly hypoexpanded. Vascular congestion noted. Increased interstitial markings raise concern for mild pulmonary edema, though pneumonia could have a similar appearance.   Dg Abd Portable 1v 08/16/2015  No acute findings per Enteric tube with tip in the right upper quadrant likely over the distal stomach or proximal duodenum.   2D Echocardiogram  Left ventricle: The cavity size was normal. There was severe concentric hypertrophy. Systolic function was normal. The estimated ejection fraction was in the range of 50% to 55%. Wall motion was normal; there were no regional wall motion abnormalities. Features are consistent with a pseudonormal left ventricular filling pattern, with concomitant abnormal relaxation and increased filling pressure (grade 2 diastolic dysfunction). Doppler parameters are consistent with high ventricular filling pressure.   PHYSICAL EXAM; Exam is stable today, no changes GENERAL- trach in place, on trach collar, off sedation, appears stated age, not in acute distress, Obese. HEENT- Atraumatic, normocephalic, Pupils reactive and equal CARDIAC- regular rate and rhythm, no murmurs, rubs or gallops PULM:  Decreased breath sounds at bases ABD:  Obese, soft NT EXTREM:  No C/C/E  NEUROLOGICAL EXAM : trach in place, on trach collar, off sedation, eye open, able to follow commands on the right side. Nods yes/no. Eyes open able to communicate using right hand  Pupils 2 mm reactive. Fundi not visualized. Eyes mid position, slightly dysconjugate gaze with mild exotropia left eye and difficulty with crossing the midline on the left. Eye closure intact but stronger on the right, left facial droop, tongue in mid position  LLE and LUE 0/5 and no spontaneous movement, RUE and RLE 4/5   Sensation:  Patient indicates no difference  Coordination  and gait not tested.   ASSESSMENT/PLAN Brad Mcgee is a 30 y.o. male with history of hypertension presenting with L sided weakness. CT showed a L basal ganglia hemorrhage, with change in mental status, pt had repeat Ct head which showed mildly increased size of hmg, and increased mass effect. Pt and was subsequently intubated with difficulty weaning, s/p trach.  Stroke:  Right BG ICH hemorrhage secondary to hypertension   Resultant  L hemiplegia, VDRF  Initial CT R BG hemorrhage 3.7x1.9x2, mild edema & mass effect  Repeat CT with neuro worsening increased size of R BG hmg w/ increased mass effect and cerebral edema.   Repeat CT same.  Stable hematoma. Unchanged shift or edema.  CTA head & neck unremarkable  2D Echo  EF 50-55%. No source of embolus   SCDs and lovenox for VTE prophylaxis  DIET - DYS 1 Room service appropriate?: Yes; Fluid consistency:: Nectar Thick, on tube feeds.  No antithrombotic prior to admission, not on antithrombotics due to ICH  Ongoing aggressive stroke risk factor management  Therapy recommendations:  CIR. Admissions coordinator following Disposition:  CLR  Acute respiratory failure MSSA bronchitis vs PNA  Intubated in the ED  CCM managing vent, Off propofol and fentanyl this am  MSSA in sputum. abx narrowed from zosyn to ancef  Did not tolerate wean  Trach placement by CCM 08/23/2015  If unable to wean, ? Consider LTACH  Critical Care team to trial trach collar and determine if able to tolerate overnight without mechanical ventilation  Dysphagia  DIET - DYS 1 Room service appropriate?: Yes; Fluid consistency:: Nectar Thick   On tube feedings  Will need to discuss PEG if patient unable to pass evaluation with speaking valve in place   Medically induced hypernatremia for Cerebral Edema control  3% off   NA slowing drifting down, 148 today  Rash - allergic  During last week, previous discussion with inpt pharmacy and ancef  was considered as most likely culprit, metoprolol less likely. Pt was on lisinopril at home  Thus Ancef discontinued since Abx treatment had been 6 days now   Hypertensive Emergency  BP 182/124 on arrival in setting of acute neurologic symptoms  BP goal < 160   Treated with cardene, now off  metoprolol, lisinopril, triamterene-HCTZ 37.2/25   BP stabilizing   Other Stroke Risk Factors  Morbid Obesity, Body mass index is 42.56 kg/(m^2).   Acute on chronic diastolic CHF, grade 2 chronic CHF  High risk OSA. OP eval recommended  Other Active Problems  Anemia, mild  Leukocytosis, worsened.  WBC was normalized; however, yesterday 17 and now 21 Restart vanc and zosyn for .  MSSA in sputum  Hypokalemia, improved today  Loose stools that are c.dif negative.  Improving with Imodium  Hospital day # 13    CRITICAL CARE NEUROLOGY ATTENDING NOTE Patient was seen and examined by me personally. I independently viewed imaging studies, participated in medical decision making and plan of care. The laboratory and radiographic studies were personally reviewed by me.  ROS completed by me personally and pertinent positives fully assessed.  Patient denies complaints by nodding and shaking head.  Assessment and plan completed by me personally.  Today:  Patient can transfer to floor bed today  continue Vanc and zosyn per pharmacy  Speech therapy to do swallow eval today  Transfer to rehab in few days  Condition is unchanged    Total time spent: 35 minutes   SIGNED BY: Dr. Delia HeadyPramod Benjamin Merrihew, MD

## 2015-08-29 NOTE — Evaluation (Signed)
Clinical/Bedside Swallow Evaluation Patient Details  Name: Brad Mcgee MRN: [id number] Date of Birth: 11/28/1985  Today's Date: 08/29/2015 Time: SLP Start Time (ACUTE ONLY): 0818 SLP Stop Time (ACUTE ONLY): 0850 SLP Time Calculation (min) (ACUTE ONLY): 32 min  Past Medical History:  Past Medical History  Diagnosis Date  . Hypertension    Past Surgical History: History reviewed. No pertinent past surgical history. HPI:  30 yo male with slurred speech and Lt sided weakness and headache. Intubated 5/30, trach 6/7. CT head which showed 3.7 x 1.9 x 2 cm Rt basal ganglia ICH which then increased to 4.5 x 1.9 x 3.4 cm ICH on f/u CT head.    Assessment / Plan / Recommendation Clinical Impression  Pt demonstrates oral dysphagia due to left lingual and labial neuromuscular weakness with decreased labial seal, anterior spillage and mild oral residuals. Pt was able to initiate a swallow with no immediate signs of aspiration. Late coughing did occur raising suspicion for silent aspriation, which is probable given prolonged intubation and probable sensory deficits resulting from CVA. SLP provided max verbal and tactile cueing for pt to support head during PO intake and take small sips with strong labial seal on the right, which he followed well. Pt is ready to attempt objective testing to determine if PO intake is possible at this time. Will f/u with MBS at 1 pm.     Aspiration Risk  Severe aspiration risk    Diet Recommendation NPO        Other  Recommendations Oral Care Recommendations: Oral care QID   Follow up Recommendations  Inpatient Rehab    Frequency and Duration            Prognosis        Swallow Study   General HPI: 30 yo male with slurred speech and Lt sided weakness and headache. Intubated 5/30, trach 6/7. CT head which showed 3.7 x 1.9 x 2 cm Rt basal ganglia ICH which then increased to 4.5 x 1.9 x 3.4 cm ICH on f/u CT head.  Type of Study: Bedside Swallow  Evaluation Diet Prior to this Study: NPO Respiratory Status: Trach Collar History of Recent Intubation: Yes Length of Intubations (days): 9 days Date extubated: 08/23/15 Behavior/Cognition: Lethargic/Drowsy;Cooperative Oral Care Completed by SLP: Yes Oral Cavity - Dentition: Adequate natural dentition Vision: Functional for self-feeding Self-Feeding Abilities: Needs assist Patient Positioning: Postural control interferes with function Baseline Vocal Quality: Aphonic;Breathy Volitional Cough: Weak Volitional Swallow: Able to elicit    Oral/Motor/Sensory Function Overall Oral Motor/Sensory Function: Moderate impairment Facial ROM: Reduced left;Suspected CN VII (facial) dysfunction Facial Symmetry: Abnormal symmetry left;Suspected CN VII (facial) dysfunction Facial Strength: Reduced left;Suspected CN VII (facial) dysfunction Lingual ROM: Reduced left;Suspected CN XII (hypoglossal) dysfunction Lingual Symmetry: Abnormal symmetry left;Suspected CN XII (hypoglossal) dysfunction Lingual Strength: Reduced   Ice Chips Ice chips: Impaired Presentation: Spoon Oral Phase Functional Implications: Oral residue   Thin Liquid Thin Liquid: Impaired Presentation: Cup;Straw;Self Fed Oral Phase Impairments: Reduced labial seal Oral Phase Functional Implications: Left anterior spillage Pharyngeal  Phase Impairments: Cough - Delayed    Nectar Thick Nectar Thick Liquid: Not tested   Honey Thick Honey Thick Liquid: Not tested   Puree Puree: Impaired Presentation: Spoon Oral Phase Impairments: Reduced lingual movement/coordination Oral Phase Functional Implications: Right lateral sulci pocketing;Oral residue   Solid   GO   Solid: Not tested       Brad DittyBonnie Guillermo Nehring, MA CCC-SLP [phone]  Brad Mcgee, Brad Mcgee 08/29/2015,9:10 AM

## 2015-08-29 NOTE — Progress Notes (Signed)
Pt is sleeping comfortably at this time no distress or complications noted.  

## 2015-08-29 NOTE — Progress Notes (Signed)
[facility] pending medical readiness. Noted plans to change to cuffless trach in a few days per PCCM since pt passed swallow eval today. I will follow up tomorrow. [phone]

## 2015-08-29 NOTE — Progress Notes (Signed)
Speech Language Pathology Treatment: Dysphagia  Patient Details Name: Brad Mcgee MRN: [id number] DOB: 02/03/1986 Today's Date: 08/29/2015 Time: [id number] SLP Time Calculation (min) (ACUTE ONLY): 18 min  Assessment / Plan / Recommendation Clinical Impression  Skilled treatment session focused on addressing PMSV use and swallow education with patient, father and RN.  SLP briefly reviewed how to check for cuff deflation, importance of lightly donning PMSV, and reviewed when patient is to wear valve.  Father and RN verbalized understanding.  One of the conditions for valve use is during PO intake which was the focus of this session.  Patient with MBS earlier today that recommended initiation of Dys.1 textures and nectar-thick liquids via teaspoon. SLP reviewed proper positioning of bed and patient's head prior to placement of teaspoon boluses on the right side of mouth. Patient exhibited minimal left sided anterior loss with extra time to initiate swallows with no overt s/s of aspiration.  Father and RN verbalized understanding with RN returning demonstration.  Continue with current plan of care.          HPI HPI: 30 yo male with slurred speech and Lt sided weakness and headache. Intubated 5/30, trach 6/7. CT head which showed 3.7 x 1.9 x 2 cm Rt basal ganglia ICH which then increased to 4.5 x 1.9 x 3.4 cm ICH on f/u CT head.       SLP Plan  Continue with current plan of care     Recommendations  Diet recommendations: Dysphagia 1 (puree);Nectar-thick liquid Liquids provided via: Teaspoon Medication Administration: Crushed with puree Supervision: Patient able to self feed;Full supervision/cueing for compensatory strategies;Trained caregiver to feed patient;Staff to assist with self feeding Compensations: Slow rate;Small sips/bites;Minimize environmental distractions;Follow solids with liquid;Multiple dry swallows after each bite/sip Postural Changes and/or Swallow Maneuvers: Seated upright 90  degrees             Oral Care Recommendations: Oral care QID Follow up Recommendations: Inpatient Rehab Plan: Continue with current plan of care     GO               Brad FerrettiMelissa Yuchen Mcgee, M.A., CCC-SLP [phone]   Brad Mcgee 08/29/2015, 4:28 PM

## 2015-08-29 NOTE — Progress Notes (Signed)
Pt is stable at this time family at bedside.

## 2015-08-29 NOTE — Progress Notes (Signed)
PT Cancellation Note  Patient Details Name: Brad MaxwellShawon M Mcgee MRN: [id number] DOB: 12/31/1985   Cancelled Treatment:    Reason Eval/Treat Not Completed: Medical issues which prohibited therapy. Noted order for doppler to Lt leg to rule out DVT. Will await results prior to further activity.   Nicholette Dolson 08/29/2015, 2:16 PM  Pager [phone]

## 2015-08-29 NOTE — Progress Notes (Signed)
All emergency equipment for trach placed at bedside.  Per Dr. Molli KnockYacoub #[id number] Shiley ordered and placed at bedside for trach change tomorrow by MD.

## 2015-08-29 NOTE — Progress Notes (Signed)
Report called to [facility] and patient is transferring to room floor without tele as ordered.

## 2015-08-29 NOTE — Progress Notes (Signed)
Speech Language Pathology Treatment: Brad Mcgee Speaking valve  Patient Details Name: Brad Mcgee MRN: [id number] DOB: 09/10/1985 Today's Date: 08/29/2015 Time: [id number] SLP Time Calculation (min) (ACUTE ONLY): 32 min  Assessment / Plan / Recommendation Clinical Impression  Pt seen for trial of PMSV and for bedside swallow eval (see next note). Pt lethargic this session, awakened from sleep and initially required max cues to sustain arousal and follow commands. Provided repositioning and max verbal cues for deep breathing for productive cough to clear secretions. Pt demonstrated very weak ineffective volitional coughing, but stronger involuntary cough to tracheally expectorate secretions. PMSV was tolerated for 25 minutes with no change in vital signs or CO2 trapping. Pt was able to redirect air to upper airway with max verbal cues to hum, clear throat and repeat single words, but breath support was very weak and minimal phonation occurred. Likely there is impaired phonation following prolonged intubation as well as decreased breath support and effort from pt. Pt would benefit from respiratory muscle strength training, but will need to clarify if pt is appropriate given hx of ICH. Pt to wear PMSV with full staff (RN/RT/therapy) supervision.    HPI HPI: 30 yo male with slurred speech and Lt sided weakness and headache. Intubated 5/30, trach 6/7. CT head which showed 3.7 x 1.9 x 2 cm Rt basal ganglia ICH which then increased to 4.5 x 1.9 x 3.4 cm ICH on f/u CT head.       SLP Plan  Continue with current plan of care;MBS     Recommendations         Patient may use Passy-Mcgee Speech Valve: Intermittently with supervision;During all therapies with supervision PMSV Supervision: Full MD: Please consider changing trach tube to : Cuffless      General recommendations: Rehab consult Oral Care Recommendations: Oral care QID Follow up Recommendations: Inpatient Rehab Plan: Continue with current  plan of care;MBS     GO               Brad DittyBonnie Nelline Lio, MA CCC-SLP [phone]  Brad Mcgee, Brad Mcgee Brad Mcgee 08/29/2015, 8:55 AM

## 2015-08-30 ENCOUNTER — Encounter (HOSPITAL_COMMUNITY): Payer: 59

## 2015-08-30 LAB — BASIC METABOLIC PANEL
Anion gap: 6 (ref 5–15)
BUN: 42 mg/dL — ABNORMAL HIGH (ref 6–20)
CO2: 24 mmol/L (ref 22–32)
Calcium: 8.7 mg/dL — ABNORMAL LOW (ref 8.9–10.3)
Chloride: 113 mmol/L — ABNORMAL HIGH (ref 101–111)
Creatinine, Ser: 1.12 mg/dL (ref 0.61–1.24)
GFR calc Af Amer: >60 mL/min (ref >60)
GFR calc non Af Amer: >60 mL/min (ref >60)
Glucose, Bld: 136 mg/dL — ABNORMAL HIGH (ref 65–99)
Potassium: 4.1 mmol/L (ref 3.5–5.1)
Sodium: 143 mmol/L (ref 135–145)

## 2015-08-30 LAB — CBC
HCT: 36.7 % — ABNORMAL LOW (ref 39.0–52.0)
Hemoglobin: 11.3 g/dL — ABNORMAL LOW (ref 13.0–17.0)
MCH: 31.1 pg (ref 26.0–34.0)
MCHC: 30.8 g/dL (ref 30.0–36.0)
MCV: 101.1 fL — ABNORMAL HIGH (ref 78.0–100.0)
Platelets: 264 10*3/uL (ref 150–400)
RBC: 3.63 MIL/uL — ABNORMAL LOW (ref 4.22–5.81)
RDW: 13.2 % (ref 11.5–15.5)
WBC: 14 10*3/uL — ABNORMAL HIGH (ref 4.0–10.5)

## 2015-08-30 LAB — URINE CULTURE
Culture: 20000 — AB
Special Requests: NORMAL

## 2015-08-30 LAB — GLUCOSE, CAPILLARY
Glucose-Capillary: 127 mg/dL — ABNORMAL HIGH (ref 65–99)
Glucose-Capillary: 127 mg/dL — ABNORMAL HIGH (ref 65–99)
Glucose-Capillary: 133 mg/dL — ABNORMAL HIGH (ref 65–99)
Glucose-Capillary: 128 mg/dL — ABNORMAL HIGH (ref 65–99)
Glucose-Capillary: 111 mg/dL — ABNORMAL HIGH (ref 65–99)

## 2015-08-30 MED ORDER — PIPERACILLIN-TAZOBACTAM 3.375 G IVPB
3.3750 g | Freq: Three times a day (TID) | INTRAVENOUS | Status: DC
Start: 2015-08-30 — End: 2015-09-01
  Administered 2015-08-30 – 2015-09-01 (×6): 3.375 g via INTRAVENOUS
  Filled 2015-08-30 (×8): qty 50

## 2015-08-30 MED ORDER — PRO-STAT SUGAR FREE PO LIQD
30.0000 mL | Freq: Two times a day (BID) | ORAL | Status: DC
  Administered 2015-08-30 – 2015-08-31 (×3): 30 mL via ORAL
  Filled 2015-08-30 (×5): qty 30

## 2015-08-30 MED ORDER — DEXTROSE 5 % IV SOLN: 2.0000 g | INTRAVENOUS | Status: DC

## 2015-08-30 NOTE — Progress Notes (Signed)
STROKE TEAM PROGRESS NOTE   SUBJECTIVE (INTERVAL HISTORY) His family member is at the bedside. Plans for trach change today to cuffless LE doppler from yesterday has not yet been done. He appears medically stable for discharge to inpatient rehabilitation when bed becomes available   OBJECTIVE Temp:  [98 F (36.7 C)-100.1 F (37.8 C)] 98.8 F (37.1 C) (06/14 0940) Pulse Rate:  [87-104] 104 (06/14 0940) Cardiac Rhythm:  [-] Normal sinus rhythm (06/14 0812) Resp:  [16-20] 18 (06/14 0940) BP: (124-133)/(68-86) 133/86 mmHg (06/14 0940) SpO2:  [92 %-100 %] 100 % (06/14 0940) FiO2 (%):  [28 %] 28 % (06/14 0940)  CBC:   Recent Labs Lab 08/29/15 0158 08/30/15 0556  WBC 18.2* 14.0*  HGB 12.4* 11.3*  HCT 39.4 36.7*  MCV 99.2 101.1*  PLT 292 264    Basic Metabolic Panel:   Recent Labs Lab 08/27/15 0356 08/28/15 0320 08/29/15 0158 08/30/15 0556  NA 145 145 143 143  K 3.8 4.1 3.9 4.1  CL 113* 112* 112* 113*  CO2 GLUCOSE 163* 181* 142* 136*  BUN 34* 41* 44* 42*  CREATININE 0.93 1.02 1.17 1.12  CALCIUM 9.2 9.1 9.0 8.7*  MG 2.4 2.4  --   --   PHOS 4.6 4.4  --   --     PHYSICAL EXAM; Exam is stable today, no changes GENERAL- trach in place, on trach collar, off sedation, appears stated age, not in acute distress, Obese. HEENT- Atraumatic, normocephalic, Pupils reactive and equal CARDIAC- regular rate and rhythm, no murmurs, rubs or gallops PULM:  Decreased breath sounds at bases ABD:  Obese, soft NT EXTREM:  No C/C/E  NEUROLOGICAL EXAM : trach in place, on trach collar, off sedation, eye open, able to follow commands on the right side. Nods yes/no. Eyes open able to communicate using right hand  Pupils 2 mm reactive. Fundi not visualized. Eyes mid position, slightly dysconjugate gaze with mild exotropia left eye and difficulty with crossing the midline on the left. Eye closure intact but stronger on the right, left facial droop, tongue in mid position  LLE  and LUE 0/5 and no spontaneous movement, RUE and RLE 4/5   Sensation:  Patient indicates no difference  Coordination and gait not tested.   ASSESSMENT/PLAN [name] is a 30 y.o. male with history of hypertension presenting with L sided weakness. CT showed a L basal ganglia hemorrhage, with change in mental status, pt had repeat Ct head which showed mildly increased size of hmg, and increased mass effect. Pt and was subsequently intubated with difficulty weaning, s/p trach.  Stroke:  Right BG ICH hemorrhage secondary to hypertension   Resultant  L hemiplegia, VDRF  Initial CT R BG hemorrhage 3.7x1.9x2, mild edema & mass effect  Repeat CT with neuro worsening increased size of R BG hmg w/ increased mass effect and cerebral edema.   Repeat CT same.  Stable hematoma. Unchanged shift or edema.  CTA head & neck unremarkable  2D Echo  EF 50-55%. No source of embolus   SCDs and lovenox for VTE prophylaxis  DIET - DYS 1 Room service appropriate?: Yes; Fluid consistency:: Nectar Thick, on tube feeds.  No antithrombotic prior to admission, not on antithrombotics due to ICH  Ongoing aggressive stroke risk factor management  Therapy recommendations:  CIR. Admissions coordinator following  Disposition:  CIR   Possible LLE DVT  L leg edema, improved from yesterday  LE doppler pending   Therapy  currently on hold  RN instructed to elevate leg  Acute respiratory failure MSSA bronchitis vs PNA  Intubated in the ED  CCM consulted  Treated with zosyn, narrowed to ancef ( which was discontinued as below). Completed Vanc. Back on zosyn as of yesterday by Dr. Molli KnockYacoub.   Trach placement by CCM 08/23/2015  For cufless Trach today  Dysphagia  secondary to stroke  DIET - DYS 1 Room service appropriate?: Yes; Fluid consistency:: Nectar Thick   Medically induced hypernatremia for Cerebral Edema control  Treated with 3%   NA normalized  Rash - allergic  During last  week, previous discussion with inpt pharmacy and ancef was considered as most likely culprit, metoprolol less likely. Pt was on lisinopril at home  Thus Ancef discontinued since Abx treatment had been 6 days now  Hypertensive Emergency  BP 182/124 on arrival in setting of acute neurologic symptoms  BP goal < 160   Treated with cardene, now off  metoprolol, lisinopril, triamterene-HCTZ 37.2/25   BP stabilizing  Other Stroke Risk Factors  Morbid Obesity, Body mass index is 42.56 kg/(m^2).   Acute on chronic diastolic CHF, grade 2 chronic CHF  High risk OSA. OP eval recommended  Other Active Problems  Anemia, mild  Hypokalemia, improved today  Loose stools that are c.dif negative.  Improving with Imodium  Hospital day # 14   [name],[name]  [facility] See Amion for Pager information 08/30/2015 10:03 AM  I have personally examined this patient, reviewed notes, independently viewed imaging studies, participated in medical decision making and plan of care. I have made any additions or clarifications directly to the above note. Agree with note above. Plan is to downsize tracheostomy tube today. Lower extremity venous Dopplers are pending. Patient will be transferred to inpatient rehabilitation when bed available. Patient is medically stable for transfer to rehabilitation. I had a long discussion with the patient and his father at the bedside and answered questions.  Delia HeadyPramod Sethi, MD Medical Director [facility] Pager: [phone] 08/30/2015 1:22 PM

## 2015-08-30 NOTE — Progress Notes (Signed)
Patient trach changed to #6 Shiley uncuffed by Dr. Molli KnockYacoub. Patient tolerated well. Placement verified by end-tidal and BBS.

## 2015-08-30 NOTE — Progress Notes (Signed)
PT/OT Cancellation Note  Patient Details Name: Brad MaxwellShawon M Kraker MRN: [id number] DOB: 03/14/1986   Cancelled Treatment:    Reason Eval/Treat Not Completed: Medical issues which prohibited therapy. Suspect L calf DVT due to red and swollen per RN. Awaiting doppler results. PT/OT to return as able once medically cleared.   Marcene BrawnChadwell, Kongmeng Santoro Marie 08/30/2015, 7:48 AM  Lewis ShockAshly Galena Logie, PT, DPT Pager #: [phone] Office #: [phone]

## 2015-08-30 NOTE — Procedures (Signed)
First Trach Change  Size 6 cuffed changed to 6 cuffless over tube changer after removal of sutures.  Good color change and air movement.  Alyson ReedyWesam G. Yacoub, M.D. [facility]. Pager: [phone]. After hours pager: [phone].

## 2015-08-30 NOTE — Progress Notes (Signed)
Nutrition Follow-up  DOCUMENTATION CODES:   Morbid obesity  INTERVENTION:  Magic cup TID with meals, each supplement provides 290 kcal and 9 grams of protein Pro-stat BID, each dose provide 100 kcal and 15 grams of protein   NUTRITION DIAGNOSIS:   Inadequate oral intake related to inability to eat as evidenced by NPO status.  Ongoing; diet advanced, PO improving  GOAL:   Patient will meet greater than or equal to 90% of their needs  Unmet  MONITOR:   TF tolerance, Labs, I & O's  REASON FOR ASSESSMENT:   Consult Enteral/tube feeding initiation and management  ASSESSMENT:   30 yo male with slurred speech and Lt sided weakness. This was associated with headache. He was intubated for airway protection. He had CT head which showed 3.7 x 1.9 x 2 cm Rt basal ganglia ICH which then increased to 4.5 x 1.9 x 3.4 cm ICH on f/u CT head. His BP was 187/106.  Pt pulled out NGT on 6/12 and diet was advanced to Dysphagia 1 nectar-thick liquids yesterday afternoon. Pt asleep at time of visit. Per pt's father at bedside, pt is eating fairly well, but dislikes some of the foods- father states pt required oral hygiene and build up in mouth may have caused altered taste. Per nursing notes, pt ate 50% of breakfast this morning. Pt's weight has decreased > 20 lbs since admission- 7% weight loss in 2 weeks is significant. Pt likely will not meet energy or protein needs on Dysphagia 1 diet. Father states that patient likes the Magic Cup ice cream.    Labs: low calcium, low hemoglobin  Diet Order:  DIET - DYS 1 Room service appropriate?: Yes; Fluid consistency:: Nectar Thick  Skin:  Reviewed, no issues  Last BM:  6/10  Height:   Ht Readings from Last 1 Encounters:  08/29/15 5\' 7"  (1.702 m)    Weight:   Wt Readings from Last 1 Encounters:  08/29/15 271 lb 13.2 oz (123.3 kg)    Ideal Body Weight:  67.2 kg  BMI:  Body mass index is 42.56 kg/(m^2).  Estimated Nutritional Needs:    Kcal:  2000-2200  Protein:  135-150 grams  Fluid:  > 2 L/day  EDUCATION NEEDS:   No education needs identified at this time  Dorothea Ogleeanne Asiana Benninger RD, LDN Inpatient Clinical Dietitian Pager: [phone] After Hours Pager: [phone]

## 2015-08-30 NOTE — Progress Notes (Signed)
Speech Language Pathology Treatment: Dysphagia;Passy Muir Speaking valve  Patient Details Name: Brad Mcgee MRN: [id number] DOB: 11/18/1985 Today's Date: 08/30/2015 Time: [id number] SLP Time Calculation (min) (ACUTE ONLY): 30 min  Assessment / Plan / Recommendation Clinical Impression  Pt seen with am meal. Provided verbal and tactile cues, max fading to mod level, to pt and father during PO intake to follow recommended strategies. Also trialed cup sips to right side of mouth with hand over hand assist to regulate bolus size due to impulsivity. Father may attempt this method into the future. Pt tolerated PO well, though initially pt had hard coughing with pudding; suspect pt was clearing standing oropharyngeal secretions, which were suctioned orally. As session progressed pt had only 1-2 instances of coughing, but did carry over volitional throat clearing throughout session. Primary concern is poor breath support with throat clearing and speech. Pt required max verbal cues from SLP and father to attempt speech instead of gestures. He whispered and mouthed words, but could not achieve sustained phonation or appropriate breath support. Hopeful that trach change will improve quantity of redirection of air to upper airway, but suspect this is more of a strength or cognitive issue. Pt will benefit from RMT if he is appropriate. Will discuss with neuro MD tomorrow. Of note, Pts girlfriend threw away his PMSV last night when it got secretions on it. Discussed cleaning with father and provided a new PMSV this session.    HPI HPI: 30 yo male with slurred speech and Lt sided weakness and headache. Intubated 5/30, trach 6/7. CT head which showed 3.7 x 1.9 x 2 cm Rt basal ganglia ICH which then increased to 4.5 x 1.9 x 3.4 cm ICH on f/u CT head.       SLP Plan  Continue with current plan of care     Recommendations  Diet recommendations: Dysphagia 1 (puree);Nectar-thick liquid Liquids provided via:  Teaspoon;Cup Medication Administration: Crushed with puree Supervision: Trained caregiver to feed patient;Full supervision/cueing for compensatory strategies;Staff to assist with self feeding Compensations: Slow rate;Small sips/bites;Minimize environmental distractions;Follow solids with liquid;Multiple dry swallows after each bite/sip Postural Changes and/or Swallow Maneuvers: Seated upright 90 degrees      Patient may use Passy-Muir Speech Valve: Intermittently with supervision;During all therapies with supervision;During PO intake/meals;Caregiver trained to provide supervision PMSV Supervision: Full MD: Please consider changing trach tube to : Cuffless      General recommendations: Rehab consult Oral Care Recommendations: Oral care BID Follow up Recommendations: Inpatient Rehab Plan: Continue with current plan of care     GO                [name], Riley NearingBonnie Caroline 08/30/2015, 9:48 AM

## 2015-08-30 NOTE — Progress Notes (Signed)
PULMONARY / CRITICAL CARE MEDICINE   Name: Brad Mcgee MRN: [id number] DOB: 06-Dec-1985    ADMISSION DATE:  08/15/2015 CONSULTATION DATE:  08/16/2015  REFERRING MD:  Dr. Amada Jupiter  CHIEF COMPLAINT:  Left side weakness  HISTORY OF PRESENT ILLNESS:  Hx from chart. 30 yo male with slurred speech and Lt sided weakness.  This was associated with headache.  He was intubated for airway protection.  He had CT head which showed 3.7 x 1.9 x 2 cm Rt basal ganglia ICH which then increased to 4.5 x 1.9 x 3.4 cm ICH on f/u CT head.  His BP was 187/106. Trach placed 6/7  SUBJECTIVE:  Awake and interactive, following commands on the right not left. Tolerating TC well.  No events overnight.  VITAL SIGNS: BP 133/86 mmHg  Pulse 94  Temp(Src) 98.8 F (37.1 C) (Axillary)  Resp 18  Ht 5\' 7"  (1.702 m)  Wt 123.3 kg (271 lb 13.2 oz)  BMI 42.56 kg/m2  SpO2 100%  HEMODYNAMICS:    VENTILATOR SETTINGS: Vent Mode:  [-]  FiO2 (%):  [28 %] 28 %  INTAKE / OUTPUT: I/O last 3 completed shifts: In: 1050 [I.V.:750; IV Piggyback:300] Out: 2045 [Urine:2045]  PHYSICAL EXAMINATION:  General:  Obese. No distress. Neuro:  Pupils symmetric. Moves R arm spont and to command.  Opens eyes with stimulation. Followscommands. L hemiplegia. Aware and alert HEENT: Trach in place. No scleral icterus or injection. Copious secretions, white/yellow Cardiovascular:  Regular rate & rhythm. No appreciable JVD given body habitus. Lungs:  Distant breath sounds. Symmetric chest rise on vent. Diminshed in bases Abdomen:  Soft. Protuberant. Normal bowel sounds. Integument:  Warm & dry. No rash on exposed skin.  LABS:  BMET  Recent Labs Lab 08/28/15 0320 08/29/15 0158 08/30/15 0556  NA 145 143 143  K 4.1 3.9 4.1  CL 112* 112* 113*  CO2 26 24 24   BUN 41* 44* 42*  CREATININE 1.02 1.17 1.12  GLUCOSE 181* 142* 136*   Electrolytes  Recent Labs Lab 08/24/15 0350 08/25/15 0256  08/27/15 0356 08/28/15 0320  08/29/15 0158 08/30/15 0556  CALCIUM 8.8* 8.9  < > 9.2 9.1 9.0 8.7*  MG 2.5* 2.4  --  2.4 2.4  --   --   PHOS 3.9  --   --  4.6 4.4  --   --   < > = values in this interval not displayed.  CBC  Recent Labs Lab 08/28/15 0320 08/29/15 0158 08/30/15 0556  WBC 21.0* 18.2* 14.0*  HGB 12.9* 12.4* 11.3*  HCT 41.0 39.4 36.7*  PLT 292 292 264    Coag's No results for input(s): APTT, INR in the last 168 hours.  Sepsis Markers  Recent Labs Lab 08/27/15 1150 08/28/15 0320 08/29/15 0158  PROCALCITON <0.10 <0.10 <0.10    ABG No results for input(s): PHART, PCO2ART, PO2ART in the last 168 hours.  Liver Enzymes No results for input(s): AST, ALT, ALKPHOS, BILITOT, ALBUMIN in the last 168 hours.  Cardiac Enzymes No results for input(s): TROPONINI, PROBNP in the last 168 hours.  Glucose  Recent Labs Lab 08/29/15 1128 08/29/15 1617 08/29/15 2157 08/30/15 0605 08/30/15 0829 08/30/15 1125  GLUCAP 129* 173* 115* 127* 127* 133*    Imaging Dg Swallowing Func-speech Pathology  08/29/2015  Objective Swallowing Evaluation: Type of Study: MBS-Modified Barium Swallow Study Patient Details Name: Brad Mcgee MRN: [id number] Date of Birth: 1985-09-29 Today's Date: 08/29/2015 Time: SLP Start Time (ACUTE ONLY): 1340-SLP Stop Time (ACUTE ONLY):  1410 SLP Time Calculation (min) (ACUTE ONLY): 30 min Past Medical History: Past Medical History Diagnosis Date . Hypertension  Past Surgical History: No past surgical history on file. HPI: 30 yo male with slurred speech and Lt sided weakness and headache. Intubated 5/30, trach 6/7. CT head which showed 3.7 x 1.9 x 2 cm Rt basal ganglia ICH which then increased to 4.5 x 1.9 x 3.4 cm ICH on f/u CT head.  No Data Recorded Assessment / Plan / Recommendation CHL IP CLINICAL IMPRESSIONS 08/29/2015 Therapy Diagnosis Moderate oral phase dysphagia;Moderate pharyngeal phase dysphagia Clinical Impression Pt demonstrates a primary oral dysphagia due to significant  left sided buccal and lingual weakness with decreased bolus cohesion and mild to moderate residuals. Pt benefits from neutral head position (avoid left leaning) with teaspoon amounts of nectar thick liquids placed in the right buccal cavity to facilitate bolus passage through the oral cavity. Pt will also need oral suction after meals to clear residue. Pharyngeal phase is characterized by an intermittent delay in swallowing with occasional flash to frank silent penetration of thin and straw sips of nectar. Pt also with some accumulation of oral residuals spilling to pharynx post swallow that can penetrate the airway. Expect pt to improve as sustained arousal and strength improve for potential upgrade. For now recommend a conservative diet of dys 1/nectar teaspoon with PMSV in place. Encourage intermittent throat clearing Impact on safety and function Moderate aspiration risk   CHL IP TREATMENT RECOMMENDATION 08/29/2015 Treatment Recommendations Therapy as outlined in treatment plan below   Prognosis 08/29/2015 Prognosis for Safe Diet Advancement Good Barriers to Reach Goals -- Barriers/Prognosis Comment -- CHL IP DIET RECOMMENDATION 08/29/2015 SLP Diet Recommendations Dysphagia 1 (Puree) solids;Nectar thick liquid Liquid Administration via Spoon Medication Administration Whole meds with puree Compensations Slow rate;Small sips/bites;Clear throat intermittently Postural Changes Remain semi-upright after after feeds/meals (Comment);Seated upright at 90 degrees   CHL IP OTHER RECOMMENDATIONS 08/29/2015 Recommended Consults -- Oral Care Recommendations Oral care BID Other Recommendations Order thickener from pharmacy;Have oral suction available;Place PMSV during PO intake   CHL IP FOLLOW UP RECOMMENDATIONS 08/29/2015 Follow up Recommendations Inpatient Rehab   CHL IP FREQUENCY AND DURATION 08/29/2015 Speech Therapy Frequency (ACUTE ONLY) min 2x/week Treatment Duration 2 weeks      CHL IP ORAL PHASE 08/29/2015 Oral Phase  Impaired Oral - Pudding Teaspoon -- Oral - Pudding Cup -- Oral - Honey Teaspoon -- Oral - Honey Cup Left anterior bolus loss;Weak lingual manipulation;Reduced posterior propulsion;Lingual/palatal residue;Left pocketing in lateral sulci;Delayed oral transit;Decreased bolus cohesion Oral - Nectar Teaspoon Left anterior bolus loss;Weak lingual manipulation;Reduced posterior propulsion;Lingual/palatal residue;Left pocketing in lateral sulci;Delayed oral transit;Decreased bolus cohesion Oral - Nectar Cup -- Oral - Nectar Straw Left anterior bolus loss;Weak lingual manipulation;Reduced posterior propulsion;Lingual/palatal residue;Left pocketing in lateral sulci;Delayed oral transit;Decreased bolus cohesion Oral - Thin Teaspoon -- Oral - Thin Cup -- Oral - Thin Straw -- Oral - Puree Left anterior bolus loss;Weak lingual manipulation;Reduced posterior propulsion;Lingual/palatal residue;Left pocketing in lateral sulci;Delayed oral transit;Decreased bolus cohesion Oral - Mech Soft Left anterior bolus loss;Weak lingual manipulation;Reduced posterior propulsion;Lingual/palatal residue;Left pocketing in lateral sulci;Delayed oral transit;Decreased bolus cohesion Oral - Regular -- Oral - Multi-Consistency -- Oral - Pill -- Oral Phase - Comment --  CHL IP PHARYNGEAL PHASE 08/29/2015 Pharyngeal Phase Impaired Pharyngeal- Pudding Teaspoon -- Pharyngeal -- Pharyngeal- Pudding Cup -- Pharyngeal -- Pharyngeal- Honey Teaspoon Delayed swallow initiation-vallecula;Pharyngeal residue - valleculae;Pharyngeal residue - pyriform Pharyngeal -- Pharyngeal- Honey Cup -- Pharyngeal -- Pharyngeal- Nectar Teaspoon Delayed swallow  initiation-vallecula;Pharyngeal residue - valleculae;Pharyngeal residue - pyriform Pharyngeal -- Pharyngeal- Nectar Cup NT Pharyngeal Material enters airway, remains ABOVE vocal cords and not ejected out Pharyngeal- Nectar Straw Delayed swallow initiation-vallecula;Pharyngeal residue - valleculae;Pharyngeal residue -  pyriform;Penetration/Apiration after swallow Pharyngeal Material enters airway, remains ABOVE vocal cords and not ejected out Pharyngeal- Thin Teaspoon -- Pharyngeal -- Pharyngeal- Thin Cup -- Pharyngeal -- Pharyngeal- Thin Straw Pharyngeal residue - valleculae;Pharyngeal residue - pyriform;Penetration/Aspiration before swallow;Trace aspiration;Delayed swallow initiation-pyriform sinuses Pharyngeal Material enters airway, CONTACTS cords and then ejected out;Material enters airway, remains ABOVE vocal cords and not ejected out Pharyngeal- Puree Delayed swallow initiation-vallecula;Pharyngeal residue - valleculae Pharyngeal -- Pharyngeal- Mechanical Soft -- Pharyngeal -- Pharyngeal- Regular -- Pharyngeal -- Pharyngeal- Multi-consistency -- Pharyngeal -- Pharyngeal- Pill -- Pharyngeal -- Pharyngeal Comment --  No flowsheet data found. No flowsheet data found. Harlon DittyBonnie DeBlois, MA CCC-SLP [phone] [name], [name] 08/29/2015, 2:48 PM               STUDIES:  5/30 CT Head: 3.7 x 1.9 x 2 cm Rt basal ganglia ICH 5/31 CT Head:  No significant change. 5.2x1.9x3.0cm right basal ganglia hematoma. Mild mass effect. 5/31 TTE: Severe concentric LVH with EF 50-55%. No regional wall motion abnormalities. Grade 2 diastolic dysfunction. LA & RA normal in size. RV normal in size and function. No aortic stenosis or regurgitation. Aortic root normal in size. Trivial mitral regurgitation without stenosis. No pulmonic regurgitation or stenosis. No tricuspid regurgitation. No pericardial effusion. 6/01 Port CXR:  Increased bilateral interstitial markings. Unable to appreciate pleural effusion. ETT & CVL in stable position.  MICROBIOLOGY: MRSA PCR 5/31:  Negative  Sputum 6/2 GPC>>MSSA Blood 6/2>> neg  6/11 Blood Cx >>>NTD 6/11 Urine cx >>>GNR 6/11 Tracheal Asp >>>NTD  ANTIBIOTICS: 6/3 vanc>>6/4 6/3 zoysn>> 6/4 6/4 cefazolin >> 6/7  6/12 zosyn >6/14 6/12 Vancomycin >6/13 Rocephin 6/14>>>6/18  SIGNIFICANT  EVENTS: 5/30 Admit 5/31 Start 3% NS>> 6/7   LINES/TUBES: OETT 7.5 5/31 >> 6/7 R IJ CVL 5/31 >> OGT 5/31>>>6/7 Trach 6/7 (JY)>>> FOLEY 5/31 >> PIV x3  I reviewed CXR myself, trach is in good position.  ASSESSMENT / PLAN:  PULMONARY A: VDRF s/p trach now on ATC with copious secretions Compromised Airway - Secondary to ICH.  Acute on chronic Diastolic CHF & 3% NaCl High risk for OSA  P:   TC 24/7 28% SLP eval for PMV and swallow appreciated. Would need CPAP if / when decannulated Change trach to cuffless 6 today to assist with progression  CARDIOVASCULAR A:  Hypertensive Emergency Acute on Chronic Diastolic CHF - Likely secondary to 3% HS. Elevated Troponin I - Likely demand ischemia. No wall motion abnormalities on TTE. Grade 2 Diastolic CHF   P:  Lisinopril 20mg  QD, metoprolol 25 bid, Maxide-37.5 / 25 QD Holding diuresis  RENAL  Recent Labs Lab 08/28/15 0320 08/29/15 0158 08/30/15 0556  NA 145 143 143   A:   Hypernatremia - Medically induced. Hyperchloremia - Medically induced.  P:   Trending electrolytes & renal function daily Following UOP with Foley Replace electrolytes as indicated  GASTROINTESTINAL A:   Nutrition.  P:   Pepcid VT qhs Diet per SLP recommendations.  HEMATOLOGIC Wbc 11.2->13.1->21.3 A:   Anemia - Mild. Leukocytosis - Mild.  P:  Trending cell counts w/CBC SCDs for prophylaxis On enoxaparin for DVT prophylaxis given stability of Head Ct  INFECTIOUS A:   FUO - possibly secondary to ICH.  MSSA bronchitis vs PNA GNR in urine. P:   Recultured  6/11  Urine with enterobacter, change abx to rocephin with stop date.  ENDOCRINE CBG (last 3)   Recent Labs  08/30/15 0605 08/30/15 0829 08/30/15 1125  GLUCAP 127* 127* 133*   A:   No acute issues.    P:   Monitor blood sugar on daily labs.  FAMILY UPDATES:  Patient updated bedside.  Discussed with RT.  Alyson Reedy, M.D. [facility] Pulmonary/Critical Care  Medicine. Pager: [phone]. After hours pager: [phone].

## 2015-08-30 NOTE — Progress Notes (Signed)
[facility] before determining rehab venue. Dopplers pending before therapy reassessments. I will follow up [phone]

## 2015-08-31 ENCOUNTER — Inpatient Hospital Stay (HOSPITAL_COMMUNITY): Payer: 59

## 2015-08-31 DIAGNOSIS — M79662 Pain in left lower leg: Secondary | ICD-10-CM

## 2015-08-31 DIAGNOSIS — M7989 Other specified soft tissue disorders: Secondary | ICD-10-CM

## 2015-08-31 LAB — GLUCOSE, CAPILLARY
Glucose-Capillary: 116 mg/dL — ABNORMAL HIGH (ref 65–99)
Glucose-Capillary: 104 mg/dL — ABNORMAL HIGH (ref 65–99)
Glucose-Capillary: 92 mg/dL (ref 65–99)
Glucose-Capillary: 81 mg/dL (ref 65–99)

## 2015-08-31 MED ORDER — LIDOCAINE HCL 1 % IJ SOLN
INTRAMUSCULAR | Status: AC
  Administered 2015-08-31: 5 mL
  Filled 2015-08-31: qty 20

## 2015-08-31 MED ORDER — FENTANYL CITRATE (PF) 100 MCG/2ML IJ SOLN
INTRAMUSCULAR | Status: DC | PRN
  Administered 2015-08-31: 25 ug via INTRAVENOUS

## 2015-08-31 MED ORDER — IOPAMIDOL (ISOVUE-300) INJECTION 61%
INTRAVENOUS | Status: AC
  Administered 2015-08-31: 60 mL
  Filled 2015-08-31: qty 100

## 2015-08-31 MED ORDER — IOPAMIDOL (ISOVUE-300) INJECTION 61%
INTRAVENOUS | Status: AC
  Administered 2015-08-31: 15 mL
  Filled 2015-08-31: qty 50

## 2015-08-31 MED ORDER — MIDAZOLAM HCL 2 MG/2ML IJ SOLN
INTRAMUSCULAR | Status: DC | PRN
  Administered 2015-08-31: 1 mg via INTRAVENOUS

## 2015-08-31 MED ORDER — FENTANYL CITRATE (PF) 100 MCG/2ML IJ SOLN
INTRAMUSCULAR | Status: AC
  Filled 2015-08-31: qty 2

## 2015-08-31 MED ORDER — MIDAZOLAM HCL 2 MG/2ML IJ SOLN
INTRAMUSCULAR | Status: AC
  Filled 2015-08-31: qty 2

## 2015-08-31 NOTE — Care Management Note (Signed)
Case Management Note  Patient Details  Name: Brad Mcgee MRN: [id number] Date of Birth: 11/29/1985  Subjective/Objective:                    Action/Plan: Pt with cuffless trach. Found to be positive for DVT today and plan is for IVC filter. CM following for discharge needs.   Expected Discharge Date:                  Expected Discharge Plan:  IP Rehab Facility  In-House Referral:     Discharge planning Services  CM Consult  Post Acute Care Choice:    Choice offered to:     DME Arranged:    DME Agency:     HH Arranged:    HH Agency:     Status of Service:  In process, will continue to follow  Medicare Important Message Given:    Date Medicare IM Given:    Medicare IM give by:    Date Additional Medicare IM Given:    Additional Medicare Important Message give by:     If discussed at Long Length of Stay Meetings, dates discussed:    Additional Comments:  Kermit BaloKelli F Iracema Lanagan, RN 08/31/2015, 10:59 AM

## 2015-08-31 NOTE — Progress Notes (Signed)
OT Cancellation Note  Patient Details Name: Brad Mcgee MRN: [id number] DOB: 03/20/1985   Cancelled Treatment:    Reason Eval/Treat Not Completed: Other (comment) Attempted to see this am. Continue to await results of dopplers. Vascular to see pt this am - per nursing. Will attempt later.  [facility]ARY  Davion Flannery, OTR/L  [phone] 08/31/2015 08/31/2015, 8:24 AM

## 2015-08-31 NOTE — Sedation Documentation (Signed)
Vital signs stable. No complaints from pt at this time.  

## 2015-08-31 NOTE — Sedation Documentation (Signed)
Patient is resting comfortably. 

## 2015-08-31 NOTE — Progress Notes (Signed)
Pt arrived back on unit. Pt back on room oxygen and continuous pulse ox.

## 2015-08-31 NOTE — Procedures (Signed)
IVC filter No comp/EBL 

## 2015-08-31 NOTE — Progress Notes (Signed)
Pt's IV removed from left hand after RN received report of possible leak from IV. Upon assessment, RN found that IV was completely removed from pt's skin and lose under dressing. RN stopped fluids from running and documented removal of IV. RN also noted swelling in pt's hand possibly due to constriction of ID bracelet. Will remove and reapply bracelet to allow more circulation.

## 2015-08-31 NOTE — Progress Notes (Signed)
PULMONARY / CRITICAL CARE MEDICINE   Name: Brad Mcgee MRN: [id number] DOB: 06/13/1985    ADMISSION DATE:  08/15/2015 CONSULTATION DATE:  08/16/2015  REFERRING MD:  Dr. Amada JupiterKirkpatrick  CHIEF COMPLAINT:  Left side weakness  HISTORY OF PRESENT ILLNESS:  Hx from chart. 30 yo male with slurred speech and Lt sided weakness.  This was associated with headache.  He was intubated for airway protection.  He had CT head which showed 3.7 x 1.9 x 2 cm Rt basal ganglia ICH which then increased to 4.5 x 1.9 x 3.4 cm ICH on f/u CT head.  His BP was 187/106. Trach placed 6/7  SUBJECTIVE:  Awake and interactive Tolerating TC , phonating with valve  VITAL SIGNS: BP 119/66 mmHg  Pulse 91  Temp(Src) 97.4 F (36.3 C) (Oral)  Resp 18  Ht 5\' 7"  (1.702 m)  Wt 270 lb 8.1 oz (122.7 kg)  BMI 42.36 kg/m2  SpO2 99%  HEMODYNAMICS:    VENTILATOR SETTINGS: Vent Mode:  [-]  FiO2 (%):  [21 %-28 %] 21 %  INTAKE / OUTPUT: I/O last 3 completed shifts: In: 240 [P.O.:240] Out: 2020 [Urine:2020]  PHYSICAL EXAMINATION:  General:  Obese. No distress. Neuro:  Pupils symmetric. Moves R arm spont and to command. Followscommands. L hemiplegia. Alert and interactive HEENT: Trach in place. No scleral icterus or injection. Cardiovascular:  Regular rate & rhythm. No appreciable JVD given body habitus. Lungs:  Distant breath sounds. Symmetric chest rise on vent. Diminshed in bases Abdomen:  Soft. Protuberant. Normal bowel sounds. Integument:  Warm & dry. No rash on exposed skin.  LABS:  BMET  Recent Labs Lab 08/28/15 0320 08/29/15 0158 08/30/15 0556  NA 145 143 143  K 4.1 3.9 4.1  CL 112* 112* 113*  CO2 26 24 24   BUN 41* 44* 42*  CREATININE 1.02 1.17 1.12  GLUCOSE 181* 142* 136*   Electrolytes  Recent Labs Lab 08/25/15 0256  08/27/15 0356 08/28/15 0320 08/29/15 0158 08/30/15 0556  CALCIUM 8.9  < > 9.2 9.1 9.0 8.7*  MG 2.4  --  2.4 2.4  --   --   PHOS  --   --  4.6 4.4  --   --   < > = values  in this interval not displayed.  CBC  Recent Labs Lab 08/28/15 0320 08/29/15 0158 08/30/15 0556  WBC 21.0* 18.2* 14.0*  HGB 12.9* 12.4* 11.3*  HCT 41.0 39.4 36.7*  PLT 292 292 264    Coag's No results for input(s): APTT, INR in the last 168 hours.  Sepsis Markers  Recent Labs Lab 08/27/15 1150 08/28/15 0320 08/29/15 0158  PROCALCITON <0.10 <0.10 <0.10    ABG No results for input(s): PHART, PCO2ART, PO2ART in the last 168 hours.  Liver Enzymes No results for input(s): AST, ALT, ALKPHOS, BILITOT, ALBUMIN in the last 168 hours.  Cardiac Enzymes No results for input(s): TROPONINI, PROBNP in the last 168 hours.  Glucose  Recent Labs Lab 08/30/15 0605 08/30/15 0829 08/30/15 1125 08/30/15 1630 08/30/15 2234 08/31/15 0629  GLUCAP 127* 127* 133* 128* 111* 116*    Imaging No results found.  STUDIES:  5/30 CT Head: 3.7 x 1.9 x 2 cm Rt basal ganglia ICH 5/31 CT Head:  No significant change. 5.2x1.9x3.0cm right basal ganglia hematoma. Mild mass effect. 5/31 TTE: Severe concentric LVH with EF 50-55%. No regional wall motion abnormalities. Grade 2 diastolic dysfunction. LA & RA normal in size. RV normal in size and function. No aortic  stenosis or regurgitation. Aortic root normal in size. Trivial mitral regurgitation without stenosis. No pulmonic regurgitation or stenosis. No tricuspid regurgitation. No pericardial effusion.   MICROBIOLOGY: MRSA PCR 5/31:  Negative  Sputum 6/2 GPC>>MSSA Blood 6/2>> neg  6/11 Blood Cx >>>NTD 6/12 Urine cx >>> 20k enterobacter 6/11 Tracheal Asp >>>NTD  ANTIBIOTICS: 6/3 vanc>>6/4 6/3 zoysn>> 6/4 6/4 cefazolin >> 6/7  6/12 zosyn >6/14 6/12 Vancomycin >6/13 Rocephin 6/14>>>6/18  SIGNIFICANT EVENTS: 5/30 Admit 5/31 Start 3% NS>> 6/7   LINES/TUBES: OETT 7.5 5/31 >> 6/7 R IJ CVL 5/31 >> OGT 5/31>>>6/7 Trach 6/7 (JY)>>> FOLEY 5/31 >> PIV x3   ASSESSMENT / PLAN:  PULMONARY A: VDRF s/p trach now on ATC with copious  secretions Compromised Airway - Secondary to ICH.  Acute on chronic Diastolic CHF & 3% NaCl High risk for OSA 6/14 Changed trach to cuffless 6   P:   Can change trach collar to humidity and discontinue oxygen Dysphagia 1 diet, continue speech valve Would need CPAP if / when decannulated   CARDIOVASCULAR A:  Hypertensive Emergency Acute on Chronic Diastolic CHF - Likely secondary to 3% HS. Elevated Troponin I - Likely demand ischemia. No wall motion abnormalities on TTE. Grade 2 Diastolic CHF   P:  Lisinopril  QD, metoprolol 25 bid, Maxide-37.5 / 25 QD Holding diuresis   HEMATOLOGIC  A:   Anemia - Mild. Leukocytosis - Mild LLE peroneal DVT.  P:  Trending cell counts w/CBC Dc SCD from LLE, will need IVC filter On enoxaparin for DVT prophylaxis given stability of Head Ct  INFECTIOUS A:   FUO - possibly secondary to ICH.  MSSA bronchitis vs PNA GNR in urine. P:   Recultured 6/11  Urine with enterobacter, change abx to rocephin with stop date.  P:   Monitor blood sugar on daily labs.  FAMILY UPDATES:  mother updated bedside.  Cyril Mourning MD. Tonny Bollman. [place] [facility] Pager [phone] If no response call [phone]   08/31/2015

## 2015-08-31 NOTE — Progress Notes (Signed)
PT Cancellation Note  Patient Details Name: Brad Mcgee MRN: [id number] DOB: 12/25/1985   Cancelled Treatment:    Reason Eval/Treat Not Completed: Patient at procedure or test/unavailable (Pt off unit for IVC filter placement will continue efforts pendiing return, most likely in am.  )   Aliany Fiorenza Artis DelayJ Phyllis Abelson 08/31/2015, 3:00 PM Joycelyn RuaAimee Kathleena Freeman, PTA pager [phone]

## 2015-08-31 NOTE — Progress Notes (Signed)
*  Preliminary Results* Left lower extremity venous duplex completed. Left lower extremity is positive for acute deep vein thrombosis involving the left peroneal veins. There is no evidence of left Baker's cyst.  Preliminary results discussed with Dr. Pearlean BrownieSethi.  08/31/2015 8:45 AM  Gertie FeyMichelle Kinberly Perris, RVT, RDCS, RDMS

## 2015-08-31 NOTE — Progress Notes (Signed)
Pharmacy Antibiotic Note  [name] M Carter is a 30 y.o. male admitted on 08/15/2015 slurred speech and L-sided weakness, was found to have ICH. Pt was started on broad spectrum antibiotics on 6/4, these were narrowed to Ancef - pt completed course of Ancef for fever of unknown origin on 6/8. Broad spectrum antibiotics were restarted on 6/12 for presumed pneumonia due to fever and increased white count.   WBC 14, SCr 1.12, eCrCl > 100 ml/min, PCT wnl, Tmax/24 98.7.  Plan: --After discussion with Dr. Molli KnockYacoub, Zosyn 3.375 g q8h will be continued through 6/16 to complete 5 days of therapy (stop date entered). --Monitor cultures, clinical improvement --Will plan to monitor patient off antibiotics once course complete.  Height: 5\' 7"  (170.2 cm) Weight: 270 lb 8.1 oz (122.7 kg) IBW/kg (Calculated) : 66.1  Temp (24hrs), Avg:98.1 F (36.7 C), Min:97.4 F (36.3 C), Max:98.7 F (37.1 C)   Recent Labs Lab 08/26/15 0300 08/27/15 0356 08/28/15 0320 08/29/15 0158 08/30/15 0556  WBC 14.1* 17.0* 21.0* 18.2* 14.0*  CREATININE 0.99 0.93 1.02 1.17 1.12    Estimated Creatinine Clearance: 119.9 mL/min (by C-G formula based on Cr of 1.12).    Allergies  Allergen Reactions  . Bee Venom Anaphylaxis  . Coconut Oil Anaphylaxis  . Ancef [Cefazolin] Rash    Antimicrobials this admission: 6/3 vanc > 6/4 6/3 zosyn > 6/4 6/4 Ancef > 6/8 6/12 vanc > 6/13 6/12 zosyn >> [6/16]  Dose adjustments this admission: NA  Microbiology results: 6/12 UCx: 20K enterobacter aerogenes 6/11 Sputum: multiple organisms, none predominant 6/11 BCx: NGx3D 6/9 Cdiff neg 6/2 BCx: NGF 6/2 UCx: NGF 6/2 Sputum: MSSA  5/13 MRSA PCR: neg  Thank you for allowing pharmacy to be a part of this patient's care.  Arcola JanskyMeagan Jaxan Michel, PharmD Clinical Pharmacy Resident Pager: [phone] 08/31/2015 2:22 PM

## 2015-08-31 NOTE — Progress Notes (Signed)
Speech Language Pathology Treatment:    Patient Details Name: Brad Mcgee MRN: [id number] DOB: 01/26/1986 Today's Date: 08/31/2015 Time: [id number] SLP Time Calculation (min) (ACUTE ONLY): 32 min  Assessment / Plan / Recommendation Clinical Impression  Pt demonstrates significant improvement today. Pt initially sleeping upon arrival, but became fully alert and participatory with min cueing. SLP and mother able to fade from max verbal cues for pt to use speech to communicate to independence by end of session. Pt also progressing from max cues for increased breath support to min intermittent verbal cues from mother and SLP. Able to communicate at phrase and sentence level with 1-2 repetitions needed. Pt independently overarticulating. Pt likely getting improved redirection or air with smaller trach, but suspect ongoing incomplete vocal fold adduction following prolonged intubation. Tolerating PMSV well and mother verbalized and demonstrated PMSV precautions with independence.  Pt progressing with oral function. Pt complaining about pureed meals so SLP trials moistened solids. Pt able to manipulate boluses from left buccal cavity with extra time, but required a liquid or puree wash to clear oral residuals. Pt complained crackers were too dry.  SLP trialed thin liquids via teaspoon with equal tolerance to nectar. Would like to see pt in a chair with better posture prior to considering upgrading diet.  Discussed pt with Dr. Pearlean BrownieSethi who agreed to a trial of RMT despite history of hemorrhagic CVA. Will evaluate pt tomorrow unless d/c to CIR is imminent.    HPI HPI: 30 yo male with slurred speech and Lt sided weakness and headache. Intubated 5/30, trach 6/7. CT head which showed 3.7 x 1.9 x 2 cm Rt basal ganglia ICH which then increased to 4.5 x 1.9 x 3.4 cm ICH on f/u CT head.       SLP Plan  Continue with current plan of care     Recommendations  Diet recommendations: Dysphagia 1 (puree);Nectar-thick  liquid Liquids provided via: Cup;Teaspoon Medication Administration: Crushed with puree Supervision: Trained caregiver to feed patient;Full supervision/cueing for compensatory strategies;Staff to assist with self feeding Compensations: Slow rate;Small sips/bites;Minimize environmental distractions;Follow solids with liquid;Multiple dry swallows after each bite/sip Postural Changes and/or Swallow Maneuvers: Seated upright 90 degrees      Patient may use Passy-Muir Speech Valve: Intermittently with supervision;During all therapies with supervision;During PO intake/meals;Caregiver trained to provide supervision PMSV Supervision: Full MD: Please consider changing trach tube to : Smaller size      General recommendations: Rehab consult Oral Care Recommendations: Oral care BID Follow up Recommendations: Inpatient Rehab Plan: Continue with current plan of care     GO               [facility] Surgery Center LLCBonnie Srinidhi Landers, MA CCC-SLP [phone]  [name], [name] Caroline 08/31/2015, 9:39 AM

## 2015-08-31 NOTE — Consult Note (Signed)
Chief Complaint: Patient was seen in consultation today for retrievable inferior vena cava filter placement Chief Complaint  Patient presents with  . Code Stroke   at the request of Dr Delia Heady  Referring Physician(s): Dr Delia Heady  Supervising Physician: Jolaine Click  Patient Status: Inpatient  History of Present Illness: Brad Mcgee is a 30 y.o. male   Slurred speech; headache Hemorrhagic stroke 08/15/2015  progressing hemorrhage Intubated/trach HTN emergency CHF New LLE DVT  08/31/15:  *Preliminary Results* Left lower extremity venous duplex completed. Left lower extremity is positive for acute deep vein thrombosis involving the left peroneal veins. There is no evidence of left Baker's cyst.  Preliminary results discussed with Dr. Pearlean Brownie. Cannot anticoagulate  Request now for retrievable inferior vena cava filter placement  Past Medical History  Diagnosis Date  . Hypertension     History reviewed. No pertinent past surgical history.  Allergies: Bee venom; Coconut oil; and Ancef  Medications: Prior to Admission medications   Medication Sig Start Date End Date Taking? Authorizing Provider  lisinopril (PRINIVIL,ZESTRIL) 10 MG tablet Take 10 mg by mouth daily.   Yes Historical Provider, MD  naproxen (NAPROSYN) 500 MG tablet Take 1 tablet (500 mg total) by mouth 2 (two) times daily with a meal. 08/01/15  Yes Lutricia Feil, PA-C     Family History  Problem Relation Age of Onset  . Hypertension Mother     Social History   Social History  . Marital Status: Single    Spouse Name: N/A  . Number of Children: N/A  . Years of Education: N/A   Social History Main Topics  . Smoking status: Never Smoker   . Smokeless tobacco: Never Used  . Alcohol Use: No  . Drug Use: No  . Sexual Activity: Not Asked   Other Topics Concern  . None   Social History Narrative     Review of Systems: A 12 point ROS discussed and pertinent positives are  indicated in the HPI above.  All other systems are negative.  Review of Systems  Constitutional:       Unresponsive +trach    Vital Signs: BP 156/69 mmHg  Pulse 94  Temp(Src) 98.3 F (36.8 C) (Oral)  Resp 20  Ht 5\' 7"  (1.702 m)  Wt 270 lb 8.1 oz (122.7 kg)  BMI 42.36 kg/m2  SpO2 100%  Physical Exam  Cardiovascular: Normal rate and regular rhythm.   Pulmonary/Chest: No respiratory distress.  Abdominal: Soft.  Skin: Skin is warm.  Psychiatric:  Consented with mother at bedside  Nursing note and vitals reviewed.   Mallampati Score:  MD Evaluation Airway: Other (comments) Airway comments: trach Heart: WNL Abdomen: WNL Chest/ Lungs: WNL ASA  Classification: 3 Mallampati/Airway Score: Three  Imaging: Ct Angio Head W/cm &/or Wo Cm  08/18/2015  CLINICAL DATA:  Emanation for intra cerebral hemorrhage. EXAM: CT ANGIOGRAPHY HEAD AND NECK TECHNIQUE: Multidetector CT imaging of the head and neck was performed using the standard protocol during bolus administration of intravenous contrast. Multiplanar CT image reconstructions and MIPs were obtained to evaluate the vascular anatomy. Carotid stenosis measurements (when applicable) are obtained utilizing NASCET criteria, using the distal internal carotid diameter as the denominator. CONTRAST:  100 cc of Isovue 370. COMPARISON:  Prior study from 08/16/2015. FINDINGS: CT HEAD Intraparenchymal hematoma centered at the right basal ganglia measures 54 x 20 x 30 mm (estimated volume 16 cc, slightly increased in size relative to previous). Slightly increased surrounding vasogenic edema which  is also slightly more well-defined. A effacement of the right lateral ventricle, similar. Similar 2 mm of right-to-left shift. Size of the left lateral ventricle is stable without evidence for ventricular trapping. No evidence for intraventricular extension. No new subarachnoid or other hemorrhage. Diffuse cerebral sulcal effacement is similar to prior,  worrisome for cerebral edema. Remainder of the brain demonstrates no acute abnormality in is relatively normal in appearance. Scalp soft tissues within normal limits. No acute abnormality about the orbits. Mild exophthalmos is stable. Scattered mucosal thickening within the paranasal sinuses with fluid levels within the sphenoid sinuses. Patient is intubated. No mastoid effusion. Calvarium unchanged. CTA NECK Aortic arch: Examination somewhat limited by timing the contrast bolus and body habitus. Visualized aortic arch is normal in caliber with normal branch pattern. No high-grade stenosis at the origin of the great vessels. Insula note made of a bovine arch with common origin of the left common and right brachiocephalic arteries. Visualized subclavian arteries patent bilaterally. Right carotid system: Right common carotid artery origin not well evaluated due to streak artifact from venous contrast and body habitus, but is grossly patent without stenosis. Right common carotid artery patent distally to the bifurcation. No significant atheromatous plaque about the right bifurcation. Right ICA patent from the bifurcation to the skullbase without stenosis, dissection, or occlusion. Right external carotid artery and its branches within normal limits. Left carotid system: Left common carotid artery patent from its origin to the bifurcation. No significant atheromatous plaque about the left bifurcation. Left ICA patent from the bifurcation to the skullbase without stenosis, dissection, or occlusion. Left external carotid artery and its branches within normal limits. Vertebral arteries:Both vertebral arteries arise from the subclavian arteries. Origin of the right vertebral artery not well visualized due to streak artifact from venous contrast and body habitus. Vertebral arteries patent within the neck without occlusion. No definite stenosis or evidence for dissection, although evaluation somewhat limited due to body  habitus. Skeleton: No acute osseous abnormality. No worrisome lytic or blastic osseous lesions. Other neck: Mild atelectatic changes noted within the visualized lungs. Visualized lungs are otherwise grossly clear. Visualize mediastinum demonstrates no acute abnormality. Endotracheal tube terminates just above the carina. Thyroid gland normal. No significant adenopathy. No acute soft tissue abnormality. Right-sided central line in place within the right IJ. CTA HEAD Anterior circulation: Petrous, cavernous, and supraclinoid segments patent without definite flow limiting stenosis. Evaluation of the cavernous right ICA is somewhat limited due to asymmetric venous filling of the adjacent right cavernous sinus. A1 segments patent. Anterior communicating artery normal. Anterior cerebral arteries well opacified to their distal aspects. M1 segments patent without stenosis or occlusion. MCA bifurcations normal. Distal MCA branches well opacified and symmetric. Mild mass on the right MCA branches due to the right basal ganglia hemorrhage. Posterior circulation: Vertebral arteries patent to the vertebrobasilar junction. Posterior inferior cerebral arteries not well evaluated. Basilar artery patent to its distal aspect. Superior cerebellar arteries patent bilaterally. Both of the posterior cerebral arteries arise knee basilar artery and are well opacified to their distal aspects. Venous sinuses: Grossly patent without evidence for venous sinus thrombosis. Anatomic variants: No anatomic variant.  No definite aneurysm. Delayed phase: Mild leptomeningeal enhancement about the right basal ganglia hemorrhage due to vascular congestion. No other pathologic enhancement. IMPRESSION: 1. Negative CTA of the head and neck. Please note that evaluation is somewhat limited by body habitus and timing of the contrast bolus. 2. Slight interval increase in size of acute right basal ganglia parenchymal hemorrhage, now measuring  approximately 16  cc in estimated volume, previously 15.4 cc. Slightly increased and more defined surrounding vasogenic edema. Similar 2 mm right-to-left shift. Supratentorial sulcal effacement concerning for cerebral edema, similar to prior. Electronically Signed   By: Rise Mu M.D.   On: 08/18/2015 06:59   Ct Head Wo Contrast  08/20/2015  ADDENDUM REPORT: 08/20/2015 07:36 ADDENDUM: There is herniation of the cerebellar tonsils through the foramen magnum. This is stable when compared to previous studies and likely reflects Chiari malformation although herniation from mass-effect cannot be completely excluded. Electronically Signed   By: Charlett Nose M.D.   On: 08/20/2015 07:36  08/20/2015  CLINICAL DATA:  Follow-up intra cerebral hemorrhage EXAM: CT HEAD WITHOUT CONTRAST TECHNIQUE: Contiguous axial images were obtained from the base of the skull through the vertex without intravenous contrast. COMPARISON:  None. FINDINGS: Again noted is the acute hemorrhage with in the right basal ganglia measuring 5.4 by 1.9 cm, not significantly changed. Mild surrounding vasogenic edema. There is mass-effect on the right lateral ventricle with slight midline shift of 2 mm. No hydrocephalus. No new hemorrhage. Mucosal thickening in the paranasal sinuses. Mastoid air cells are clear. IMPRESSION: Stable right basal ganglia hemorrhage with mass-effect and slight midline shift. Electronically Signed: By: Charlett Nose M.D. On: 08/20/2015 07:16   Ct Head Wo Contrast  08/16/2015  CLINICAL DATA:  Followup intracranial hemorrhage EXAM: CT HEAD WITHOUT CONTRAST TECHNIQUE: Contiguous axial images were obtained from the base of the skull through the vertex without intravenous contrast. COMPARISON:  Earlier same day FINDINGS: Intraparenchymal hematoma in the right basal ganglia measures 5.2 x 1.9 x 3.0 cm (volume = 15.4 cc). This is not significantly changed since the previous exam 1 volume was calculated at 15.1 cc. Surrounding edema is  slightly better defined. Mild mass effect with some compression of the right lateral ventricle and right-to-left shift unchanged at 2 mm. No evidence of intraventricular or subarachnoid penetration. The remainder the brain appears normal. Calvarium is normal. No significant sinus disease. IMPRESSION: No significant change since the previous study. Right basal ganglia hematoma volume 15.4 cc. Slightly better defined surrounding edema. Electronically Signed   By: Paulina Fusi M.D.   On: 08/16/2015 12:20   Ct Head Wo Contrast  08/16/2015  CLINICAL DATA:  Follow-up intracranial hemorrhage. EXAM: CT HEAD WITHOUT CONTRAST TECHNIQUE: Contiguous axial images were obtained from the base of the skull through the vertex without intravenous contrast. COMPARISON:  Yesterday at 2238 hour FINDINGS: Right basal gangliar hemorrhage measures 4.5 x 1.9 x 3.4 cm (volume = 15.1 cc), previously 3.7 x 1.9 x 2.0 cm (volume =7.3 cc). There is persistent mass-effect on the frontal horn of the right lateral ventricle. There is now all 2 mm right to left midline shift. There is progressive supratentorial sulcal effacement. Persistent basilar cistern effacement. No intraventricular extension. No new areas of hemorrhage are seen. IMPRESSION: Increased size of acute right basal ganglia hemorrhage, with increased mass effect. There is now 2 mm right to left midline shift. There is developing supratentorial sulcal effacement and persistent basilar cistern effacement concerning for developing cerebral edema. These results were called by telephone at the time of interpretation on 08/16/2015 at 2:10 am to Dr. Marily Memos , who verbally acknowledged these results. Electronically Signed   By: Rubye Oaks M.D.   On: 08/16/2015 02:10   Ct Angio Neck W/cm &/or Wo/cm  08/18/2015  CLINICAL DATA:  Emanation for intra cerebral hemorrhage. EXAM: CT ANGIOGRAPHY HEAD AND NECK TECHNIQUE: Multidetector CT imaging  of the head and neck was performed using  the standard protocol during bolus administration of intravenous contrast. Multiplanar CT image reconstructions and MIPs were obtained to evaluate the vascular anatomy. Carotid stenosis measurements (when applicable) are obtained utilizing NASCET criteria, using the distal internal carotid diameter as the denominator. CONTRAST:  100 cc of Isovue 370. COMPARISON:  Prior study from 08/16/2015. FINDINGS: CT HEAD Intraparenchymal hematoma centered at the right basal ganglia measures 54 x 20 x 30 mm (estimated volume 16 cc, slightly increased in size relative to previous). Slightly increased surrounding vasogenic edema which is also slightly more well-defined. A effacement of the right lateral ventricle, similar. Similar 2 mm of right-to-left shift. Size of the left lateral ventricle is stable without evidence for ventricular trapping. No evidence for intraventricular extension. No new subarachnoid or other hemorrhage. Diffuse cerebral sulcal effacement is similar to prior, worrisome for cerebral edema. Remainder of the brain demonstrates no acute abnormality in is relatively normal in appearance. Scalp soft tissues within normal limits. No acute abnormality about the orbits. Mild exophthalmos is stable. Scattered mucosal thickening within the paranasal sinuses with fluid levels within the sphenoid sinuses. Patient is intubated. No mastoid effusion. Calvarium unchanged. CTA NECK Aortic arch: Examination somewhat limited by timing the contrast bolus and body habitus. Visualized aortic arch is normal in caliber with normal branch pattern. No high-grade stenosis at the origin of the great vessels. Insula note made of a bovine arch with common origin of the left common and right brachiocephalic arteries. Visualized subclavian arteries patent bilaterally. Right carotid system: Right common carotid artery origin not well evaluated due to streak artifact from venous contrast and body habitus, but is grossly patent without  stenosis. Right common carotid artery patent distally to the bifurcation. No significant atheromatous plaque about the right bifurcation. Right ICA patent from the bifurcation to the skullbase without stenosis, dissection, or occlusion. Right external carotid artery and its branches within normal limits. Left carotid system: Left common carotid artery patent from its origin to the bifurcation. No significant atheromatous plaque about the left bifurcation. Left ICA patent from the bifurcation to the skullbase without stenosis, dissection, or occlusion. Left external carotid artery and its branches within normal limits. Vertebral arteries:Both vertebral arteries arise from the subclavian arteries. Origin of the right vertebral artery not well visualized due to streak artifact from venous contrast and body habitus. Vertebral arteries patent within the neck without occlusion. No definite stenosis or evidence for dissection, although evaluation somewhat limited due to body habitus. Skeleton: No acute osseous abnormality. No worrisome lytic or blastic osseous lesions. Other neck: Mild atelectatic changes noted within the visualized lungs. Visualized lungs are otherwise grossly clear. Visualize mediastinum demonstrates no acute abnormality. Endotracheal tube terminates just above the carina. Thyroid gland normal. No significant adenopathy. No acute soft tissue abnormality. Right-sided central line in place within the right IJ. CTA HEAD Anterior circulation: Petrous, cavernous, and supraclinoid segments patent without definite flow limiting stenosis. Evaluation of the cavernous right ICA is somewhat limited due to asymmetric venous filling of the adjacent right cavernous sinus. A1 segments patent. Anterior communicating artery normal. Anterior cerebral arteries well opacified to their distal aspects. M1 segments patent without stenosis or occlusion. MCA bifurcations normal. Distal MCA branches well opacified and symmetric.  Mild mass on the right MCA branches due to the right basal ganglia hemorrhage. Posterior circulation: Vertebral arteries patent to the vertebrobasilar junction. Posterior inferior cerebral arteries not well evaluated. Basilar artery patent to its distal aspect. Superior cerebellar arteries  patent bilaterally. Both of the posterior cerebral arteries arise knee basilar artery and are well opacified to their distal aspects. Venous sinuses: Grossly patent without evidence for venous sinus thrombosis. Anatomic variants: No anatomic variant.  No definite aneurysm. Delayed phase: Mild leptomeningeal enhancement about the right basal ganglia hemorrhage due to vascular congestion. No other pathologic enhancement. IMPRESSION: 1. Negative CTA of the head and neck. Please note that evaluation is somewhat limited by body habitus and timing of the contrast bolus. 2. Slight interval increase in size of acute right basal ganglia parenchymal hemorrhage, now measuring approximately 16 cc in estimated volume, previously 15.4 cc. Slightly increased and more defined surrounding vasogenic edema. Similar 2 mm right-to-left shift. Supratentorial sulcal effacement concerning for cerebral edema, similar to prior. Electronically Signed   By: Rise Mu M.D.   On: 08/18/2015 06:59   Dg Chest Port 1 View  08/26/2015  CLINICAL DATA:  30 year old male with a history of respiratory failure EXAM: PORTABLE CHEST 1 VIEW COMPARISON:  08/24/2015, 16109 FINDINGS: Cardiomediastinal silhouette unchanged with cardiomegaly. Unchanged position of tracheostomy tube. Unchanged position of enteric feeding tube, which terminates out of the field of view. Interval removal of IJ central catheter. Low lung volumes.  Retrocardiac opacity. No visualized pneumothorax. IMPRESSION: Low lung volumes with potential atelectasis/ consolidation at the left base. Interval removal of right IJ catheter. Unchanged tracheostomy tube and enteric feeding tube. Signed,  Yvone Neu. Loreta Ave, DO [facility] Electronically Signed   By: Gilmer Mor D.O.   On: 08/26/2015 08:23   Dg Chest Port 1 View  08/24/2015  CLINICAL DATA:  Acute respiratory failure, intracranial hemorrhage EXAM: PORTABLE CHEST 1 VIEW COMPARISON:  Portable chest x-ray of August 23, 2015 FINDINGS: The cardiopericardial silhouette remains enlarged. The retrocardiac density has decreased somewhat. The interstitial markings remain prominent in the right lung. The tracheostomy appliance tip lies at the level of the clavicular heads. The feeding tube tip projects below the inferior margin of the image. The right internal jugular venous catheter tip projects over the midportion of the SVC. IMPRESSION: Slight interval improvement in left lower lobe atelectasis or pneumonia. Stable cardiomegaly and stable mild interstitial edema. The support tubes are in reasonable position. Electronically Signed   By: David  [place] M.D.   On: 08/24/2015 08:09   Dg Chest Port 1 View  08/23/2015  CLINICAL DATA:  30 year old male status post tracheostomy placement. EXAM: PORTABLE CHEST 1 VIEW COMPARISON:  Prior chest x-ray obtained earlier today at 6:09 a.m. how FINDINGS: Interval placement of a tracheostomy tube. The tube tip is midline and at the level of the clavicles. There is no evidence of pneumothorax. The gastric tube has been removed and replaced with an enteric feeding tube. The tip of the enteric feeding tube is not visualized but lies below the diaphragm likely within the stomach or small bowel. Right IJ approach central venous catheter remains in unchanged position with the tip overlying the superior cavoatrial junction. Stable cardiomegaly, pulmonary vascular congestion and bibasilar atelectasis. IMPRESSION: 1. Interval placement of a tracheostomy tube without evidence of immediate complication. 2. Stable cardiomegaly, pulmonary vascular congestion, low inspiratory volumes  and bibasilar atelectasis. 3. Interval removal of gastric tube and placement of an enteric feeding tube. Electronically Signed   By: Malachy Moan M.D.   On: 08/23/2015 16:54   Dg Chest Port 1 View  08/23/2015  CLINICAL DATA:  Acute respiratory failure acute EXAM: PORTABLE CHEST 1 VIEW COMPARISON:  08/21/2015 FINDINGS: Cardiomegaly again noted.  Central mild vascular congestion without convincing pulmonary edema. Persistent left basilar atelectasis. Stable endotracheal and NG tube position. Stable right IJ central line position. IMPRESSION: Stable support apparatus. Cardiomegaly. Central mild vascular congestion without pulmonary edema. Left basilar atelectasis. Electronically Signed   By: Natasha Mead M.D.   On: 08/23/2015 07:59   Dg Chest Port 1 View  08/21/2015  CLINICAL DATA:  Respiratory failure EXAM: PORTABLE CHEST 1 VIEW COMPARISON:  08/20/2015 FINDINGS: Support devices are stable. Cardiomegaly with vascular congestion. Improving interstitial edema pattern. No confluent opacities or effusions. IMPRESSION: Cardiomegaly with vascular congestion. Improving interstitial edema. Electronically Signed   By: Charlett Nose M.D.   On: 08/21/2015 07:27   Dg Chest Port 1 View  08/20/2015  CLINICAL DATA:  Respiratory failure EXAM: PORTABLE CHEST 1 VIEW COMPARISON:  08/17/2015 FINDINGS: Support devices are stable. There is cardiomegaly with vascular congestion and probable interstitial edema. No real change since prior study. No visible effusions or acute bony abnormality. IMPRESSION: Mild interstitial edema/CHF. Electronically Signed   By: Charlett Nose M.D.   On: 08/20/2015 07:31   Dg Chest Port 1 View  08/17/2015  CLINICAL DATA:  Respiratory failure . EXAM: PORTABLE CHEST 1 VIEW COMPARISON:  08/16/2015 . FINDINGS: Endotracheal tube, NG tube, right IJ line in stable position. Stable cardiomegaly. Interval increase in pulmonary interstitial markings consistent with congestive heart failure with interstitial edema.  Small left pleural effusion cannot be excluded. No pneumothorax . IMPRESSION: 1. Lines and tubes in stable position . 2. Cardiomegaly with increased interstitial markings consistent with congestive heart failure. Small left pleural effusion cannot be excluded. Electronically Signed   By: Maisie Fus  Register   On: 08/17/2015 07:46   Dg Chest Port 1 View  08/16/2015  CLINICAL DATA:  Central line placement. EXAM: PORTABLE CHEST 1 VIEW COMPARISON:  Earlier this day at 0139 hour FINDINGS: Endotracheal tube is 3.1 cm from the carina. Enteric tube in place. Tip of the right central line in the mid SVC. No pneumothorax. Low lung volumes. Enlargement of the cardiac silhouette is unchanged. Diminished vascular congestion and pulmonary edema from prior exam. Persistent perihilar opacities. IMPRESSION: 1. Tip of the right central line in the mid SVC.  No pneumothorax. 2. Endotracheal and enteric tubes remain in place. 3. Unchanged enlargement of the cardiac silhouette. Decreased vascular congestion and edema from prior. Electronically Signed   By: Rubye Oaks M.D.   On: 08/16/2015 06:54   Dg Chest Portable 1 View  08/16/2015  CLINICAL DATA:  Endotracheal tube placement.  Initial encounter. EXAM: PORTABLE CHEST 1 VIEW COMPARISON:  None. FINDINGS: The patient's endotracheal tube is seen ending 2-3 cm above the carina. The lungs are mildly hypoexpanded. Vascular crowding and vascular congestion are seen. Increased interstitial markings raise concern for mild pulmonary edema, though pneumonia could have a similar appearance. There is no evidence of pleural effusion or pneumothorax. The cardiomediastinal silhouette is borderline normal in size. No acute osseous abnormalities are seen. IMPRESSION: 1. Endotracheal tube seen ending 2-3 cm above the carina. 2. Lungs mildly hypoexpanded. Vascular congestion noted. Increased interstitial markings raise concern for mild pulmonary edema, though pneumonia could have a similar  appearance. Electronically Signed   By: Roanna Raider M.D.   On: 08/16/2015 02:07   Dg Abd Portable 1v  08/23/2015  CLINICAL DATA:  Feeding tube placement EXAM: PORTABLE ABDOMEN - 1 VIEW COMPARISON:  08/16/2015 FINDINGS: There is normal small bowel gas pattern. There is NG feeding tube probable within proximal duodenum. Please note the tip  of the tube is not included on the film. IMPRESSION: NG tube probable within proximal duodenum. Please note the tip of the feeding tube is not included on the film. Electronically Signed   By: Natasha Mead M.D.   On: 08/23/2015 17:01   Dg Abd Portable 1v  08/16/2015  CLINICAL DATA:  Orogastric tube placement. EXAM: PORTABLE ABDOMEN - 1 VIEW COMPARISON:  None. FINDINGS: There is an enteric tube with 1 small chole over the stomach and tip right of midline likely over the distal stomach or proximal duodenum. Bowel gas pattern is nonobstructive. Remainder of the exam is unremarkable. IMPRESSION: No acute findings per Enteric tube with tip in the right upper quadrant likely over the distal stomach or proximal duodenum. Electronically Signed   By: Elberta Fortis M.D.   On: 08/16/2015 07:20   Dg Swallowing Func-speech Pathology  08/29/2015  Objective Swallowing Evaluation: Type of Study: MBS-Modified Barium Swallow Study Patient Details Name: Brad Mcgee MRN: [id number] Date of Birth: April 24, 1985 Today's Date: 08/29/2015 Time: SLP Start Time (ACUTE ONLY): 1340-SLP Stop Time (ACUTE ONLY): 1410 SLP Time Calculation (min) (ACUTE ONLY): 30 min Past Medical History: Past Medical History Diagnosis Date . Hypertension  Past Surgical History: No past surgical history on file. HPI: 30 yo male with slurred speech and Lt sided weakness and headache. Intubated 5/30, trach 6/7. CT head which showed 3.7 x 1.9 x 2 cm Rt basal ganglia ICH which then increased to 4.5 x 1.9 x 3.4 cm ICH on f/u CT head.  No Data Recorded Assessment / Plan / Recommendation CHL IP CLINICAL IMPRESSIONS 08/29/2015 Therapy  Diagnosis Moderate oral phase dysphagia;Moderate pharyngeal phase dysphagia Clinical Impression Pt demonstrates a primary oral dysphagia due to significant left sided buccal and lingual weakness with decreased bolus cohesion and mild to moderate residuals. Pt benefits from neutral head position (avoid left leaning) with teaspoon amounts of nectar thick liquids placed in the right buccal cavity to facilitate bolus passage through the oral cavity. Pt will also need oral suction after meals to clear residue. Pharyngeal phase is characterized by an intermittent delay in swallowing with occasional flash to frank silent penetration of thin and straw sips of nectar. Pt also with some accumulation of oral residuals spilling to pharynx post swallow that can penetrate the airway. Expect pt to improve as sustained arousal and strength improve for potential upgrade. For now recommend a conservative diet of dys 1/nectar teaspoon with PMSV in place. Encourage intermittent throat clearing Impact on safety and function Moderate aspiration risk   CHL IP TREATMENT RECOMMENDATION 08/29/2015 Treatment Recommendations Therapy as outlined in treatment plan below   Prognosis 08/29/2015 Prognosis for Safe Diet Advancement Good Barriers to Reach Goals -- Barriers/Prognosis Comment -- CHL IP DIET RECOMMENDATION 08/29/2015 SLP Diet Recommendations Dysphagia 1 (Puree) solids;Nectar thick liquid Liquid Administration via Spoon Medication Administration Whole meds with puree Compensations Slow rate;Small sips/bites;Clear throat intermittently Postural Changes Remain semi-upright after after feeds/meals (Comment);Seated upright at 90 degrees   CHL IP OTHER RECOMMENDATIONS 08/29/2015 Recommended Consults -- Oral Care Recommendations Oral care BID Other Recommendations Order thickener from pharmacy;Have oral suction available;Place PMSV during PO intake   CHL IP FOLLOW UP RECOMMENDATIONS 08/29/2015 Follow up Recommendations Inpatient Rehab   CHL IP  FREQUENCY AND DURATION 08/29/2015 Speech Therapy Frequency (ACUTE ONLY) min 2x/week Treatment Duration 2 weeks      CHL IP ORAL PHASE 08/29/2015 Oral Phase Impaired Oral - Pudding Teaspoon -- Oral - Pudding Cup -- Oral - Honey Teaspoon -- Oral -  Honey Cup Left anterior bolus loss;Weak lingual manipulation;Reduced posterior propulsion;Lingual/palatal residue;Left pocketing in lateral sulci;Delayed oral transit;Decreased bolus cohesion Oral - Nectar Teaspoon Left anterior bolus loss;Weak lingual manipulation;Reduced posterior propulsion;Lingual/palatal residue;Left pocketing in lateral sulci;Delayed oral transit;Decreased bolus cohesion Oral - Nectar Cup -- Oral - Nectar Straw Left anterior bolus loss;Weak lingual manipulation;Reduced posterior propulsion;Lingual/palatal residue;Left pocketing in lateral sulci;Delayed oral transit;Decreased bolus cohesion Oral - Thin Teaspoon -- Oral - Thin Cup -- Oral - Thin Straw -- Oral - Puree Left anterior bolus loss;Weak lingual manipulation;Reduced posterior propulsion;Lingual/palatal residue;Left pocketing in lateral sulci;Delayed oral transit;Decreased bolus cohesion Oral - Mech Soft Left anterior bolus loss;Weak lingual manipulation;Reduced posterior propulsion;Lingual/palatal residue;Left pocketing in lateral sulci;Delayed oral transit;Decreased bolus cohesion Oral - Regular -- Oral - Multi-Consistency -- Oral - Pill -- Oral Phase - Comment --  CHL IP PHARYNGEAL PHASE 08/29/2015 Pharyngeal Phase Impaired Pharyngeal- Pudding Teaspoon -- Pharyngeal -- Pharyngeal- Pudding Cup -- Pharyngeal -- Pharyngeal- Honey Teaspoon Delayed swallow initiation-vallecula;Pharyngeal residue - valleculae;Pharyngeal residue - pyriform Pharyngeal -- Pharyngeal- Honey Cup -- Pharyngeal -- Pharyngeal- Nectar Teaspoon Delayed swallow initiation-vallecula;Pharyngeal residue - valleculae;Pharyngeal residue - pyriform Pharyngeal -- Pharyngeal- Nectar Cup NT Pharyngeal Material enters airway, remains ABOVE  vocal cords and not ejected out Pharyngeal- Nectar Straw Delayed swallow initiation-vallecula;Pharyngeal residue - valleculae;Pharyngeal residue - pyriform;Penetration/Apiration after swallow Pharyngeal Material enters airway, remains ABOVE vocal cords and not ejected out Pharyngeal- Thin Teaspoon -- Pharyngeal -- Pharyngeal- Thin Cup -- Pharyngeal -- Pharyngeal- Thin Straw Pharyngeal residue - valleculae;Pharyngeal residue - pyriform;Penetration/Aspiration before swallow;Trace aspiration;Delayed swallow initiation-pyriform sinuses Pharyngeal Material enters airway, CONTACTS cords and then ejected out;Material enters airway, remains ABOVE vocal cords and not ejected out Pharyngeal- Puree Delayed swallow initiation-vallecula;Pharyngeal residue - valleculae Pharyngeal -- Pharyngeal- Mechanical Soft -- Pharyngeal -- Pharyngeal- Regular -- Pharyngeal -- Pharyngeal- Multi-consistency -- Pharyngeal -- Pharyngeal- Pill -- Pharyngeal -- Pharyngeal Comment --  No flowsheet data found. No flowsheet data found. [name], [place] CCC-SLP [phone] Dyanne Iha Riley Nearing 08/29/2015, 2:48 PM              Ct Head Code Stroke W/o Cm  08/15/2015  CLINICAL DATA:  Left-sided weakness.  Code stroke. EXAM: CT HEAD WITHOUT CONTRAST TECHNIQUE: Contiguous axial images were obtained from the base of the skull through the vertex without intravenous contrast. COMPARISON:  None. FINDINGS: Acute right basal ganglia hemorrhage measures 3.7 x 1.9 x 2.0 cm (volume = 7.3 cc). There is mild edema and mass effect on the frontal horn of the right lateral ventricle but no significant midline shift. No intraventricular extension. No additional acute hemorrhage. There is crowding of the basilar cisterns. No acute ischemia elsewhere. The calvarium is intact. Included paranasal sinuses and mastoid air cells are well aerated. IMPRESSION: Acute right basal ganglia hemorrhage measuring 3.7 x 1.9 x 2.0 cm. Mild surrounding edema and mass effect, no midline  shift. There is crowding of the basilar cisterns. Critical Value/emergent results were called by telephone at the time of interpretation on 08/15/2015 at 10:56 pm to Dr. Clayborne Dana, who verbally acknowledged these results. Electronically Signed   By: Rubye Oaks M.D.   On: 08/15/2015 22:56    Labs:  CBC:  Recent Labs  08/27/15 0356 08/28/15 0320 08/29/15 0158 08/30/15 0556  WBC 17.0* 21.0* 18.2* 14.0*  HGB 13.3 12.9* 12.4* 11.3*  HCT 42.5 41.0 39.4 36.7*  PLT 257 292 292 264    COAGS:  Recent Labs  08/15/15 2245 08/23/15 1100  INR 0.98 1.18  APTT 32  --  BMP:  Recent Labs  08/27/15 0356 08/28/15 0320 08/29/15 0158 08/30/15 0556  NA 145 145 143 143  K 3.8 4.1 3.9 4.1  CL 113* 112* 112* 113*  CO2 GLUCOSE 163* 181* 142* 136*  BUN 34* 41* 44* 42*  CALCIUM 9.2 9.1 9.0 8.7*  CREATININE 0.93 1.02 1.17 1.12  GFRNONAA >60 >60 >60 >60  GFRAA >60 >60 >60 >60    LIVER FUNCTION TESTS:  Recent Labs  08/15/15 2245  08/19/15 0525 08/20/15 0548 08/21/15 0555 08/22/15 0500  BILITOT 0.4  --   --   --   --   --   AST 23  --   --   --   --   --   ALT 20  --   --   --   --   --   ALKPHOS 58  --   --   --   --   --   PROT 7.1  --   --   --   --   --   ALBUMIN 4.3  < > 2.9* 2.6* 2.9* 2.5*  < > = values in this interval not displayed.  TUMOR MARKERS: No results for input(s): AFPTM, CEA, CA199, CHROMGRNA in the last 8760 hours.  Assessment and Plan:  Hemorrhagic CVA 5/30 Progression noted Trach New LLE DVT---cannot anticoagulate Scheduled now for retrievable inferior vena cava filter placement Risks and Benefits discussed with the patient's mother including, but not limited to bleeding, infection, contrast induced renal failure, filter fracture or migration which can lead to emergency surgery or even death, strut penetration with damage or irritation to adjacent structures and caval thrombosis. All of herquestions were answered, she is agreeable to  proceed. Consent signed and in chart.   Thank you for this interesting consult.  I greatly enjoyed meeting Brad Mcgee and look forward to participating in their care.  A copy of this report was sent to the requesting provider on this date.  Electronically Signed: [place] Pettengill A 08/31/2015, 10:51 AM   I spent a total of 20 Minutes    in face to face in clinical consultation, greater than 50% of which was counseling/coordinating care for retrievable IVC filter

## 2015-08-31 NOTE — Progress Notes (Addendum)
STROKE TEAM PROGRESS NOTE   SUBJECTIVE (INTERVAL HISTORY) His mother is at the bedside. He had trach change y`day to cuffless LE doppler from today shows left peroneal DVT but Left ankle swelling and redness appears to be decreasing He appears medically stable for discharge to inpatient rehabilitation when bed becomes available   OBJECTIVE Temp:  [97.4 F (36.3 C)-98.7 F (37.1 C)] 98.3 F (36.8 C) (06/15 0943) Pulse Rate:  [76-94] 85 (06/15 1507) Cardiac Rhythm:  [-] Normal sinus rhythm (06/15 1507) Resp:  [15-26] 20 (06/15 1507) BP: (111-156)/(58-80) 112/78 mmHg (06/15 1507) SpO2:  [99 %-100 %] 100 % (06/15 1507) FiO2 (%):  [21 %-28 %] 21 % (06/15 1205) Weight:  [270 lb 8.1 oz (122.7 kg)] 270 lb 8.1 oz (122.7 kg) (06/15 0500)  CBC:   Recent Labs Lab 08/29/15 0158 08/30/15 0556  WBC 18.2* 14.0*  HGB 12.4* 11.3*  HCT 39.4 36.7*  MCV 99.2 101.1*  PLT 292 264    Basic Metabolic Panel:   Recent Labs Lab 08/27/15 0356 08/28/15 0320 08/29/15 0158 08/30/15 0556  NA 145 145 143 143  K 3.8 4.1 3.9 4.1  CL 113* 112* 112* 113*  CO2 GLUCOSE 163* 181* 142* 136*  BUN 34* 41* 44* 42*  CREATININE 0.93 1.02 1.17 1.12  CALCIUM 9.2 9.1 9.0 8.7*  MG 2.4 2.4  --   --   PHOS 4.6 4.4  --   --     PHYSICAL EXAM; Exam is stable today, no changes GENERAL- trach in place, on trach collar, off sedation, appears stated age, not in acute distress, Obese. HEENT- Atraumatic, normocephalic, Pupils reactive and equal CARDIAC- regular rate and rhythm, no murmurs, rubs or gallops PULM:  Decreased breath sounds at bases ABD:  Obese, soft NT EXTREM:  No C/C/E  NEUROLOGICAL EXAM : trach in place, on trach collar, off sedation, eye open, able to follow commands on the right side. Nods yes/no. Eyes open able to communicate using right hand  Pupils 2 mm reactive. Fundi not visualized. Eyes mid position, slightly dysconjugate gaze with mild exotropia left eye and difficulty with  crossing the midline on the left. Eye closure intact but stronger on the right, left facial droop, tongue in mid position  LLE and LUE 0/5 and no spontaneous movement, RUE and RLE 4/5   Sensation:  Patient indicates no difference  Coordination and gait not tested.   ASSESSMENT/PLAN Mr. NELTON AMSDEN is a 30 y.o. male with history of hypertension presenting with L sided weakness. CT showed a L basal ganglia hemorrhage, with change in mental status, pt had repeat Ct head which showed mildly increased size of hmg, and increased mass effect. Pt and was subsequently intubated with difficulty weaning, s/p trach.  Stroke:  Right BG ICH hemorrhage secondary to hypertension   Resultant  L hemiplegia, VDRF  Initial CT R BG hemorrhage 3.7x1.9x2, mild edema & mass effect  Repeat CT with neuro worsening increased size of R BG hmg w/ increased mass effect and cerebral edema.   Repeat CT same.  Stable hematoma. Unchanged shift or edema.  CTA head & neck unremarkable  2D Echo  EF 50-55%. No source of embolus   SCDs and lovenox for VTE prophylaxis Diet NPO time specified  Diet NPO time specified Except for: Sips with Meds, on tube feeds.  No antithrombotic prior to admission, not on antithrombotics due to ICH  Ongoing aggressive stroke risk factor management  Therapy recommendations:  CIR.  Admissions coordinator following  Disposition:  CIR   Possible LLE DVT  L leg edema, improved from yesterday  LE doppler pending   Therapy currently on hold  RN instructed to elevate leg  Acute respiratory failure MSSA bronchitis vs PNA  Intubated in the ED  CCM consulted  Treated with zosyn, narrowed to ancef ( which was discontinued as below). Completed Vanc. Back on zosyn as of yesterday by Dr. Molli KnockYacoub.   Trach placement by CCM 08/23/2015  For cufless Trach today  Dysphagia  secondary to stroke Diet NPO time specified  Diet NPO time specified Except for: Sips with Meds    Medically induced hypernatremia for Cerebral Edema control  Treated with 3%   NA normalized  Rash - allergic  During last week, previous discussion with inpt pharmacy and ancef was considered as most likely culprit, metoprolol less likely. Pt was on lisinopril at home  Thus Ancef discontinued since Abx treatment had been 6 days now  Hypertensive Emergency  BP 182/124 on arrival in setting of acute neurologic symptoms  BP goal < 160   Treated with cardene, now off  metoprolol, lisinopril, triamterene-HCTZ 37.2/25   BP stabilizing  Other Stroke Risk Factors  Morbid Obesity, Body mass index is 42.36 kg/(m^2).   Acute on chronic diastolic CHF, grade 2 chronic CHF  High risk OSA. OP eval recommended  Other Active Problems  Anemia, mild  Hypokalemia, improved today  Loose stools that are c.dif negative.  Improving with Imodium  Hospital day # 15   SETHI,PRAMOD  [facility] See Amion for Pager information 08/31/2015 3:34 PM  I have personally examined this patient, reviewed notes, independently viewed imaging studies, participated in medical decision making and plan of care. I have made any additions or clarifications directly to the above note. Agree with note above. Plan is to  consult interventional radiology to place an IVC filter as I feel the patient is at increased risk for DVT due to his hemiplegia and gait difficulties. Patient is clearly not a candidate for anticoagulation given recent basal ganglia hemorrhage. May need to start him on aspirin in a few days. I had a long discussion with the patient and his mother at the bedside and answered questions. Greater than 50% time during the study 35 minute visit was spent on counseling and coordination of care about his stroke, therapy, DVT and treatment  Delia HeadyPramod Sethi, MD Medical Director [facility] Pager: [phone] 08/31/2015 11:34 PM

## 2015-08-31 NOTE — Progress Notes (Signed)
OT Cancellation Note  Patient Details Name: Brad Mcgee MRN: [id number] DOB: 08/27/1985   Cancelled Treatment:    Reason Eval/Treat Not Completed: Patient at procedure or test/ unavailable. Pt having IVC filter placed this pm. Will see in am.  [facility] Campus[facility],HILLARY  Jasslyn Finkel, OTR/L  [phone] 08/31/2015 08/31/2015, 2:40 PM

## 2015-08-31 NOTE — Progress Notes (Signed)
I [facility] Annie MainSharon Biby, [facility] concerning need for dopplers this morning and she did follow up to obtain dopplers. Noted positive for DVT. Plan per Jasmine DecemberSharon is for IVC filter placement. I have notified OT of results and need for therapy assessments after filter placement so that [facility] could be pursued for a possible inpt rehab admission pending insurance authorization. I will follow up today. [phone]

## 2015-08-31 NOTE — Progress Notes (Signed)
Patient currently in IR. RT will check trach as soon as patient is back in room. RT will continue to monitor.

## 2015-09-01 ENCOUNTER — Inpatient Hospital Stay (HOSPITAL_COMMUNITY)
Admission: AD | Admit: 2015-09-01 | Discharge: 2015-09-23 | DRG: 057 | Disposition: A | Discharge status: Home / Self Care | Payer: 59 | Source: Intra-hospital | Attending: Physical Medicine & Rehabilitation | Admitting: Physical Medicine & Rehabilitation

## 2015-09-01 ENCOUNTER — Encounter (HOSPITAL_COMMUNITY): Payer: Self-pay | Admitting: Physical Medicine and Rehabilitation

## 2015-09-01 DIAGNOSIS — Z6841 Body Mass Index (BMI) 40.0 and over, adult: Secondary | ICD-10-CM

## 2015-09-01 DIAGNOSIS — I619 Nontraumatic intracerebral hemorrhage, unspecified: Secondary | ICD-10-CM | POA: Diagnosis present

## 2015-09-01 DIAGNOSIS — Z930 Tracheostomy status: Secondary | ICD-10-CM

## 2015-09-01 DIAGNOSIS — B9689 Other specified bacterial agents as the cause of diseases classified elsewhere: Secondary | ICD-10-CM | POA: Diagnosis present

## 2015-09-01 DIAGNOSIS — E871 Hypo-osmolality and hyponatremia: Secondary | ICD-10-CM | POA: Insufficient documentation

## 2015-09-01 DIAGNOSIS — E662 Morbid (severe) obesity with alveolar hypoventilation: Secondary | ICD-10-CM | POA: Diagnosis present

## 2015-09-01 DIAGNOSIS — I69191 Dysphagia following nontraumatic intracerebral hemorrhage: Secondary | ICD-10-CM

## 2015-09-01 DIAGNOSIS — Z430 Encounter for attention to tracheostomy: Secondary | ICD-10-CM | POA: Diagnosis not present

## 2015-09-01 DIAGNOSIS — I82452 Acute embolism and thrombosis of left peroneal vein: Secondary | ICD-10-CM | POA: Diagnosis not present

## 2015-09-01 DIAGNOSIS — N390 Urinary tract infection, site not specified: Secondary | ICD-10-CM | POA: Diagnosis present

## 2015-09-01 DIAGNOSIS — I69359 Hemiplegia and hemiparesis following cerebral infarction affecting unspecified side: Secondary | ICD-10-CM | POA: Diagnosis present

## 2015-09-01 DIAGNOSIS — I10 Essential (primary) hypertension: Secondary | ICD-10-CM | POA: Diagnosis not present

## 2015-09-01 DIAGNOSIS — Z8673 Personal history of transient ischemic attack (TIA), and cerebral infarction without residual deficits: Secondary | ICD-10-CM | POA: Diagnosis present

## 2015-09-01 DIAGNOSIS — Z87891 Personal history of nicotine dependence: Secondary | ICD-10-CM | POA: Diagnosis not present

## 2015-09-01 DIAGNOSIS — I110 Hypertensive heart disease with heart failure: Secondary | ICD-10-CM | POA: Diagnosis present

## 2015-09-01 DIAGNOSIS — I69319 Unspecified symptoms and signs involving cognitive functions following cerebral infarction: Secondary | ICD-10-CM | POA: Diagnosis not present

## 2015-09-01 DIAGNOSIS — I610 Nontraumatic intracerebral hemorrhage in hemisphere, subcortical: Secondary | ICD-10-CM

## 2015-09-01 DIAGNOSIS — G4733 Obstructive sleep apnea (adult) (pediatric): Secondary | ICD-10-CM

## 2015-09-01 DIAGNOSIS — I509 Heart failure, unspecified: Secondary | ICD-10-CM | POA: Diagnosis present

## 2015-09-01 DIAGNOSIS — Z7901 Long term (current) use of anticoagulants: Secondary | ICD-10-CM | POA: Diagnosis not present

## 2015-09-01 DIAGNOSIS — E66813 Obesity, class 3: Secondary | ICD-10-CM | POA: Diagnosis present

## 2015-09-01 DIAGNOSIS — G8194 Hemiplegia, unspecified affecting left nondominant side: Secondary | ICD-10-CM | POA: Insufficient documentation

## 2015-09-01 DIAGNOSIS — I8291 Chronic embolism and thrombosis of unspecified vein: Secondary | ICD-10-CM | POA: Diagnosis present

## 2015-09-01 DIAGNOSIS — D696 Thrombocytopenia, unspecified: Secondary | ICD-10-CM | POA: Insufficient documentation

## 2015-09-01 DIAGNOSIS — I69354 Hemiplegia and hemiparesis following cerebral infarction affecting left non-dominant side: Principal | ICD-10-CM

## 2015-09-01 DIAGNOSIS — I82409 Acute embolism and thrombosis of unspecified deep veins of unspecified lower extremity: Secondary | ICD-10-CM

## 2015-09-01 DIAGNOSIS — I82492 Acute embolism and thrombosis of other specified deep vein of left lower extremity: Secondary | ICD-10-CM

## 2015-09-01 DIAGNOSIS — N179 Acute kidney failure, unspecified: Secondary | ICD-10-CM | POA: Diagnosis not present

## 2015-09-01 DIAGNOSIS — D62 Acute posthemorrhagic anemia: Secondary | ICD-10-CM | POA: Diagnosis present

## 2015-09-01 DIAGNOSIS — I69391 Dysphagia following cerebral infarction: Secondary | ICD-10-CM | POA: Diagnosis not present

## 2015-09-01 LAB — CULTURE, BLOOD (ROUTINE X 2)
Culture: NO GROWTH
Culture: NO GROWTH

## 2015-09-01 LAB — GLUCOSE, CAPILLARY
Glucose-Capillary: 101 mg/dL — ABNORMAL HIGH (ref 65–99)
Glucose-Capillary: 104 mg/dL — ABNORMAL HIGH (ref 65–99)
Glucose-Capillary: 91 mg/dL (ref 65–99)

## 2015-09-01 MED ORDER — ENOXAPARIN SODIUM 60 MG/0.6ML ~~LOC~~ SOLN
60.0000 mg | SUBCUTANEOUS | Status: DC
  Administered 2015-09-02 – 2015-09-18 (×17): 60 mg via SUBCUTANEOUS
  Filled 2015-09-01 (×16): qty 0.6

## 2015-09-01 MED ORDER — CHLORHEXIDINE GLUCONATE 0.12 % MT SOLN
15.0000 mL | Freq: Two times a day (BID) | OROMUCOSAL | Status: DC
  Administered 2015-09-01 – 2015-09-15 (×25): 15 mL via OROMUCOSAL
  Filled 2015-09-01 (×25): qty 15

## 2015-09-01 MED ORDER — ENOXAPARIN SODIUM 60 MG/0.6ML ~~LOC~~ SOLN
60.0000 mg | SUBCUTANEOUS | Status: DC
  Filled 2015-09-01: qty 0.6

## 2015-09-01 MED ORDER — CIPROFLOXACIN IN D5W 400 MG/200ML IV SOLN: 400.0000 mg | Freq: Two times a day (BID) | INTRAVENOUS | Status: DC

## 2015-09-01 MED ORDER — LISINOPRIL 20 MG PO TABS
20.0000 mg | ORAL_TABLET | Freq: Two times a day (BID) | ORAL | Status: DC
  Administered 2015-09-01 – 2015-09-23 (×40): 20 mg via ORAL
  Filled 2015-09-01 (×41): qty 1
  Filled 2015-09-01: qty 2
  Filled 2015-09-01 (×3): qty 1

## 2015-09-01 MED ORDER — LIDOCAINE HCL 2 % EX GEL
CUTANEOUS | Status: DC | PRN
  Filled 2015-09-01 (×2): qty 5

## 2015-09-01 MED ORDER — ALUM & MAG HYDROXIDE-SIMETH 200-200-20 MG/5ML PO SUSP: 30.0000 mL | ORAL | Status: DC | PRN

## 2015-09-01 MED ORDER — ONDANSETRON HCL 4 MG/2ML IJ SOLN: 4.0000 mg | Freq: Four times a day (QID) | INTRAMUSCULAR | Status: DC | PRN

## 2015-09-01 MED ORDER — DEXTROSE 5 % IV SOLN: 1.0000 g | INTRAVENOUS | Status: DC

## 2015-09-01 MED ORDER — GUAIFENESIN-DM 100-10 MG/5ML PO SYRP
5.0000 mL | ORAL_SOLUTION | Freq: Four times a day (QID) | ORAL | Status: DC | PRN
  Administered 2015-09-02 – 2015-09-03 (×2): 10 mL via ORAL
  Filled 2015-09-01 (×2): qty 10

## 2015-09-01 MED ORDER — BISACODYL 10 MG RE SUPP: 10.0000 mg | Freq: Every day | RECTAL | Status: DC | PRN

## 2015-09-01 MED ORDER — ACETAMINOPHEN 325 MG PO TABS
325.0000 mg | ORAL_TABLET | ORAL | Status: DC | PRN
  Administered 2015-09-01 – 2015-09-16 (×6): 650 mg via ORAL
  Filled 2015-09-01 (×5): qty 2

## 2015-09-01 MED ORDER — ONDANSETRON HCL 4 MG PO TABS: 4.0000 mg | ORAL_TABLET | Freq: Four times a day (QID) | ORAL | Status: DC | PRN

## 2015-09-01 MED ORDER — RESOURCE THICKENUP CLEAR PO POWD
ORAL | Status: DC | PRN
  Filled 2015-09-01: qty 125

## 2015-09-01 MED ORDER — METHOCARBAMOL 500 MG PO TABS
500.0000 mg | ORAL_TABLET | Freq: Four times a day (QID) | ORAL | Status: DC | PRN
  Administered 2015-09-08: 500 mg via ORAL
  Filled 2015-09-01: qty 1

## 2015-09-01 MED ORDER — TRAZODONE HCL 50 MG PO TABS
25.0000 mg | ORAL_TABLET | Freq: Every evening | ORAL | Status: DC | PRN
  Administered 2015-09-03: 50 mg via ORAL
  Filled 2015-09-01: qty 1

## 2015-09-01 MED ORDER — TRIAMTERENE-HCTZ 37.5-25 MG PO TABS
1.0000 | ORAL_TABLET | Freq: Every day | ORAL | Status: DC
  Administered 2015-09-02 – 2015-09-09 (×8): 1 via ORAL
  Filled 2015-09-01 (×9): qty 1

## 2015-09-01 MED ORDER — ADULT MULTIVITAMIN W/MINERALS CH
1.0000 | ORAL_TABLET | Freq: Every day | ORAL | Status: DC
  Administered 2015-09-02 – 2015-09-23 (×22): 1 via ORAL
  Filled 2015-09-01 (×22): qty 1

## 2015-09-01 MED ORDER — CLONIDINE HCL 0.1 MG PO TABS: 0.1000 mg | ORAL_TABLET | ORAL | Status: DC | PRN

## 2015-09-01 MED ORDER — DOCUSATE SODIUM 50 MG/5ML PO LIQD
100.0000 mg | Freq: Two times a day (BID) | ORAL | Status: DC | PRN
  Administered 2015-09-10: 100 mg via ORAL
  Filled 2015-09-01 (×4): qty 10

## 2015-09-01 MED ORDER — DIPHENHYDRAMINE HCL 12.5 MG/5ML PO ELIX: 12.5000 mg | ORAL_SOLUTION | Freq: Four times a day (QID) | ORAL | Status: DC | PRN

## 2015-09-01 MED ORDER — CETYLPYRIDINIUM CHLORIDE 0.05 % MT LIQD
7.0000 mL | Freq: Two times a day (BID) | OROMUCOSAL | Status: DC
  Administered 2015-09-03 – 2015-09-16 (×15): 7 mL via OROMUCOSAL

## 2015-09-01 MED ORDER — PRO-STAT SUGAR FREE PO LIQD
30.0000 mL | Freq: Two times a day (BID) | ORAL | Status: DC
  Administered 2015-09-01 – 2015-09-15 (×26): 30 mL via ORAL
  Filled 2015-09-01 (×28): qty 30

## 2015-09-01 MED ORDER — ENOXAPARIN SODIUM 60 MG/0.6ML ~~LOC~~ SOLN
60.0000 mg | SUBCUTANEOUS | Status: DC
  Administered 2015-09-01: 60 mg via SUBCUTANEOUS
  Filled 2015-09-01: qty 0.6

## 2015-09-01 MED ORDER — FAMOTIDINE 40 MG/5ML PO SUSR
40.0000 mg | Freq: Every day | ORAL | Status: DC
  Administered 2015-09-01 – 2015-09-21 (×19): 40 mg via ORAL
  Filled 2015-09-01 (×22): qty 5

## 2015-09-01 MED ORDER — FLEET ENEMA 7-19 GM/118ML RE ENEM
1.0000 | ENEMA | Freq: Once | RECTAL | Status: DC | PRN
Start: 2015-09-01 — End: 2015-09-23

## 2015-09-01 NOTE — Care Management Note (Signed)
Case Management Note  Patient Details  Name: [name] M Kuhrt MRN: [id number] Date of Birth: 05/21/1985  Subjective/Objective:                    Action/Plan: Plan is for patient to discharge to CIR today. No further needs per CM.   Expected Discharge Date:                  Expected Discharge Plan:  IP Rehab Facility  In-House Referral:     Discharge planning Services  CM Consult  Post Acute Care Choice:    Choice offered to:     DME Arranged:    DME Agency:     HH Arranged:    HH Agency:     Status of Service:  In process, will continue to follow  Medicare Important Message Given:    Date Medicare IM Given:    Medicare IM give by:    Date Additional Medicare IM Given:    Additional Medicare Important Message give by:     If discussed at Long Length of Stay Meetings, dates discussed:    Additional Comments:  Kermit BaloKelli F Terrica Duecker, RN 09/01/2015, 1:03 PM

## 2015-09-01 NOTE — Progress Notes (Signed)
Standley Brooking, RN Rehab Admission Coordinator Signed Physical Medicine and Rehabilitation PMR Pre-admission 09/01/2015 11:33 AM  Related encounter: ED to Hosp-Admission (Current) from 08/15/2015 in [facility]    Expand All Collapse All   PMR Admission Coordinator Pre-Admission Assessment  Patient: Brad Mcgee is an 30 y.o., male MRN: [id number] DOB: [birth year] Height: 5\' 7"  (170.2 cm) Weight: 126.3 kg (278 lb 7.1 oz)  Insurance Information HMO: PPO: PCP: IPA: 80/20: OTHER:  PRIMARY: [employer] Policy#: [id number] Subscriber: pt CM Name: Felicity Coyer. Phone#: [phone] Fax#: Epic access Pre-Cert#: [id number] Employer: [employer] approved for 7 days Benefits: Phone #: [phone] Name: 08/24/15 Eff. Date: 03/19/15 Deduct: $1500 Out of Pocket Max: $4000 Life Max: none CIR: 80% SNF: 80 60 days Outpatient: 80% Co-Pay: 60 visits combined Home Health: 80% Co-Pay: 20% DME: 80% Co-Pay: 20% Providers: in network  SECONDARY: none   Medicaid Application Date: Case Manager:  Disability Application Date: Case Worker:   Emergency Contact Information Contact Information    Name Relation Home Work Mobile   [place] Father [phone]     [place] [phone]     Daye,Sheidan Mother   [phone]   Denton Meek   [phone]     Current Medical History  Patient Admitting Diagnosis: right BG ICH with left hemiparesis  History of Present Illness: Brad Mcgee is a 30 y.o. male with history of hypertension was admitted to [facility] on 08/15/15 with acute onset of left sided weakness. CT head done revealing left basal ganglia infarct and he had decline in MS  requiring intubation and transfer to [facility]. Bleed felt to be due to hypertensive emergency and he did have increase in bleed on follow up CT. CTA head and neck negative for stenosis or aneurysm. 2D echo with eF 50-55%, no wall abnormality, grade 2 diastolic dysfunction and no SOE. He was treated with hypertonic saline and required trach 06/7 due to difficulty with vent wean. He was extubated to ATC but continues to have copious secretions. He developed fever 6/11 therefore started on Vanc/Zosyn and blood cultures done negative to date. Urine culture positive for enterobacter and antibiotics narrowed to rocephin on 06/14. BLE dopplers done due to edema and revealed LLE peroneal DVT. IVC filter placed on 06/15. He is tolerating PMSV trials and was cleared to start dyshagia 1,nectar liquids as mentation improving.   Total: 12 NIH  Past Medical History  Past Medical History  Diagnosis Date  . Hypertension   . Fracture of left ankle     treated with cast    Family History  family history includes Hypertension in his mother.  Prior Rehab/Hospitalizations:  Has the patient had major surgery during 100 days prior to admission? No  Current Medications   Current facility-administered medications:  . 0.9 % sodium chloride infusion, , Intravenous, Continuous, Oretha Milch, MD, Last Rate: 50 mL/hr at 08/31/15 0111 . acetaminophen (TYLENOL) solution 650 mg, 650 mg, Oral, Q4H PRN, 650 mg at 08/31/15 1219 **OR** acetaminophen (TYLENOL) suppository 650 mg, 650 mg, Rectal, Q4H PRN, Marvel Plan, MD, 650 mg at 08/29/15 1249 . antiseptic oral rinse (CPC / CETYLPYRIDINIUM CHLORIDE 0.05%) solution 7 mL, 7 mL, Mouth Rinse, q12n4p, Micki Riley, MD, 7 mL at 08/30/15 1538 . bisacodyl (DULCOLAX) suppository 10 mg, 10 mg, Rectal, Daily PRN, Coralyn Helling, MD . chlorhexidine (PERIDEX) 0.12 % solution 15 mL, 15 mL, Mouth Rinse, BID, Micki Riley, MD, 15 mL at 08/31/15 1142 .  docusate (COLACE)  50 MG/5ML liquid 100 mg, 100 mg, Per Tube, BID PRN, Coralyn HellingVineet Sood, MD . enoxaparin (LOVENOX) injection 60 mg, 60 mg, Subcutaneous, Q24H, Micki RileyPramod S Sethi, MD . famotidine (PEPCID) 40 MG/5ML suspension 40 mg, 40 mg, Per Tube, QHS, Roslynn AmbleJennings E Nestor, MD, 40 mg at 08/31/15 2233 . feeding supplement (PRO-STAT SUGAR FREE 64) liquid 30 mL, 30 mL, Oral, BID, Micki RileyPramod S Sethi, MD, 30 mL at 08/31/15 1143 . fentaNYL (SUBLIMAZE) injection 25-50 mcg, 25-50 mcg, Intravenous, Q4H PRN, Leslye Peerobert S Byrum, MD, 50 mcg at 08/25/15 2004 . fentaNYL (SUBLIMAZE) injection, , Intravenous, PRN, Jolaine ClickArthur Hoss, MD, 25 mcg at 08/31/15 1445 . labetalol (NORMODYNE,TRANDATE) injection 10-40 mg, 10-40 mg, Intravenous, Q10 min PRN, Micki RileyPramod S Sethi, MD, 20 mg at 08/19/15 0024 . lisinopril (PRINIVIL,ZESTRIL) tablet 20 mg, 20 mg, Oral, BID, Marvel PlanJindong Xu, MD, 20 mg at 08/31/15 1142 . loperamide (IMODIUM) 1 MG/5ML solution 2 mg, 2 mg, Oral, Q8H PRN, Consuella Losehere M Gregory, MD, 2 mg at 08/26/15 1035 . metoprolol tartrate (LOPRESSOR) 25 mg/10 mL oral suspension 50 mg, 50 mg, Per Tube, BID, Marvel PlanJindong Xu, MD, 50 mg at 08/31/15 1144 . midazolam (VERSED) injection, , Intravenous, PRN, Jolaine ClickArthur Hoss, MD, 1 mg at 08/31/15 1445 . multivitamin liquid 15 mL, 15 mL, Per Tube, Daily, Marvel PlanJindong Xu, MD, 15 mL at 08/31/15 1143 . piperacillin-tazobactam (ZOSYN) IVPB 3.375 g, 3.375 g, Intravenous, Q8H, Alyson ReedyWesam G Yacoub, MD, 3.375 g at 09/01/15 0557 . RESOURCE THICKENUP CLEAR, , Oral, PRN, Micki RileyPramod S Sethi, MD . triamterene-hydrochlorothiazide (MAXZIDE-25) 37.5-25 MG per tablet 1 tablet, 1 tablet, Oral, Daily, Micki RileyPramod S Sethi, MD, 1 tablet at 08/31/15 1142  Patients Current Diet: DIET - DYS 1 Room service appropriate?: Yes; Fluid consistency:: Nectar Thick  Precautions / Restrictions Precautions Precautions: Fall Precaution Comments: watch BP, and O2 saturations on ATC Restrictions Weight Bearing Restrictions: No   Has the patient had 2 or more falls or a fall with  injury in the past year?No  Prior Activity Level Community (5-7x/wk): Independent and working. Lived with Girlfriend  Journalist, newspaperHome Assistive Devices / Equipment Home Assistive Devices/Equipment: None Home Equipment: None  Prior Device Use: Indicate devices/aids used by the patient prior to current illness, exacerbation or injury? None of the above  Prior Functional Level Prior Function Level of Independence: Independent  Self Care: Did the patient need help bathing, dressing, using the toilet or eating? Independent  Indoor Mobility: Did the patient need assistance with walking from room to room (with or without device)? Independent  Stairs: Did the patient need assistance with internal or external stairs (with or without device)? Independent  Functional Cognition: Did the patient need help planning regular tasks such as shopping or remembering to take medications? Independent  Current Functional Level Cognition  Overall Cognitive Status: Impaired/Different from baseline Difficult to assess due to: Tracheostomy (with PMV) Current Attention Level: Sustained Orientation Level: Oriented to person, Oriented to place, Oriented to situation Following Commands: Follows one step commands with increased time Safety/Judgement: Decreased awareness of deficits General Comments: Pt needs incr time and tactile input to initiate L side. pt asking to "walk" with decr awareness of ability to get up and walk at this time.    Extremity Assessment (includes Sensation/Coordination)  Upper Extremity Assessment: LUE deficits/detail LUE Deficits / Details: Flaccid LUE with no active movement. PROM WFL. Reports diminished sensation LUE Sensation: decreased light touch, decreased proprioception LUE Coordination: decreased fine motor, decreased gross motor  Lower Extremity Assessment: Defer to PT evaluation LLE Deficits / Details:  flaccid LLE, patient reports sensation inconsistently, question reliablity  of response LLE Sensation: decreased light touch, decreased proprioception LLE Coordination: decreased fine motor, decreased gross motor    ADLs  Overall ADL's : Needs assistance/impaired Eating/Feeding: NPO Grooming: Wash/dry face, Minimal assistance Grooming Details (indicate cue type and reason): pt able to wipe face but needs (A) for static sitting balance pt with posterior lean Upper Body Bathing: Maximal assistance Upper Body Bathing Details (indicate cue type and reason): mother present and pt raising R UE and (A) L UE into flexion for hygiene Lower Body Bathing: Total assistance Upper Body Dressing : Maximal assistance, Sitting Lower Body Dressing: Total assistance Toilet Transfer: +2 for physical assistance, Maximal assistance, Squat-pivot, Cueing for safety Toilet Transfer Details (indicate cue type and reason): pt sitting prematurely and needed (A) to swing hips General ADL Comments: Pt initiated exiting the bed and facilitation to activate L side. pt initiate trunk and hip with log rolling Pt upon sitting working on static sitting balance. pt able to initiate scapula retraction neck extension and midline static posture. Pt required 2 pillows L side to support during static sitting.     Mobility  Overal bed mobility: Needs Assistance Bed Mobility: Supine to Sit, Rolling Rolling: Mod assist (initiated with core this session to exit on R side) Sidelying to sit: Max assist, +2 for physical assistance Supine to sit: Max assist, +2 for physical assistance, +2 for safety/equipment, HOB elevated Sit to supine: Total assist, +2 for physical assistance Sit to sidelying: Max assist General bed mobility comments: Pt needed (A) for bil LE and cues for pushing with R UE. pt needed cues to avoid over pushing to keep midline posture    Transfers  Overall transfer level: Needs assistance Equipment used: (back of recliner in front of pt for stability in standing) Transfers: Sit  to/from Stand Sit to Stand: +2 physical assistance, Max assist, From elevated surface Stand pivot transfers: Max assist, +2 physical assistance, Total assist General transfer comment: Pt initiated sitting and powering up with R side of body. pt needs (A) to block R knee. bracing L leg and max cue/facilitation for trunk and hip extension; used back of chair for support in standing with R UE and L UE placed there for positioning/posture, able to extend up off chair with cues and facilitation to trunk; stood twice for total of about 60 seconds including weight shifts R/L; stand pivot to chair with +2 total A due to pt fatigued from standing trials and did not move L foot to ensure closer to chair so lowered to chair and assistd to scoot back    Ambulation / Gait / Stairs / Wheelchair Mobility       Posture / Balance Dynamic Sitting Balance Sitting balance - Comments: sat EOB about 10 minutes working on facilitating upright posture with cervical extension, scapular retraction and anterior pelvic tilt; pt able to maintain with supervision for 10 seconds x 2; but fatigues; tends to push with R UE to L and posterior until cued to place on R knee, but also falls anteriorly to L when L arm falls off pillows supporting it. Balance Overall balance assessment: Needs assistance Sitting-balance support: Bilateral upper extremity supported, Feet supported Sitting balance-Leahy Scale: Poor Sitting balance - Comments: sat EOB about 10 minutes working on facilitating upright posture with cervical extension, scapular retraction and anterior pelvic tilt; pt able to maintain with supervision for 10 seconds x 2; but fatigues; tends to push with R UE to L and  posterior until cued to place on R knee, but also falls anteriorly to L when L arm falls off pillows supporting it. Postural control: Posterior lean Standing balance-Leahy Scale: Zero Standing balance comment: +2 (A0 wtih R Ue support recliner and bil LE  blocked    Special needs/care consideration BiPAP/CPAP PCCM states pt will need CPAP if decannulated. NEW this admit Oxygen 21% trach collar Special Bed overlay Trach Size #6 cuffless Skin intact Bowel mgmt: LBM 6/10 loose Bladder mgmt: indwelling catheter with complaints of spasms and need to void Communication board needed when PMSV not in use   Previous Home Environment Living Arrangements: (girlfriend pta) Lives With: Significant other Available Help at Discharge: Family Type of Home: House Home Layout: One level Home Access: Stairs to enter Entrance Stairs-Rails: None Secretary/administrator of Steps: 3 Bathroom Shower/Tub: Hydrographic surveyor, Health visitor: Standard Bathroom Accessibility: Yes How Accessible: Accessible via walker Home Care Services: No Additional Comments: Very supportive family. Father reports they can make home modifications as needed.  Discharge Living Setting Plans for Discharge Living Setting: Lives with (comment) (pt plans to d/c home with his Mom and Grandmother to help) Type of Home at Discharge: House Discharge Home Layout: One level Discharge Home Access: Stairs to enter Entrance Stairs-Rails: None Entrance Stairs-Number of Steps: 2-3 steps onto front porch then one step Discharge Bathroom Shower/Tub: Tub/shower unit, Curtain Discharge Bathroom Toilet: Standard Discharge Bathroom Accessibility: Yes How Accessible: Accessible via walker Does the patient have any problems obtaining your medications?: No  Social/Family/Support Systems Patient Roles: Partner, Other (Comment) (fulltime employee) Contact Information: Mom Anticipated Caregiver: Mom, grandmother, Dad and family Anticipated Caregiver's Contact Information: see above Ability/Limitations of Caregiver: family all work but are Designer, television/film set Availability: 24/7 Discharge Plan Discussed with Primary Caregiver: Yes Is Caregiver In Agreement with Plan?:  Yes Does Caregiver/Family have Issues with Lodging/Transportation while Pt is in Rehab?: No Dad and Mom rotate during the day staying with pt. They are not married. Pt plans to d/c home to Mom's home with Grandmother and multiple family members to assist.  Goals/Additional Needs Patient/Family Goal for Rehab: supervision to min assist PT and OT, Mod I to supervision with SLP Expected length of stay: ELOS 18-24 days Special Service Needs: Needs some type of communication board for when PMSV not in use for communication with family and stafff Pt/Family Agrees to Admission and willing to participate: Yes Program Orientation Provided & Reviewed with Pt/Caregiver Including Roles & Responsibilities: Yes  Decrease burden of Care through IP rehab admission: n/a  Possible need for SNF placement upon discharge:not anticipated  Patient Condition: This patient's medical and functional status has changed since the consult dated: 08/24/2015 in which the Rehabilitation Physician determined and documented that the patient's condition is appropriate for intensive rehabilitative care in an inpatient rehabilitation facility. See "History of Present Illness" (above) for medical update. Functional changes are: max to total assist. Patient's medical and functional status update has been discussed with the Rehabilitation physician and patient remains appropriate for inpatient rehabilitation. Will admit to inpatient rehab today.  Preadmission Screen Completed By: Clois Dupes, 09/01/2015 11:56 AM ______________________________________________________________________  Discussed status with Dr. Riley Kill on 09/01/2015 at 1155 and received telephone approval for admission today.  Admission Coordinator: Clois Dupes, time 1155 Date 09/01/15.          Cosigned by: Ranelle Oyster, MD at 09/01/2015 1:26 PM  Revision History     Date/Time User Provider Type Action   09/01/2015 1:26 PM  Ranelle Oyster, MD Physician Cosign   09/01/2015 11:56 AM Standley Brooking, RN Rehab Admission Coordinator Sign

## 2015-09-01 NOTE — Progress Notes (Signed)
Physical Therapy Treatment Patient Details Name: Brad Mcgee MRN: [id number] DOB: 06/27/1985 Today's Date: 09/01/2015    History of Present Illness 30 yo male with slurred speech and Lt sided weakness. This was associated with headache. He was intubated for airway protection. He had CT head which showed 3.7 x 1.9 x 2 cm Rt basal ganglia ICH which then increased to 4.5 x 1.9 x 3.4 cm ICH with herniation of cerebellar tonsils due to mass effect vs chiari malformation on f/u CT head. S/p trach on 08/23/15    PT Comments    Patient progressing this session with standing trials and OOB via stand pivot.  Still fatigues with activity, but feel very appropriate for CIR level rehab with supportive family, motivation and progress during acute stay.  Follow Up Recommendations  CIR;Supervision/Assistance - 24 hour     Equipment Recommendations  Other (comment) (TBA)    Recommendations for Other Services       Precautions / Restrictions Precautions Precautions: Fall    Mobility  Bed Mobility Overal bed mobility: Needs Assistance Bed Mobility: Rolling;Sidelying to Sit Rolling: Mod assist;Max assist Sidelying to sit: Max assist;+2 for safety/equipment       General bed mobility comments: assist, and increaed time to initiate with L shoulder and L LE for rolling with HOB elevated, assist for legs off bed and cue to use L elbow in sidelying to push up   Transfers Overall transfer level: Needs assistance Equipment used:  (back of recliner in front of pt for stability in standing) Transfers: Sit to/from UGI CorporationStand;Stand Pivot Transfers Sit to Stand: +2 physical assistance;Max assist;From elevated surface Stand pivot transfers: Max assist;+2 physical assistance;Total assist (+ another to hold chair)       General transfer comment: pt able to initiate sit to stand, but impulsively prior to proper set up; needed support for L arm and bracing L leg and max cue/facilitation for trunk and hip  extension; used back of chair for support in standing with R UE and L UE placed there for positioning/posture, able to extend up off chair with cues and facilitation to trunk; stood twice for total of about 60 seconds including weight shifts R/L; stand pivot to chair with +2 total A due to pt fatigued from standing trials and did not move L foot to ensure closer to chair so lowered to chair and assistd to scoot back  Ambulation/Gait                 Stairs            Wheelchair Mobility    Modified Rankin (Stroke Patients Only) Modified Rankin (Stroke Patients Only) Pre-Morbid Rankin Score: No symptoms Modified Rankin: Severe disability     Balance Overall balance assessment: Needs assistance Sitting-balance support: Bilateral upper extremity supported;Feet supported Sitting balance-Leahy Scale: Poor Sitting balance - Comments: sat EOB about 10 minutes working on facilitating upright posture with cervical extension, scapular retraction and anterior pelvic tilt; pt able to maintain with supervision for 10 seconds x 2; but fatigues; tends to push with R UE to L and posterior until cued to place on R knee, but also falls anteriorly to L when L arm falls off pillows supporting it.     Standing balance-Leahy Scale: Zero Standing balance comment: +2 A and R UE supported on back of recliner for standing trials x 2 combined about 60 seconds  Cognition Arousal/Alertness: Awake/alert Behavior During Therapy: WFL for tasks assessed/performed Overall Cognitive Status: Impaired/Different from baseline Area of Impairment: Attention;Safety/judgement;Awareness;Problem solving   Current Attention Level: Sustained   Following Commands: Follows multi-step commands with increased time Safety/Judgement: Decreased awareness of safety;Decreased awareness of deficits Awareness: Emergent Problem Solving: Difficulty sequencing;Requires verbal cues;Requires tactile  cues      Exercises General Exercises - Lower Extremity Heel Slides: AAROM;Left;Supine;5 reps (with resisted extension to assist with LE activation)    General Comments General comments (skin integrity, edema, etc.): mother in the room washing L armpit and noted skin peeling, OT present for education on skin care      Pertinent Vitals/Pain Pain Assessment: No/denies pain    Home Living                      Prior Function            PT Goals (current goals can now be found in the care plan section) Progress towards PT goals: Progressing toward goals    Frequency  Min 4X/week    PT Plan Current plan remains appropriate    Co-evaluation PT/OT/SLP Co-Evaluation/Treatment: Yes Reason for Co-Treatment: For patient/therapist safety;Complexity of the patient's impairments (multi-system involvement) PT goals addressed during session: Mobility/safety with mobility;Balance       End of Session Equipment Utilized During Treatment: Gait belt;Oxygen (28% TC) Activity Tolerance: Patient limited by fatigue Patient left: with chair alarm set;in chair     Time: 0930-1006 PT Time Calculation (min) (ACUTE ONLY): 36 min  Charges:  $Therapeutic Activity: 8-22 mins                    G Codes:      Elray Mcgregor Sep 14, 2015, 10:55 AM  Sheran Lawless, PT [phone] 09/14/15

## 2015-09-01 NOTE — Progress Notes (Signed)
Patient ID: [name] M Suydam, male   DOB: 06/18/1985, 30 y.o.   MRN: [id number] Patient admitted to [phone] via bed, escorted by nursing staff and family.  Patient and family verbalized understanding of rehab process, signed fall safety agreement.  Appears to be in no immediate distress at this time.  Trach care performed.  Dani Gobbleeardon, Honey Zakarian J, RN

## 2015-09-01 NOTE — Progress Notes (Signed)
Ranelle Oyster, MD Physician Signed Physical Medicine and Rehabilitation Consult Note 08/24/2015 10:34 AM  Related encounter: ED to Hosp-Admission (Current) from 08/15/2015 in [facility] CENTRAL NEURO SURGICAL    Expand All Collapse All        Physical Medicine and Rehabilitation Consult Reason for Consult: Right basal ganglia ICH secondary to hypertensive crisis Referring Physician: Dr.Xu   HPI: Brad Mcgee is a 30 y.o. right handed male with history of hypertension. Presented 08/16/2015 with left-sided weakness and headache. Per chart review patient lives with spouse independent prior to admission. One level home with 3 steps to entry. Urine drug screen negative. Blood pressure 187/106. CT of the head showed acute right basal ganglia hemorrhage measuring 3.7 x 1.9 x 2.0 cm with surrounding edema and mass effect. No midline shift. Follow-up CT 08/16/2015 of the head showed increased size of hemorrhage with increased mass effect as well as 2 mm right-to-left midline shift and repeated 08/20/2015 again with right basal ganglia increased in size 5.4 x 1.9 cm.. Neurology consulted. Patient did require intubation for airway protection. Echocardiogram with ejection fraction of 55% grade 2 diastolic dysfunction. CTA of head and neck negative for stenosis or dissection. Patient with prolonged intubation and underwent tracheostomy placement #6 cuffed Shiley trach 08/23/2015 per Dr. Molli Knock. Subcutaneous Lovenox for DVT prophylaxis. Patient is nothing by mouth with alternative means of nutrition. Physical therapy evaluation completed 08/24/2015 with recommendations of physical medicine rehabilitation consult.   Review of Systems  Unable to perform ROS: mental acuity   Past Medical History  Diagnosis Date  . Hypertension    History reviewed. No pertinent past surgical history. Family History  Problem Relation Age of Onset  . Hypertension Mother    Social History:   reports that he has never smoked. He has never used smokeless tobacco. He reports that he does not drink alcohol or use illicit drugs. Allergies:  Allergies  Allergen Reactions  . Bee Venom Anaphylaxis  . Coconut Oil Anaphylaxis   Medications Prior to Admission  Medication Sig Dispense Refill  . lisinopril (PRINIVIL,ZESTRIL) 10 MG tablet Take 10 mg by mouth daily.    . naproxen (NAPROSYN) 500 MG tablet Take 1 tablet (500 mg total) by mouth 2 (two) times daily with a meal. 60 tablet 0    Home: Home Living Family/patient expects to be discharged to:: Private residence Living Arrangements: Spouse/significant other Available Help at Discharge: Family Type of Home: House Home Access: Stairs to enter Secretary/administrator of Steps: 3 Entrance Stairs-Rails: None Home Layout: One level Bathroom Shower/Tub: Tub/shower unit, Health visitor: Standard Home Equipment: None Additional Comments: Very supportive family. Father   Functional History: Prior Function Level of Independence: Independent Functional Status:  Mobility: Bed Mobility Overal bed mobility: Needs Assistance, +2 for physical assistance, + 2 for safety/equipment Bed Mobility: Supine to Sit, Sit to Supine Supine to sit: Max assist, +2 for physical assistance, +2 for safety/equipment, HOB elevated Sit to supine: Total assist, +2 for physical assistance General bed mobility comments: Max assist of 2 persons to come to EOB, patient able to initiate RLE movement to EOB but required maximal assist for all other aspects of mobility, Total assist of 2 persons to return to supine and reposition Transfers General transfer comment: mechanical use of Bed to position in chair position      ADL:    Cognition: Cognition Overall Cognitive Status: Difficult to assess Orientation Level: Intubated/Tracheostomy - Unable to assess Cognition Arousal/Alertness: Lethargic Behavior  During  Therapy: Flat affect Overall Cognitive Status: Difficult to assess Area of Impairment: Attention, Following commands, Awareness, Problem solving Current Attention Level: Sustained Following Commands: Follows one step commands consistently Problem Solving: Requires verbal cues, Requires tactile cues General Comments: patient was able to communicate via writing on paper expressed desire for "cool wet rag !!" difficulty with spacing and legiblity of writing  Difficult to assess due to: Impaired communication, Tracheostomy  Blood pressure 149/81, pulse 78, temperature 99.9 F (37.7 C), temperature source Rectal, resp. rate 26, height 5\' 7"  (1.702 m), weight 131 kg (288 lb 12.8 oz), SpO2 99 %. Physical Exam  Constitutional:  30 year old obese male  HENT:  Head: Normocephalic.  Nasogastric tube in place  Eyes:  Pupils sluggish to light  Neck:  Trach tube in place  Cardiovascular: Normal rate and regular rhythm.  Respiratory:  Decreased breath sounds at the bases with limited inspiratory effort  GI: Soft. Bowel sounds are normal. He exhibits no distension.  Neurological:  Patient is lethargic but will open eyes on verbal command. He will nod his head yes and no to some simple questions. He would hold his thumb up on command and squeezed my hand with right hand and wiggled toes on right. Sensed pain on left but no movement noted.  Psychiatric:  Flat/sedated     Lab Results Last 24 Hours    Results for orders placed or performed during the hospital encounter of 08/15/15 (from the past 24 hour(s))  Protime-INR Status: None   Collection Time: 08/23/15 11:00 AM  Result Value Ref Range   Prothrombin Time 15.2 11.6 - 15.2 seconds   INR 1.18 0.00 - 1.49  Glucose, capillary Status: Abnormal   Collection Time: 08/23/15 3:38 PM  Result Value Ref Range   Glucose-Capillary 128 (H) 65 - 99 mg/dL  Basic metabolic panel Status: Abnormal   Collection  Time: 08/23/15 5:30 PM  Result Value Ref Range   Sodium 153 (H) 135 - 145 mmol/L   Potassium 3.2 (L) 3.5 - 5.1 mmol/L   Chloride 120 (H) 101 - 111 mmol/L   CO2 28 22 - 32 mmol/L   Glucose, Bld 128 (H) 65 - 99 mg/dL   BUN 38 (H) 6 - 20 mg/dL   Creatinine, Ser 1.61 0.61 - 1.24 mg/dL   Calcium 9.1 8.9 - 09.6 mg/dL   GFR calc non Af Amer >60 >60 mL/min   GFR calc Af Amer >60 >60 mL/min   Anion gap 5 5 - 15  Glucose, capillary Status: Abnormal   Collection Time: 08/23/15 7:23 PM  Result Value Ref Range   Glucose-Capillary 105 (H) 65 - 99 mg/dL  Glucose, capillary Status: Abnormal   Collection Time: 08/23/15 11:28 PM  Result Value Ref Range   Glucose-Capillary 118 (H) 65 - 99 mg/dL  Glucose, capillary Status: Abnormal   Collection Time: 08/24/15 3:27 AM  Result Value Ref Range   Glucose-Capillary 150 (H) 65 - 99 mg/dL  Basic metabolic panel Status: Abnormal   Collection Time: 08/24/15 3:50 AM  Result Value Ref Range   Sodium 151 (H) 135 - 145 mmol/L   Potassium 3.3 (L) 3.5 - 5.1 mmol/L   Chloride 117 (H) 101 - 111 mmol/L   CO2 29 22 - 32 mmol/L   Glucose, Bld 151 (H) 65 - 99 mg/dL   BUN 36 (H) 6 - 20 mg/dL   Creatinine, Ser 0.45 0.61 - 1.24 mg/dL   Calcium 8.8 (L) 8.9 - 10.3 mg/dL   GFR  calc non Af Amer >60 >60 mL/min   GFR calc Af Amer >60 >60 mL/min   Anion gap 5 5 - 15  Magnesium Status: Abnormal   Collection Time: 08/24/15 3:50 AM  Result Value Ref Range   Magnesium 2.5 (H) 1.7 - 2.4 mg/dL  Phosphorus Status: None   Collection Time: 08/24/15 3:50 AM  Result Value Ref Range   Phosphorus 3.9 2.5 - 4.6 mg/dL  Glucose, capillary Status: Abnormal   Collection Time: 08/24/15 8:01 AM  Result Value Ref Range   Glucose-Capillary 136 (H) 65 - 99 mg/dL      Imaging Results (Last 48  hours)    Dg Chest Port 1 View  08/24/2015 CLINICAL DATA: Acute respiratory failure, intracranial hemorrhage EXAM: PORTABLE CHEST 1 VIEW COMPARISON: Portable chest x-ray of August 23, 2015 FINDINGS: The cardiopericardial silhouette remains enlarged. The retrocardiac density has decreased somewhat. The interstitial markings remain prominent in the right lung. The tracheostomy appliance tip lies at the level of the clavicular heads. The feeding tube tip projects below the inferior margin of the image. The right internal jugular venous catheter tip projects over the midportion of the SVC. IMPRESSION: Slight interval improvement in left lower lobe atelectasis or pneumonia. Stable cardiomegaly and stable mild interstitial edema. The support tubes are in reasonable position. Electronically Signed By: David SwazilandJordan M.D. On: 08/24/2015 08:09   Dg Chest Port 1 View  08/23/2015 CLINICAL DATA: 30 year old male status post tracheostomy placement. EXAM: PORTABLE CHEST 1 VIEW COMPARISON: Prior chest x-ray obtained earlier today at 6:09 a.m. how FINDINGS: Interval placement of a tracheostomy tube. The tube tip is midline and at the level of the clavicles. There is no evidence of pneumothorax. The gastric tube has been removed and replaced with an enteric feeding tube. The tip of the enteric feeding tube is not visualized but lies below the diaphragm likely within the stomach or small bowel. Right IJ approach central venous catheter remains in unchanged position with the tip overlying the superior cavoatrial junction. Stable cardiomegaly, pulmonary vascular congestion and bibasilar atelectasis. IMPRESSION: 1. Interval placement of a tracheostomy tube without evidence of immediate complication. 2. Stable cardiomegaly, pulmonary vascular congestion, low inspiratory volumes and bibasilar atelectasis. 3. Interval removal of gastric tube and placement of an enteric feeding tube. Electronically Signed By: Malachy MoanHeath McCullough  M.D. On: 08/23/2015 16:54   Dg Chest Port 1 View  08/23/2015 CLINICAL DATA: Acute respiratory failure acute EXAM: PORTABLE CHEST 1 VIEW COMPARISON: 08/21/2015 FINDINGS: Cardiomegaly again noted. Central mild vascular congestion without convincing pulmonary edema. Persistent left basilar atelectasis. Stable endotracheal and NG tube position. Stable right IJ central line position. IMPRESSION: Stable support apparatus. Cardiomegaly. Central mild vascular congestion without pulmonary edema. Left basilar atelectasis. Electronically Signed By: Natasha MeadLiviu Pop M.D. On: 08/23/2015 07:59   Dg Abd Portable 1v  08/23/2015 CLINICAL DATA: Feeding tube placement EXAM: PORTABLE ABDOMEN - 1 VIEW COMPARISON: 08/16/2015 FINDINGS: There is normal small bowel gas pattern. There is NG feeding tube probable within proximal duodenum. Please note the tip of the tube is not included on the film. IMPRESSION: NG tube probable within proximal duodenum. Please note the tip of the feeding tube is not included on the film. Electronically Signed By: Natasha MeadLiviu Pop M.D. On: 08/23/2015 17:01     Assessment/Plan: Diagnosis: Right BG ICH with left hemiparesis 1. Does the need for close, 24 hr/day medical supervision in concert with the patient's rehab needs make it unreasonable for this patient to be served in a less intensive setting? Yes 2.  Co-Morbidities requiring supervision/potential complications: htn, trach, dysphagia 3. Due to bladder management, bowel management, safety, skin/wound care, disease management, medication administration, pain management and patient education, does the patient require 24 hr/day rehab nursing? Yes 4. Does the patient require coordinated care of a physician, rehab nurse, PT (1-2 hrs/day, 5 days/week), OT (1-2 hrs/day, 5 days/week) and SLP (1-2 hrs/day, 5 days/week) to address physical and functional deficits in the context of the above medical diagnosis(es)? Yes Addressing deficits in the  following areas: balance, endurance, locomotion, strength, transferring, bowel/bladder control, bathing, dressing, feeding, grooming, toileting, cognition, speech, swallowing and psychosocial support 5. Can the patient actively participate in an intensive therapy program of at least 3 hrs of therapy per day at least 5 days per week? Yes and Potentially 6. The potential for patient to make measurable gains while on inpatient rehab is excellent 7. Anticipated functional outcomes upon discharge from inpatient rehab are supervision and min assist with PT, supervision and min assist with OT, modified independent and supervision with SLP. 8. Estimated rehab length of stay to reach the above functional goals is: 18-24 days 9. Does the patient have adequate social supports and living environment to accommodate these discharge functional goals? Yes and Potentially 10. Anticipated D/C setting: Home 11. Anticipated post D/C treatments: HH therapy and Outpatient therapy 12. Overall Rehab/Functional Prognosis: excellent  RECOMMENDATIONS: This patient's condition is appropriate for continued rehabilitative care in the following setting: CIR Patient has agreed to participate in recommended program. Potentially Note that insurance prior authorization may be required for reimbursement for recommended care.  Comment: Rehab Admissions Coordinator to follow up.  Thanks,  Ranelle Oyster, MD, [place] [facility]     08/24/2015       Revision History     Date/Time User Provider Type Action   08/24/2015 10:57 AM Ranelle Oyster, MD Physician Sign   08/24/2015 10:52 AM Charlton Amor, PA-C Physician Assistant [facility] Details Report

## 2015-09-01 NOTE — Progress Notes (Signed)
Occupational Therapy Treatment Patient Details Name: Brad Mcgee MRN: [id number] DOB: [birth year] Today's Date: 09/01/2015    History of present illness 30 yo male with slurred speech and Lt sided weakness. This was associated with headache. He was intubated for airway protection. He had CT head which showed 3.7 x 1.9 x 2 cm Rt basal ganglia ICH which then increased to 4.5 x 1.9 x 3.4 cm ICH with herniation of cerebellar tonsils due to mass effect vs chiari malformation on f/u CT head. S/p trach on 08/23/15 08/31/15 IVC filter for L LE DVT   OT comments  Pt initiates core and static standing this session. Pt demonstrates awareness to need for hygiene asking mother to wash bil underarms. Pt voicing with PMV to make needs know during session. Pt wanting a wheelchair to get OOB. Pt needs hoyer lift with staff for safety.    Follow Up Recommendations  CIR;Supervision/Assistance - 24 hour    Equipment Recommendations  Other (comment)    Recommendations for Other Services Rehab consult    Precautions / Restrictions Precautions Precautions: Fall Precaution Comments: watch BP, and O2 saturations on ATC       Mobility Bed Mobility Overal bed mobility: Needs Assistance Bed Mobility: Supine to Sit;Rolling Rolling: Mod assist (initiated with core this session to exit on R side) Sidelying to sit: Max assist;+2 for physical assistance       General bed mobility comments: Pt needed (A) for bil LE and cues for pushing with R UE. pt needed cues to avoid over pushing to keep midline posture  Transfers Overall transfer level: Needs assistance Equipment used:  (back of recliner in front of pt for stability in standing) Transfers: Sit to/from Stand Sit to Stand: +2 physical assistance;Max assist;From elevated surface Stand pivot transfers: Max assist;+2 physical assistance;Total assist       General transfer comment: Pt initiated sitting and powering up with R side of body. pt needs (A) to  block R knee. bracing L leg and max cue/facilitation for trunk and hip extension; used back of chair for support in standing with R UE and L UE placed there for positioning/posture, able to extend up off chair with cues and facilitation to trunk; stood twice for total of about 60 seconds including weight shifts R/L; stand pivot to chair with +2 total A due to pt fatigued from standing trials and did not move L foot to ensure closer to chair so lowered to chair and assistd to scoot back    Balance Overall balance assessment: Needs assistance Sitting-balance support: Bilateral upper extremity supported;Feet supported Sitting balance-Leahy Scale: Poor Sitting balance - Comments: sat EOB about 10 minutes working on facilitating upright posture with cervical extension, scapular retraction and anterior pelvic tilt; pt able to maintain with supervision for 10 seconds x 2; but fatigues; tends to push with R UE to L and posterior until cued to place on R knee, but also falls anteriorly to L when L arm falls off pillows supporting it.     Standing balance-Leahy Scale: Zero Standing balance comment: +2 (A0 wtih R Ue support recliner and bil LE blocked                   ADL Overall ADL's : Needs assistance/impaired Eating/Feeding: NPO   Grooming: Wash/dry face;Minimal assistance Grooming Details (indicate cue type and reason): pt able to wipe face but needs (A) for static sitting balance pt with posterior lean Upper Body Bathing: Maximal assistance Upper Body Bathing Details (  indicate cue type and reason): mother present and pt raising R UE and (A) L UE into flexion for hygiene             Toilet Transfer: +2 for physical assistance;Maximal assistance;Squat-pivot;Cueing for safety Toilet Transfer Details (indicate cue type and reason): pt sitting prematurely and needed (A) to swing hips           General ADL Comments: Pt initiated exiting the bed and facilitation to activate L side. pt  initiate trunk and hip with log rolling Pt upon sitting working on static sitting balance. pt able to initiate scapula retraction neck extension and midline static posture. Pt required 2 pillows L side to support during static sitting.       Vision                     Perception     Praxis      Cognition   Behavior During Therapy: Restless;Impulsive Overall Cognitive Status: Impaired/Different from baseline Area of Impairment: Awareness;Safety/judgement   Current Attention Level: Sustained    Following Commands: Follows one step commands with increased time Safety/Judgement: Decreased awareness of deficits Awareness: Emergent Problem Solving: Slow processing General Comments: Pt needs incr time and tactile input to initiate L side. pt asking to "walk" with decr awareness of ability to get up and walk at this time.     Extremity/Trunk Assessment               Exercises General Exercises - Lower Extremity Heel Slides: AAROM;Left;Supine;5 reps (with resisted extension to assist with LE activation)   Shoulder Instructions       General Comments      Pertinent Vitals/ Pain       Pain Assessment: No/denies pain  Home Living   Living Arrangements:  (girlfriend pta)                       Bathroom Accessibility: Yes How Accessible: Accessible via walker        Lives With: Significant other    Prior Functioning/Environment              Frequency Min 3X/week     Progress Toward Goals  OT Goals(current goals can now be found in the care plan section)  Progress towards OT goals: Progressing toward goals  Acute Rehab OT Goals Patient Stated Goal: none stated OT Goal Formulation: With patient Time For Goal Achievement: 09/07/15 Potential to Achieve Goals: Good ADL Goals Pt Will Perform Grooming: with min assist;sitting Pt Will Perform Upper Body Bathing: with mod assist;sitting Pt Will Perform Lower Body Bathing: with mod assist;sit  to/from stand Pt Will Transfer to Toilet: with max assist;bedside commode;stand pivot transfer Additional ADL Goal #1: Pt will sit EOB for 5 minutes with min guard assist as precursor for ADLs. Additional ADL Goal #2: Pts family will independently demonstrate proper positioning of UEs with min verbal cues.  Plan Discharge plan remains appropriate    Co-evaluation    PT/OT/SLP Co-Evaluation/Treatment: Yes Reason for Co-Treatment: Complexity of the patient's impairments (multi-system involvement);For patient/therapist safety PT goals addressed during session: Mobility/safety with mobility;Balance OT goals addressed during session: ADL's and self-care;Strengthening/ROM      End of Session Equipment Utilized During Treatment: Oxygen;Gait belt   Activity Tolerance Patient tolerated treatment well   Patient Left in chair;with call bell/phone within reach;with chair alarm set   Nurse Communication Mobility status;Precautions;Need for lift equipment  Time: 0930-1006 OT Time Calculation (min): 36 min  Charges: OT General Charges $OT Visit: 1 Procedure OT Treatments $Therapeutic Activity: 8-22 mins  Boone Master B 09/01/2015, 11:36 AM  Mateo Flow   OTR/L Pager: [phone] Office: [phone] .

## 2015-09-01 NOTE — PMR Pre-admission (Signed)
PMR Admission Coordinator Pre-Admission Assessment  Patient: Brad Mcgee is an 30 y.o., male MRN: [id number] DOB: 02/25/1986 Height: 5\' 7"  (170.2 cm) Weight: 126.3 kg (278 lb 7.1 oz)              Insurance Information HMO:   PPO:    PCP:     IPA:    80/20:      OTHER:  PRIMARY: [employer]      Policy#: [id number]      Subscriber: pt CM Name: Brad Mcgee.      Phone#: [phone]     Fax#: Epic access Pre-Cert#: [id number]      Employer: [employer] approved for 7 days Benefits:  Phone #: [phone]     Name: 08/24/15 Eff. Date: 03/19/15     Deduct: $1500      Out of Pocket Max: $4000      Life Max: none CIR: 80%      SNF: 80 60 days Outpatient: 80%     Co-Pay: 60 visits combined Home Health: 80%      Co-Pay: 20% DME: 80%     Co-Pay: 20% Providers: in network  SECONDARY: none        Medicaid Application Date:       Case Manager:  Disability Application Date:       Case Worker:   Emergency Contact Information Contact Information    Name Relation Home Work Mobile   Brad Mcgee [phone]     Brad Mcgee [phone]     Brad Mcgee Mother   [phone]   Brad Mcgee   [phone]     Current Medical History  Patient Admitting Diagnosis: right BG ICH with left hemiparesis  History of Present Illness: Brad Mcgee is a 30 y.o. male with history of hypertension was admitted to [facility] on 08/15/15 with acute onset of left sided weakness. CT head done revealing left basal ganglia infarct and he had decline in MS requiring intubation and transfer to [facility]. Bleed felt to be due to hypertensive emergency and he did have increase in bleed on follow up CT. CTA head and neck negative for stenosis or aneurysm. 2D echo with eF 50-55%, no wall abnormality, grade 2 diastolic dysfunction and no SOE. He was treated with hypertonic saline and required trach 06/7 due to difficulty with vent wean. He was extubated to ATC but continues to have copious secretions. He developed  fever 6/11 therefore started on Vanc/Zosyn and blood cultures done negative to date. Urine culture positive for enterobacter and antibiotics narrowed to rocephin on 06/14. BLE dopplers done due to edema and revealed LLE peroneal DVT. IVC filter placed on 06/15. He is tolerating PMSV trials and was cleared to start dyshagia 1,nectar liquids as mentation improving.   Total: 12  NIH  Past Medical History  Past Medical History  Diagnosis Date  . Hypertension   . Fracture of left ankle     treated with cast    Family History  family history includes Hypertension in his mother.  Prior Rehab/Hospitalizations:  Has the patient had major surgery during 100 days prior to admission? No  Current Medications   Current facility-administered medications:  .  0.9 %  sodium chloride infusion, , Intravenous, Continuous, Oretha Milch, MD, Last Rate: 50 mL/hr at 08/31/15 0111 .  acetaminophen (TYLENOL) solution 650 mg, 650 mg, Oral, Q4H PRN, 650 mg at 08/31/15 1219 **OR** acetaminophen (TYLENOL) suppository 650 mg, 650 mg, Rectal, Q4H PRN, Marvel Plan, MD, [phone]  mg at 08/29/15 1249 .  antiseptic oral rinse (CPC / CETYLPYRIDINIUM CHLORIDE 0.05%) solution 7 mL, 7 mL, Mouth Rinse, q12n4p, Micki RileyPramod S Sethi, MD, 7 mL at 08/30/15 1538 .  bisacodyl (DULCOLAX) suppository 10 mg, 10 mg, Rectal, Daily PRN, Coralyn HellingVineet Sood, MD .  chlorhexidine (PERIDEX) 0.12 % solution 15 mL, 15 mL, Mouth Rinse, BID, Micki RileyPramod S Sethi, MD, 15 mL at 08/31/15 1142 .  docusate (COLACE) 50 MG/5ML liquid 100 mg, 100 mg, Per Tube, BID PRN, Coralyn HellingVineet Sood, MD .  enoxaparin (LOVENOX) injection 60 mg, 60 mg, Subcutaneous, Q24H, Micki RileyPramod S Sethi, MD .  famotidine (PEPCID) 40 MG/5ML suspension 40 mg, 40 mg, Per Tube, QHS, Roslynn AmbleJennings E Nestor, MD, 40 mg at 08/31/15 2233 .  feeding supplement (PRO-STAT SUGAR FREE 64) liquid 30 mL, 30 mL, Oral, BID, Micki RileyPramod S Sethi, MD, 30 mL at 08/31/15 1143 .  fentaNYL (SUBLIMAZE) injection 25-50 mcg, 25-50 mcg, Intravenous, Q4H  PRN, Leslye Peerobert S Byrum, MD, 50 mcg at 08/25/15 2004 .  fentaNYL (SUBLIMAZE) injection, , Intravenous, PRN, Jolaine ClickArthur Hoss, MD, 25 mcg at 08/31/15 1445 .  labetalol (NORMODYNE,TRANDATE) injection 10-40 mg, 10-40 mg, Intravenous, Q10 min PRN, Micki RileyPramod S Sethi, MD, 20 mg at 08/19/15 0024 .  lisinopril (PRINIVIL,ZESTRIL) tablet 20 mg, 20 mg, Oral, BID, Marvel PlanJindong Xu, MD, 20 mg at 08/31/15 1142 .  loperamide (IMODIUM) 1 MG/5ML solution 2 mg, 2 mg, Oral, Q8H PRN, Consuella Losehere M Gregory, MD, 2 mg at 08/26/15 1035 .  metoprolol tartrate (LOPRESSOR) 25 mg/10 mL oral suspension 50 mg, 50 mg, Per Tube, BID, Marvel PlanJindong Xu, MD, 50 mg at 08/31/15 1144 .  midazolam (VERSED) injection, , Intravenous, PRN, Jolaine ClickArthur Hoss, MD, 1 mg at 08/31/15 1445 .  multivitamin liquid 15 mL, 15 mL, Per Tube, Daily, Marvel PlanJindong Xu, MD, 15 mL at 08/31/15 1143 .  piperacillin-tazobactam (ZOSYN) IVPB 3.375 g, 3.375 g, Intravenous, Q8H, Alyson ReedyWesam G Yacoub, MD, 3.375 g at 09/01/15 0557 .  RESOURCE THICKENUP CLEAR, , Oral, PRN, Micki RileyPramod S Sethi, MD .  triamterene-hydrochlorothiazide (MAXZIDE-25) 37.5-25 MG per tablet 1 tablet, 1 tablet, Oral, Daily, Micki RileyPramod S Sethi, MD, 1 tablet at 08/31/15 1142  Patients Current Diet: DIET - DYS 1 Room service appropriate?: Yes; Fluid consistency:: Nectar Thick  Precautions / Restrictions Precautions Precautions: Fall Precaution Comments: watch BP, and O2 saturations on ATC Restrictions Weight Bearing Restrictions: No   Has the patient had 2 or more falls or a fall with injury in the past year?No  Prior Activity Level Community (5-7x/wk): Independent and working. Lived with Girlfriend  Journalist, newspaperHome Assistive Devices / Equipment Home Assistive Devices/Equipment: None Home Equipment: None  Prior Device Use: Indicate devices/aids used by the patient prior to current illness, exacerbation or injury? None of the above  Prior Functional Level Prior Function Level of Independence: Independent  Self Care: Did the patient need help  bathing, dressing, using the toilet or eating?  Independent  Indoor Mobility: Did the patient need assistance with walking from room to room (with or without device)? Independent  Stairs: Did the patient need assistance with internal or external stairs (with or without device)? Independent  Functional Cognition: Did the patient need help planning regular tasks such as shopping or remembering to take medications? Independent  Current Functional Level Cognition  Overall Cognitive Status: Impaired/Different from baseline Difficult to assess due to: Tracheostomy (with PMV) Current Attention Level: Sustained Orientation Level: Oriented to person, Oriented to place, Oriented to situation Following Commands: Follows one step commands with increased time Safety/Judgement: Decreased awareness of  deficits General Comments: Pt needs incr time and tactile input to initiate L side. pt asking to "walk" with decr awareness of ability to get up and walk at this time.     Extremity Assessment (includes Sensation/Coordination)  Upper Extremity Assessment: LUE deficits/detail LUE Deficits / Details: Flaccid LUE with no active movement. PROM WFL. Reports diminished sensation LUE Sensation: decreased light touch, decreased proprioception LUE Coordination: decreased fine motor, decreased gross motor  Lower Extremity Assessment: Defer to PT evaluation LLE Deficits / Details: flaccid LLE, patient reports sensation inconsistently, question reliablity of response LLE Sensation: decreased light touch, decreased proprioception LLE Coordination: decreased fine motor, decreased gross motor    ADLs  Overall ADL's : Needs assistance/impaired Eating/Feeding: NPO Grooming: Wash/dry face, Minimal assistance Grooming Details (indicate cue type and reason): pt able to wipe face but needs (A) for static sitting balance pt with posterior lean Upper Body Bathing: Maximal assistance Upper Body Bathing Details (indicate  cue type and reason): mother present and pt raising R UE and (A) L UE into flexion for hygiene Lower Body Bathing: Total assistance Upper Body Dressing : Maximal assistance, Sitting Lower Body Dressing: Total assistance Toilet Transfer: +2 for physical assistance, Maximal assistance, Squat-pivot, Cueing for safety Toilet Transfer Details (indicate cue type and reason): pt sitting prematurely and needed (A) to swing hips General ADL Comments: Pt initiated exiting the bed and facilitation to activate L side. pt initiate trunk and hip with log rolling Pt upon sitting working on static sitting balance. pt able to initiate scapula retraction neck extension and midline static posture. Pt required 2 pillows L side to support during static sitting.     Mobility  Overal bed mobility: Needs Assistance Bed Mobility: Supine to Sit, Rolling Rolling: Mod assist (initiated with core this session to exit on R side) Sidelying to sit: Max assist, +2 for physical assistance Supine to sit: Max assist, +2 for physical assistance, +2 for safety/equipment, HOB elevated Sit to supine: Total assist, +2 for physical assistance Sit to sidelying: Max assist General bed mobility comments: Pt needed (A) for bil LE and cues for pushing with R UE. pt needed cues to avoid over pushing to keep midline posture    Transfers  Overall transfer level: Needs assistance Equipment used:  (back of recliner in front of pt for stability in standing) Transfers: Sit to/from Stand Sit to Stand: +2 physical assistance, Max assist, From elevated surface Stand pivot transfers: Max assist, +2 physical assistance, Total assist General transfer comment: Pt initiated sitting and powering up with R side of body. pt needs (A) to block R knee. bracing L leg and max cue/facilitation for trunk and hip extension; used back of chair for support in standing with R UE and L UE placed there for positioning/posture, able to extend up off chair with cues and  facilitation to trunk; stood twice for total of about 60 seconds including weight shifts R/L; stand pivot to chair with +2 total A due to pt fatigued from standing trials and did not move L foot to ensure closer to chair so lowered to chair and assistd to scoot back    Ambulation / Gait / Stairs / Wheelchair Mobility       Posture / Balance Dynamic Sitting Balance Sitting balance - Comments: sat EOB about 10 minutes working on facilitating upright posture with cervical extension, scapular retraction and anterior pelvic tilt; pt able to maintain with supervision for 10 seconds x 2; but fatigues; tends to push with R UE  to L and posterior until cued to place on R knee, but also falls anteriorly to L when L arm falls off pillows supporting it. Balance Overall balance assessment: Needs assistance Sitting-balance support: Bilateral upper extremity supported, Feet supported Sitting balance-Leahy Scale: Poor Sitting balance - Comments: sat EOB about 10 minutes working on facilitating upright posture with cervical extension, scapular retraction and anterior pelvic tilt; pt able to maintain with supervision for 10 seconds x 2; but fatigues; tends to push with R UE to L and posterior until cued to place on R knee, but also falls anteriorly to L when L arm falls off pillows supporting it. Postural control: Posterior lean Standing balance-Leahy Scale: Zero Standing balance comment: +2 (A0 wtih R Ue support recliner and bil LE blocked    Special needs/care consideration BiPAP/CPAP PCCM states pt will need CPAP if decannulated. NEW this admit Oxygen 21% trach collar Special Bed overlay Trach Size #6 cuffless Skin intact Bowel mgmt: LBM 6/10 loose Bladder mgmt: indwelling catheter with complaints of spasms and need to void Communication board needed when PMSV not in use   Previous Home Environment Living Arrangements:  (girlfriend pta)  Lives With: Significant other Available Help at Discharge:  Family Type of Home: House Home Layout: One level Home Access: Stairs to enter Entrance Stairs-Rails: None Secretary/administrator of Steps: 3 Bathroom Shower/Tub: Hydrographic surveyor, Health visitor: Standard Bathroom Accessibility: Yes How Accessible: Accessible via walker Home Care Services: No Additional Comments: Very supportive family. Mcgee reports they can make home modifications as needed.  Discharge Living Setting Plans for Discharge Living Setting: Lives with (comment) (pt plans to d/c home with his Mom and Grandmother to help) Type of Home at Discharge: House Discharge Home Layout: One level Discharge Home Access: Stairs to enter Entrance Stairs-Rails: None Entrance Stairs-Number of Steps: 2-3 steps onto front porch then one step Discharge Bathroom Shower/Tub: Tub/shower unit, Curtain Discharge Bathroom Toilet: Standard Discharge Bathroom Accessibility: Yes How Accessible: Accessible via walker Does the patient have any problems obtaining your medications?: No  Social/Family/Support Systems Patient Roles: Partner, Other (Comment) (fulltime employee) Contact Information: Mom Anticipated Caregiver: Mom, grandmother, Dad and family Anticipated Caregiver's Contact Information: see above Ability/Limitations of Caregiver: family all work but are Designer, television/film set Availability: 24/7 Discharge Plan Discussed with Primary Caregiver: Yes Is Caregiver In Agreement with Plan?: Yes Does Caregiver/Family have Issues with Lodging/Transportation while Pt is in Rehab?: No Dad and Mom rotate during the day staying with pt. They are not married. Pt plans to d/c home to Mom's home with Grandmother and multiple family members to assist.  Goals/Additional Needs Patient/Family Goal for Rehab: supervision to min assist PT and OT, Mod I to supervision with SLP Expected length of stay: ELOS 18-24 days Special Service Needs: Needs some type of communication board for  when PMSV not in use for communication with family and stafff Pt/Family Agrees to Admission and willing to participate: Yes Program Orientation Provided & Reviewed with Pt/Caregiver Including Roles  & Responsibilities: Yes  Decrease burden of Care through IP rehab admission: n/a  Possible need for SNF placement upon discharge:not anticipated  Patient Condition: This patient's medical and functional status has changed since the consult dated: 08/24/2015 in which the Rehabilitation Physician determined and documented that the patient's condition is appropriate for intensive rehabilitative care in an inpatient rehabilitation facility. See "History of Present Illness" (above) for medical update. Functional changes are: max to total assist. Patient's medical and functional status update has been discussed with  the Rehabilitation physician and patient remains appropriate for inpatient rehabilitation. Will admit to inpatient rehab today.  Preadmission Screen Completed By:  Clois Dupes, 09/01/2015 11:56 AM ______________________________________________________________________   Discussed status with Dr. Riley Kill on 09/01/2015 at 1155 and received telephone approval for admission today.  Admission Coordinator:  Clois Dupes, time 1155 Date 09/01/15.

## 2015-09-01 NOTE — Progress Notes (Signed)
PULMONARY / CRITICAL CARE MEDICINE   Name: Brad Mcgee MRN: [id number] DOB: 05-15-1985    ADMISSION DATE:  08/15/2015 CONSULTATION DATE:  08/16/2015  REFERRING MD:  Dr. Amada Jupiter  CHIEF COMPLAINT:  Left side weakness  HISTORY OF PRESENT ILLNESS:  Hx from chart. 30 yo male with slurred speech and Lt sided weakness.  This was associated with headache.  He was intubated for airway protection.  He had CT head which showed 3.7 x 1.9 x 2 cm Rt basal ganglia ICH which then increased to 4.5 x 1.9 x 3.4 cm ICH on f/u CT head.  His BP was 187/106. Trach placed 6/7  SUBJECTIVE:  Supine in bed Awake and interactive Tolerating TC #6  VITAL SIGNS: BP 121/66 mmHg  Pulse 100  Temp(Src) 98.2 F (36.8 C) (Oral)  Resp 20  Ht 5\' 7"  (1.702 m)  Wt 278 lb 7.1 oz (126.3 kg)  BMI 43.60 kg/m2  SpO2 100%  HEMODYNAMICS:    VENTILATOR SETTINGS: Vent Mode:  [-]  FiO2 (%):  [21 %-28 %] 28 %  INTAKE / OUTPUT: I/O last 3 completed shifts: In: -  Out: 2100 [Urine:2100]  PHYSICAL EXAMINATION:  General:  Obese. No distress. Neuro:  Pupils symmetric. Moves R arm spont and to command. Followscommands. L hemiplegia. Alert and interactive HEENT: Trach #6 in place. No scleral icterus or injection. Cardiovascular:  Regular rate & rhythm. No appreciable JVD given body habitus. Lungs:  Distant breath sounds. Symmetric chest rise on vent. Diminshed in bases Abdomen:  Soft. Protuberant. Normal bowel sounds. Integument:  Warm & dry. No rash on exposed skin.  LABS:  BMET  Recent Labs Lab 08/28/15 0320 08/29/15 0158 08/30/15 0556  NA 145 143 143  K 4.1 3.9 4.1  CL 112* 112* 113*  CO2 26 24 24   BUN 41* 44* 42*  CREATININE 1.02 1.17 1.12  GLUCOSE 181* 142* 136*   Electrolytes  Recent Labs Lab 08/27/15 0356 08/28/15 0320 08/29/15 0158 08/30/15 0556  CALCIUM 9.2 9.1 9.0 8.7*  MG 2.4 2.4  --   --   PHOS 4.6 4.4  --   --     CBC  Recent Labs Lab 08/28/15 0320 08/29/15 0158  08/30/15 0556  WBC 21.0* 18.2* 14.0*  HGB 12.9* 12.4* 11.3*  HCT 41.0 39.4 36.7*  PLT 292 292 264    Coag's No results for input(s): APTT, INR in the last 168 hours.  Sepsis Markers  Recent Labs Lab 08/27/15 1150 08/28/15 0320 08/29/15 0158  PROCALCITON <0.10 <0.10 <0.10    ABG No results for input(s): PHART, PCO2ART, PO2ART in the last 168 hours.  Liver Enzymes No results for input(s): AST, ALT, ALKPHOS, BILITOT, ALBUMIN in the last 168 hours.  Cardiac Enzymes No results for input(s): TROPONINI, PROBNP in the last 168 hours.  Glucose  Recent Labs Lab 08/30/15 2234 08/31/15 0629 08/31/15 1122 08/31/15 1627 08/31/15 2128 09/01/15 0614  GLUCAP 111* 116* 104* 92 81 101*    Imaging Ir Ivc Filter Plmt / S&i /img Guid/mod Sed  08/31/2015  INDICATION: DVT.  Intracranial hemorrhage. EXAM: IVC FILTER,INFERIOR VENA CAVOGRAM MEDICATIONS: None. ANESTHESIA/SEDATION: Fentanyl 25 mcg IV; Versed 1 mg IV Moderate Sedation Time:  22 minutes The patient was continuously monitored during the procedure by the interventional radiology nurse under my direct supervision. FLUOROSCOPY TIME:  Fluoroscopy Time: 3 minutes 12 seconds (319 mGy). COMPLICATIONS: None immediate. PROCEDURE: Informed written consent was obtained from the patient after a thorough discussion of the procedural risks, benefits and  alternatives. All questions were addressed. Maximal Sterile Barrier Technique was utilized including caps, mask, sterile gowns, sterile gloves, sterile drape, hand hygiene and skin antiseptic. A timeout was performed prior to the initiation of the procedure. The right neck was prepped with Betadine in a sterile fashion, and a sterile drape was applied covering the operative field. A sterile gown and sterile gloves were used for the procedure. The right jugular vein was noted to be patent initially with ultrasound. Under sonographic guidance, a micropuncture needle was inserted into the right jugular  vein (Ultrasound image documentation was performed). It was removed over an 018 wire which was up-sized to a [place]. The sheath was inserted over the wire and into the IVC. IVC venography was performed. The temporary filter was then deployed in the infrarenal IVC. The sheath was removed and hemostasis was achieved with direct pressure. FINDINGS: IVC and bilateral renal venography demonstrates renal vein inflow at L1 and no venous anomaly. Final image demonstrates an IVC filter in place with its tip at the lower L1 endplate. IMPRESSION: Successful infrarenal IVC filter placement. This is a temporary filter. It can be removed or remain in place to become permanent. Electronically Signed   By: Jolaine Click M.D.   On: 08/31/2015 15:50    STUDIES:  5/30 CT Head: 3.7 x 1.9 x 2 cm Rt basal ganglia ICH 5/31 CT Head:  No significant change. 5.2x1.9x3.0cm right basal ganglia hematoma. Mild mass effect. 5/31 TTE: Severe concentric LVH with EF 50-55%. No regional wall motion abnormalities. Grade 2 diastolic dysfunction. LA & RA normal in size. RV normal in size and function. No aortic stenosis or regurgitation. Aortic root normal in size. Trivial mitral regurgitation without stenosis. No pulmonic regurgitation or stenosis. No tricuspid regurgitation. No pericardial effusion.   MICROBIOLOGY: MRSA PCR 5/31:  Negative  Sputum 6/2 GPC>>MSSA Blood 6/2>> neg  6/11 Blood Cx >>>NTD 6/12 Urine cx >>> 20k enterobacter 6/11 Tracheal Asp >>>NTD  ANTIBIOTICS: 6/3 vanc>>6/4 6/3 zoysn>> 6/4 6/4 cefazolin >> 6/7  6/12 zosyn >6/14 6/12 Vancomycin >6/13 Rocephin 6/14>>>6/18  SIGNIFICANT EVENTS: 5/30 Admit 5/31 Start 3% NS>> 6/7   LINES/TUBES: OETT 7.5 5/31 >> 6/7 R IJ CVL 5/31 >> OGT 5/31>>>6/7 Trach 6/7 (JY)>>> FOLEY 5/31 >> PIV x3   ASSESSMENT / PLAN:  PULMONARY A: VDRF s/p trach now on ATC with copious secretions Compromised Airway - Secondary to ICH.  Acute on chronic Diastolic CHF & 3%  NaCl High risk for OSA 6/14 Changed trach to cuffless 6   P:   Can change trach collar to humidity and discontinue oxygen Downsize next week Dysphagia 1 diet, continue speech valve Would need CPAP if / when decannulated   CARDIOVASCULAR A:  Hypertensive Emergency Acute on Chronic Diastolic CHF - Likely secondary to 3% HS. Elevated Troponin I - Likely demand ischemia. No wall motion abnormalities on TTE. Grade 2 Diastolic CHF   P:  Lisinopril  QD, metoprolol 25 bid, Maxide-37.5 / 25 QD Holding diuresis   HEMATOLOGIC  A:   Anemia - Mild. Leukocytosis - Mild LLE peroneal DVT s/p IVC filter  P:  Trending cell counts w/CBC Dc SCD from LLE, consider IVC filter removal once able to tolerate full anticoagulation On enoxaparin for DVT prophylaxis given stability of Head Ct  INFECTIOUS A:   FUO - possibly secondary to ICH.  MSSA bronchitis vs PNA GNR in urine. P:   Recultured 6/11  Urine with enterobacter, change abx to rocephin with stop date.  Transfer to CIR  planned - pl reconsult in 1-2 weeks when ambulatory & we can consider decannulation, will have to ensure he tolerates CPAP prior  Cyril Mourningakesh Brileigh Sevcik MD. FCCP. [facility] Pager [phone] If no response call [phone]   09/01/2015

## 2015-09-01 NOTE — Progress Notes (Signed)
I await updated PT and OT treatments this morning so I can pursue a possible admission to inpt rehab today [facility]. I have notified therapists of need. I will follow up this morning. [phone]

## 2015-09-01 NOTE — Progress Notes (Signed)
I have alerted [facility] that updated PT and OT treatment notes are now available in EPIC. I am hopeful to admit pt today pending insurance approval. I have updated Pt's Mom at bedside as pt is busy with Rehab PA at this time. I will follow up today. [phone]

## 2015-09-01 NOTE — Interval H&P Note (Signed)
Brad Mcgee was admitted today to Inpatient Rehabilitation with the diagnosis of right basal ganglia hemorrhage.  The patient's history has been reviewed, patient examined, and there is no change in status.  Patient continues to be appropriate for intensive inpatient rehabilitation.  I have reviewed the patient's chart and labs.  Questions were answered to the patient's satisfaction. The PAPE has been reviewed and assessment remains appropriate.  SWARTZ,ZACHARY T 09/01/2015, 6:29 PM

## 2015-09-01 NOTE — H&P (View-Only) (Signed)
Physical Medicine and Rehabilitation Admission H&P    Chief Complaint  Patient presents with  . Left sided weakness with numbness, dysphagia, cognitive deficits.     HPI: Brad Mcgee is a 30 y.o. male with history of hypertension was admitted to [facility] on 08/15/15 with acute onset of left sided weakness. CT head done revealing right basal ganglia infarct and he had decline in MS requiring intubation and transfer to [facility].  Bleed felt to be due to hypertensive emergency and he did have increase in bleed on follow up CT. CTA head and neck negative for stenosis or aneurysm.  2D echo with eF 50-55%, no wall abnormality, grade 2 diastolic dysfunction and no SOE. He was treated with hypertonic saline and required trach 06/7 due to difficulty with vent wean.  He was extubated to ATC but continues to have copious secretions. He developed fever 6/11 therefore started on Vanc/Zosyn and blood cultures done negative to date. Urine culture positive for enterobacter and PCCM recommends narrowing antibiotics to rocephin.   BLE dopplers done due to edema and revealed LLE peroneal DVT. IVC filter placed on 06/15. He is tolerating PMSV trials and was cleared to start dyshagia 1,nectar liquids as mentation improving.    Review of Systems  Constitutional: Positive for malaise/fatigue.  HENT: Negative for hearing loss.   Eyes: Negative for blurred vision and double vision.  Respiratory: Positive for cough. Negative for shortness of breath and wheezing.   Cardiovascular: Negative for chest pain and palpitations.  Gastrointestinal: Negative for heartburn, nausea and abdominal pain.  Genitourinary: Positive for dysuria.       Difficulty urinating  Musculoskeletal: Positive for myalgias. Negative for back pain and neck pain.       Sore bottom due to sitting  Skin: Negative for itching and rash.  Neurological: Positive for sensory change, speech change and focal weakness. Negative for dizziness and headaches.    Psychiatric/Behavioral: The patient is nervous/anxious.     Past Medical History  Diagnosis Date  . Hypertension   . Fracture of left ankle     treated with cast    History reviewed. No pertinent past surgical history.    Family History  Problem Relation Age of Onset  . Hypertension Mother     Social History:  Lives alone. Independent and working as Designer, industrial/product at [employer]. He  reports that he smoked briefly in HS. He has never used smokeless tobacco. He reports that he does not drink alcohol or use illicit drugs.     Allergies  Allergen Reactions  . Bee Venom Anaphylaxis  . Coconut Oil Anaphylaxis  . Ancef [Cefazolin] Rash     Medications Prior to Admission  Medication Sig Dispense Refill  . lisinopril (PRINIVIL,ZESTRIL) 10 MG tablet Take 10 mg by mouth daily.    . naproxen (NAPROSYN) 500 MG tablet Take 1 tablet (500 mg total) by mouth 2 (two) times daily with a meal. 60 tablet 0    Home: Home Living Family/patient expects to be discharged to:: Private residence Living Arrangements:  (girlfriend pta) Available Help at Discharge: Family Type of Home: House Home Access: Stairs to enter Secretary/administrator of Steps: 3 Entrance Stairs-Rails: None Home Layout: One level Bathroom Shower/Tub: Tub/shower unit, Health visitor: Standard Bathroom Accessibility: Yes Home Equipment: None Additional Comments: Very supportive family. Father reports they can make home modifications as needed.  Lives With: Significant other   Functional History: Prior Function Level of Independence: Independent  Functional Status:  Mobility: Bed Mobility Overal bed mobility: Needs Assistance Bed Mobility: Rolling, Sidelying to Sit Rolling: Mod assist, Max assist Sidelying to sit: Max assist, +2 for safety/equipment Supine to sit: Max assist, +2 for physical assistance, +2 for safety/equipment, HOB elevated Sit to supine: Total assist, +2 for physical assistance Sit  to sidelying: Max assist General bed mobility comments: assist, and increaed time to initiate with L shoulder and L LE for rolling with HOB elevated, assist for legs off bed and cue to use L elbow in sidelying to push up  Transfers Overall transfer level: Needs assistance Equipment used:  (back of recliner in front of pt for stability in standing) Transfers: Sit to/from Stand, Stand Pivot Transfers Sit to Stand: +2 physical assistance, Max assist, From elevated surface Stand pivot transfers: Max assist, +2 physical assistance, Total assist (+ another to hold chair) General transfer comment: pt able to initiate sit to stand, but impulsively prior to proper set up; needed support for L arm and bracing L leg and max cue/facilitation for trunk and hip extension; used back of chair for support in standing with R UE and L UE placed there for positioning/posture, able to extend up off chair with cues and facilitation to trunk; stood twice for total of about 60 seconds including weight shifts R/L; stand pivot to chair with +2 total A due to pt fatigued from standing trials and did not move L foot to ensure closer to chair so lowered to chair and assistd to scoot back      ADL: ADL Overall ADL's : Needs assistance/impaired Eating/Feeding: NPO Grooming: Wash/dry face, Moderate assistance, Sitting Grooming Details (indicate cue type and reason): Pt able to swipe at mouth x 2 then wipe forehead x 2 with washcloth using R hand. Max verbal cues provided for initiation of grooming tasks.  Upper Body Bathing: Maximal assistance, Sitting Lower Body Bathing: Total assistance Upper Body Dressing : Maximal assistance, Sitting Lower Body Dressing: Total assistance General ADL Comments: Pt able to write sentences to communicate   Cognition: Cognition Overall Cognitive Status: Impaired/Different from baseline Orientation Level: Oriented to person, Oriented to place, Oriented to  situation Cognition Arousal/Alertness: Awake/alert Behavior During Therapy: WFL for tasks assessed/performed Overall Cognitive Status: Impaired/Different from baseline Area of Impairment: Attention, Safety/judgement, Awareness, Problem solving Current Attention Level: Sustained Following Commands: Follows multi-step commands with increased time Safety/Judgement: Decreased awareness of safety, Decreased awareness of deficits Awareness: Emergent Problem Solving: Difficulty sequencing, Requires verbal cues, Requires tactile cues General Comments: patient was able to communicate via writing on paper expressed desire for "cool wet rag !!" difficulty with spacing and legiblity of writing  Difficult to assess due to: Impaired communication, Tracheostomy   Blood pressure 121/66, pulse 100, temperature 98.2 F (36.8 C), temperature source Oral, resp. rate 20, height  (1.702 m), weight 126.3 kg (278 lb 7.1 oz), SpO2 100 %. Physical Exam  Nursing note and vitals reviewed. Constitutional: He is oriented to person, place, and time. He appears well-developed and well-nourished. No distress.  Obese   HENT:  Head: Normocephalic and atraumatic.  Mouth/Throat: Oropharynx is clear and moist.  White coating on tongue  Eyes: Conjunctivae are normal. Pupils are equal, round, and reactive to light.  Neck: No thyromegaly present.  #6 Cuffless trach in place with ATC.   Cardiovascular: Regular rhythm.  Tachycardia present.   Respiratory: Effort normal. No accessory muscle usage or stridor. No respiratory distress. He has rhonchi in the right upper field and the left upper field.  GI: Soft. Bowel sounds are normal. He exhibits no distension. There is no tenderness.  Musculoskeletal: He exhibits no edema or tenderness.  Left foot and shin cool to touch.   Neurological: He is alert and oriented to person, place, and time.  Left inattention and has bouts of lability/impulsivity.  Lacks insight into  deficits. Delayed processing in part due to anxiety but improved with encouragement.  Dysphonic speech with PMSV--needs occasional cues to clear secretions. Senses pinch and gross touch LUE and LLE. MMT: LUE- tr-1/5 delt, bicep, tr/5 tricep, wrist, hand. LLE- tr-1 HF,KE and tr/5 ADF/PF. RUE and RLE move spontaneously.  Skin: Skin is warm and dry. He is not diaphoretic.  Psychiatric: His mood appears anxious. His speech is slurred. He is slowed. He expresses inappropriate judgment.    Results for orders placed or performed during the hospital encounter of 08/15/15 (from the past 48 hour(s))  Glucose, capillary     Status: Abnormal   Collection Time: 08/30/15  4:30 PM  Result Value Ref Range   Glucose-Capillary 128 (H) 65 - 99 mg/dL  Glucose, capillary     Status: Abnormal   Collection Time: 08/30/15 10:34 PM  Result Value Ref Range   Glucose-Capillary 111 (H) 65 - 99 mg/dL   Comment 1 Notify RN    Comment 2 Document in Chart   Glucose, capillary     Status: Abnormal   Collection Time: 08/31/15  6:29 AM  Result Value Ref Range   Glucose-Capillary 116 (H) 65 - 99 mg/dL   Comment 1 Notify RN    Comment 2 Document in Chart   Glucose, capillary     Status: Abnormal   Collection Time: 08/31/15 11:22 AM  Result Value Ref Range   Glucose-Capillary 104 (H) 65 - 99 mg/dL  Glucose, capillary     Status: None   Collection Time: 08/31/15  4:27 PM  Result Value Ref Range   Glucose-Capillary 92 65 - 99 mg/dL  Glucose, capillary     Status: None   Collection Time: 08/31/15  9:28 PM  Result Value Ref Range   Glucose-Capillary 81 65 - 99 mg/dL   Comment 1 Notify RN    Comment 2 Document in Chart   Glucose, capillary     Status: Abnormal   Collection Time: 09/01/15  6:14 AM  Result Value Ref Range   Glucose-Capillary 101 (H) 65 - 99 mg/dL   Comment 1 Notify RN    Comment 2 Document in Chart    Ir Ivc Filter Plmt / S&i /img Guid/mod Sed  08/31/2015  INDICATION: DVT.  Intracranial hemorrhage.  EXAM: IVC FILTER,INFERIOR VENA CAVOGRAM MEDICATIONS: None. ANESTHESIA/SEDATION: Fentanyl 25 mcg IV; Versed 1 mg IV Moderate Sedation Time:  22 minutes The patient was continuously monitored during the procedure by the interventional radiology nurse under my direct supervision. FLUOROSCOPY TIME:  Fluoroscopy Time: 3 minutes 12 seconds (319 mGy). COMPLICATIONS: None immediate. PROCEDURE: Informed written consent was obtained from the patient after a thorough discussion of the procedural risks, benefits and alternatives. All questions were addressed. Maximal Sterile Barrier Technique was utilized including caps, mask, sterile gowns, sterile gloves, sterile drape, hand hygiene and skin antiseptic. A timeout was performed prior to the initiation of the procedure. The right neck was prepped with Betadine in a sterile fashion, and a sterile drape was applied covering the operative field. A sterile gown and sterile gloves were used for the procedure. The right jugular vein was noted to be patent initially with ultrasound. Under  sonographic guidance, a micropuncture needle was inserted into the right jugular vein (Ultrasound image documentation was performed). It was removed over an 018 wire which was up-sized to a [place]. The sheath was inserted over the wire and into the IVC. IVC venography was performed. The temporary filter was then deployed in the infrarenal IVC. The sheath was removed and hemostasis was achieved with direct pressure. FINDINGS: IVC and bilateral renal venography demonstrates renal vein inflow at L1 and no venous anomaly. Final image demonstrates an IVC filter in place with its tip at the lower L1 endplate. IMPRESSION: Successful infrarenal IVC filter placement. This is a temporary filter. It can be removed or remain in place to become permanent. Electronically Signed   By: Jolaine Click M.D.   On: 08/31/2015 15:50       Medical Problem List and Plan: 1.  Left hemiparesis, cognitive and functional  deficits  secondary to right basal ganglia hemorrhage 2.  Left peroneal DVT/Anticoagulation: IVC filter placed on 06/16 3. Pain Management: tylenol prn.  4. Mood: LCSW to follow for evaluation and support.   -spoke to mother who senses a lot of coping issues due to loss of a control and typically being the person others relied on PTA 5. Neuropsych: This patient is not fully capable of making decisions on his own behalf. 6. Skin/Wound Care:  Routine pressure relief measures.  7. Fluids/Electrolytes/Nutrition: Monitor intake on dysphagia 1, nectar liquids.  8. VDRF s/p trach:  Changed to CFS # 6 on 06/14--high risk for OSA. Will need CPAP if decannulated per CCM.  9. Acute on chronic CHF:  Likely due to hypertonic saline. Check daily weights and montor I/O.  10. Malignant HTN: Monitor BP qid--continue  11. Leukocytosis due to MSSA bronchitis v/s PNA and Enterobacter UTI:  Resolving with treatment of UTI.  Monitor temps and for other signs of infection. To continue rocephin thorough 6/18 12.  ABLA: Stable. Will check follow up CBC in am.  13.  Enterobacter UTI: ON rocephin thorough 06/18. Discontinue foley--question bladder spasms. Will add urispas to help with symptoms.    Post Admission Physician Evaluation: 1. Functional deficits secondary  to right basal ganglia hemorrhage. 2. Patient is admitted to receive collaborative, interdisciplinary care between the physiatrist, rehab nursing staff, and therapy team. 3. Patient's level of medical complexity and substantial therapy needs in context of that medical necessity cannot be provided at a lesser intensity of care such as a SNF. 4. Patient has experienced substantial functional loss from his/her baseline which was documented above under the "Functional History" and "Functional Status" headings.  Judging by the patient's diagnosis, physical exam, and functional history, the patient has potential for functional progress which will result in  measurable gains while on inpatient rehab.  These gains will be of substantial and practical use upon discharge  in facilitating mobility and self-care at the household level. 5. Physiatrist will provide 24 hour management of medical needs as well as oversight of the therapy plan/treatment and provide guidance as appropriate regarding the interaction of the two. 6. 24 hour rehab nursing will assist with bladder management, bowel management, safety, skin/wound care, disease management, medication administration, pain management and patient education  and help integrate therapy concepts, techniques,education, etc. 7. PT will assess and treat for/with: Lower extremity strength, range of motion, stamina, balance, functional mobility, safety, adaptive techniques and equipment, NMR, cognitive-behavioral rx, family education.   Goals are: min assist. 8. OT will assess and treat for/with: ADL's, functional mobility, safety, upper  extremity strength, adaptive techniques and equipment, NMR, cognitive-behavioral rx, education, ego support.   Goals are:  Min assist. Therapy may not yet proceed with showering this patient. 9. SLP will assess and treat for/with: cognition, speech, swallowing, trach mgt, education.  Goals are: supervision to min assist. 10. Case Management and Social Worker will assess and treat for psychological issues and discharge planning. 11. Team conference will be held weekly to assess progress toward goals and to determine barriers to discharge. 12. Patient will receive at least 3 hours of therapy per day at least 5 days per week. 13. ELOS: 24-28 days       14. Prognosis:  good     Ranelle OysterZachary T. Swartz, MD, [facility] [id number] [facility] 09/01/2015

## 2015-09-01 NOTE — Discharge Summary (Signed)
Stroke Discharge Summary  Patient ID: Brad Mcgee M Brand    l   MRN: [id number]      DOB: 02/03/1986  Date of Admission: 08/15/2015 Date of Discharge: 09/01/2015  Attending Physician:  Micki RileyPramod S Saleh Ulbrich, MD, Stroke MD Consulting Physician(s):   Daiva HugeVincent Sood, MD (pulmonary/intensive care), Cherrie DistanceBenjamin Ditty, MD(neurosurgery), Faith RogueZachary Swartz, MD (Physical Medicine & Rehabtilitation), Jolaine ClickArthur Hoss, MD (Radiology) Patient's PCP:  No primary care provider on file.  Discharge Diagnoses:  Principal Problem:   Right Basal ganglia hemorrhage (HCC) Active Problems:   Hypertensive Emergency   Cytotoxic brain edema (HCC)   Medically induced hypernatremia for Cerebral Edema    Brain herniation (HCC)   Acute respiratory failure (HCC)   Acute respiratory failure with hypoxemia (HCC)   MSSA bronchitis vs PNA   Tracheostomy in place (HCC)   Fever, unspecified   Deep venous thrombosis (DVT) of left peroneal vein (HCC)   Dysphagia following nontraumatic intracerebral hemorrhage   Morbid obesity (HCC), BMI  Body mass index is 43.6 kg/(m^2).    Rash - allergic   Acute on chronic diastolic CHF, grade 2 chronic CHF   Anemia, mild   Hypokalemia   Loose stools    Elevated troponins, likely demand ischemia.    Leukocytosis, mild    GNR UTI  Past Medical History  Diagnosis Date  . Hypertension   . Fracture of left ankle     treated with cast   History reviewed. No pertinent past surgical history.  Medications to be continued on Rehab . antiseptic oral rinse  7 mL Mouth Rinse q12n4p  . chlorhexidine  15 mL Mouth Rinse BID  . enoxaparin (LOVENOX) injection  60 mg Subcutaneous Q24H  . famotidine  40 mg Per Tube QHS  . feeding supplement (PRO-STAT SUGAR FREE 64)  30 mL Oral BID  . lisinopril  20 mg Oral BID  . metoprolol tartrate  50 mg Per Tube BID  . multivitamin  15 mL Per Tube Daily  . piperacillin-tazobactam (ZOSYN)  IV  3.375 g Intravenous Q8H  . triamterene-hydrochlorothiazide  1 tablet Oral Daily     LABORATORY STUDIES CBC    Component Value Date/Time   WBC 14.0* 08/30/2015 0556   RBC 3.63* 08/30/2015 0556   HGB 11.3* 08/30/2015 0556   HCT 36.7* 08/30/2015 0556   PLT 264 08/30/2015 0556   MCV 101.1* 08/30/2015 0556   MCH 31.1 08/30/2015 0556   MCHC 30.8 08/30/2015 0556   RDW 13.2 08/30/2015 0556   LYMPHSABS 2.8 08/15/2015 2245   MONOABS 0.4 08/15/2015 2245   EOSABS 0.1 08/15/2015 2245   BASOSABS 0.0 08/15/2015 2245   CMP    Component Value Date/Time   NA 143 08/30/2015 0556   K 4.1 08/30/2015 0556   CL 113* 08/30/2015 0556   CO2 24 08/30/2015 0556   GLUCOSE 136* 08/30/2015 0556   BUN 42* 08/30/2015 0556   CREATININE 1.12 08/30/2015 0556   CALCIUM 8.7* 08/30/2015 0556   PROT 7.1 08/15/2015 2245   ALBUMIN 2.5* 08/22/2015 0500   AST 23 08/15/2015 2245   ALT 20 08/15/2015 2245   ALKPHOS 58 08/15/2015 2245   BILITOT 0.4 08/15/2015 2245   GFRNONAA >60 08/30/2015 0556   GFRAA >60 08/30/2015 0556   COAGS Lab Results  Component Value Date   INR 1.18 08/23/2015   INR 0.98 08/15/2015   Lipid Panel    Component Value Date/Time   TRIG 331* 08/22/2015 0526   Urinalysis    Component Value Date/Time  COLORURINE YELLOW 08/18/2015 1435   APPEARANCEUR CLEAR 08/18/2015 1435   LABSPEC 1.017 08/18/2015 1435   PHURINE 7.0 08/18/2015 1435   GLUCOSEU NEGATIVE 08/18/2015 1435   HGBUR NEGATIVE 08/18/2015 1435   BILIRUBINUR NEGATIVE 08/18/2015 1435   KETONESUR NEGATIVE 08/18/2015 1435   PROTEINUR NEGATIVE 08/18/2015 1435   NITRITE NEGATIVE 08/18/2015 1435   LEUKOCYTESUR NEGATIVE 08/18/2015 1435   Urine Drug Screen     Component Value Date/Time   LABOPIA NONE DETECTED 08/16/2015 0033   COCAINSCRNUR NONE DETECTED 08/16/2015 0033   LABBENZ NONE DETECTED 08/16/2015 0033   AMPHETMU NONE DETECTED 08/16/2015 0033   THCU NONE DETECTED 08/16/2015 0033   LABBARB NONE DETECTED 08/16/2015 0033    Alcohol Level    Component Value Date/Time   ETH <5 08/15/2015 2245     SIGNIFICANT DIAGNOSTIC STUDIES Ct Head Wo Contrast 08/20/2015 ADDENDUM: There is herniation of the cerebellar tonsils through the foramen magnum. This is stable when compared to previous studies and likely reflects Chiari malformation although herniation from mass-effect cannot be completely excluded. 08/20/2015 Stable right basal ganglia hemorrhage with mass-effect and slight midline shift.  08/16/2015 No significant change since the previous study. Right basal ganglia hematoma volume 15.4 cc. Slightly better defined surrounding edema.  08/16/2015 Increased size of acute right basal ganglia hemorrhage, with increased mass effect. There is now 2 mm right to left midline shift. There is developing supratentorial sulcal effacement and persistent basilar cistern effacement concerning for developing cerebral edema.  08/15/2015 Acute right basal ganglia hemorrhage measuring 3.7 x 1.9 x 2.0 cm. Mild surrounding edema and mass effect, no midline shift. There is crowding of the basilar cisterns.   Ct Angio Head & Neck W/cm &/or Wo/cm 08/18/2015 1. Negative CTA of the head and neck. Please note that evaluation is somewhat limited by body habitus and timing of the contrast bolus. 2. Slight interval increase in size of acute right basal ganglia parenchymal hemorrhage, now measuring approximately 16 cc in estimated volume, previously 15.4 cc. Slightly increased and more defined surrounding vasogenic edema. Similar 2 mm right-to-left shift. Supratentorial sulcal effacement concerning for cerebral edema, similar to prior.   Dg Chest Port 1 View 08/26/2015 Low lung volumes with potential atelectasis/ consolidation at the left base. Interval removal of right IJ catheter. Unchanged tracheostomy tube and enteric feeding tube. 08/24/2015 Slight interval improvement in left lower lobe atelectasis or pneumonia. Stable cardiomegaly and stable mild interstitial edema. The support tubes are in reasonable position.   08/23/2015 Stable support apparatus. Cardiomegaly. Central mild vascular congestion without pulmonary edema. Left basilar atelectasis. 08/21/2015 Cardiomegaly with vascular congestion. Improving interstitial edema.  08/20/2015 Mild interstitial edema/CHF.  08/17/2015 1. Lines and tubes in stable position . 2. Cardiomegaly with increased interstitial markings consistent with congestive heart failure. Small left pleural effusion cannot be excluded.  08/16/2015 1. Tip of the right central line in the mid SVC. No pneumothorax. 2. Endotracheal and enteric tubes remain in place. 3. Unchanged enlargement of the cardiac silhouette. Decreased vascular congestion and edema from prior.  08/16/2015 1. Endotracheal tube seen ending 2-3 cm above the carina. 2. Lungs mildly hypoexpanded. Vascular congestion noted. Increased interstitial markings raise concern for mild pulmonary edema, though pneumonia could have a similar appearance.   Dg Abd Portable 1v 08/16/2015 No acute findings per Enteric tube with tip in the right upper quadrant likely over the distal stomach or proximal duodenum.   2D Echocardiogram  Left ventricle: The cavity size was normal. There was severe concentric hypertrophy. Systolic function was  normal. The estimated ejection fraction was in the range of 50% to 55%. Wall motion was normal; there were no regional wall motion abnormalities. Features are consistent with a pseudonormal left ventricular filling pattern, with concomitant abnormal relaxation and increased filling pressure (grade 2 diastolic dysfunction). Doppler parameters are consistent with high ventricular filling pressure.     HISTORY OF PRESENT ILLNESS GRAHAM HYUN is a 30 y.o. male with a history of hypertension who presents with ICH to [facility] hospital. He was apparently normal at 9:30 pm 08/15/2015, then developed witnessed onset left sided weakness at that time. He was taken [facility] where he was found to have a  basal ganglia ICH. Following the initial CT scan, he had subsequent worsening with depressed mental status. Due to this, he required intubation. He is a history of long standing hypertension. Intracerebral Hemorrhage (ICH) ScoreTotal: 1. Patient was not administered IV t-PA secondary to ICH. He was transferred to [facility] and admitted to the neuro ICU for further evaluation and treatment.he was intubated and sedated     HOSPITAL COURSE Mr. CHANSE KAGEL is a 30 y.o. male with history of hypertension presenting with L sided weakness. CT showed a L basal ganglia hemorrhage, with change in mental status, pt had repeat Ct head which showed mildly increased size of hmg, and increased mass effect. Pt and was subsequently intubated with difficulty weaning, s/p trach. Developed LLE peroneal DVT s/p IVC filter placement given DVT risk in ICH patient. Transferred to CIR for ongoing PT, OT and ST.  Stroke: Right BG ICH hemorrhage secondary to hypertension   Resultant L hemiplegia, VDRF  Initial CT R BG hemorrhage 3.7x1.9x2, mild edema & mass effect  Repeat CT with neuro worsening increased size of R BG hmg w/ increased mass effect and cerebral edema.   Repeat CT same. Stable hematoma. Unchanged shift or edema.  CTA head & neck unremarkable  2D Echo EF 50-55%. No source of embolus   SCDs and lovenox for VTE prophylaxis  Diet NPO time specified  Diet NPO time specified Except for: Sips with Meds, on tube feeds.  No antithrombotic prior to admission, not on antithrombotics due to ICH  Ongoing aggressive stroke risk factor management  Therapy recommendations: CIR. Admissions coordinator following  Disposition: CIR  LLE peroneal DVT s/p filter  L leg edema, improving  LE doppler positive peroneal clot LLE  Given DVT risk in this hemorrhagic stroke patient, IVC filter was placed 08/31/2015 by Dr. Bonnielee Haff in radiology  Acute respiratory failure secondary to ICH MSSA bronchitis vs PNA S/p  Trach placement  Intubated in the ED  CCM consulted  Treated with zosyn, narrowed to ancef ( which was discontinued as below). Completed Vanc. Back on zosyn as of yesterday by Dr. Molli Knock.   Trach placement by CCM 08/23/2015  For cufless Trach today  Dysphagia  secondary to stroke  Treated with tube feedings. Progressed to oral diet DIET - DYS 1 Room service appropriate?: Yes; Fluid consistency:: Nectar Thick   Medically induced hypernatremia for Cerebral Edema control  Treated with 3%   NA normalized  Rash - allergic  During last week, previous discussion with inpt pharmacy and ancef was considered as most likely culprit, metoprolol less likely. Pt was on lisinopril at home  Thus Ancef discontinued after 6 days treatment  Hypertensive Emergency  BP 182/124 on arrival in setting of acute neurologic symptoms  BP goal < 160   Treated with cardene  metoprolol, lisinopril, triamterene-HCTZ 37.2/25  BP normalized  Other Stroke Risk Factors  Morbid Obesity, Body mass index is 42.36 kg/(m^2).   Acute on chronic diastolic CHF, grade 2 chronic CHF  High risk OSA. OP eval recommended  Other Active Problems  Anemia, mild  Hypokalemia, improved today  Loose stools that are c.dif negative. Improving with Imodium  Elevated troponins, likely demand ischemia. No wall motion abnormalities on TTE  Leukocytosis, mild  Fever unknown origin, likely due to ICH  GNR in urine, recultured 6/11, treated with rocephin course   DISCHARGE EXAM Blood pressure 121/66, pulse 90, temperature 98.2 F (36.8 C), temperature source Oral, resp. rate 20, height  (1.702 m), weight 126.3 kg (278 lb 7.1 oz), SpO2 100 %. GENERAL- trach in place, on trach collar, off sedation, appears stated age, not in acute distress, Obese. HEENT- Atraumatic, normocephalic, Pupils reactive and equal CARDIAC- regular rate and rhythm, no murmurs, rubs or gallops PULM: Decreased breath sounds  at bases ABD: Obese, soft NT EXTREM: No C/C/E  NEUROLOGICAL EXAM : trach in place, on trach collar, off sedation, eye open, able to follow commands on the right side. Nods yes/no. Eyes open able to communicate using right hand  Pupils 2 mm reactive. Fundi not visualized. Eyes mid position, slightly dysconjugate gaze with mild exotropia left eye and difficulty with crossing the midline on the left. Eye closure intact but stronger on the right, left facial droop, tongue in mid position  LLE and LUE 0/5 and no spontaneous movement, RUE and RLE 4/5   Sensation: Patient indicates no difference  Coordination and gait not tested.   Discharge Diet  DIET - DYS 1 Room service appropriate?: Yes; Fluid consistency:: Nectar Thick liquids  DISCHARGE PLAN  Disposition:  Transfer to [facility] for ongoing PT, OT and ST  Recommend ongoing risk factor control by Primary Care Physician at time of discharge from inpatient rehabilitation.  Follow-up primary care provider in 2 weeks following discharge from rehab. Patient instructed to get a PCP if he does not have one.  Follow-up with Dr. Delia Heady, Stroke Clinic in 2 months, office to schedule an appointment.   60 minutes were spent preparing discharge.  Rhoderick Moody [facility] See Amion for Pager information 09/01/2015 2:18 PM  I have personally examined this patient, reviewed notes, independently viewed imaging studies, participated in medical decision making and plan of care. I have made any additions or clarifications directly to the above note. Agree with note above.   Delia Heady, MD Medical Director [facility] Pager: [phone] 09/01/2015 3:17 PM

## 2015-09-01 NOTE — H&P (Signed)
Physical Medicine and Rehabilitation Admission H&P    Chief Complaint  Patient presents with  . Left sided weakness with numbness, dysphagia, cognitive deficits.     HPI: Brad Mcgee is a 30 y.o. male with history of hypertension was admitted to [facility] on 08/15/15 with acute onset of left sided weakness. CT head done revealing right basal ganglia infarct and he had decline in MS requiring intubation and transfer to [facility].  Bleed felt to be due to hypertensive emergency and he did have increase in bleed on follow up CT. CTA head and neck negative for stenosis or aneurysm.  2D echo with eF 50-55%, no wall abnormality, grade 2 diastolic dysfunction and no SOE. He was treated with hypertonic saline and required trach 06/7 due to difficulty with vent wean.  He was extubated to ATC but continues to have copious secretions. He developed fever 6/11 therefore started on Vanc/Zosyn and blood cultures done negative to date. Urine culture positive for enterobacter and PCCM recommends narrowing antibiotics to rocephin.   BLE dopplers done due to edema and revealed LLE peroneal DVT. IVC filter placed on 06/15. He is tolerating PMSV trials and was cleared to start dyshagia 1,nectar liquids as mentation improving.    Review of Systems  Constitutional: Positive for malaise/fatigue.  HENT: Negative for hearing loss.   Eyes: Negative for blurred vision and double vision.  Respiratory: Positive for cough. Negative for shortness of breath and wheezing.   Cardiovascular: Negative for chest pain and palpitations.  Gastrointestinal: Negative for heartburn, nausea and abdominal pain.  Genitourinary: Positive for dysuria.       Difficulty urinating  Musculoskeletal: Positive for myalgias. Negative for back pain and neck pain.       Sore bottom due to sitting  Skin: Negative for itching and rash.  Neurological: Positive for sensory change, speech change and focal weakness. Negative for dizziness and headaches.    Psychiatric/Behavioral: The patient is nervous/anxious.     Past Medical History  Diagnosis Date  . Hypertension   . Fracture of left ankle     treated with cast    History reviewed. No pertinent past surgical history.    Family History  Problem Relation Age of Onset  . Hypertension Mother     Social History:  Lives alone. Independent and working as Designer, industrial/product at [employer]. He  reports that he smoked briefly in HS. He has never used smokeless tobacco. He reports that he does not drink alcohol or use illicit drugs.     Allergies  Allergen Reactions  . Bee Venom Anaphylaxis  . Coconut Oil Anaphylaxis  . Ancef [Cefazolin] Rash     Medications Prior to Admission  Medication Sig Dispense Refill  . lisinopril (PRINIVIL,ZESTRIL) 10 MG tablet Take 10 mg by mouth daily.    . naproxen (NAPROSYN) 500 MG tablet Take 1 tablet (500 mg total) by mouth 2 (two) times daily with a meal. 60 tablet 0    Home: Home Living Family/patient expects to be discharged to:: Private residence Living Arrangements:  (girlfriend pta) Available Help at Discharge: Family Type of Home: House Home Access: Stairs to enter Secretary/administrator of Steps: 3 Entrance Stairs-Rails: None Home Layout: One level Bathroom Shower/Tub: Tub/shower unit, Health visitor: Standard Bathroom Accessibility: Yes Home Equipment: None Additional Comments: Very supportive family. Father reports they can make home modifications as needed.  Lives With: Significant other   Functional History: Prior Function Level of Independence: Independent  Functional Status:  Mobility: Bed Mobility Overal bed mobility: Needs Assistance Bed Mobility: Rolling, Sidelying to Sit Rolling: Mod assist, Max assist Sidelying to sit: Max assist, +2 for safety/equipment Supine to sit: Max assist, +2 for physical assistance, +2 for safety/equipment, HOB elevated Sit to supine: Total assist, +2 for physical assistance Sit  to sidelying: Max assist General bed mobility comments: assist, and increaed time to initiate with L shoulder and L LE for rolling with HOB elevated, assist for legs off bed and cue to use L elbow in sidelying to push up  Transfers Overall transfer level: Needs assistance Equipment used:  (back of recliner in front of pt for stability in standing) Transfers: Sit to/from Stand, Stand Pivot Transfers Sit to Stand: +2 physical assistance, Max assist, From elevated surface Stand pivot transfers: Max assist, +2 physical assistance, Total assist (+ another to hold chair) General transfer comment: pt able to initiate sit to stand, but impulsively prior to proper set up; needed support for L arm and bracing L leg and max cue/facilitation for trunk and hip extension; used back of chair for support in standing with R UE and L UE placed there for positioning/posture, able to extend up off chair with cues and facilitation to trunk; stood twice for total of about 60 seconds including weight shifts R/L; stand pivot to chair with +2 total A due to pt fatigued from standing trials and did not move L foot to ensure closer to chair so lowered to chair and assistd to scoot back      ADL: ADL Overall ADL's : Needs assistance/impaired Eating/Feeding: NPO Grooming: Wash/dry face, Moderate assistance, Sitting Grooming Details (indicate cue type and reason): Pt able to swipe at mouth x 2 then wipe forehead x 2 with washcloth using R hand. Max verbal cues provided for initiation of grooming tasks.  Upper Body Bathing: Maximal assistance, Sitting Lower Body Bathing: Total assistance Upper Body Dressing : Maximal assistance, Sitting Lower Body Dressing: Total assistance General ADL Comments: Pt able to write sentences to communicate   Cognition: Cognition Overall Cognitive Status: Impaired/Different from baseline Orientation Level: Oriented to person, Oriented to place, Oriented to  situation Cognition Arousal/Alertness: Awake/alert Behavior During Therapy: WFL for tasks assessed/performed Overall Cognitive Status: Impaired/Different from baseline Area of Impairment: Attention, Safety/judgement, Awareness, Problem solving Current Attention Level: Sustained Following Commands: Follows multi-step commands with increased time Safety/Judgement: Decreased awareness of safety, Decreased awareness of deficits Awareness: Emergent Problem Solving: Difficulty sequencing, Requires verbal cues, Requires tactile cues General Comments: patient was able to communicate via writing on paper expressed desire for "cool wet rag !!" difficulty with spacing and legiblity of writing  Difficult to assess due to: Impaired communication, Tracheostomy   Blood pressure 121/66, pulse 100, temperature 98.2 F (36.8 C), temperature source Oral, resp. rate 20, height  (1.702 m), weight 126.3 kg (278 lb 7.1 oz), SpO2 100 %. Physical Exam  Nursing note and vitals reviewed. Constitutional: He is oriented to person, place, and time. He appears well-developed and well-nourished. No distress.  Obese   HENT:  Head: Normocephalic and atraumatic.  Mouth/Throat: Oropharynx is clear and moist.  White coating on tongue  Eyes: Conjunctivae are normal. Pupils are equal, round, and reactive to light.  Neck: No thyromegaly present.  #6 Cuffless trach in place with ATC.   Cardiovascular: Regular rhythm.  Tachycardia present.   Respiratory: Effort normal. No accessory muscle usage or stridor. No respiratory distress. He has rhonchi in the right upper field and the left upper field.  GI: Soft. Bowel sounds are normal. He exhibits no distension. There is no tenderness.  Musculoskeletal: He exhibits no edema or tenderness.  Left foot and shin cool to touch.   Neurological: He is alert and oriented to person, place, and time.  Left inattention and has bouts of lability/impulsivity.  Lacks insight into  deficits. Delayed processing in part due to anxiety but improved with encouragement.  Dysphonic speech with PMSV--needs occasional cues to clear secretions. Senses pinch and gross touch LUE and LLE. MMT: LUE- tr-1/5 delt, bicep, tr/5 tricep, wrist, hand. LLE- tr-1 HF,KE and tr/5 ADF/PF. RUE and RLE move spontaneously.  Skin: Skin is warm and dry. He is not diaphoretic.  Psychiatric: His mood appears anxious. His speech is slurred. He is slowed. He expresses inappropriate judgment.    Results for orders placed or performed during the hospital encounter of 08/15/15 (from the past 48 hour(s))  Glucose, capillary     Status: Abnormal   Collection Time: 08/30/15  4:30 PM  Result Value Ref Range   Glucose-Capillary 128 (H) 65 - 99 mg/dL  Glucose, capillary     Status: Abnormal   Collection Time: 08/30/15 10:34 PM  Result Value Ref Range   Glucose-Capillary 111 (H) 65 - 99 mg/dL   Comment 1 Notify RN    Comment 2 Document in Chart   Glucose, capillary     Status: Abnormal   Collection Time: 08/31/15  6:29 AM  Result Value Ref Range   Glucose-Capillary 116 (H) 65 - 99 mg/dL   Comment 1 Notify RN    Comment 2 Document in Chart   Glucose, capillary     Status: Abnormal   Collection Time: 08/31/15 11:22 AM  Result Value Ref Range   Glucose-Capillary 104 (H) 65 - 99 mg/dL  Glucose, capillary     Status: None   Collection Time: 08/31/15  4:27 PM  Result Value Ref Range   Glucose-Capillary 92 65 - 99 mg/dL  Glucose, capillary     Status: None   Collection Time: 08/31/15  9:28 PM  Result Value Ref Range   Glucose-Capillary 81 65 - 99 mg/dL   Comment 1 Notify RN    Comment 2 Document in Chart   Glucose, capillary     Status: Abnormal   Collection Time: 09/01/15  6:14 AM  Result Value Ref Range   Glucose-Capillary 101 (H) 65 - 99 mg/dL   Comment 1 Notify RN    Comment 2 Document in Chart    Ir Ivc Filter Plmt / S&i /img Guid/mod Sed  08/31/2015  INDICATION: DVT.  Intracranial hemorrhage.  EXAM: IVC FILTER,INFERIOR VENA CAVOGRAM MEDICATIONS: None. ANESTHESIA/SEDATION: Fentanyl 25 mcg IV; Versed 1 mg IV Moderate Sedation Time:  22 minutes The patient was continuously monitored during the procedure by the interventional radiology nurse under my direct supervision. FLUOROSCOPY TIME:  Fluoroscopy Time: 3 minutes 12 seconds (319 mGy). COMPLICATIONS: None immediate. PROCEDURE: Informed written consent was obtained from the patient after a thorough discussion of the procedural risks, benefits and alternatives. All questions were addressed. Maximal Sterile Barrier Technique was utilized including caps, mask, sterile gowns, sterile gloves, sterile drape, hand hygiene and skin antiseptic. A timeout was performed prior to the initiation of the procedure. The right neck was prepped with Betadine in a sterile fashion, and a sterile drape was applied covering the operative field. A sterile gown and sterile gloves were used for the procedure. The right jugular vein was noted to be patent initially with ultrasound. Under  sonographic guidance, a micropuncture needle was inserted into the right jugular vein (Ultrasound image documentation was performed). It was removed over an 018 wire which was up-sized to a [place]. The sheath was inserted over the wire and into the IVC. IVC venography was performed. The temporary filter was then deployed in the infrarenal IVC. The sheath was removed and hemostasis was achieved with direct pressure. FINDINGS: IVC and bilateral renal venography demonstrates renal vein inflow at L1 and no venous anomaly. Final image demonstrates an IVC filter in place with its tip at the lower L1 endplate. IMPRESSION: Successful infrarenal IVC filter placement. This is a temporary filter. It can be removed or remain in place to become permanent. Electronically Signed   By: Jolaine Click M.D.   On: 08/31/2015 15:50       Medical Problem List and Plan: 1.  Left hemiparesis, cognitive and functional  deficits  secondary to right basal ganglia hemorrhage 2.  Left peroneal DVT/Anticoagulation: IVC filter placed on 06/16 3. Pain Management: tylenol prn.  4. Mood: LCSW to follow for evaluation and support.   -spoke to mother who senses a lot of coping issues due to loss of a control and typically being the person others relied on PTA 5. Neuropsych: This patient is not fully capable of making decisions on his own behalf. 6. Skin/Wound Care:  Routine pressure relief measures.  7. Fluids/Electrolytes/Nutrition: Monitor intake on dysphagia 1, nectar liquids.  8. VDRF s/p trach:  Changed to CFS # 6 on 06/14--high risk for OSA. Will need CPAP if decannulated per CCM.  9. Acute on chronic CHF:  Likely due to hypertonic saline. Check daily weights and montor I/O.  10. Malignant HTN: Monitor BP qid--continue  11. Leukocytosis due to MSSA bronchitis v/s PNA and Enterobacter UTI:  Resolving with treatment of UTI.  Monitor temps and for other signs of infection. To continue rocephin thorough 6/18 12.  ABLA: Stable. Will check follow up CBC in am.  13.  Enterobacter UTI: ON rocephin thorough 06/18. Discontinue foley--question bladder spasms. Will add urispas to help with symptoms.    Post Admission Physician Evaluation: 1. Functional deficits secondary  to right basal ganglia hemorrhage. 2. Patient is admitted to receive collaborative, interdisciplinary care between the physiatrist, rehab nursing staff, and therapy team. 3. Patient's level of medical complexity and substantial therapy needs in context of that medical necessity cannot be provided at a lesser intensity of care such as a SNF. 4. Patient has experienced substantial functional loss from his/her baseline which was documented above under the "Functional History" and "Functional Status" headings.  Judging by the patient's diagnosis, physical exam, and functional history, the patient has potential for functional progress which will result in  measurable gains while on inpatient rehab.  These gains will be of substantial and practical use upon discharge  in facilitating mobility and self-care at the household level. 5. Physiatrist will provide 24 hour management of medical needs as well as oversight of the therapy plan/treatment and provide guidance as appropriate regarding the interaction of the two. 6. 24 hour rehab nursing will assist with bladder management, bowel management, safety, skin/wound care, disease management, medication administration, pain management and patient education  and help integrate therapy concepts, techniques,education, etc. 7. PT will assess and treat for/with: Lower extremity strength, range of motion, stamina, balance, functional mobility, safety, adaptive techniques and equipment, NMR, cognitive-behavioral rx, family education.   Goals are: min assist. 8. OT will assess and treat for/with: ADL's, functional mobility, safety, upper  extremity strength, adaptive techniques and equipment, NMR, cognitive-behavioral rx, education, ego support.   Goals are:  Min assist. Therapy may not yet proceed with showering this patient. 9. SLP will assess and treat for/with: cognition, speech, swallowing, trach mgt, education.  Goals are: supervision to min assist. 10. Case Management and Social Worker will assess and treat for psychological issues and discharge planning. 11. Team conference will be held weekly to assess progress toward goals and to determine barriers to discharge. 12. Patient will receive at least 3 hours of therapy per day at least 5 days per week. 13. ELOS: 24-28 days       14. Prognosis:  good     Ranelle OysterZachary T. Enaya Howze, MD, [facility] [id number] [facility] 09/01/2015

## 2015-09-02 ENCOUNTER — Inpatient Hospital Stay (HOSPITAL_COMMUNITY): Payer: 59 | Admitting: Occupational Therapy

## 2015-09-02 ENCOUNTER — Inpatient Hospital Stay (HOSPITAL_COMMUNITY): Payer: 59 | Admitting: Speech Pathology

## 2015-09-02 ENCOUNTER — Inpatient Hospital Stay (HOSPITAL_COMMUNITY): Payer: 59 | Admitting: Physical Therapy

## 2015-09-02 LAB — CBC WITH DIFFERENTIAL/PLATELET
Basophils Absolute: 0 10*3/uL (ref 0.0–0.1)
Basophils Relative: 0 %
Eosinophils Absolute: 0.5 10*3/uL (ref 0.0–0.7)
Eosinophils Relative: 4 %
HCT: 37.5 % — ABNORMAL LOW (ref 39.0–52.0)
Hemoglobin: 12.6 g/dL — ABNORMAL LOW (ref 13.0–17.0)
Lymphocytes Relative: 17 %
Lymphs Abs: 2.1 10*3/uL (ref 0.7–4.0)
MCH: 31.7 pg (ref 26.0–34.0)
MCHC: 33.6 g/dL (ref 30.0–36.0)
MCV: 94.2 fL (ref 78.0–100.0)
Monocytes Absolute: 1.3 10*3/uL — ABNORMAL HIGH (ref 0.1–1.0)
Monocytes Relative: 10 %
Neutro Abs: 8.4 10*3/uL — ABNORMAL HIGH (ref 1.7–7.7)
Neutrophils Relative %: 69 %
Platelets: 360 10*3/uL (ref 150–400)
RBC: 3.98 MIL/uL — ABNORMAL LOW (ref 4.22–5.81)
RDW: 12.7 % (ref 11.5–15.5)
WBC: 12.3 10*3/uL — ABNORMAL HIGH (ref 4.0–10.5)

## 2015-09-02 LAB — COMPREHENSIVE METABOLIC PANEL
ALT: 43 U/L (ref 17–63)
AST: 27 U/L (ref 15–41)
Albumin: 3 g/dL — ABNORMAL LOW (ref 3.5–5.0)
Alkaline Phosphatase: 46 U/L (ref 38–126)
Anion gap: 8 (ref 5–15)
BUN: 27 mg/dL — ABNORMAL HIGH (ref 6–20)
CO2: 25 mmol/L (ref 22–32)
Calcium: 9.2 mg/dL (ref 8.9–10.3)
Chloride: 101 mmol/L (ref 101–111)
Creatinine, Ser: 1.01 mg/dL (ref 0.61–1.24)
GFR calc Af Amer: >60 mL/min (ref >60)
GFR calc non Af Amer: >60 mL/min (ref >60)
Glucose, Bld: 114 mg/dL — ABNORMAL HIGH (ref 65–99)
Potassium: 3.9 mmol/L (ref 3.5–5.1)
Sodium: 134 mmol/L — ABNORMAL LOW (ref 135–145)
Total Bilirubin: 0.3 mg/dL (ref 0.3–1.2)
Total Protein: 7.2 g/dL (ref 6.5–8.1)

## 2015-09-02 MED ORDER — DEXTROSE 5 % IV SOLN
1.0000 g | Freq: Every day | INTRAVENOUS | Status: DC
  Filled 2015-09-02: qty 10

## 2015-09-02 MED ORDER — SULFAMETHOXAZOLE-TRIMETHOPRIM 800-160 MG PO TABS
1.0000 | ORAL_TABLET | Freq: Two times a day (BID) | ORAL | Status: AC
  Administered 2015-09-02 – 2015-09-03 (×4): 1 via ORAL
  Filled 2015-09-02 (×4): qty 1

## 2015-09-02 NOTE — Evaluation (Signed)
Speech Language Pathology Assessment and Plan  Patient Details  Name: Brad Mcgee MRN: [id number] Date of Birth: 07-25-1985  SLP Diagnosis: Cognitive Impairments;Speech and Language deficits;Dysphagia  Rehab Potential: Good ELOS: 3 weeks    Today's Date: 09/02/2015 SLP Individual Time: 8502-7741 SLP Individual Time Calculation (min): 60 min   Problem List:  Patient Active Problem List   Diagnosis Date Noted  . Deep venous thrombosis (DVT) of left peroneal vein ([place]) 09/01/2015  . Dysphagia following nontraumatic intracerebral hemorrhage 09/01/2015  . Morbid obesity ([place]) 09/01/2015  . Nontraumatic acute hemorrhage of basal ganglia ([place]) 09/01/2015  . Tracheostomy in place [facility])   . Leukocytosis   . Fever, unspecified   . Right Basal ganglia hemorrhage ([place])   . Acute respiratory failure ([place])   . Acute respiratory failure with hypoxemia ([place])   . Cytotoxic brain edema ([place]) 08/18/2015  . Brain herniation ([place]) 08/18/2015  . Accelerated hypertension 08/16/2015   Past Medical History:  Past Medical History  Diagnosis Date  . Hypertension   . Fracture of left ankle     treated with cast   Past Surgical History: No past surgical history on file.  Assessment / Plan / Recommendation Clinical Impression Brad Mcgee is a 30 y.o. male with history of hypertension was admitted to [facility] on 08/15/15 with acute onset of left sided weakness. CT head done revealing right basal ganglia infarct and he had decline in MS requiring intubation and transfer to [facility]. Bleed felt to be due to hypertensive emergency and he did have increase in bleed on follow up CT. CTA head and neck negative for stenosis or aneurysm.  2D echo with eF 50-55%, no wall abnormality, grade 2 diastolic dysfunction and no SOE. He was treated with hypertonic saline and required trach 06/7 due to difficulty with vent wean.  He was extubated to ATC but continues to have copious secretions. He developed fever 6/11 therefore  started on Vanc/Zosyn and blood cultures done negative to date. Urine culture positive for enterobacter and PCCM recommends narrowing antibiotics to rocephin.   BLE dopplers done due to edema and revealed LLE peroneal DVT. IVC filter placed on 06/15. He is tolerating PMSV trials and was cleared to start dyshagia 1,nectar liquids as mentation improving. Pt transferred to CIR on 09/01/2015.   On 09/02/2015, Bedside Swallow Evaluation, Speaking Valve Evaluation and Cognitive Linguistic Evaluation provided. Pt presents this day with 6 mm cuffless trach on 28% FiO2 va tach collar with O2 sats at 97% throughout session. Per chart review, pt has been tolerating PMSV during all waking hours without respiratory decline. Pt with PMSV in place at time of evaluation this day. Pt able to phonate and converse without respiratory decline. Pt with reduced vocal intensity and hoarse vocal quality. Pt with moderate deficits in intelligibility at the sentence (~75%) and simple conversation level (~50%) requiring Mod A verbal cues to increase vocal intensity. PO trials given with PMSV in place. Pt presents with moderate oral and pharyngeal dysphagia characterized by severe left side buccal and lingual weakness. Pt with increased ability to hold his head in neutral position during intake while sitting in wheelchair. Pt able to self feed with Min A verbal cues for bite size and rate. Trials of puree placed on right oral cavity by pt. Pt with mildly impaired lingual movement, mild occasional residue (tongue occasionally coated) but timely oral prep. No overt s/s of aspiration noted. Trials of nectar were administered via spoon and with personal water bottle with pull  up spout (filled with nectar thick water). Pt able to squirt small amount of nectar thick liquid in mouth. Pt with timely swallow initiation and no overt s/s of aspiration with nectar thick liquids. Pt's respiration and O2 stats remained at baseline throughout consumption. Pt  with strong cough later in session (not related to PO intake). However pt unable to produce volitional cough. Informal cognitive linguistic assessment performed. Pt with mild cognitive linguistic deficits characterized by difficulty with problem solving complex situations. Pt was able to answer complex yes/no questions, follow 3 step directions, answer orientation questions, and simple reasoning tasks. Pt would benefit from skilled ST while inpatient in order to maximize functional independence and reduce caregiver burden of care prior to discharge.    Skilled Therapeutic Interventions          Bedside Swallow Evaluation, speaking valve evaluation and cognitive linguistic evaluations completed with results and recommendations reviewed with patient and family. Pt's mother Jenny Reichmann) and father Reita Cliche) were present for duration of today's therapy session. Each were able to return demonstration of swallow strategies and were signed off to provide supervision for pt during PO intake. Both father and Mother able to don and duff PMSV but pt requests instruction for increased independence. Education provided to wear PMSV during all waking hours to increase oropharyngeal sensation as well as when eating and speaking. Skilled therapy initiated with Mod A verbal cues for pt to increase vocal intensity when speaking at sentence and conversation level. Mod A verbal cues needed to problem solve current situation (such as pt using right hand to hold left arm from swinging over side of wheelchair). Pt and family voiced understanding of plan of care.    SLP Assessment  Patient will need skilled Speech Lanaguage Pathology Services during CIR admission    Recommendations  Patient may use Passy-Muir Speech Valve: During all waking hours (remove during sleep);Caregiver trained to provide supervision;During PO intake/meals PMSV Supervision: Intermittent SLP Diet Recommendations: Dysphagia 1 (Puree);Nectar Liquid Administration  via: Cup (Personal cup with pull up spout. ) Medication Administration: Crushed with puree Supervision: Trained caregiver to feed patient;Full supervision/cueing for compensatory strategies;Staff to assist with self feeding Compensations: Slow rate;Small sips/bites;Minimize environmental distractions;Follow solids with liquid;Multiple dry swallows after each bite/sip Postural Changes and/or Swallow Maneuvers: Seated upright 90 degrees Oral Care Recommendations: Oral care BID Recommendations for Other Services: Neuropsych consult Patient destination: Home Follow up Recommendations: Outpatient SLP Equipment Recommended: To be determined    SLP Frequency 3 to 5 out of 7 days   SLP Duration  SLP Intensity  SLP Treatment/Interventions 3 weeks  Minumum of 1-2 x/day, 30 to 90 minutes  Cognitive remediation/compensation;Dysphagia/aspiration precaution training;Patient/family education;Oral motor exercises;Speech/Language facilitation;Therapeutic Activities;Functional tasks;Cueing hierarchy;Therapeutic Exercise    Pain Pain Assessment Pain Assessment: No/denies pain  Prior Functioning Cognitive/Linguistic Baseline: Within functional limits Type of Home: House  Lives With: Significant other Available Help at Discharge: Family Vocation: Full time employment  Function:  Eating Eating   Modified Consistency Diet: Yes Eating Assist Level: Supervision or verbal cues;Helper feeds patient;Helper checks for pocketed food;Helper scoops food on utensil     Helper [place] on Utensil: Occasionally     Cognition Comprehension Comprehension assist level: Understands basic 75 - 89% of the time/ requires cueing 10 - 24% of the time  Expression Expression assistive device: Talk trach valve Expression assist level: Expresses basic 50 - 74% of the time/requires cueing 25 - 49% of the time. Needs to repeat parts of sentences.  Social Interaction Social Interaction  assist level: Interacts  appropriately 90% of the time - Needs monitoring or encouragement for participation or interaction.  Problem Solving Problem solving assist level: Solves basic 75 - 89% of the time/requires cueing 10 - 24% of the time  Memory Memory assist level: Recognizes or recalls 75 - 89% of the time/requires cueing 10 - 24% of the time   Short Term Goals: Week 1: SLP Short Term Goal 1 (Week 1): Pt will utilize an increased vocal intensity at the sentence level with Min A verbal cues to achieve 100% intelligibility.  SLP Short Term Goal 2 (Week 1): Pt will perform diaphargmatic breathing exercises with min A verbal cues to maximize breath support for speech intelligibility and cough strength.  SLP Short Term Goal 3 (Week 1): Pt will perform lingual, labial and mandibular resistance exercises to work towards improvoing oral motor strength for advancement of diet with Mod A verbal cues.  SLP Short Term Goal 4 (Week 1): Pt will consume trials of dysphagia 2 diet without s/s of aspiration with Mod A  verbal cues for use of swallowing compensatory strategies.  SLP Short Term Goal 5 (Week 1): Pt will consume trials of ice chips without s/s of aspiration with Min A verbal cues for use of swallowing compensatory strategies.  SLP Short Term Goal 6 (Week 1): Pt will demonstrate functional problem solving for complex tasks with Min A verbal cues.   Refer to Care Plan for Long Term Goals  Recommendations for other services: Neuropsych  Discharge Criteria: Patient will be discharged from SLP if patient refuses treatment 3 consecutive times without medical reason, if treatment goals not met, if there is a change in medical status, if patient makes no progress towards goals or if patient is discharged from hospital.  The above assessment, treatment plan, treatment alternatives and goals were discussed and mutually agreed upon: by patient and by family  Izaias Krupka 09/02/2015, 12:59 PM

## 2015-09-02 NOTE — Progress Notes (Signed)
Sub: sleeping  Easily awakens Denies pain  OBJ:  BP 115/59 mmHg  Pulse 97  Temp(Src) 99.3 F (37.4 C) (Oral)  Resp 21  Ht 5\' 8"  (1.727 m)  Wt 294 lb 5 oz (133.5 kg)  BMI 44.76 kg/m2  SpO2 100%  Overweight male, NAD Trach in place otherwise head and neck exam unremarkable cv- reg rate abd- overwieght Ext- no edema. Heel protectors in place Neuro- alert  A/p:  Medical Problem List and Plan: 1. Left hemiparesis, cognitive and functional deficits secondary to right basal ganglia hemorrhage 2. Left peroneal DVT/Anticoagulation: IVC filter placed on 06/16 3. Pain Management: tylenol prn.  4. Mood: LCSW to follow for evaluation and support.  - 5. Neuropsych: This patient is not fully capable of making decisions on his own behalf. 6. Skin/Wound Care: Routine pressure relief measures.  7. Fluids/Electrolytes/Nutrition: Monitor intake on dysphagia 1, nectar liquids.  Basic Metabolic Panel:    Component Value Date/Time   NA 134* 09/02/2015 0340   K 3.9 09/02/2015 0340   CL 101 09/02/2015 0340   CO2 25 09/02/2015 0340   BUN 27* 09/02/2015 0340   CREATININE 1.01 09/02/2015 0340   GLUCOSE 114* 09/02/2015 0340   CALCIUM 9.2 09/02/2015 0340    8. VDRF s/p trach: Changed to CFS # 6 on 06/14- 9. Acute on chronic CHF: clinically euvolemic. (chest clear) 10. Malignant HTN: Monitor BP qid-- bp controlled: 100/52-125/78 11. Leukocytosis due to MSSA bronchitis v/s PNA and Enterobacter UTI: Resolving with treatment of UTI. Monitor temps and for other signs of infection. To continue abx hrough 6/18 CBC:    Component Value Date/Time   WBC 12.3* 09/02/2015 0340   HGB 12.6* 09/02/2015 0340   HCT 37.5* 09/02/2015 0340   PLT 360 09/02/2015 0340   MCV 94.2 09/02/2015 0340   NEUTROABS 8.4* 09/02/2015 0340   LYMPHSABS 2.1 09/02/2015 0340   MONOABS 1.3* 09/02/2015 0340   EOSABS 0.5 09/02/2015 0340   BASOSABS 0.0 09/02/2015 0340   Wbc improving   12. ABLA:  Stable. Will check follow up CBC in am. - remains stable- see above 13. Enterobacter UTI: there is mention of rocephin in the chart. Allergy to ancef. He never received rocephin and has been on zosyn. Will complete abx through 6/18 with septra. Discussed with pharmacy   Discontinue foley--question bladder spasms. Will add urispas to help with symptoms.

## 2015-09-02 NOTE — Evaluation (Addendum)
Physical Therapy Assessment and Plan  Patient Details  Name: Brad Mcgee MRN: [id number] Date of Birth: [birth year]  PT Diagnosis: Abnormal posture, Abnormality of gait, Cognitive deficits, Difficulty walking, Dizziness and giddiness, Impaired cognition, Impaired sensation and Muscle weakness Rehab Potential: Fair ELOS: 3 weeks   Today's Date: 09/02/2015 PT Individual Time: 1045-1200 PT Individual Time Calculation (min): 75 min    Problem List:  Patient Active Problem List   Diagnosis Date Noted  . Deep venous thrombosis (DVT) of left peroneal vein ([place]) 09/01/2015  . Dysphagia following nontraumatic intracerebral hemorrhage 09/01/2015  . Morbid obesity ([place]) 09/01/2015  . Nontraumatic acute hemorrhage of basal ganglia ([place]) 09/01/2015  . Tracheostomy in place [facility])   . Leukocytosis   . Fever, unspecified   . Right Basal ganglia hemorrhage ([place])   . Acute respiratory failure ([place])   . Acute respiratory failure with hypoxemia ([place])   . Cytotoxic brain edema ([place]) 08/18/2015  . Brain herniation ([place]) 08/18/2015  . Accelerated hypertension 08/16/2015    Past Medical History:  Past Medical History  Diagnosis Date  . Hypertension   . Fracture of left ankle     treated with cast   Past Surgical History: No past surgical history on file.  Assessment & Plan Clinical Impression: Patient is a 30 y.o. year old male with recent admission to the hospital on 08/14/25 with R basal ganglia ICH with course complicated by VDRF and DVT s/p IVC filter.  Patient transferred to CIR on 09/01/2015 .   Patient currently requires total with mobility secondary to muscle weakness, decreased cardiorespiratoy endurance, impaired timing and sequencing, unbalanced muscle activation and decreased motor planning, decreased safety awareness and decreased sitting balance, decreased standing balance, decreased postural control, hemiplegia and decreased balance strategies.  Prior to hospitalization, patient was  independent  with mobility and lived with Significant other in a House home.  Home access is 3Stairs to enter.  Patient will benefit from skilled PT intervention to maximize safe functional mobility, minimize fall risk and decrease caregiver burden for planned discharge home with 24 hour assist.  Anticipate patient will benefit from follow up [facility] at discharge.  PT - End of Session Endurance Deficit: Yes PT Assessment Rehab Potential (ACUTE/IP ONLY): Fair Barriers to Discharge: Inaccessible home environment PT Patient demonstrates impairments in the following area(s): Balance;Endurance;Nutrition;Pain;Motor;Safety;Sensory;Skin Integrity;Edema;Behavior PT Transfers Functional Problem(s): Bed Mobility;Bed to Chair;Car;Furniture;Floor PT Locomotion Functional Problem(s): Ambulation;Wheelchair Mobility;Stairs PT Plan PT Intensity: Minimum of 1-2 x/day ,45 to 90 minutes PT Frequency: Total of 15 hours over 7 days of combined therapies PT Duration Estimated Length of Stay: 3 weeks PT Treatment/Interventions: Ambulation/gait training;DME/adaptive equipment instruction;UE/LE Strength taining/ROM;Balance/vestibular training;Functional electrical stimulation;UE/LE Coordination activities;Cognitive remediation/compensation;Functional mobility training;Visual/perceptual remediation/compensation;Splinting/orthotics;Community reintegration;Neuromuscular re-education;Stair training;Wheelchair propulsion/positioning;Discharge planning;Therapeutic Activities;Disease management/prevention;Patient/family education;Therapeutic Exercise PT Transfers Anticipated Outcome(s): CGA PT Locomotion Anticipated Outcome(s): CGA PT Recommendation Recommendations for Other Services: Neuropsych consult;Speech consult Follow Up Recommendations: Home health PT;24 hour supervision/assistance Patient destination: Home Equipment Recommended: To be determined Equipment Details: TBD  Skilled Therapeutic Intervention  Pt performs  transfers x3 in session. Scooting x5 in session. Pt performs forced LUE use in sitting as active rest. LUE forced use with weight shifts 2x10. Pec stretch 2x30". Gastroc stretch x30".  Partial sit to stand x5 with facilitation of neutral spine. Pt and family educated on rehab plan, rehab goals, progressing mobility, deficits, and safety in mobility. Pt's w/c changed to tilt in space for improved safety and support.  PT Evaluation Precautions/Restrictions Precautions Precautions: Fall Precaution Comments: watch BP, and  O2 saturations on ATC Restrictions Weight Bearing Restrictions: No General Chart Reviewed: Yes Vital SignsTherapy Vitals Pulse Rate: (!) 108 Oxygen Therapy SpO2: 100 % Pain Pain Assessment Pain Assessment: No/denies pain Home Living/Prior Functioning Home Living Available Help at Discharge: Family Type of Home: House Home Access: Stairs to enter Technical brewer of Steps: 3 Entrance Stairs-Rails: None Home Layout: One level Bathroom Shower/Tub: Tub/shower unit;Walk-in shower Bathroom Toilet: Standard Bathroom Accessibility: Yes Additional Comments: Very supportive family. Father reports they can make home modifications as needed.  Lives With: Significant other Prior Function Level of Independence: Independent with basic ADLs;Independent with transfers;Independent with gait Vocation: Full time employment Vocation Requirements: at [place]: Hobbies-yes (Comment) Comments: farming, hunting Vision/Perception  Vision - Assessment Ocular Range of Motion: Within Functional Limits Tracking/Visual Pursuits: Able to track stimulus in all quads without difficulty Additional Comments: Pt did not report any chanages in vision, will need to assess further in functional environment  Cognition Overall Cognitive Status: Impaired/Different from baseline Arousal/Alertness: Awake/alert Orientation Level: Oriented X4 Attention: Focused;Sustained;Selective Focused  Attention: Appears intact Sustained Attention: Appears intact Selective Attention: Appears intact Memory: Appears intact Awareness: Impaired Awareness Impairment: Emergent impairment Problem Solving: Impaired Problem Solving Impairment: Verbal complex;Functional complex Executive Function: Self Monitoring;Self Correcting Self Monitoring: Impaired Self Monitoring Impairment: Verbal complex;Functional complex Self Correcting: Impaired Self Correcting Impairment: Verbal complex;Functional complex Safety/Judgment: Impaired Comments: labile throughout session, cues for attention to task, follwing basic one step commands. Decreased awareness of deficits Sensation Sensation Additional Comments: difficult to assess due to cognnition; reports intact sensation L UE.  Coordination Gross Motor Movements are Fluid and Coordinated: No Fine Motor Movements are Fluid and Coordinated: No Coordination and Movement Description: L hemi; Flaccid L UE Finger Nose Finger Test: WFL using R UE Motor  Motor Motor: Hemiplegia;Abnormal postural alignment and control Motor - Skilled Clinical Observations: Strong L lean in sitting  Mobility Bed Mobility Bed Mobility: Rolling Right;Rolling Left;Supine to Sit Rolling Right: 2: Max assist Rolling Right Details (indicate cue type and reason): with cues for weight shift and technique Rolling Left: 2: Max assist Rolling Left Details (indicate cue type and reason): with cues for weight shift and technique Supine to Sit: 1: +2 Total assist Supine to Sit Details (indicate cue type and reason): with cues for weight shift and technique Transfers Transfers: Yes Sit to Stand: 1: +2 Total assist (for partial stand) Sit to Stand Details (indicate cue type and reason): with cues for weight shift and technique Stand to Sit: 1: +2 Total assist (from partial stand) Stand to Sit Details: with cues for weight shift and technique Squat Pivot Transfers: 1: +2 Total  assist Squat Pivot Transfer Details (indicate cue type and reason): with cues for weight shift and technique Locomotion  Ambulation Ambulation: No (unsafe to attempt)  Trunk/Postural Assessment  Cervical Assessment Cervical Assessment: Exceptions to [facility] (Flexed posture) Thoracic Assessment Thoracic Assessment: Exceptions to [facility] (Kyphotic) Lumbar Assessment Lumbar Assessment: Exceptions to [facility] (Posterior pelvic tilt) Postural Control Postural Control:  (Strong L lean; R pusher)  Balance Balance Balance Assessed: Yes Static Sitting Balance Static Sitting - Level of Assistance: 2: Max assist;1: +1 Total assist Static Sitting - Comment/# of Minutes: Strong L lean Dynamic Sitting Balance Dynamic Sitting - Balance Support: During functional activity Dynamic Sitting - Level of Assistance: 1: +1 Total assist;1: +2 Total assist Static Standing Balance Static Standing - Level of Assistance: 1: +2 Total assist Dynamic Standing Balance Dynamic Standing - Level of Assistance: 1: +2 Total assist Extremity Assessment  RUE Assessment RUE Assessment: Exceptions to [facility] RUE AROM (degrees) Overall AROM Right Upper Extremity: Within functional limits for tasks performed RUE Strength RUE Overall Strength Comments: grossly 4/5 LUE Assessment LUE Assessment: Exceptions to WFL LUE AROM (degrees) Overall AROM Left Upper Extremity: Deficits LUE Overall AROM Comments: minimal AROM LUE PROM (degrees) Overall PROM Left Upper Extremity: Within functional limits for tasks assessed LUE Strength LUE Overall Strength Comments: grossly 1/5 RLE Assessment RLE Assessment: Exceptions to [facility] RLE AROM (degrees) Overall AROM Right Lower Extremity: Within functional limits for tasks assessed RLE Strength RLE Overall Strength: Deficits RLE Overall Strength Comments: grossly 4/5 LLE Assessment LLE Assessment: Exceptions to WFL LLE AROM (degrees) Overall AROM Left Lower Extremity: Deficits LLE Overall AROM  Comments: minimal AROM LLE PROM (degrees) Overall PROM Left Lower Extremity: Within functional limits for tasks assessed LLE Strength LLE Overall Strength: Deficits LLE Overall Strength Comments: grossly 1/5   See Function Navigator for Current Functional Status.   Refer to Care Plan for Long Term Goals  Recommendations for other services: Neuropsych  Discharge Criteria: Patient will be discharged from PT if patient refuses treatment 3 consecutive times without medical reason, if treatment goals not met, if there is a change in medical status, if patient makes no progress towards goals or if patient is discharged from hospital.  The above assessment, treatment plan, treatment alternatives and goals were discussed and mutually agreed upon: by patient  Monia Pouch 09/02/2015, 12:38 PM

## 2015-09-02 NOTE — Evaluation (Signed)
Occupational Therapy Assessment and Plan  Patient Details  Name: Brad Mcgee MRN: [id number] Date of Birth: [birth year]  OT Diagnosis: abnormal posture, cognitive deficits, flaccid hemiplegia and hemiparesis, hemiplegia affecting non-dominant side and muscle weakness (generalized) Rehab Potential: Rehab Potential (ACUTE ONLY): Good ELOS: 3 weeks   Today's Date: 09/02/2015 OT Individual Time: 1025-8527 OT Individual Time Calculation (min): 75 min     Problem List:  Patient Active Problem List   Diagnosis Date Noted  . Deep venous thrombosis (DVT) of left peroneal vein ([place]) 09/01/2015  . Dysphagia following nontraumatic intracerebral hemorrhage 09/01/2015  . Morbid obesity ([place]) 09/01/2015  . Nontraumatic acute hemorrhage of basal ganglia ([place]) 09/01/2015  . Tracheostomy in place [facility])   . Leukocytosis   . Fever, unspecified   . Right Basal ganglia hemorrhage ([place])   . Acute respiratory failure ([place])   . Acute respiratory failure with hypoxemia ([place])   . Cytotoxic brain edema ([place]) 08/18/2015  . Brain herniation ([place]) 08/18/2015  . Accelerated hypertension 08/16/2015    Past Medical History:  Past Medical History  Diagnosis Date  . Hypertension   . Fracture of left ankle     treated with cast   Past Surgical History: No past surgical history on file.  Assessment & Plan Clinical Impression: Brad Mcgee is a 30 y.o. male with history of hypertension was admitted to [facility] on 08/15/15 with acute onset of left sided weakness. CT head done revealing right basal ganglia infarct and he had decline in MS requiring intubation and transfer to [facility] Bleed felt to be due to hypertensive emergency and he did have increase in bleed on follow up CT. CTA head and neck negative for stenosis or aneurysm. 2D echo with eF 50-55%, no wall abnormality, grade 2 diastolic dysfunction and no SOE. He was treated with hypertonic saline and required trach 06/7 due to difficulty with vent wean. He was  extubated to ATC but continues to have copious secretions. He developed fever 6/11 therefore started on Vanc/Zosyn and blood cultures done negative to date. Urine culture positive for enterobacter and PCCM recommends narrowing antibiotics to rocephin. BLE dopplers done due to edema and revealed LLE peroneal DVT. IVC filter placed on 06/15. He is tolerating PMSV trials and was cleared to start dyshagia 1,nectar liquids as mentation improving.  Patient transferred to CIR on 09/01/2015 .    Patient currently requires total with basic self-care skills secondary to muscle weakness, decreased cardiorespiratoy endurance, abnormal tone, unbalanced muscle activation, decreased coordination and decreased motor planning, decreased initiation, decreased attention, decreased awareness, decreased problem solving, decreased safety awareness and delayed processing and decreased sitting balance, decreased postural control, hemiplegia, decreased balance strategies and difficulty maintaining precautions.  Prior to hospitalization, patient could complete ADLs/IADLs with independent .  Patient will benefit from skilled intervention to decrease level of assist with basic self-care skills and increase independence with basic self-care skills prior to discharge home with care partner.  Anticipate patient will require minimal physical assistance and follow up home health.  OT - End of Session Endurance Deficit: Yes   Skilled Therapeutic Intervention Pt seen for OT Eval and ADL bathing session. Pt asleep in supine upon arrival, easily awoken and agreeable to tx session. Family present upon arrival and assisted with answering PLOF questions and regarding home layout however did not stay in room for tx session. Transferred to EOB with assist. Attempted +2 squat pivot transfer to w/c, however, due to strong L lean and inability to initate/ follow commands,  did not feel transfer was safe at this time. Used STEADY with +2 assist to  transfer pt to w/c. Completed bathing from w/c level, sit <> stands using STEADY for buttock hygiene and to don new brief. Required total A for sitting on STEADY due to strong L lean. Hospital gown donned as pt without clothes at this time. See functional navigator for assist level.  Pt left in w/c at end of session with L UE supported on half lap tray.  OT Evaluation Precautions/Restrictions  Precautions Precautions: Fall Precaution Comments: watch BP, and O2 saturations on ATC Restrictions Weight Bearing Restrictions: No General Chart Reviewed: Yes Pain Pain Assessment Pain Assessment: No/denies pain Home Living/Prior Functioning Home Living Family/patient expects to be discharged to:: Unsure Living Arrangements: Spouse/significant other Available Help at Discharge: Family Type of Home: House Home Access: Stairs to enter Technical brewer of Steps: 3 Entrance Stairs-Rails: None Home Layout: One level Bathroom Shower/Tub: Tub/shower unit, Multimedia programmer: Standard Bathroom Accessibility: Yes Additional Comments: Very supportive family. Father reports they can make home modifications as needed.  Lives With: Significant other (Plans to d/c to mother's home) IADL History Homemaking Responsibilities: Yes Prior Function Level of Independence: Independent with basic ADLs, Independent with transfers, Independent with gait Vocation: Full time employment Vocation Requirements: at [employer]: Hobbies-yes (Comment) Comments: farming, hunting Vision/Perception  Vision- History Baseline Vision/History: No visual deficits Patient Visual Report: Peripheral vision impairment Vision- Assessment Vision Assessment?: Yes Ocular Range of Motion: Within Functional Limits Tracking/Visual Pursuits: Able to track stimulus in all quads without difficulty Visual Fields: Impaired-to be further tested in functional context;No apparent deficits (None noted, however, need to  assess in more functional environment) Additional Comments: Pt did not report any chanages in vision, will need to assess further in functional environment  Cognition Overall Cognitive Status: Impaired/Different from baseline Arousal/Alertness: Awake/alert Orientation Level: Person;Place;Situation Person: Oriented Place: Oriented Situation: Oriented Year: 2017 Month:  (Unable to state month) Day of Week: Correct Memory: Appears intact Immediate Memory Recall: Sock;Blue;Bed Memory Recall: Sock;Blue;Bed Memory Recall Sock: Without Cue Memory Recall Blue: Without Cue Memory Recall Bed: Without Cue Awareness: Impaired Awareness Impairment: Intellectual impairment Problem Solving: Impaired Problem Solving Impairment: Verbal complex;Functional complex Safety/Judgment: Impaired Comments: labile throughout session, cues for attention to task, follwing basic one step commands. Decreased awareness of deficits Sensation Sensation Additional Comments: difficult to assess due to cognnition; reports intact sensation L UE.  Coordination Gross Motor Movements are Fluid and Coordinated: No Fine Motor Movements are Fluid and Coordinated: No Coordination and Movement Description: L hemi; Flaccid L UE Finger Nose Finger Test: WFL using R UE Motor  Motor Motor: Hemiplegia;Abnormal postural alignment and control Motor - Skilled Clinical Observations: Strong L lean in sitting  Trunk/Postural Assessment  Cervical Assessment Cervical Assessment: Exceptions to [facility] (Flexed posture) Thoracic Assessment Thoracic Assessment: Exceptions to [facility] (Kyphotic) Lumbar Assessment Lumbar Assessment: Exceptions to [facility] (Posterior pelvic tilt) Postural Control Postural Control:  (Strong L lean; R pusher)  Balance Balance Balance Assessed: Yes Static Sitting Balance Static Sitting - Level of Assistance: 2: Max assist;1: +1 Total assist Static Sitting - Comment/# of Minutes: Strong L lean Dynamic Sitting  Balance Dynamic Sitting - Balance Support: During functional activity Dynamic Sitting - Level of Assistance: 1: +1 Total assist;1: +2 Total assist Static Standing Balance Static Standing - Level of Assistance: 1: +2 Total assist Dynamic Standing Balance Dynamic Standing - Level of Assistance: 1: +2 Total assist Extremity/Trunk Assessment RUE Assessment RUE Assessment: Exceptions to [facility] RUE AROM (  degrees) Overall AROM Right Upper Extremity: Within functional limits for tasks performed RUE Strength RUE Overall Strength Comments: grossly 4/5 LUE Assessment LUE Assessment: Within Functional Limits (Flacid L UE, minimal initiation of L gross grasp) LUE AROM (degrees) Overall AROM Left Upper Extremity: Deficits   See Function Navigator for Current Functional Status.   Refer to Care Plan for Long Term Goals  Recommendations for other services: Neuropsych  Discharge Criteria: Patient will be discharged from OT if patient refuses treatment 3 consecutive times without medical reason, if treatment goals not met, if there is a change in medical status, if patient makes no progress towards goals or if patient is discharged from hospital.  The above assessment, treatment plan, treatment alternatives and goals were discussed and mutually agreed upon: by patient  Ernestina Patches 09/02/2015, 12:14 PM

## 2015-09-03 ENCOUNTER — Inpatient Hospital Stay (HOSPITAL_COMMUNITY): Payer: 59 | Admitting: Physical Therapy

## 2015-09-03 NOTE — Progress Notes (Signed)
Scanned pt at 3:30 for 35ml d/t not voiding over 8hrs. Pt's brief was dry, allowed pt a little more time to void or to cath. Returned at 6:15 to cath with no urine return and pt still dry. Bladder scan pt again for 74ml at 6:30am. Pt tried to urinate but no luck. Will continue to monitor. Merdis DelayKristina Morgen Linebaugh, RN

## 2015-09-03 NOTE — Progress Notes (Signed)
Physical Therapy Session Note  Patient Details  Name: Brad Mcgee MRN: [id number] Date of Birth: 08/21/1985  Today's Date: 09/03/2015 PT Individual Time: 1400-1445 PT Individual Time Calculation (min): 45 min   Short Term Goals: Week 1:  PT Short Term Goal 1 (Week 1): Pt with be CGA for bed mobility PT Short Term Goal 2 (Week 1): Pt will be CGA for transfers PT Short Term Goal 3 (Week 1): Pt will initiate gait activities  Skilled Therapeutic Interventions/Progress Updates:  Pt was seen bedside in the pm. Pt transferred supine to edge of bed with side rail, head of bed elevated and +2 assist. Pt tolerated edge of bed about 5 minutes with min to max A and verbal cues. Pt transferred edge of bed to w/c with sliding board and +2 assist. Pt transported to rehab gym. In gym treatment focused on standing in parallel bars. While standing focused on upright posture and standing tolerance. Pt stood in parallel bars x 9 with max A, verbal cues and second person for safety. Pt tolerated standing about 10 to 15 seconds. Pt returned to room following treatment and left sitting up in tilt in space chair with call bell within reach.   Therapy Documentation Precautions:  Precautions Precautions: Fall Precaution Comments: watch BP, and O2 saturations on ATC Restrictions Weight Bearing Restrictions: No General:   Vital Signs: Therapy Vitals Oxygen Therapy SpO2: 96 % O2 Device: Tracheostomy Collar O2 Flow Rate (L/min): 5 L/min FiO2 (%): 28 % Pain: No c/o pain.  See Function Navigator for Current Functional Status.   Therapy/Group: Individual Therapy  Rayford HalstedMitchell, [name] 09/03/2015, 3:02 PM

## 2015-09-03 NOTE — Progress Notes (Signed)
Sub:   Feels well. Has no complaints. He is frustrated with lack of movement of his left arm.  OBJ:  BP 98/52 mmHg  Pulse 88  Temp(Src) 98.9 F (37.2 C) (Axillary)  Resp 18  Ht 5\' 8"  (1.727 m)  Wt 271 lb 3.2 oz (123.016 kg)  BMI 41.25 kg/m2  SpO2 100%  Overweight male, NAD Trach in place otherwise head and neck exam unremarkable cv- reg rate Chest-clear to auscultation after coughing. abd- overwieght Ext- no edema.  Neuro- alert Left hemiparesis.  A/p:  Medical Problem List and Plan: 1. Left hemiparesis, cognitive and functional deficits secondary to right basal ganglia hemorrhage 2. Left peroneal DVT/Anticoagulation: IVC filter placed on 06/16 3. Pain Management: tylenol prn.  4. Mood: LCSW to follow for evaluation and support.  - 5. Neuropsych: This patient is not fully capable of making decisions on his own behalf. 6. Skin/Wound Care: Routine pressure relief measures.  7. Fluids/Electrolytes/Nutrition: Monitor intake on dysphagia 1, nectar liquids.  Basic Metabolic Panel:    Component Value Date/Time   NA 134* 09/02/2015 0340   K 3.9 09/02/2015 0340   CL 101 09/02/2015 0340   CO2 25 09/02/2015 0340   BUN 27* 09/02/2015 0340   CREATININE 1.01 09/02/2015 0340   GLUCOSE 114* 09/02/2015 0340   CALCIUM 9.2 09/02/2015 0340    8. VDRF s/p trach: Changed to CFS # 6 on 06/14- 9. Acute on chronic CHF: clinically euvolemic. (chest clear) 10. Malignant HTN: Monitor BP qid-- bp controlled: 98/52-123/60 11. Leukocytosis due to MSSA bronchitis v/s PNA and Enterobacter UTI: Resolving with treatment of UTI. Monitor temps and for other signs of infection. To continue abx hrough 6/18 CBC:    Component Value Date/Time   WBC 12.3* 09/02/2015 0340   HGB 12.6* 09/02/2015 0340   HCT 37.5* 09/02/2015 0340   PLT 360 09/02/2015 0340   MCV 94.2 09/02/2015 0340   NEUTROABS 8.4* 09/02/2015 0340   LYMPHSABS 2.1 09/02/2015 0340   MONOABS 1.3* 09/02/2015 0340    EOSABS 0.5 09/02/2015 0340   BASOSABS 0.0 09/02/2015 0340   Wbc improving   12. ABLA: Stable. Will check follow up CBC in am. - remains stable- see above 13. Enterobacter UTI: there is mention of rocephin in the chart. Allergy to ancef. He never received rocephin and has been on zosyn. Will complete abx through 6/18 with septra. Discussed with pharmacy   Discontinue foley--question bladder spasms. Will add urispas to help with symptoms.

## 2015-09-04 ENCOUNTER — Inpatient Hospital Stay (HOSPITAL_COMMUNITY): Payer: 59 | Admitting: Speech Pathology

## 2015-09-04 ENCOUNTER — Inpatient Hospital Stay (HOSPITAL_COMMUNITY): Payer: 59 | Admitting: Occupational Therapy

## 2015-09-04 ENCOUNTER — Inpatient Hospital Stay (HOSPITAL_COMMUNITY): Payer: 59 | Admitting: Physical Therapy

## 2015-09-04 DIAGNOSIS — D72829 Elevated white blood cell count, unspecified: Secondary | ICD-10-CM

## 2015-09-04 DIAGNOSIS — I619 Nontraumatic intracerebral hemorrhage, unspecified: Secondary | ICD-10-CM

## 2015-09-04 DIAGNOSIS — D62 Acute posthemorrhagic anemia: Secondary | ICD-10-CM | POA: Diagnosis present

## 2015-09-04 LAB — HEMOGLOBIN A1C
Hgb A1c MFr Bld: 5.6 % (ref 4.8–5.6)
Mean Plasma Glucose: 114 mg/dL

## 2015-09-04 MED ORDER — FLUOXETINE HCL 10 MG PO CAPS
10.0000 mg | ORAL_CAPSULE | Freq: Every day | ORAL | Status: DC
  Administered 2015-09-04 – 2015-09-15 (×12): 10 mg via ORAL
  Filled 2015-09-04 (×12): qty 1

## 2015-09-04 NOTE — Progress Notes (Signed)
Trach care done by RT. RT placed pt on RA due to stable sats. Pt in no distress

## 2015-09-04 NOTE — Progress Notes (Signed)
Social Work  Social Work Assessment and Plan  Patient Details  Name: Brad Mcgee MRN: [id number] Date of Birth: 07/15/1985  Today's Date: 09/04/2015  Problem List:  Patient Active Problem List   Diagnosis Date Noted  . Acute blood loss anemia   . Deep venous thrombosis (DVT) of left peroneal vein ([facility]) 09/01/2015  . Dysphagia following nontraumatic intracerebral hemorrhage 09/01/2015  . Morbid obesity ([facility]) 09/01/2015  . Nontraumatic acute hemorrhage of basal ganglia ([facility]) 09/01/2015  . Tracheostomy in place [facility]   . Leukocytosis   . Fever, unspecified   . Right Basal ganglia hemorrhage ([facility])   . Acute respiratory failure ([facility])   . Acute respiratory failure with hypoxemia ([facility])   . Cytotoxic brain edema ([facility]) 08/18/2015  . Brain herniation ([facility]) 08/18/2015  . Accelerated hypertension 08/16/2015   Past Medical History:  Past Medical History  Diagnosis Date  . Hypertension   . Fracture of left ankle     treated with cast   Past Surgical History: No past surgical history on file. Social History:  reports that he has never smoked. He has never used smokeless tobacco. He reports that he does not drink alcohol or use illicit drugs.  Family / Support Systems Marital Status: Single (has a girlfriend of ~ 8 mos, Gaylene BrooksSamantha Hauser) Patient Roles: Partner, Other (Comment) (son, brother, employee) Children: NA Other Supports: father, Aileen PilotDarryl [place] @ [phone](C) [phone]; mother, Lillia MountainSheidan "Cindy" Daye @ (C) [phone];  grandmother, Leroy SeaMary Daye @ (C) [phone];  aunt, Laurita QuintDevonia Daye @ (C) [phone];  sisters:  Josephine CablesLakara, Dashelle, Jessica and Maxine.CopierBria Anticipated Caregiver: Mom, grandmother, Dad and family Ability/Limitations of Caregiver: family all work but are Designer, television/film setrotating schedules Caregiver Availability: 24/7 Family Dynamics: Father very attentive and encouraging to pt during assessment interview.  Great-Uncle arrived during interview and very affectionate and supportive as well.  Father reports that all  family members are supportive to pt and to one another.  Good relationship between father and mother (they are divorced).  Father notes good communication and all working together to provide all needed support.  Social History Preferred language: English Religion: Baptist Cultural Background: NA Education: HS Read: Yes Write: Yes Employment Status: Employed Name of Employer: [employer] Length of Employment: 7 (yrs) Return to Work Plans: TBD;  Pt reports he has both ST and [employer] coverage Legal Hisotry/Current Legal Issues: None Guardian/Conservator: None  - per MD, pt is not yet fully capable of making decisions on his own behalf.  Defer to parents.   Abuse/Neglect Physical Abuse: Denies Verbal Abuse: Denies Sexual Abuse: Denies Exploitation of patient/patient's resources: Denies Self-Neglect: Denies  Emotional Status Pt's affect, behavior adn adjustment status: Pt able complete assessment interview without much difficulty and answers are confirmed by father.  He quickly becomes tearful with each question which is likely due to stroke.  He appears to be responsive to family members and speaks very affectionately about them.  He denies any significant emotional distress and is motivated for CIR therapies.  I do feel he will benefit from neuropsychology consult and will arrange this as indicated. Recent Psychosocial Issues: None Pyschiatric History: None Substance Abuse History: None  Patient / Family Perceptions, Expectations & Goals Pt/Family understanding of illness & functional limitations: Pt and father with good understanding of his ICH - referring to it as a stroke - and aware likely due to HTN crisis.  Good understanding of his functional limitations and need for CIR. Premorbid pt/family roles/activities: Completely independent PTA, working f/t, girlfriend Anticipated changes  in roles/activities/participation: Tx anticipating min assist goals.  Father reports that mother, sister and  grandmother to assume primary caregiver roles. Pt/family expectations/goals: "I want to walk"  [employer]: None Premorbid Home Care/DME Agencies: None Transportation available at discharge: yes Resource referrals recommended: Neuropsychology, Support group (specify)  Discharge Planning Living Arrangements:  (girlfriend living with him) Support Systems: Spouse/significant other, Parent, Other relatives, Friends/neighbors Type of Residence: Private residence Community education officer Resources: Media planner (specify) Education officer, museum) Financial Resources: Employment Financial Screen Referred: No Living Expenses: Own Money Management: Patient Does the patient have any problems obtaining your medications?: No Home Management: pt and gf PTA Patient/Family Preliminary Plans: Pt to d/c home with mother and sister in [place] Social Work Anticipated Follow Up Needs: HH/OP Expected length of stay: 3 weeks  Clinical Impression Very unfortunate gentleman here following an ICH expected caused by HTN.  Pt and family report he had just begun taking BP meds 3 weeks PTA.  Pt labile during interview and likely due to stroke.  Father reports family members and pt's girlfriend are all very supportive and fully planning to arrange 24/7 assistance.  Plan for pt to d/c home with mother, sister and grandmother as primary caregivers.  Denies any significant emotional distress but feel a referral for neuropsychology will be helpful.  Will follow for d/c planning and support needs.   Dinesh Ulysse 09/04/2015, 1:31 PM

## 2015-09-04 NOTE — Progress Notes (Signed)
Orthopedic Tech Progress Note Patient Details:  Brad Mcgee 05/08/1985 [id number]  Patient ID: Brad Mcgee, male   DOB: 02/15/1986, 30 y.o.   MRN: [id number]   [name], [name] 09/04/2015, 4:22 PM Brace order completed by hanger vendor.

## 2015-09-04 NOTE — Progress Notes (Signed)
RT came to do trach check- pt unavailable at this time. RT will continue to follow

## 2015-09-04 NOTE — Progress Notes (Signed)
Physical Therapy Session Note  Patient Details  Name: Brad Mcgee MRN: [id number] Date of Birth: 30-Sep-1985  Today's Date: 09/04/2015 PT Individual Time: 1515-1630 PT Individual Time Calculation (min): 75 min   Short Term Goals: Week 1:  PT Short Term Goal 1 (Week 1): Pt with be CGA for bed mobility PT Short Term Goal 2 (Week 1): Pt will be CGA for transfers PT Short Term Goal 3 (Week 1): Pt will initiate gait activities  Skilled Therapeutic Interventions/Progress Updates:    Pt initially sleeping soundly upon PT arrival and missed first 15 minutes of session, but once awake agreeable to therapy session, stating "I'm all right" when asked about pain levels.  Session focus on midline orientation, postural control, bed mobility, transfers, w/c positioning, and pt education.  PT provided pt education throughout session regarding POC, PT goals, treatment, etc. And pt verbalized understanding.  Also switched w/c cushion to deep contour [name] cushion as pt states previous cushion was uncomfortable.    Max assist for rolling to R side with facilitation for L knee flexion and positioning of LLE and LUE during roll.  PT provided mod assist for LLE and some trunk elevation for pt to come from R side lying to sitting with bed rail and HOB slightly elevated.  Static sitting balance at EOB focus on weight bearing through LLE and L elbow, maintaining midline, and upright posture.  PT provided education regarding pushing syndrome, education to continue.  Total +2 assist for slide board transfer from EOB>w/c on L with max multimodal cues.  PT provided manual facilitation for LLE placement, forward weight shift, trunk position, and socoting into chair.  Once in chair pt able to position with max assist.  Mirror provided for visual feedback to pt on positioning in chair, PT positioned so that trunk was in line and pelvis was neutral on 2 occasions during session.  Pt requesting on 2 occasions during session to use  urinal but was unsuccessful at urinated; requested to do toilet transfer but PT explained challenges and need for +2 for safety.  Pt verbalized understanding.   Pt left in tilt in space w/c at end of session with call bell in reach and needs met.   Therapy Documentation Precautions:  Precautions Precautions: Fall Precaution Comments: watch BP, and O2 saturations on ATC Restrictions Weight Bearing Restrictions: No General: PT Amount of Missed Time (min): 15 Minutes PT Missed Treatment Reason: Patient fatigue   See Function Navigator for Current Functional Status.   Therapy/Group: Individual Therapy  [name] 09/04/2015, 4:42 PM

## 2015-09-04 NOTE — Progress Notes (Signed)
[name]     PROGRESS NOTE  Subjective/Complaints:  Pt laying in bed this AM.  He slept well overnight and states he had a good weekend.    ROS: Denies CP, SOB, N/V/D.  Objective: Vital Signs: Blood pressure 97/65, pulse 87, temperature 98.6 F (37 C), temperature source Oral, resp. rate 16, height 5\' 8"  (1.727 m), weight 124.422 kg (274 lb 4.8 oz), SpO2 98 %. No results found.  Recent Labs  09/02/15 0340  WBC 12.3*  HGB 12.6*  HCT 37.5*  PLT 360    Recent Labs  09/02/15 0340  NA 134*  K 3.9  CL 101  GLUCOSE 114*  BUN 27*  CREATININE 1.01  CALCIUM 9.2   CBG (last 3)   Recent Labs  09/01/15 1145 09/01/15 1733  GLUCAP 104* 91    Wt Readings from Last 3 Encounters:  09/04/15 124.422 kg (274 lb 4.8 oz)  09/01/15 126.3 kg (278 lb 7.1 oz)  08/01/15 135.172 kg (298 lb)    Physical Exam:  BP 97/65 mmHg  Pulse 87  Temp(Src) 98.6 F (37 C) (Oral)  Resp 16  Ht 5\' 8"  (1.727 m)  Wt 124.422 kg (274 lb 4.8 oz)  BMI 41.72 kg/m2  SpO2 98% Constitutional: He appears well-developed and well-nourished. No distress.  Obese HENT: Normocephalic and atraumatic.  Eyes: Conjunctivae and EOM are normal.   Neck: #6 Cuffless trach.  Cardiovascular: Regular rate and rhythm.   Respiratory: Effort normal. No accessory muscle usage or stridor. No respiratory distress.   GI: Soft. Bowel sounds are normal. He exhibits no distension. There is no tenderness.  Musculoskeletal: He exhibits no edema or tenderness.  Neurological: He is alert and oriented x3.  Sensation intact to light touch.  Motor: RUE/RLE: 5/5 proximal to distal LUE: 0/5 proximal to distal LLE: 2/5 proximal to distal Skin: Skin is warm and dry. He is not diaphoretic.  Psychiatric: His mood and behavior appear normal.    Assessment/Plan: 1. Functional deficits secondary to right basal ganglia hemorrhage which require 3+ hours per day of interdisciplinary therapy in a  comprehensive inpatient rehab setting. Physiatrist is providing close team supervision and 24 hour management of active medical problems listed below. Physiatrist and rehab team continue to assess barriers to discharge/monitor patient progress toward functional and medical goals.  Function:  Bathing Bathing position   Position: Wheelchair/chair at sink  Bathing parts Body parts bathed by patient: Left arm, Chest, Abdomen, Right upper leg, Left upper leg Body parts bathed by helper: Right arm, Front perineal area, Buttocks, Right lower leg, Left lower leg, Back  Bathing assist Assist Level: 2 helpers      Upper Body Dressing/Undressing Upper body dressing   What is the patient wearing?: Hospital gown                Upper body assist        Lower Body Dressing/Undressing Lower body dressing   What is the patient wearing?: Non-skid slipper socks, Hospital Gown                              Lower body assist Assist for lower body dressing:  (+2 to stand in STEADY to don brief)      Toileting Toileting     Toileting steps completed by helper: Adjust clothing prior to toileting, Performs perineal hygiene    Toileting assist Assist level: Two helpers   Transfers  Chair/bed transfer   Chair/bed transfer method: Lateral scoot Chair/bed transfer assist level: 2 helpers Chair/bed transfer assistive device: Sliding board Mechanical lift: Landscape architect Ambulation activity did not occur: Safety/medical concerns         Wheelchair   Type: Manual Max wheelchair distance: 100 Assist Level: Total assistance (Pt < 25%)  Cognition Comprehension Comprehension assist level: Understands basic 75 - 89% of the time/ requires cueing 10 - 24% of the time  Expression Expression assist level: Expresses basic 50 - 74% of the time/requires cueing 25 - 49% of the time. Needs to repeat parts of sentences.  Social Interaction Social Interaction assist level:  Interacts appropriately 90% of the time - Needs monitoring or encouragement for participation or interaction.  Problem Solving Problem solving assist level: Solves basic 75 - 89% of the time/requires cueing 10 - 24% of the time  Memory Memory assist level: Recognizes or recalls 75 - 89% of the time/requires cueing 10 - 24% of the time     Medical Problem List and Plan: 1. Left hemiparesis, cognitive and functional deficitssecondary to right basal ganglia hemorrhage  Cont CIR  Will order resting hand splint, wrist cock up splint, PRAFO  Fluoxetine started 6/19 2. Left peroneal DVT/Anticoagulation: IVC filter placed on 06/16, Lovenox  daily. 3. Pain Management: tylenol prn.  4. Mood: LCSW to follow for evaluation and support.  5. Neuropsych: This patient is not fully capable of making decisions on his own behalf. 6. Skin/Wound Care: Routine pressure relief measures.  7. Fluids/Electrolytes/Nutrition: Monitor intake on dysphagia 1, nectar liquids. Advance diet as tolerated. 8. VDRF s/p trach: Changed to CFS # 6 on 06/14--high risk for OSA. Will need CPAP if decannulated per CCM. Will consider capping trial tomorrow. 9. Acute on chronic CHF: Likely due to hypertonic saline. Check daily weights and montor I/O.  10. Malignant HTN: Monitor BP.  11. Leukocytosis due to MSSA bronchitis v/s PNA and Enterobacter UTI: Resolving with treatment of UTI. Cont to monitor temps and for other signs of infection. Completed rocephin 6/18. 12. ABLA: Stable.   Hb 12.3 on 6/17 13. Enterobacter UTI: Completed rocephin 06/18.   Discontinued foley  Added urispas to help with symptoms, will consider d/cing tomorrow 14. Morbid obesity  Body mass index is 41.72 kg/(m^2).  Diet and exercise education  Cont to encourage weight loss to increase endurance and promote overall health   LOS (Days) 3 A FACE TO FACE EVALUATION WAS PERFORMED  Brad Mcgee 09/04/2015 8:28 AM

## 2015-09-04 NOTE — Progress Notes (Signed)
Occupational Therapy Session Note  Patient Details  Name: Brad Mcgee MRN: [id number] Date of Birth: 01/08/1986  Today's Date: 09/04/2015 OT Individual Time: 1000-1059 OT Individual Time Calculation (min): 59 min    Short Term Goals: Week 1:  OT Short Term Goal 1 (Week 1): Pt will complete toilet transfer with max A OT Short Term Goal 2 (Week 1): Pt will dress UB with mod A OT Short Term Goal 3 (Week 1): Pt will carry over hemi-dressing technique during ADL session with min questioning cues OT Short Term Goal 4 (Week 1): Pt will maintain static sitting balance with mod A in prep for functional task  Skilled Therapeutic Interventions/Progress Updates:    Upon entering the room, pt supine in bed with father present in room. Pt with no c/o pain this session. LB clothing donned while in supine with max A for rolling L <> R for clothing management. Pt performed supine >sit with total A to EOB as pt assist with less than 25% of task. Pt very emotionally labile this session and very tearful throughout. Once seated on EOB, pt needing max - total A for balance. Pt able to thread R UE into pull over shirt only. Use of STEDY for transfer from bed >wheelchair with +2 assist for safety. Pt needing +2 assist to come into standing from bed within STEDY as well. Once in wheelchair, quick release belt donned and OT provided pt with educating regarding OT purpose, POC, and goals with pt in agreement. His father re-entered the room as therapist exiting. Call bell and all needed items within reach.   Therapy Documentation Precautions:  Precautions Precautions: Fall Precaution Comments: watch BP, and O2 saturations on ATC Restrictions Weight Bearing Restrictions: No  See Function Navigator for Current Functional Status.   Therapy/Group: Individual Therapy  Lowella Gripittman, Anner Baity L 09/04/2015, 12:24 PM

## 2015-09-04 NOTE — Progress Notes (Signed)
Trach secure. Pt in no distress 

## 2015-09-04 NOTE — Care Management Note (Signed)
Inpatient Rehabilitation Center Individual Statement of Services  Patient Name:  Brad Mcgee  Date:  09/04/2015  Welcome to the Inpatient Rehabilitation Center.  Our goal is to provide you with an individualized program based on your diagnosis and situation, designed to meet your specific needs.  With this comprehensive rehabilitation program, you will be expected to participate in at least 3 hours of rehabilitation therapies Monday-Friday, with modified therapy programming on the weekends.  Your rehabilitation program will include the following services:  Physical Therapy (PT), Occupational Therapy (OT), Speech Therapy (ST), 24 hour per day rehabilitation nursing, Therapeutic Recreaction (TR), Neuropsychology, Case Management (Social Worker), Rehabilitation Medicine, Nutrition Services and Pharmacy Services  Weekly team conferences will be held on Wednesdays to discuss your progress.  Your Social Worker will talk with you frequently to get your input and to update you on team discussions.  Team conferences with you and your family in attendance may also be held.  Expected length of stay: 3 weeks  Overall anticipated outcome: minimal assistance  Depending on your progress and recovery, your program may change. Your Social Worker will coordinate services and will keep you informed of any changes. Your Social Worker's name and contact numbers are listed  below.  The following services may also be recommended but are not provided by the Inpatient Rehabilitation Center:   Driving Evaluations  Home Health Rehabiltiation Services  Outpatient Rehabilitation Services  Vocational Rehabilitation   Arrangements will be made to provide these services after discharge if needed.  Arrangements include referral to agencies that provide these services.  Your insurance has been [facility] to be[facility]  [facility] Your primary doctor is:  Dr. Elmer RampVirk  Pertinent information will be shared with your doctor and your  insurance company.  Social Worker:  ShorehamLucy Madalena Kesecker, [place] [phone] or ([phone]) [phone]   Information discussed with and copy given to patient by: Amada JupiterHOYLE, Ellison Rieth, 09/04/2015, 1:33 PM

## 2015-09-04 NOTE — IPOC Note (Signed)
Overall Plan of Care [facility]) Patient Details Name: Brad Mcgee MRN: [id number] DOB: Jul 24, 1985  Admitting Diagnosis: ICH  Hospital Problems: Principal Problem:   Right Basal ganglia hemorrhage (HCC) Active Problems:   Accelerated hypertension   Tracheostomy in place [facility])   Deep venous thrombosis (DVT) of left peroneal vein (HCC)   Dysphagia following nontraumatic intracerebral hemorrhage   Morbid obesity (HCC)   Nontraumatic acute hemorrhage of basal ganglia (HCC)   Acute blood loss anemia     Functional Problem List: Nursing Bladder, Bowel, Edema, Endurance, Medication Management, Motor, Nutrition, Pain, Safety, Sensory, Skin Integrity  PT Balance, Endurance, Nutrition, Pain, Motor, Safety, Sensory, Skin Integrity, Edema, Behavior  OT Balance, Cognition, Endurance, Safety, Perception, Motor, Sensory  SLP Cognition, Endurance, Safety  TR         Basic ADL's: OT Eating, Grooming, Bathing, Dressing, Toileting     Advanced  ADL's: OT       Transfers: PT Bed Mobility, Bed to Chair, Car, Furniture, Civil Service fast streamer, Research scientist (life sciences): PT Ambulation, Psychologist, prison and probation services, Stairs     Additional Impairments: OT Fuctional Use of Upper Extremity  SLP Swallowing, Communication      TR      Anticipated Outcomes Item Anticipated Outcome  Self Feeding Set-up  Swallowing  Mod I   Basic self-care  Min A  Toileting  Min A   Bathroom Transfers Min- mod A  Bowel/Bladder  Mod assist  Transfers  CGA  Locomotion  CGA  Communication  Mod I  Cognition  Mod I  Pain  < 4  Safety/Judgment  Min assist   Therapy Plan: PT Intensity: Minimum of 1-2 x/day ,45 to 90 minutes PT Frequency: Total of 15 hours over 7 days of combined therapies PT Duration Estimated Length of Stay: 3 weeks OT Intensity: Minimum of 1-2 x/day, 45 to 90 minutes OT Frequency: 5 out of 7 days OT Duration/Estimated Length of Stay: 3 weeks SLP Intensity: Minumum of 1-2 x/day, 30 to 90  minutes SLP Frequency: 3 to 5 out of 7 days SLP Duration/Estimated Length of Stay: 3 weeks       Team Interventions: Nursing Interventions Patient/Family Education, Bladder Management, Bowel Management, Disease Management/Prevention, Skin Care/Wound Management, Medication Management, Pain Management, Dysphagia/Aspiration Precaution Training, Discharge Planning, Cognitive Remediation/Compensation  PT interventions Ambulation/gait training, DME/adaptive equipment instruction, UE/LE Strength taining/ROM, Balance/vestibular training, Functional electrical stimulation, UE/LE Coordination activities, Cognitive remediation/compensation, Functional mobility training, Visual/perceptual remediation/compensation, Splinting/orthotics, Garment/textile technologist reintegration, Neuromuscular re-education, Museum/gallery curator, Research scientist (life sciences), Discharge planning, Therapeutic Activities, Disease management/prevention, Equities trader education, Therapeutic Exercise  OT Interventions Warden/ranger, Cognitive remediation/compensation, Discharge planning, Community reintegration, Disease mangement/prevention, Fish farm manager, Functional mobility training, Functional electrical stimulation, Neuromuscular re-education, Pain management, Patient/family education, Self Care/advanced ADL retraining, Therapeutic Activities, UE/LE Strength taining/ROM, Therapeutic Exercise, Splinting/orthotics, UE/LE Coordination activities, Wheelchair propulsion/positioning  SLP Interventions Cognitive remediation/compensation, Dysphagia/aspiration precaution training, Patient/family education, Oral motor exercises, Speech/Language facilitation, Therapeutic Activities, Functional tasks, Cueing hierarchy, Therapeutic Exercise  TR Interventions    SW/CM Interventions Discharge Planning, Psychosocial Support, Patient/Family Education    Team Discharge Planning: Destination: PT-Home ,OT- Home , SLP-Home Projected  Follow-up: PT-Home health PT, 24 hour supervision/assistance, OT-  Home health OT, SLP-Outpatient SLP Projected Equipment Needs: PT-To be determined, OT- To be determined, SLP-To be determined Equipment Details: PT-TBD, OT-  Patient/family involved in discharge planning: PT- Patient,  OT-Patient, SLP-Patient, Family member/caregiver  MD ELOS: 18-21 days Medical Rehab Prognosis:  Good Assessment:  30 y.o. male with history of hypertension [facility]  [facility] with acute onset of left sided weakness. CT head done revealing right basal ganglia infarct and he had decline in MS requiring intubation [facility]. Bleed felt to be due to hypertensive emergency and he did have increase in bleed on follow up CT. CTA head and neck negative for stenosis or aneurysm. 2D echo with eF 50-55%, no wall abnormality, grade 2 diastolic dysfunction and no SOE. He was treated with hypertonic saline and required trach 06/7 due to difficulty with vent wean. He was extubated to ATC but continues to have copious secretions. He developed fever 6/11 therefore started on Vanc/Zosyn and blood cultures done negative to date. Urine culture positive for enterobacter and PCCM recommends narrowing antibiotics to rocephin. BLE dopplers done due to edema and revealed LLE peroneal DVT. IVC filter placed on 06/15. He is tolerating PMSV trials and was cleared to start dyshagia 1,nectar liquids as mentation improving. Patient with resulting left hemiparesis as well as cognitive deficits.  Will set goals from an assist with PT and OT and modified with SLP.   See Team Conference Notes for weekly updates to the plan of care

## 2015-09-04 NOTE — Progress Notes (Signed)
Speech Language Pathology Daily Session Note  Patient Details  Name: Brad Mcgee MRN: [id number] Date of Birth: 07/20/1985  Today's Date: 09/04/2015 SLP Individual Time: 1300-1400 SLP Individual Time Calculation (min): 60 min  Short Term Goals: Week 1: SLP Short Term Goal 1 (Week 1): Pt will utilize an increased vocal intensity at the sentence level with Min A verbal cues to achieve 100% intelligibility.  SLP Short Term Goal 2 (Week 1): Pt will perform diaphargmatic breathing exercises with min A verbal cues to maximize breath support for speech intelligibility and cough strength.  SLP Short Term Goal 3 (Week 1): Pt will perform lingual, labial and mandibular resistance exercises to work towards improvoing oral motor strength for advancement of diet with Mod A verbal cues.  SLP Short Term Goal 4 (Week 1): Pt will consume trials of dysphagia 2 diet without s/s of aspiration with Mod A  verbal cues for use of swallowing compensatory strategies.  SLP Short Term Goal 5 (Week 1): Pt will consume trials of ice chips without s/s of aspiration with Min A verbal cues for use of swallowing compensatory strategies.  SLP Short Term Goal 6 (Week 1): Pt will demonstrate functional problem solving for complex tasks with Min A verbal cues.   Skilled Therapeutic Interventions: Skilled treatment session focused on cognitive goals. SLP facilitated session by administering the MoCA (version 7.3). Patient scored 23/30 points with a score of 26 or above considered normal. Patient demonstrates deficits in the areas of recall, organization, executive functioning and orientation. Patient perseverative on his bottom hurting and was constantly repositioning himself in the chair, suspect discomfort impacted his overall function. Patient's PMSV was on upon arrival with all vitals remaining WFL throughout the session. Patient continues to demonstrate a hoarse vocal quality and required Mod A verbal cues for use of an increased  vocal intensity. Patient was labile throughout the session but was easily redirected. Patient consumed sips of nectar-thick liquids without overt s/s of aspiration and was Mod I for use of swallowing compensatory strategies and to manage left anterior spillage of saliva. Patient handed off to NT/RN to perform transfer back to bed [facility] the [facility]. Continue with current plan of care.   Function:   Cognition Comprehension Comprehension assist level: Understands basic 75 - 89% of the time/ requires cueing 10 - 24% of the time  Expression Expression assistive device: Talk trach valve Expression assist level: Expresses basic 50 - 74% of the time/requires cueing 25 - 49% of the time. Needs to repeat parts of sentences.  Social Interaction Social Interaction assist level: Interacts appropriately 90% of the time - Needs monitoring or encouragement for participation or interaction.  Problem Solving Problem solving assist level: Solves basic 75 - 89% of the time/requires cueing 10 - 24% of the time  Memory Memory assist level: Recognizes or recalls 75 - 89% of the time/requires cueing 10 - 24% of the time    Pain Reports pain in his bottom, patient repositioned and transferred back to bed at end of session   Therapy/Group: Individual Therapy  Gabrielle Wakeland 09/04/2015, 3:41 PM

## 2015-09-05 ENCOUNTER — Inpatient Hospital Stay (HOSPITAL_COMMUNITY): Payer: 59 | Admitting: *Deleted

## 2015-09-05 ENCOUNTER — Inpatient Hospital Stay (HOSPITAL_COMMUNITY): Payer: 59 | Admitting: Speech Pathology

## 2015-09-05 ENCOUNTER — Inpatient Hospital Stay (HOSPITAL_COMMUNITY): Payer: 59 | Admitting: Physical Therapy

## 2015-09-05 ENCOUNTER — Inpatient Hospital Stay (HOSPITAL_COMMUNITY): Payer: 59 | Admitting: Occupational Therapy

## 2015-09-05 DIAGNOSIS — G4733 Obstructive sleep apnea (adult) (pediatric): Secondary | ICD-10-CM | POA: Insufficient documentation

## 2015-09-05 DIAGNOSIS — Z430 Encounter for attention to tracheostomy: Secondary | ICD-10-CM

## 2015-09-05 DIAGNOSIS — E662 Morbid (severe) obesity with alveolar hypoventilation: Secondary | ICD-10-CM | POA: Insufficient documentation

## 2015-09-05 LAB — CBC
HCT: 34.9 % — ABNORMAL LOW (ref 39.0–52.0)
Hemoglobin: 11.8 g/dL — ABNORMAL LOW (ref 13.0–17.0)
MCH: 31.5 pg (ref 26.0–34.0)
MCHC: 33.8 g/dL (ref 30.0–36.0)
MCV: 93.1 fL (ref 78.0–100.0)
Platelets: 377 10*3/uL (ref 150–400)
RBC: 3.75 MIL/uL — ABNORMAL LOW (ref 4.22–5.81)
RDW: 13 % (ref 11.5–15.5)
WBC: 9 10*3/uL (ref 4.0–10.5)

## 2015-09-05 NOTE — Plan of Care (Signed)
Problem: RH BLADDER ELIMINATION Goal: RH STG MANAGE BLADDER WITH ASSISTANCE STG Manage Bladder With mod Assistance  Outcome: Not Progressing Pt attempt [facility]inal[facility], unsuccessful attempt although pt feeling urge to void.

## 2015-09-05 NOTE — Consult Note (Signed)
Name: Brad Mcgee MRN: [id number] DOB: 1985/09/24    ADMISSION DATE:  09/01/2015 CONSULTATION DATE:  6/20  REFERRING MD :  Rehab  CHIEF COMPLAINT:  Trach dependent  BRIEF PATIENT DESCRIPTION: Obese M+BM in no distress  SIGNIFICANT EVENTS  5/30 admit to [facility] from [facility] ICH left hemiplegia. Intubated. 5/31 3% saline 5/31 intubated 6/7 Trach per JY 6/14 #6 cuffless trach 6/16 dc [facility]    STUDIES:    HISTORY OF PRESENT ILLNESS:  [name] who was admitted to [facility]  5/30 with ICH in setting of hypertension, suspected untreated or diagnosed OSA, who required intubation till 6/7 at which time a tracheostomy was place per Dr. Molli Knock to facilitate his liberation from [facility]. He has been downsized to a #6 cuffless trach but  Not a #4 due to secretions. He has lost weight since admission on 5/30 from 294 lbs to 273 lbs. He has left hemiplegia but is progressing well in his rehab setting. PCCM asked to evaluate for possible decannulation. We may consider capping his trach at night and place him on cpap to see how he tolerates CPAP. If he tolerates CPAP then we could safely de cannulate him.  PAST MEDICAL HISTORY :   has a past medical history of Hypertension and Fracture of left ankle.  has no past surgical history on file. Prior to Admission medications   Medication Sig Start Date End Date Taking? Authorizing Provider  lisinopril (PRINIVIL,ZESTRIL) 10 MG tablet Take 10 mg by mouth daily.   Yes Historical Provider, MD  naproxen (NAPROSYN) 500 MG tablet Take 1 tablet (500 mg total) by mouth 2 (two) times daily with a meal. 08/01/15  Yes Lutricia Feil, PA-C   Allergies  Allergen Reactions  . Bee Venom Anaphylaxis  . Coconut Oil Anaphylaxis  . Ancef [Cefazolin] Rash    FAMILY HISTORY:  family history includes Hypertension in his mother. SOCIAL HISTORY:  reports that he has never smoked. He has never used smokeless tobacco. He reports that he  does not drink alcohol or use illicit drugs.  REVIEW OF SYSTEMS:   10 point review of system taken, please see HPI for positives and negatives. .  SUBJECTIVE: NAD at rest  VITAL SIGNS: Temp:  [98.1 F (36.7 C)-98.2 F (36.8 C)] 98.1 F (36.7 C) (06/20 0543) Pulse Rate:  [79-99] 82 (06/20 0907) Resp:  [16-18] 18 (06/20 0907) BP: (106-112)/(65-89) 112/65 mmHg (06/20 0543) SpO2:  [96 %-100 %] 99 % (06/20 0907) FiO2 (%):  [21 %] 21 % (06/20 0907) Weight:  [273 lb 9.6 oz (124.104 kg)] 273 lb 9.6 oz (124.104 kg) (06/20 0543)  PHYSICAL EXAMINATION: General:  Obese AAM who is very labile emotionally. Neuro:  Left hemiplegia, speech clear via PMV HEENT:  #6 cuffless trach in palce Cardiovascular:  HSR RRR Lungs:  CTA Abdomen:  Obese, +bs Musculoskeletal: Intact Skin: warm and dry   Recent Labs Lab 08/30/15 0556 09/02/15 0340  NA 143 134*  K 4.1 3.9  CL 113* 101  CO2 24 25  BUN 42* 27*  CREATININE 1.12 1.01  GLUCOSE 136* 114*    Recent Labs Lab 08/30/15 0556 09/02/15 0340 09/05/15 0549  HGB 11.3* 12.6* 11.8*  HCT 36.7* 37.5* 34.9*  WBC 14.0* 12.3* 9.0  PLT 264 360 377   No results found.  ASSESSMENT  Principal Problem:   Right Basal ganglia hemorrhage (HCC) Active Problems:    OSA (obstructive sleep apnea) suspected will  need cpap     when decannulated   Accelerated hypertension   Tracheostomy in place [facility](HCC)   Deep venous thrombosis (DVT) of left peroneal vein (HCC)   Dysphagia following nontraumatic intracerebral hemorrhage   Morbid obesity (HCC)   Nontraumatic acute hemorrhage of basal ganglia (HCC)   Acute blood loss anemia   Discussion:  [name] who [facility] with ICH in setting of hypertension, suspected untreated or diagnosed OSA, who required intubation till 6/7 at which time a tracheostomy was place per Dr. Molli KnockYacoub [facility]. He has been downsized to a #6 cuffless trach but  Not a #4 due to  secretions. He has lost weight since admission on 5/30 from 294 lbs to 273 lbs. He has left hemiplegia but is progressing well in his rehab setting. PCCM asked to evaluate for possible decannulation. We may consider capping his trach at night and place him on cpap to see how he tolerates CPAP. If he tolerates CPAP then we could safely de cannulate him.  PLAN: Capped trach at night. Nocturnal CPAP with auto set cpap If tolerates CPAP then de cannulate . He should tolerate #6 trach without downsizing to #4.  Brett CanalesSteve Minor ACNP [facility] PCCM Pager [phone] till 3 pm If no answer page [phone] 09/05/2015, 11:48 AM   ATTENDING NOTE / ATTESTATION NOTE :   I have discussed the case with the resident/APP Brett CanalesSteve Minor.  I agree with the resident/APP's  history, physical examination, assessment, and plans.  I have edited the above note and modified it according to our agreed history, physical examination, assessment and plan.   Pt was admitted as a CVA. Ended up being on the vent and had a tracheostomy. He has clinically improved and is anticipating going home.  Has lost weight as well.  Pt denies any dx of OSA. Has snoring and crowded airway. Much as I want to keep the trache for possible OSA,  Pt prefers to have the trache removed.  I understand where he is coming from.  Noted plans to cap trache overnight and try pt on cpap if he will tolerate it.  I will suggest to decannulate pt if he can be capped overnight (regardless if he tolerates cpap or not).  He will need a sleep study as an outpt once trache stoma is healed.    Family :Family updated at length today.     Pollie MeyerJ. Angelo A de Dios, MD 09/05/2015, 3:22 PM [facility] Pager [phone] After 3 pm or if no answer, call [phone]

## 2015-09-05 NOTE — Progress Notes (Signed)
Occupational Therapy Session Note  Patient Details  Name: Brad Mcgee MRN: [id number] Date of Birth: 11/25/1985  Today's Date: 09/05/2015 OT Individual Time: 1345-1453 OT Individual Time Calculation (min): 68 min    Short Term Goals: Week 1:  OT Short Term Goal 1 (Week 1): Pt will complete toilet transfer with max A OT Short Term Goal 2 (Week 1): Pt will dress UB with mod A OT Short Term Goal 3 (Week 1): Pt will carry over hemi-dressing technique during ADL session with min questioning cues OT Short Term Goal 4 (Week 1): Pt will maintain static sitting balance with mod A in prep for functional task  Skilled Therapeutic Interventions/Progress Updates:    Upon entering the room, pt seated in wheelchair awaiting therapist with family present in the room. Pt with no c/o pain this session and agreeable to OT intervention. Pt requesting toileting this session. Pt transferred from wheelchair >drop arm commode chair with +2 assist for squat pivot for safety. Lateral leans to remove clothing with total A +2. Pt standing within STEDY for LB clothing management with pt displaying strong L lean in standing. Pt seated on STEDY and positioned in front of sink where he stood with mirror to provide visual feedback. Max A of 1 to stand from elevated STEDY seat. Manual facilitation for trunk and upright standing provided and pt able to stand for 5 minutes. Pt requesting to return to bed at this time with STEDY. Max A for sit >supine. Once in bed and head elevated, OT educated and demonstrated use how to utilize R UE for self ROM of L UE in all planes of movement for shoulder, elbow, wrist, and digits. Pt returning demonstrations with mod verbal cues for proper technique. Pt returned demonstrations x 10 reps for each movement. OT performed hand hygiene as well and pt remained in bed at end of session. Bed alarm activated and call bell within reach upon exiting the room.   Therapy Documentation Precautions:   Precautions Precautions: Fall Precaution Comments: watch BP, and O2 saturations on ATC Restrictions Weight Bearing Restrictions: No Vital Signs: Therapy Vitals Pulse Rate: 73 Resp: 18 BP: 118/66 mmHg Patient Position (if appropriate): Lying Oxygen Therapy SpO2: 96 % O2 Device: Not Delivered  See Function Navigator for Current Functional Status.   Therapy/Group: Individual Therapy  Lowella Gripittman, [name] 09/05/2015, 4:54 PM

## 2015-09-05 NOTE — Progress Notes (Signed)
RT NOTE:  Pt started on nocturnal CPAP auto titrate 15/5 w/ FFM. No o2 bled in, humidity chamber filled. Pt has #6 Shiley CFLS trach that was capped starting today. MD order stated to begin tonight auto titration w/ capped trach. Pt very positive about CPAP therapy, only concern for him is needing to cough and clear secretions, RT showed patient how to remove mask and encourage him to call RN/RT if he needed assistance with anything. Pt stated pressure was comfortable and pt fell asleep before RT left room. RN is aware that patient is on CPAP. RT will monitor.

## 2015-09-05 NOTE — Progress Notes (Signed)
RT capped patient with capped inner cannula per MD order. Patient tolerated well. RT will continue to monitor.

## 2015-09-05 NOTE — Progress Notes (Signed)
[place] PHYSICAL MEDICINE & REHABILITATION     PROGRESS NOTE  Subjective/Complaints:  Pt seen laying in bed.  Mother at bedside.  Pt with braces in places.   ROS: Denies CP, SOB, N/V/D.  Objective: Vital Signs: Blood pressure 112/65, pulse 80, temperature 98.1 F (36.7 C), temperature source Oral, resp. rate 17, height 5\' 8"  (1.727 m), weight 124.104 kg (273 lb 9.6 oz), SpO2 100 %. No results found.  Recent Labs  09/05/15 0549  WBC 9.0  HGB 11.8*  HCT 34.9*  PLT 377   No results for input(s): NA, K, CL, GLUCOSE, BUN, CREATININE, CALCIUM in the last 72 hours.  Invalid input(s): CO CBG (last 3)  No results for input(s): GLUCAP in the last 72 hours.  Wt Readings from Last 3 Encounters:  09/05/15 124.104 kg (273 lb 9.6 oz)  09/01/15 126.3 kg (278 lb 7.1 oz)  08/01/15 135.172 kg (298 lb)    Physical Exam:  BP 112/65 mmHg  Pulse 80  Temp(Src) 98.1 F (36.7 C) (Oral)  Resp 17  Ht 5\' 8"  (1.727 m)  Wt 124.104 kg (273 lb 9.6 oz)  BMI 41.61 kg/m2  SpO2 100% Constitutional: He appears well-developed and well-nourished. No distress.  Obese HENT: Normocephalic and atraumatic.  Eyes: Conjunctivae and EOM are normal.   Neck: #6 Cuffless trach.  Cardiovascular: Regular rate and rhythm.   Respiratory: Effort normal. No accessory muscle usage or stridor. No respiratory distress.   GI: Soft. Bowel sounds are normal. He exhibits no distension. There is no tenderness.  Musculoskeletal: He exhibits no edema or tenderness.  Neurological: He is alert and oriented x3.  Sensation intact to light touch.  Motor: RUE/RLE: 5/5 proximal to distal LUE: 0/5 proximal to distal LLE: 2/5 proximal to distal No increased tone noted.  Skin: Skin is warm and dry. He is not diaphoretic.  Psychiatric: His mood and behavior appear normal.    Assessment/Plan: 1. Functional deficits secondary to right basal ganglia hemorrhage which require 3+ hours per day of interdisciplinary therapy in a  comprehensive inpatient rehab setting. Physiatrist is providing close team supervision and 24 hour management of active medical problems listed below. Physiatrist and rehab team continue to assess barriers to discharge/monitor patient progress toward functional and medical goals.  Function:  Bathing Bathing position   Position: Wheelchair/chair at sink  Bathing parts Body parts bathed by patient: Left arm, Chest, Abdomen, Right upper leg, Left upper leg Body parts bathed by helper: Right arm, Front perineal area, Buttocks, Right lower leg, Left lower leg, Back  Bathing assist Assist Level: 2 helpers      Upper Body Dressing/Undressing Upper body dressing   What is the patient wearing?: Pull over shirt/dress     Pull over shirt/dress - Perfomed by patient: Thread/unthread right sleeve Pull over shirt/dress - Perfomed by helper: Thread/unthread left sleeve, Put head through opening, Pull shirt over trunk        Upper body assist Assist Level:  (max A)      Lower Body Dressing/Undressing Lower body dressing   What is the patient wearing?: Non-skid slipper socks, Pants     Pants- Performed by patient: Thread/unthread right pants leg Pants- Performed by helper: Thread/unthread left pants leg, Pull pants up/down   Non-skid slipper socks- Performed by helper: Don/doff right sock, Don/doff left sock                  Lower body assist Assist for lower body dressing:  (total A)  Toileting Toileting     Toileting steps completed by helper: Adjust clothing prior to toileting, Performs perineal hygiene    Toileting assist Assist level: Two helpers   Transfers Chair/bed transfer   Chair/bed transfer method: Lateral scoot Chair/bed transfer assist level: 2 helpers Chair/bed transfer assistive device: Sliding board Mechanical lift: Landscape architect Ambulation activity did not occur: Safety/medical concerns         Wheelchair   Type: Manual Max  wheelchair distance: 100 Assist Level: Total assistance (Pt < 25%)  Cognition Comprehension Comprehension assist level: Understands basic 75 - 89% of the time/ requires cueing 10 - 24% of the time  Expression Expression assist level: Expresses basic 50 - 74% of the time/requires cueing 25 - 49% of the time. Needs to repeat parts of sentences.  Social Interaction Social Interaction assist level: Interacts appropriately 90% of the time - Needs monitoring or encouragement for participation or interaction.  Problem Solving Problem solving assist level: Solves basic 75 - 89% of the time/requires cueing 10 - 24% of the time  Memory Memory assist level: Recognizes or recalls 75 - 89% of the time/requires cueing 10 - 24% of the time     Medical Problem List and Plan: 1. Left hemiparesis, cognitive and functional deficitssecondary to right basal ganglia hemorrhage  Cont CIR  Will order resting hand splint, wrist cock up splint, PRAFO  Fluoxetine started 6/19 2. Left peroneal DVT/Anticoagulation: IVC filter placed on 06/16, Lovenox  daily. 3. Pain Management: tylenol prn.  4. Mood: LCSW to follow for evaluation and support.  5. Neuropsych: This patient is not fully capable of making decisions on his own behalf. 6. Skin/Wound Care: Routine pressure relief measures.  7. Fluids/Electrolytes/Nutrition: Monitor intake on dysphagia 1, nectar liquids. Advance diet as tolerated. 8. VDRF s/p trach: Changed to CFS # 6 on 06/14--high risk for OSA. Will need CPAP if decannulated per CCM. Per notes, it appears pt to be downsized this week.   9. Acute on chronic CHF: Likely due to hypertonic saline. Check daily weights and montor I/O.  10. Malignant HTN: Monitor BP.  11. Leukocytosis due to MSSA bronchitis v/s PNA and Enterobacter [id number]  Resolving with treatment of UTI.    Cont to monitor temps and for other signs of infection. Completed rocephin 6/18. 12. ABLA: Stable.   Hb 11.8 on  6/20 13. Enterobacter UTI: Completed rocephin 06/18.   Discontinued foley 14. Morbid obesity  Body mass index is 41.61 kg/(m^2).  Diet and exercise education  Cont to encourage weight loss to increase endurance and promote overall health   LOS (Days) 4 A FACE TO FACE EVALUATION WAS PERFORMED  Ankit Karis Juba 09/05/2015 8:18 AM

## 2015-09-05 NOTE — Progress Notes (Signed)
Physical Therapy Session Note  Patient Details  Name: Brad Mcgee MRN: [id number] Date of Birth: August 19, 1985  Today's Date: 09/05/2015 PT Individual Time: 1003-1100 PT Individual Time Calculation (min): 57 min   Short Term Goals: Week 1:  PT Short Term Goal 1 (Week 1): Pt with be CGA for bed mobility PT Short Term Goal 2 (Week 1): Pt will be CGA for transfers PT Short Term Goal 3 (Week 1): Pt will initiate gait activities  Skilled Therapeutic Interventions/Progress Updates:    Pt received sleeping in bed, takes increased time to arouse, but agreeable to therapy with no c/o pain.  Session focus on functional mobility, activity tolerance, positioning, and patient education.  LB dressing at bed level with max assist to roll, total assist to don pants and non-skid socks.  Supine>sit with mod assist to maneuver LLE off EOB and to elevate trunk (PT less than 50% elevating trunk).  Pt requires mod>total assist to maintain sitting balance EOB.  Sit<>stand from EOB with STEDY and +2 assist for safety.  Pt able to tolerate supported standing in STEDY x11 minutes with 2 episodes of dizziness that pt reports improved with forward trunk flexion.  PT provided mod>total assist for patient to maintain midline posture in STEDY.  Transfer to TIS w/c and pt positioned in neutral posture in w/c.  Added padding to leg rests to prevent deep tissue injury and provided pt and family education regarding tilt feature to prevent pressure on bottom when sitting for long periods of time.  All verbalized understanding.    Pt left tilted in w/c with call bell in reach and needs met.  Therapy Documentation Precautions:  Precautions Precautions: Fall Precaution Comments: watch BP, and O2 saturations on ATC Restrictions Weight Bearing Restrictions: No   See Function Navigator for Current Functional Status.   Therapy/Group: Individual Therapy  Javona Bergevin E Penven-Crew 09/05/2015, 11:03 AM

## 2015-09-05 NOTE — Progress Notes (Signed)
Recreational Therapy Assessment and Plan  Patient Details  Name: Brad Mcgee MRN: [id number] Date of Birth: April 27, 1985 Today's Date: 09/05/2015  Rehab Potential: Good ELOS: 3 weeks   Assessment Clinical Impression:  Problem List:  Patient Active Problem List   Diagnosis Date Noted  . Deep venous thrombosis (DVT) of left peroneal vein ([place]) 09/01/2015  . Dysphagia following nontraumatic intracerebral hemorrhage 09/01/2015  . Morbid obesity ([place]) 09/01/2015  . Nontraumatic acute hemorrhage of basal ganglia ([place]) 09/01/2015  . Tracheostomy in place [facility])   . Leukocytosis   . Fever, unspecified   . Right Basal ganglia hemorrhage ([place])   . Acute respiratory failure ([place])   . Acute respiratory failure with hypoxemia ([place])   . Cytotoxic brain edema ([place]) 08/18/2015  . Brain herniation ([place]) 08/18/2015  . Accelerated hypertension 08/16/2015    Past Medical History:  Past Medical History  Diagnosis Date  . Hypertension   . Fracture of left ankle     treated with cast   Past Surgical History: No past surgical history on file.  Assessment & Plan Clinical Impression: Brad Mcgee is a 30 y.o. male with history of hypertension was admitted to [facility] on 08/15/15 with acute onset of left sided weakness. CT head done revealing right basal ganglia infarct and he had decline in MS requiring intubation and transfer to [facility]. Bleed felt to be due to hypertensive emergency and he did have increase in bleed on follow up CT. CTA head and neck negative for stenosis or aneurysm. 2D echo with eF 50-55%, no wall abnormality, grade 2 diastolic dysfunction and no SOE. He was treated with hypertonic saline and required trach 06/7 due to difficulty with vent wean. He was extubated to ATC but continues to have copious secretions. He developed fever 6/11 therefore started on Vanc/Zosyn and blood cultures done negative to date. Urine culture positive for  enterobacter and PCCM recommends narrowing antibiotics to rocephin. BLE dopplers done due to edema and revealed LLE peroneal DVT. IVC filter placed on 06/15. He is tolerating PMSV trials and was cleared to start dyshagia 1,nectar liquids as mentation improving. Patient transferred to CIR on 09/01/2015.      Pt presents with decreased activity tolerance, decreased functional mobility, decreased balance, decreased coordination, decreased initiation, decreased attention, decreased awareness, decreased problem solving, decreased safety awareness and delayed processing Limiting pt's independence with leisure/community pursuits.   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) Leisure Participation Style: With Family/Friends Awareness of Community Resources: Good-identify 3 post discharge leisure resources Psychosocial / Spiritual Social interaction - Mood/Behavior: Cooperative Recreational Therapy Orientation Orientation -Reviewed with patient: Available activity resources Strengths/Weaknesses Patient Strengths/Abilities: Willingness to participate;Active premorbidly Patient weaknesses: Physical limitations TR Patient demonstrates impairments in the following area(s): Edema;Endurance;Motor;Safety;Skin Integrity  Plan Rec Therapy Plan Is patient appropriate for Therapeutic Recreation?: Yes Rehab Potential: Good Treatment times per week: Min 1 time per week >20 minutes Estimated Length of Stay: 3 weeks TR Treatment/Interventions: Adaptive equipment instruction;1:1 session;Balance/vestibular training;Functional mobility training;Community reintegration;Cognitive remediation/compensation;Patient/family education;Therapeutic activities;Recreation/leisure participation;Therapeutic exercise;UE/LE Coordination activities;Wheelchair propulsion/positioning Recommendations for other services: Neuropsych  Recommendations  for other services: Neuropsych  Discharge Criteria: Patient will be discharged from TR if patient refuses treatment 3 consecutive times without medical reason.  If treatment goals not met, if there is a change in medical status, if patient makes no progress towards goals or if patient is discharged from hospital.  The above assessment, treatment plan, treatment alternatives and goals were discussed and mutually  agreed upon: by patient  [place] 09/05/2015, 1:23 PM

## 2015-09-05 NOTE — Progress Notes (Signed)
Physical Therapy Session Note  Patient Details  Name: Brad Mcgee MRN: [id number] Date of Birth: [birth year]  Today's Date: 09/05/2015 PT Individual Time: 1300-1340 PT Individual Time Calculation (min): 40 min   Short Term Goals: Week 1:  PT Short Term Goal 1 (Week 1): Pt with be CGA for bed mobility PT Short Term Goal 2 (Week 1): Pt will be CGA for transfers PT Short Term Goal 3 (Week 1): Pt will initiate gait activities  Skilled Therapeutic Interventions/Progress Updates:    Pt received resting in TIS w/c, reporting no pain and that he tolerated sitting in w/c well.  Session focus on standing tolerance, weight shift, weight bearing through RUE/RLE, and transfers.    NMR in standing frame for standing tolerance, weight bearing, and encouraged weight shift to R as well as trunk elongation/shortening while reaching for cups/rings outside BOS to R.  Pt able to tolerate 2 trials of standing 3-4 minutes in standing frame each trial with no c/o dizziness.    Squat/pivot w/c<>therapy mat to R both times focus on forward weight shift, head/hips relationship, and weight bearing through RLE to lift bottom.  Pt continues to require +2 for transfer but demonstrates functional strength in LEs, and good forward weight shift for transfers.    Pt returned to room at end of session and awaiting OT in w/c.  Call bell in reach and needs met.   Therapy Documentation Precautions:  Precautions Precautions: Fall Precaution Comments: watch BP, and O2 saturations on ATC Restrictions Weight Bearing Restrictions: No   See Function Navigator for Current Functional Status.   Therapy/Group: Individual Therapy  [name] 09/05/2015, 1:52 PM

## 2015-09-05 NOTE — Progress Notes (Signed)
Speech Language Pathology Daily Session Note  Patient Details  Name: Brad Mcgee MRN: [id number] Date of Birth: 07/29/1985  Today's Date: 09/05/2015 SLP Individual Time: 1100-1155 SLP Individual Time Calculation (min): 55 min  Short Term Goals: Week 1: SLP Short Term Goal 1 (Week 1): Pt will utilize an increased vocal intensity at the sentence level with Min A verbal cues to achieve 100% intelligibility.  SLP Short Term Goal 2 (Week 1): Pt will perform diaphargmatic breathing exercises with min A verbal cues to maximize breath support for speech intelligibility and cough strength.  SLP Short Term Goal 3 (Week 1): Pt will perform lingual, labial and mandibular resistance exercises to work towards improvoing oral motor strength for advancement of diet with Mod A verbal cues.  SLP Short Term Goal 4 (Week 1): Pt will consume trials of dysphagia 2 diet without s/s of aspiration with Mod A  verbal cues for use of swallowing compensatory strategies.  SLP Short Term Goal 5 (Week 1): Pt will consume trials of ice chips without s/s of aspiration with Min A verbal cues for use of swallowing compensatory strategies.  SLP Short Term Goal 6 (Week 1): Pt will demonstrate functional problem solving for complex tasks with Min A verbal cues.   Skilled Therapeutic Interventions: Skilled treatment session focused on cognitive and dysphagia goals. Upon arrival, the PMSV was in place. Patient tolerated valve throughout session with all vitals remaining WFL. SLP facilitated session by providing extra time and supervision verbal cues for basic problem solving with a money management task. SLP also facilitated session by opening containers and providing skilled observation with his current meal of Dys. 1 textures with nectar-thick liquids. Patient consumed meal without overt s/s of aspiration with minimal anterior spillage and required Min A verbal cues for use of small bites and a slow pace of self-feeding. Patient  continues to be labile throughout session, especially with visitors and family members. Patient left sitting upright in wheelchair with family present. Continue with current plan of care.    Function:  Eating Eating Eating activity did not occur: N/A Modified Consistency Diet: Yes Eating Assist Level: Supervision or verbal cues;Helper checks for pocketed food;Helper scoops food on utensil     Helper Scoops Food on Utensil: Occasionally     Cognition Comprehension Comprehension assist level: Understands basic 75 - 89% of the time/ requires cueing 10 - 24% of the time  Expression Expression assistive device: Talk trach valve Expression assist level: Expresses basic 50 - 74% of the time/requires cueing 25 - 49% of the time. Needs to repeat parts of sentences.  Social Interaction Social Interaction assist level: Interacts appropriately 90% of the time - Needs monitoring or encouragement for participation or interaction.  Problem Solving Problem solving assist level: Solves basic 75 - 89% of the time/requires cueing 10 - 24% of the time  Memory Memory assist level: Recognizes or recalls 75 - 89% of the time/requires cueing 10 - 24% of the time    Pain Reports discomfort while sitting upright, patient repositioned and reclined in chair   Therapy/Group: Individual Therapy  Arjay Jaskiewicz 09/05/2015, 4:18 PM

## 2015-09-06 ENCOUNTER — Inpatient Hospital Stay (HOSPITAL_COMMUNITY): Payer: 59 | Admitting: Occupational Therapy

## 2015-09-06 ENCOUNTER — Inpatient Hospital Stay (HOSPITAL_COMMUNITY): Payer: 59 | Admitting: Physical Therapy

## 2015-09-06 ENCOUNTER — Inpatient Hospital Stay (HOSPITAL_COMMUNITY): Payer: 59 | Admitting: Speech Pathology

## 2015-09-06 DIAGNOSIS — E662 Morbid (severe) obesity with alveolar hypoventilation: Secondary | ICD-10-CM

## 2015-09-06 NOTE — Progress Notes (Addendum)
Pt dencannulated per MD order.  PT tol well, no distress noted.  Dressing was applied.  RN aware.  Sat 100% on room air, pt able to speak.

## 2015-09-06 NOTE — Progress Notes (Signed)
Name: [name] M Monts MRN: [id number] DOB: 07/13/1985    ADMISSION DATE:  09/01/2015 CONSULTATION DATE:  6/20  REFERRING MD :  Rehab  CHIEF COMPLAINT:  Trach dependent  BRIEF PATIENT DESCRIPTION: Obese M+BM in no distress  SIGNIFICANT EVENTS  [facility]leg[facility]. Intubated. 5/31 3% saline 5/31 intubated 6/7 Trach per JY 6/14 #6 cuffless trach 6/16 dc [facility] 6/21 decannulation ordered    STUDIES:    HISTORY OF PRESENT ILLNESS:  [name] who [facility]30 with ICH in setting of hypertension, suspected untreated or diagnosed OSA, who required intubation till 6/7 at which time a tracheostomy was place per Dr. Molli KnockYacoub to [facility]. He has been downsized to a #6 cuffless trach but  Not a #4 due to secretions. He has lost weight since admission on 5/30 from 294 lbs to 273 lbs. He has left hemiplegia but is progressing well in his rehab setting. PCCM asked to evaluate for possible decannulation. We may consider capping his trach at night and place him on cpap to see how he tolerates CPAP. If he tolerates CPAP then we could safely de cannulate him.    SUBJECTIVE: NAD at rest. Tolerated capped trach and cpap overnight 6/21  VITAL SIGNS: Temp:  [98.7 F (37.1 C)] 98.7 F (37.1 C) (06/21 0548) Pulse Rate:  [73-91] 86 (06/21 0745) Resp:  [16-18] 16 (06/21 0745) BP: (102-118)/(66-69) 102/69 mmHg (06/21 0548) SpO2:  [96 %-100 %] 100 % (06/21 0745) FiO2 (%):  [21 %] 21 % (06/21 0745) Weight:  [298 lb (135.172 kg)] 298 lb (135.172 kg) (06/21 0548)  PHYSICAL EXAMINATION: General:  Obese AAM who is very labile emotionally. Neuro:  Left hemiplegia, speech clear via capped trach, capped HEENT:  #6 cuffless trach in palce Cardiovascular:  HSR RRR Lungs:  CTA Abdomen:  Obese, +bs Musculoskeletal: Intact Skin: warm and dry   Recent Labs Lab 09/02/15 0340  NA 134*  K 3.9  CL  101  CO2 25  BUN 27*  CREATININE 1.01  GLUCOSE 114*    Recent Labs Lab 09/02/15 0340 09/05/15 0549  HGB 12.6* 11.8*  HCT 37.5* 34.9*  WBC 12.3* 9.0  PLT 360 377   No results found.  ASSESSMENT  Principal Problem:   Right Basal ganglia hemorrhage (HCC) Active Problems:    OSA (obstructive sleep apnea) suspected will need cpap     when decannulated   Accelerated hypertension   Tracheostomy in place [facility](HCC)   Deep venous thrombosis (DVT) of left peroneal vein (HCC)   Dysphagia following nontraumatic intracerebral hemorrhage   Morbid obesity (HCC)   Nontraumatic acute hemorrhage of basal ganglia (HCC)   Acute blood loss anemia   Discussion:  [name] who [facility] with ICH in setting of hypertension, suspected untreated or diagnosed OSA, who required intubation till 6/7 at which time a tracheostomy was place per Dr. Molli KnockYacoub [facility]. He has been downsized to a #6 cuffless trach but  Not a #4 due to secretions. He has lost weight since admission on 5/30 from 294 lbs to 273 lbs. He has left hemiplegia but is progressing well in his rehab setting. PCCM asked to evaluate for possible decannulation. We may consider capping his trach at night and place him on cpap to see how he tolerates CPAP. If he tolerates CPAP then  we could safely de cannulate him. 6/21 tolerated cpap and capped trach.  PLAN: Capped trach at night. Nocturnal CPAP with auto set cpap. WHICH WENT WELL De cannulate. 6/21 Continue nocturnal cpap Will need sleep study as opt.  He can follow up with one of our sleep MD's   Brett Canales Minor ACNP Adolph Pollack PCCM Pager [phone] till 3 pm If no answer page [phone] 09/06/2015, 10:01 AM  STAFF NOTE: I, Rory Percy, MD FACP have personally reviewed patient's available data, including medical history, events of note, physical examination and test results as part of my evaluation. I have discussed with resident/NP and other  care providers such as pharmacist, RN and RRT. In addition, I personally evaluated patient and elicited key findings of: awake in chair, trach has been removed after cap x 24 hrs, he was eating and speaking well all day with PMV, strong cough, agree for decanualtion, i counselled himand family on importance for NIMV noctrunal and sleep study as outpt, will sign off, call if needed   Mcarthur Rossetti. Tyson Alias, MD, FACP Pgr: [phone] [facility] 09/06/2015 1:51 PM

## 2015-09-06 NOTE — Progress Notes (Signed)
Social Work Patient ID: Brad Mcgee, male   DOB: 10/27/1985, 30 y.o.   MRN: [id number]   Met with pt and father today to review team conference.  Both aware and agreeable with targeted d/c date of 7/12 with min assist w/c level goals.  Both optimistic that they will surpass those goals and "you'll be walking out of here..."  Family is extremely supportive and involved and pt responds to this encouragement.  Continue to follow.  Jazmon Kos, LCSW

## 2015-09-06 NOTE — Progress Notes (Signed)
Occupational Therapy Session Note  Patient Details  Name: Brad Mcgee MRN: [id number] Date of Birth: 12/10/1985  Today's Date: 09/06/2015 OT Individual Time: 1100-1200 OT Individual Time Calculation (min): 60 min    Short Term Goals: Week 1:  OT Short Term Goal 1 (Week 1): Pt will complete toilet transfer with max A OT Short Term Goal 2 (Week 1): Pt will dress UB with mod A OT Short Term Goal 3 (Week 1): Pt will carry over hemi-dressing technique during ADL session with min questioning cues OT Short Term Goal 4 (Week 1): Pt will maintain static sitting balance with mod A in prep for functional task  Skilled Therapeutic Interventions/Progress Updates:  Upon entering the room, pt seated in wheelchair and agreeable to bathing from sink side this session with focus on hemiplegic dressing techniques, sit <>stand, weight shifting, and midline orientation. Pt required hand over hand assist in order to incorporate L UE into functional tasks. Pt required max A to stand from wheelchair into STEDY. Pt standing for 6 minutes while washing buttocks and peri area with total A for balance and safety. Pt returned to seated position secondary to fatigue. Pt standing again with total A of 2 for clothing management and safety. Pt requesting to return to bed secondary to fatigue . STEDY utilized to return to bed. Pt sat on EOB with max - total A for sitting balance for UB dressing. Pt performed sit >supine with max A.  Day time use brace placed on L UE with total A from therapist. Bed alarm activated with call bell and all needed items within reach upon exiting the room.   Therapy Documentation Precautions:  Precautions Precautions: Fall Precaution Comments: watch BP, and O2 saturations on ATC Restrictions Weight Bearing Restrictions: No  See Function Navigator for Current Functional Status.   Therapy/Group: Individual Therapy  Lowella Gripittman, [name] L 09/06/2015, 12:46 PM

## 2015-09-06 NOTE — Progress Notes (Signed)
Physical Therapy Session Note  Patient Details  Name: Brad Mcgee MRN: [id number] Date of Birth: 04/26/1985  Today's Date: 09/06/2015 PT Individual Time: 1300-1400 PT Individual Time Calculation (min): 60 min   Short Term Goals: Week 1:  PT Short Term Goal 1 (Week 1): Pt with be CGA for bed mobility PT Short Term Goal 2 (Week 1): Pt will be CGA for transfers PT Short Term Goal 3 (Week 1): Pt will initiate gait activities  Skilled Therapeutic Interventions/Progress Updates:   Patient in bed upon arrival with family present. Performed supine > sit with HOB flat and max multimodal cues for sequencing and technique and max A overall, assist to bring LUE across body to roll to R.   Performed squat pivot transfer to R with +2A for safety from bed > new 20 x 20 TIS wheelchair with patient reporting improved sitting tolerance and comfort in larger chair. Patient instructed in locking/unlocking R brake with supervision.  Demonstrated performing pressure relief in TIS wheelchair to family and education provided on frequency and duration, mother verbalized understanding.   Patient performed sit <> stand from wheelchair with max A and verbal cues for sequencing pushing up from arm rest/reaching back for arm rest.   Gait training using R rail in hallway x 27 ft with max A to facilitate LLE advancement/placement with max multimodal cues for upright posture and sequencing gait pattern with +2A for safety with wheelchair follow.   Stair training up/down 8 (3") stairs using R rail with max A for safe advancement/placement LLE, max verbal cues for upright posture, step-to pattern ascending forward and descending backward.   Instructed in Kinetron at 50 cm/sec from seated position in wheelchair for BLE neuro re-ed, 3 trials x 60 sec each. Patient left sitting in TIS wheelchair with family present and needs in reach.   Therapy Documentation Precautions:  Precautions Precautions: Fall Precaution  Comments: watch BP, and O2 saturations on ATC Restrictions Weight Bearing Restrictions: No Vital Signs: Therapy Vitals Pulse Rate: 95 (after ambulation) Resp: 16 Oxygen Therapy SpO2: 100 % O2 Device: Not Delivered FiO2 (%): 21 % Pain: Pain Assessment Pain Assessment: No/denies pain   See Function Navigator for Current Functional Status.   Therapy/Group: Individual Therapy  Kerney ElbeVarner, [name] A 09/06/2015, 2:53 PM

## 2015-09-06 NOTE — Patient Care Conference (Signed)
Inpatient RehabilitationTeam Conference and Plan of Care Update Date: 09/06/2015   Time: 2:30 PM    Patient Name: Brad Mcgee      Medical Record Number: [id number]  Date of Birth: 12/29/1985 Sex: Male         Room/Bed: 4W14C/4W14C-01 Payor Info: Payor: [employer] / Plan: [employer] OTHER / Product Type: *No Product type* /    Admitting Diagnosis: ICH  Admit Date/Time:  09/01/2015  6:05 PM Admission Comments: No comment available   Primary Diagnosis:  Basal ganglia hemorrhage (HCC) Principal Problem: Basal ganglia hemorrhage [facility](HCC)  Patient Active Problem List   Diagnosis Date Noted  . OSA (obstructive sleep apnea) suspected will need cpap when decannulated 09/05/2015  . Obesity hypoventilation syndrome (HCC)   . Tracheostomy care (HCC)   . Acute blood loss anemia   . Deep venous thrombosis (DVT) of left peroneal vein (HCC) 09/01/2015  . Dysphagia following nontraumatic intracerebral hemorrhage 09/01/2015  . Morbid obesity (HCC) 09/01/2015  . Nontraumatic acute hemorrhage of basal ganglia (HCC) 09/01/2015  . Tracheostomy in place [facility](HCC)   . Leukocytosis   . Fever, unspecified   . Right Basal ganglia hemorrhage (HCC)   . Acute respiratory failure (HCC)   . Acute respiratory failure with hypoxemia (HCC)   . Cytotoxic brain edema (HCC) 08/18/2015  . Brain herniation (HCC) 08/18/2015  . Accelerated hypertension 08/16/2015    Expected Discharge Date: Expected Discharge Date: 09/27/15  Team Members Present: Physician leading conference: Dr. Maryla MorrowAnkit Patel Social Worker Present: Amada JupiterLucy Dekota Shenk, LCSW Nurse Present: Carmie EndAngie Joyce, RN PT Present: Teodoro Kilaitlin Penven-Crew, PT OT Present: Callie FieldingKatie Pittman, OT SLP Present: Feliberto Gottronourtney Payne, SLP PPS Coordinator present : Tora DuckMarie Noel, RN, CRRN     Current Status/Progress Goal Weekly Team Focus  Medical   Left hemiparesis, cognitive and functional deficitssecondary to right basal ganglia hemorrhage with Trach  Improve safety, mobility   see above, decanulate   Bowel/Bladder   Incontinent of Bowel/Bladder; requires I&O cath but has been unrinating very good  Patient to be continent of Bowel/Bladder with Mod Assist   Monitor patient Bowel/Bladder needs and timed toilet Q2hrs   Swallow/Nutrition/ Hydration   Dys. 1 textures with nectar-thick liquids, Min A verbal cues. Initiated the water protocol  Mod I with least restrictive diet  FEES this week, trials of upgraded solids/liquids    ADL's   max A for UB self care, +2 assist for functional transfers and LB self care.  min - mod A  functional transfers, self care retraining, L NMR, safety, balance   Mobility   +2 transfers, initiating gait with therapy  min assist transfers, supervision w/c level, gait with therapy only  activity tolerance, balance, postural control, funtional mobility, LLE NMR   Communication   Decannulated today, supervision verbal cues to apply pressure to stoma   Mod I  increased vocal intensity    Safety/Cognition/ Behavioral Observations  Min-Mod A  Supervision  problem solving &  awareness   Pain   Denied any pain or discomfort  <3  Assess and treate pain Q shift and as needed   Skin   Trach site wound and MASD on groin  Patient skin to remain free from new skin breakdown/infection  Assess skin q shift and as needed    Rehab Goals Patient on target to meet rehab goals: Yes *See Care Plan and progress notes for long and short-term goals.  Barriers to Discharge: +Trach, safety awareness, hemiplegia    Possible Resolutions to Barriers:  Therapies,  decanulate today    Discharge Planning/Teaching Needs:  Home with family to provide 24/7 assistance.  Teaching is ongoing.   Team Discussion:  Janina Mayo out today;  Medically stable but continue to monitor resp status.  Hope to repeat MBS vs FEES on Friday.  Mild/mod cognitive deficits.  Max to Max +2 for mobility.  Strong pusher to left.  Some return in fingers/ hand.  Goals at min assist transfers.   Revisions to Treatment Plan:  None   Continued Need for Acute Rehabilitation Level of Care: The patient requires daily medical management by a physician with specialized training in physical medicine and rehabilitation for the following conditions: Daily direction of a multidisciplinary physical rehabilitation program to ensure safe treatment while eliciting the highest outcome that is of practical value to the patient.: Yes Daily medical management of patient stability for increased activity during participation in an intensive rehabilitation regime.: Yes Daily analysis of laboratory values and/or radiology reports with any subsequent need for medication adjustment of medical intervention for : Neurological problems;Pulmonary problems  Brad Mcgee 09/06/2015, 7:45 PM

## 2015-09-06 NOTE — Progress Notes (Signed)
Speech Language Pathology Daily Session Note  Patient Details  Name: Brad Mcgee MRN: [id number] Date of Birth: 07/09/1985  Today's Date: 09/06/2015 SLP Individual Time: 1500-1530 SLP Individual Time Calculation (min): 30 min  Short Term Goals: Week 1: SLP Short Term Goal 1 (Week 1): Pt will utilize an increased vocal intensity at the sentence level with Min A verbal cues to achieve 100% intelligibility.  SLP Short Term Goal 2 (Week 1): Pt will perform diaphargmatic breathing exercises with min A verbal cues to maximize breath support for speech intelligibility and cough strength.  SLP Short Term Goal 3 (Week 1): Pt will perform lingual, labial and mandibular resistance exercises to work towards improvoing oral motor strength for advancement of diet with Mod A verbal cues.  SLP Short Term Goal 4 (Week 1): Pt will consume trials of dysphagia 2 diet without s/s of aspiration with Mod A  verbal cues for use of swallowing compensatory strategies.  SLP Short Term Goal 5 (Week 1): Pt will consume trials of ice chips without s/s of aspiration with Min A verbal cues for use of swallowing compensatory strategies.  SLP Short Term Goal 6 (Week 1): Pt will demonstrate functional problem solving for complex tasks with Min A verbal cues.   Skilled Therapeutic Interventions: Skilled treatment session focused on speech and dysphagia goals. Patient has been decannulated and required supervision verbal cues to apply pressure to his stoma during verbal expression and while coughing. Patient also required Min A verbal cues for use of an increased vocal intensity at the phrase level to achieve 100% intelligibility. Patient performed oral care via the suction toothbrush with set-up assist and consumed trials of ice chips and thin liquids via tsp without overt s/s of aspiration. However, patient with h/o silent aspiration, therefore, an objective swallow study is needed to assess for possible upgrade. SLP also initiated  the water protocol. Patient's family present and verbalized understanding of all information. Patient left upright in wheelchair with family present. Continue with current plan of care.    Function:  Eating Eating   Modified Consistency Diet: Yes Eating Assist Level: Supervision or verbal cues;Helper checks for pocketed food;Set up assist for   Eating Set Up Assist For: Opening containers       Cognition Comprehension Comprehension assist level: Understands basic 75 - 89% of the time/ requires cueing 10 - 24% of the time  Expression Expression assistive device: Talk trach valve Expression assist level: Expresses basic 75 - 89% of the time/requires cueing 10 - 24% of the time. Needs helper to occlude trach/needs to repeat words.  Social Interaction Social Interaction assist level: Interacts appropriately 90% of the time - Needs monitoring or encouragement for participation or interaction.  Problem Solving Problem solving assist level: Solves basic 75 - 89% of the time/requires cueing 10 - 24% of the time  Memory Memory assist level: Recognizes or recalls 75 - 89% of the time/requires cueing 10 - 24% of the time    Pain Pain Assessment Pain Assessment: No/denies pain  Therapy/Group: Individual Therapy  Cordaro Mukai 09/06/2015, 4:30 PM

## 2015-09-06 NOTE — Progress Notes (Signed)
[facility] PHYSICAL MEDICINE & REHABILITATION     PROGRESS NOTE  Subjective/Complaints:  Patient seen lying in bed this morning. His trach. He did not have any issues with his CPAP overnight.  ROS: Denies CP, SOB, N/V/D.  Objective: Vital Signs: Blood pressure 102/69, pulse 86, temperature 98.7 F (37.1 C), temperature source Oral, resp. rate 16, height 5\' 8"  (1.727 m), weight 135.172 kg (298 lb), SpO2 100 %. No results found.  Recent Labs  09/05/15 0549  WBC 9.0  HGB 11.8*  HCT 34.9*  PLT 377   No results for input(s): NA, K, CL, GLUCOSE, BUN, CREATININE, CALCIUM in the last 72 hours.  Invalid input(s): CO CBG (last 3)  No results for input(s): GLUCAP in the last 72 hours.  Wt Readings from Last 3 Encounters:  09/06/15 135.172 kg (298 lb)  09/01/15 126.3 kg (278 lb 7.1 oz)  08/01/15 135.172 kg (298 lb)    Physical Exam:  BP 102/69 mmHg  Pulse 86  Temp(Src) 98.7 F (37.1 C) (Oral)  Resp 16  Ht 5\' 8"  (1.727 m)  Wt 135.172 kg (298 lb)  BMI 45.32 kg/m2  SpO2 100% Constitutional: He appears well-developed and well-nourished. No distress.  Obese HENT: Normocephalic and atraumatic.  Eyes: Conjunctivae and EOM are normal.   Neck: #6 Cuffless trach,.  Cardiovascular: Regular rate and rhythm.   Respiratory: Effort normal. No accessory muscle usage or stridor. No respiratory distress.   GI: Soft. Bowel sounds are normal. He exhibits no distension. There is no tenderness.  Musculoskeletal: He exhibits no edema or tenderness.  Neurological: He is alert and oriented x3.  Sensation intact to light touch.  Motor: RUE/RLE: 5/5 proximal to distal LUE: 0/5 proximally, able to wiggle fingers now LLE: 2/5 proximal to distal No increased tone noted.  Skin: Skin is warm and dry. He is not diaphoretic.  Psychiatric: His mood and behavior appear normal.    Assessment/Plan: 1. Functional deficits secondary to right basal ganglia hemorrhage which require 3+ hours per day of  interdisciplinary therapy in a comprehensive inpatient rehab setting. Physiatrist is providing close team supervision and 24 hour management of active medical problems listed below. Physiatrist and rehab team continue to assess barriers to discharge/monitor patient progress toward functional and medical goals.  Function:  Bathing Bathing position   Position: Wheelchair/chair at sink  Bathing parts Body parts bathed by patient: Left arm, Chest, Abdomen, Right upper leg, Left upper leg Body parts bathed by helper: Right arm, Front perineal area, Buttocks, Right lower leg, Left lower leg, Back  Bathing assist Assist Level: 2 helpers      Upper Body Dressing/Undressing Upper body dressing   What is the patient wearing?: Pull over shirt/dress     Pull over shirt/dress - Perfomed by patient: Thread/unthread right sleeve Pull over shirt/dress - Perfomed by helper: Thread/unthread left sleeve, Put head through opening, Pull shirt over trunk        Upper body assist Assist Level:  (max A)      Lower Body Dressing/Undressing Lower body dressing   What is the patient wearing?: Pants, Non-skid slipper socks     Pants- Performed by patient: Thread/unthread right pants leg Pants- Performed by helper: Thread/unthread right pants leg, Thread/unthread left pants leg, Pull pants up/down, Fasten/unfasten pants   Non-skid slipper socks- Performed by helper: Don/doff left sock, Don/doff right sock                  Lower body assist Assist for lower body  dressing: 2 Audiological scientist steps completed by helper: Adjust clothing prior to toileting, Performs perineal hygiene, Adjust clothing after toileting    Toileting assist Assist level: Two helpers   Transfers Chair/bed transfer   Chair/bed transfer method: Squat pivot Chair/bed transfer assist level: 2 helpers Chair/bed transfer assistive device: Mechanical lift Mechanical lift: Armed forces operational officer Ambulation activity did not occur: Safety/medical concerns         Wheelchair   Type: Manual Max wheelchair distance: 100 Assist Level: Total assistance (Pt < 25%)  Cognition Comprehension Comprehension assist level: Understands basic 75 - 89% of the time/ requires cueing 10 - 24% of the time  Expression Expression assist level: Expresses basic 50 - 74% of the time/requires cueing 25 - 49% of the time. Needs to repeat parts of sentences.  Social Interaction Social Interaction assist level: Interacts appropriately 90% of the time - Needs monitoring or encouragement for participation or interaction.  Problem Solving Problem solving assist level: Solves basic 75 - 89% of the time/requires cueing 10 - 24% of the time  Memory Memory assist level: Recognizes or recalls 75 - 89% of the time/requires cueing 10 - 24% of the time     Medical Problem List and Plan: 1. Left hemiparesis, cognitive and functional deficitssecondary to right basal ganglia hemorrhage  Cont CIR  Resting hand splint, wrist cock up splint, PRAFO  Fluoxetine started 6/19 2. Left peroneal DVT/Anticoagulation: IVC filter placed on 06/16, Lovenox  daily. 3. Pain Management: tylenol prn.  4. Mood: LCSW to follow for evaluation and support.  5. Neuropsych: This patient is not fully capable of making decisions on his own behalf. 6. Skin/Wound Care: Routine pressure relief measures.  7. Fluids/Electrolytes/Nutrition: Monitor intake on dysphagia 1, nectar liquids. Advance diet as tolerated. 8. VDRF s/p trach: Changed to CFS # 6 on 06/14--high risk for OSA. Will need CPAP if decannulated per CCM. Patient capped night of of 6/20. Plantar decannulation today.   9. Acute on chronic CHF: Likely due to hypertonic saline. Check daily weights and montor I/O.  10. Malignant HTN: Monitor BP.  11. Leukocytosis due to MSSA bronchitis v/s PNA and Enterobacter [id number]    Cont to monitor temps and  for other signs of infection. Completed rocephin 6/18. 12. ABLA: Stable.   Hb 11.8 on 6/20 13. Enterobacter UTI: Completed rocephin 06/18.   Discontinued foley 14. Morbid obesity  Body mass index is 45.32 kg/(m^2).  Diet and exercise education  Cont to encourage weight loss to increase endurance and promote overall health   LOS (Days) 5 A FACE TO FACE EVALUATION WAS PERFORMED  Cyril Railey Karis Juba 09/06/2015 11:03 AM

## 2015-09-06 NOTE — Progress Notes (Signed)
Physical Therapy Session Note  Patient Details  Name: Brad Mcgee MRN: [id number] Date of Birth: [birth year]  Today's Date: 09/06/2015 PT Individual Time: 0910-1006 PT Individual Time Calculation (min): 56 min   Short Term Goals: Week 1:  PT Short Term Goal 1 (Week 1): Pt with be CGA for bed mobility PT Short Term Goal 2 (Week 1): Pt will be CGA for transfers PT Short Term Goal 3 (Week 1): Pt will initiate gait activities  Skilled Therapeutic Interventions/Progress Updates:    Pt received resting in bed, no c/o pain and agreeable to therapy session.  Session focus on bed mobility, transfers, functional use of LLE, LLE weight bearing, and midline orientation in sitting/standing.    LB dressing at bed level with assist to thread LEs and to pull pants over L hip.  Max assist to roll to complete pulling up pants.  UB dressing at bed level with assist to thread LUE through sleeve.  Supine>sit with mod assist, PT provided mod multimodal cues for pt to use RLE to bring LLE to EOB and mod assist to elevate trunk.  +2 total assist for squat/pivot from bed>w/c on R.    Sit<>stand x2 in // bars with max assist to rise and max to total assist to maintain upright posture with multimodal cues for R weight shift and L knee blocking.  Initiated marching in // bars, pt able to clear LLE from floor in R stance; L knee buckles despite blocking when attempting to lift RLE in L stance.  Static standing focus on midline orientation, R weight shift, and L knee control x1 minute with max assist from therapist.    PT measured pt for w/c, will need 20x20 and would benefit from tilt in space for pressure relief at this time.  Pt returned to room in w/c at end of session and left tilted with call bell in reach and needs met.   Therapy Documentation Precautions:  Precautions Precautions: Fall Precaution Comments: watch BP, and O2 saturations on ATC Restrictions Weight Bearing Restrictions: No   See Function  Navigator for Current Functional Status.   Therapy/Group: Individual Therapy  [name] 09/06/2015, 10:52 AM

## 2015-09-07 ENCOUNTER — Inpatient Hospital Stay (HOSPITAL_COMMUNITY): Payer: 59 | Admitting: Speech Pathology

## 2015-09-07 ENCOUNTER — Inpatient Hospital Stay (HOSPITAL_COMMUNITY): Payer: 59 | Admitting: Occupational Therapy

## 2015-09-07 ENCOUNTER — Inpatient Hospital Stay (HOSPITAL_COMMUNITY): Payer: 59 | Admitting: Physical Therapy

## 2015-09-07 NOTE — Progress Notes (Addendum)
Occupational Therapy Session Note  Patient Details  Name: Brad MaxwellShawon M Rouser MRN: [id number] Date of Birth: 01/10/1986  Today's Date: 09/07/2015 OT Individual Time: 1001-1100 and 1500-1530 OT Individual Time Calculation (min): 59 min and 30 min    Short Term Goals: Week 1:  OT Short Term Goal 1 (Week 1): Pt will complete toilet transfer with max A OT Short Term Goal 2 (Week 1): Pt will dress UB with mod A OT Short Term Goal 3 (Week 1): Pt will carry over hemi-dressing technique during ADL session with min questioning cues OT Short Term Goal 4 (Week 1): Pt will maintain static sitting balance with mod A in prep for functional task  Skilled Therapeutic Interventions/Progress Updates:    Session 1: Upon entering the room, pt seated in wheelchair awaiting therapist and requesting to wash self at sink. Pt brushing teeth with set up A at sink side. UB dressing performed with pt in seated position and hand over hand assist to incorporate L UE in functional tasks. Pt requiring mod cues this session for safety awareness of L UE during transfers. Pt standing within STEDY to wash buttocks and peri area with total A for balance and strong L lean. Pt began diaphoretic and reports dizziness. BP taken with results being 123/73 in seated position. Pt requesting to return to bed at this time. RN arrived to assess pt and BP taken again in supine and remained WNL of 117/74. Pt continues be diaphoretic but reports dizziness has subsided. Pt supine in bed with call bell and all needed items within reach. Bed alarm activated and girlfriend enters the room as OT exits.    Session 2: Upon entering the room, pt supine in bed with no c/o pain. Pt reporting, "I need to pee." Pt performed bed mobility with max A to EOB. Therapist sitting to L of pt on bed with L UE draped over shoulders. OT providing support at L knee while standing pt from bed with max A. Pt standing for 6 minutes while attempting to use urinal. Second helper  needed for clothing management and management of urinal.  Pt able to successfully void. Pt returning to seated position on EOB. Max A to return to supine. Pt positioned to protect hemiplegic side. Bed alarm activated. Call bell and all needed items within reach.  Therapy Documentation Precautions:  Precautions Precautions: Fall Precaution Comments: watch BP, and O2 saturations on ATC Restrictions Weight Bearing Restrictions: No  See Function Navigator for Current Functional Status.   Therapy/Group: Individual Therapy  Lowella Gripittman, Samiel Peel L 09/07/2015, 11:44 AM

## 2015-09-07 NOTE — Progress Notes (Signed)
Speech Language Pathology Daily Session Note  Patient Details  Name: Brad Mcgee MRN: [id number] Date of Birth: 03/21/1985  Today's Date: 09/07/2015 SLP Individual Time: 1300-1345 SLP Individual Time Calculation (min): 45 min  Short Term Goals: Week 1: SLP Short Term Goal 1 (Week 1): Pt will utilize an increased vocal intensity at the sentence level with Min A verbal cues to achieve 100% intelligibility.  SLP Short Term Goal 2 (Week 1): Pt will perform diaphargmatic breathing exercises with min A verbal cues to maximize breath support for speech intelligibility and cough strength.  SLP Short Term Goal 3 (Week 1): Pt will perform lingual, labial and mandibular resistance exercises to work towards improvoing oral motor strength for advancement of diet with Mod A verbal cues.  SLP Short Term Goal 4 (Week 1): Pt will consume trials of dysphagia 2 diet without s/s of aspiration with Mod A  verbal cues for use of swallowing compensatory strategies.  SLP Short Term Goal 5 (Week 1): Pt will consume trials of ice chips without s/s of aspiration with Min A verbal cues for use of swallowing compensatory strategies.  SLP Short Term Goal 6 (Week 1): Pt will demonstrate functional problem solving for complex tasks with Min A verbal cues.   Skilled Therapeutic Interventions: Pt seen for skilled SLP therapy focusing on dysphagia and voicing. Pt able to cover stoma with each verbalization, but required intermittent cueing with model to utilize voicing. Pt tolerated trials of 4 oz thin via spoon and cup sip with only 2 episodes of mild cough. Encouraged increased strength with cough for adequate airway protetion.  Discussed plan for FEES with pt and parents- all parties verbalized agreement and consent was received. Pt requested the urinal x 2, but was unable to void. Pt was unable to recall the last time he voided and RN was made aware of potential need for bladder scan. Will follow. FEES tomorrow.    Function:  Eating Eating   Modified Consistency Diet: Yes Eating Assist Level: Set up assist for;Supervision or verbal cues     Helper Scoops Food on Utensil: Occasionally     Cognition Comprehension Comprehension assist level: Understands basic 75 - 89% of the time/ requires cueing 10 - 24% of the time  Expression   Expression assist level: Expresses basic 75 - 89% of the time/requires cueing 10 - 24% of the time. Needs helper to occlude trach/needs to repeat words.  Social Interaction Social Interaction assist level: Interacts appropriately 90% of the time - Needs monitoring or encouragement for participation or interaction.  Problem Solving Problem solving assist level: Solves basic 75 - 89% of the time/requires cueing 10 - 24% of the time  Memory Memory assist level: Recognizes or recalls 75 - 89% of the time/requires cueing 10 - 24% of the time    Pain Pain Assessment Pain Assessment: 0-10 Pain Score: 4  Pain Location: Buttocks Patients Stated Pain Goal: 0 Pain Intervention(s): Repositioned  Therapy/Group: Individual Therapy  Rocky CraftsKara E Naama Sappington MA, CCC-SLP 09/07/2015, 3:46 PM

## 2015-09-07 NOTE — Progress Notes (Signed)
Physical Therapy Session Note  Patient Details  Name: Brad Mcgee MRN: [id number] Date of Birth: 1986/01/27  Today's Date: 09/07/2015 PT Individual Time: 0903-1000 PT Individual Time Calculation (min): 57 min   Short Term Goals: Week 1:  PT Short Term Goal 1 (Week 1): Pt with be CGA for bed mobility PT Short Term Goal 2 (Week 1): Pt will be CGA for transfers PT Short Term Goal 3 (Week 1): Pt will initiate gait activities  Skilled Therapeutic Interventions/Progress Updates:    Pt received resting in bed with no c/o pain and agreeable to therapy.  Pt continues to be highly labile throughout session.  Session focus on bed mobility, transfers, gait, balance, and NMR.    Lower body dressing at bed level focus on rolling and bridging to don shorts.  Socks and shoes donned total assist for time management.  Supine>L side lying>sit with max assist and max verbal cues for sequencing.  Squat/pivot to w/c on pt's right with max assist +1 and multimodal cues for sequencing, weight shift, and reach to R.    NMR/gait training at R hand rail with max/mod assist for placement of LLE and max multimodal cues for trunk control and sequencing.  30'x2 with extended seated rest break between trials for recovery.   Squat/pivot x2 to R side with max assist +1 with verbal cues for forward weight shift and reaching with RUE and manual facilitation for head/hips relationship.    Dynamic sitting balance sitting edge of mat with BLE support and LUE weight bearing through forearm resting on pt's leg 3x1 minute boxing with RUE.  Pt able to maintain dynamic sitting balance with steady assist and static sitting balance with close supervision.    Pt returned to room in tilt in space w/c at end of session and positioned with call bell in reach and needs met.   Therapy Documentation Precautions:  Precautions Precautions: Fall Precaution Comments: watch BP, and O2 saturations on ATC Restrictions Weight Bearing  Restrictions: No   See Function Navigator for Current Functional Status.   Therapy/Group: Individual Therapy  Diala Waxman E Penven-Crew 09/07/2015, 10:05 AM

## 2015-09-07 NOTE — Progress Notes (Signed)
Pt working with OT, this RN called to room, pt c/o being dizzying and sweating while standing BP 123/73 during episodes, pt however was able to complete tasks/theapy,   When arrived to room pt back in Bed with no complaints, BP 117/74, pt stated felt better, nursing will continue to monitor and update PA

## 2015-09-07 NOTE — Progress Notes (Signed)
[facility] PHYSICAL MEDICINE & REHABILITATION     PROGRESS NOTE  Subjective/Complaints:  Patient seen sleeping in bed this morning. His CPAP fell off. He states he slept comfortably. Patient decannulated yesterday. He denies any issues.  ROS: Denies CP, SOB, N/V/D.  Objective: Vital Signs: Blood pressure 117/62, pulse 86, temperature 98.9 F (37.2 C), temperature source Oral, resp. rate 19, height 5\' 8"  (1.727 m), weight 135.22 kg (298 lb 1.7 oz), SpO2 99 %. No results found.  Recent Labs  09/05/15 0549  WBC 9.0  HGB 11.8*  HCT 34.9*  PLT 377   No results for input(s): NA, K, CL, GLUCOSE, BUN, CREATININE, CALCIUM in the last 72 hours.  Invalid input(s): CO CBG (last 3)  No results for input(s): GLUCAP in the last 72 hours.  Wt Readings from Last 3 Encounters:  09/07/15 135.22 kg (298 lb 1.7 oz)  09/01/15 126.3 kg (278 lb 7.1 oz)  08/01/15 135.172 kg (298 lb)    Physical Exam:  BP 117/62 mmHg  Pulse 86  Temp(Src) 98.9 F (37.2 C) (Oral)  Resp 19  Ht 5\' 8"  (1.727 m)  Wt 135.22 kg (298 lb 1.7 oz)  BMI 45.34 kg/m2  SpO2 99% Constitutional: He appears well-developed and well-nourished. No distress.  Obese HENT: Normocephalic and atraumatic.  Eyes: Conjunctivae and EOM are normal.   Neck: Now decannulated.  Cardiovascular: Regular rate and rhythm.   Respiratory: Effort normal. No accessory muscle usage or stridor. No respiratory distress.   GI: Soft. Bowel sounds are normal. He exhibits no distension. There is no tenderness.  Musculoskeletal: He exhibits no edema or tenderness.  Neurological: He is alert and oriented x3.  Sensation intact to light touch.  Motor: RUE/RLE: 5/5 proximal to distal LUE: 0/5 proximally, wiggles fingers LLE: 2/5 proximal to distal No increased tone noted.  Skin: Skin is warm and dry. He is not diaphoretic.  Psychiatric: His mood and behavior appear normal.    Assessment/Plan: 1. Functional deficits secondary to right basal  ganglia hemorrhage which require 3+ hours per day of interdisciplinary therapy in a comprehensive inpatient rehab setting. Physiatrist is providing close team supervision and 24 hour management of active medical problems listed below. Physiatrist and rehab team continue to assess barriers to discharge/monitor patient progress toward functional and medical goals.  Function:  Bathing Bathing position   Position: Wheelchair/chair at sink  Bathing parts Body parts bathed by patient: Left arm, Chest, Abdomen, Right upper leg, Left upper leg, Front perineal area, Buttocks Body parts bathed by helper: Right arm, Right lower leg, Left lower leg  Bathing assist Assist Level: 2 helpers      Upper Body Dressing/Undressing Upper body dressing   What is the patient wearing?: Pull over shirt/dress     Pull over shirt/dress - Perfomed by patient: Thread/unthread right sleeve Pull over shirt/dress - Perfomed by helper: Thread/unthread left sleeve, Put head through opening        Upper body assist Assist Level:  (max A)      Lower Body Dressing/Undressing Lower body dressing   What is the patient wearing?: Pants, Non-skid slipper socks     Pants- Performed by patient: Thread/unthread right pants leg Pants- Performed by helper: Thread/unthread right pants leg, Thread/unthread left pants leg, Pull pants up/down, Fasten/unfasten pants   Non-skid slipper socks- Performed by helper: Don/doff left sock, Don/doff right sock                  Lower body assist Assist for lower  body dressing: 2 Audiological scientistHelpers      Toileting Toileting     Toileting steps completed by helper: Adjust clothing prior to toileting, Performs perineal hygiene, Adjust clothing after toileting    Toileting assist Assist level: Two helpers   Transfers Chair/bed transfer   Chair/bed transfer method: Squat pivot Chair/bed transfer assist level: Maximal assist (Pt 25 - 49%/lift and lower) Chair/bed transfer assistive  device: Armrests Mechanical lift: Stedy   Locomotion Ambulation Ambulation activity did not occur: Safety/medical concerns   Max distance: 30+30 Assist level: 2 helpers (max assist for gait, +2 for w/c follow)   Wheelchair   Type: Manual (TIS) Max wheelchair distance: 100 Assist Level: Dependent (Pt equals 0%)  Cognition Comprehension Comprehension assist level: Understands basic 75 - 89% of the time/ requires cueing 10 - 24% of the time  Expression Expression assist level: Expresses basic 75 - 89% of the time/requires cueing 10 - 24% of the time. Needs helper to occlude trach/needs to repeat words.  Social Interaction Social Interaction assist level: Interacts appropriately 90% of the time - Needs monitoring or encouragement for participation or interaction.  Problem Solving Problem solving assist level: Solves basic 75 - 89% of the time/requires cueing 10 - 24% of the time  Memory Memory assist level: Recognizes or recalls 75 - 89% of the time/requires cueing 10 - 24% of the time     Medical Problem List and Plan: 1. Left hemiparesis, cognitive and functional deficitssecondary to right basal ganglia hemorrhage  Cont CIR  Resting hand splint, wrist cock up splint, PRAFO  Fluoxetine started 6/19 2. Left peroneal DVT/Anticoagulation: IVC filter placed on 06/16, Lovenox 60mg  daily. 3. Pain Management: tylenol prn.  4. Mood: LCSW to follow for evaluation and support.  5. Neuropsych: This patient is not fully capable of making decisions on his own behalf. 6. Skin/Wound Care: Routine pressure relief measures.  7. Fluids/Electrolytes/Nutrition: Monitor intake on dysphagia 1, nectar liquids. Advance diet as tolerated. 8. VDRF s/p trach: Decannulated on 6/21  CPAP daily at bedtime   9. Acute on chronic CHF: Likely due to hypertonic saline. Check daily weights and montor I/O.  10. Malignant HTN: Monitor BP.  11. Leukocytosis due to MSSA bronchitis v/s PNA and Enterobacter  [id number]:Resolved    Cont to monitor temps and for other signs of infection. Completed rocephin 6/18. 12. ABLA: Stable.   Hb 11.8 on 6/20 13. Enterobacter UTI: Completed rocephin 06/18.   Discontinued foley 14. Morbid obesity  Body mass index is 45.34 kg/(m^2).  Diet and exercise education  Cont to encourage weight loss to increase endurance and promote overall health   LOS (Days) 6 A FACE TO FACE EVALUATION WAS PERFORMED  Cayleb Jarnigan Karis Jubanil Amanpreet Delmont 09/07/2015 10:16 AM

## 2015-09-08 ENCOUNTER — Inpatient Hospital Stay (HOSPITAL_COMMUNITY): Payer: 59 | Admitting: Speech Pathology

## 2015-09-08 ENCOUNTER — Encounter (HOSPITAL_COMMUNITY): Payer: Self-pay

## 2015-09-08 ENCOUNTER — Inpatient Hospital Stay (HOSPITAL_COMMUNITY): Payer: 59 | Admitting: Physical Therapy

## 2015-09-08 ENCOUNTER — Inpatient Hospital Stay (HOSPITAL_COMMUNITY): Payer: 59 | Admitting: Occupational Therapy

## 2015-09-08 DIAGNOSIS — N179 Acute kidney failure, unspecified: Secondary | ICD-10-CM | POA: Diagnosis present

## 2015-09-08 DIAGNOSIS — E871 Hypo-osmolality and hyponatremia: Secondary | ICD-10-CM | POA: Insufficient documentation

## 2015-09-08 DIAGNOSIS — I69391 Dysphagia following cerebral infarction: Secondary | ICD-10-CM

## 2015-09-08 LAB — CBC WITH DIFFERENTIAL/PLATELET
Basophils Absolute: 0.1 10*3/uL (ref 0.0–0.1)
Basophils Relative: 1 %
Eosinophils Absolute: 0.2 10*3/uL (ref 0.0–0.7)
Eosinophils Relative: 3 %
HCT: 39.6 % (ref 39.0–52.0)
Hemoglobin: 13.8 g/dL (ref 13.0–17.0)
Lymphocytes Relative: 32 %
Lymphs Abs: 2.1 10*3/uL (ref 0.7–4.0)
MCH: 32.3 pg (ref 26.0–34.0)
MCHC: 34.8 g/dL (ref 30.0–36.0)
MCV: 92.7 fL (ref 78.0–100.0)
Monocytes Absolute: 0.6 10*3/uL (ref 0.1–1.0)
Monocytes Relative: 9 %
Neutro Abs: 3.6 10*3/uL (ref 1.7–7.7)
Neutrophils Relative %: 55 %
Platelets: 345 10*3/uL (ref 150–400)
RBC: 4.27 MIL/uL (ref 4.22–5.81)
RDW: 12.7 % (ref 11.5–15.5)
WBC: 6.6 10*3/uL (ref 4.0–10.5)

## 2015-09-08 LAB — BASIC METABOLIC PANEL
Anion gap: 9 (ref 5–15)
BUN: 41 mg/dL — ABNORMAL HIGH (ref 6–20)
CO2: 22 mmol/L (ref 22–32)
Calcium: 9.6 mg/dL (ref 8.9–10.3)
Chloride: 97 mmol/L — ABNORMAL LOW (ref 101–111)
Creatinine, Ser: 1.3 mg/dL — ABNORMAL HIGH (ref 0.61–1.24)
GFR calc Af Amer: >60 mL/min (ref >60)
GFR calc non Af Amer: >60 mL/min (ref >60)
Glucose, Bld: 120 mg/dL — ABNORMAL HIGH (ref 65–99)
Potassium: 4.7 mmol/L (ref 3.5–5.1)
Sodium: 128 mmol/L — ABNORMAL LOW (ref 135–145)

## 2015-09-08 NOTE — Progress Notes (Signed)
Patient attempt to void x2 with no success. Patient helped to feet with a 2 person assist in order to attempt.

## 2015-09-08 NOTE — Procedures (Signed)
CPAP is bedside and ready for use. Patient stated her family would place on her when ready for bed and did not need any assistance.

## 2015-09-08 NOTE — Progress Notes (Signed)
[place] [facility]IT[facility]     PROGRESS NOTE  Subjective/Complaints:  Patient seen sleeping in bed this morning. Family is at bedside. He is easily arousable today. He notes that he had some pain in his left leg overnight, but that has resolved. He denies respiratory difficulties. He did not wear his PRAFO overnight, his family was unaware.  ROS: Denies CP, SOB, N/V/D.  Objective: Vital Signs: Blood pressure 145/90, pulse 94, temperature 97.6 F (36.4 C), temperature source Oral, resp. rate 20, height 5\' 8"  (1.727 m), weight 120.9 kg (266 lb 8.6 oz), SpO2 96 %. No results found.  Recent Labs  09/08/15 0637  WBC 6.6  HGB 13.8  HCT 39.6  PLT 345    Recent Labs  09/08/15 0637  NA 128*  K 4.7  CL 97*  GLUCOSE 120*  BUN 41*  CREATININE 1.30*  CALCIUM 9.6   CBG (last 3)  No results for input(s): GLUCAP in the last 72 hours.  Wt Readings from Last 3 Encounters:  09/08/15 120.9 kg (266 lb 8.6 oz)  09/01/15 126.3 kg (278 lb 7.1 oz)  08/01/15 135.172 kg (298 lb)    Physical Exam:  BP 145/90 mmHg  Pulse 94  Temp(Src) 97.6 F (36.4 C) (Oral)  Resp 20  Ht 5\' 8"  (1.727 m)  Wt 120.9 kg (266 lb 8.6 oz)  BMI 40.54 kg/m2  SpO2 96% Constitutional: He appears well-developed and well-nourished. No distress.  Obese HENT: Normocephalic and atraumatic.  Eyes: Conjunctivae and EOM are normal.   Neck: Stoma healing.  Cardiovascular: Regular rate and rhythm.   Respiratory: Effort normal. No accessory muscle usage or stridor. No respiratory distress.   GI: Soft. Bowel sounds are normal. He exhibits no distension. There is no tenderness.  Musculoskeletal: He exhibits no edema or tenderness.  Neurological: He is alert and oriented x3.  Sensation intact to light touch.  Motor: RUE/RLE: 5/5 proximal to distal LUE: 0/5 proximally, wiggles fingers LLE: 3-/5 proximal to distal No increased tone noted.  Skin: Skin is warm and dry. He is not diaphoretic.   Psychiatric: His mood and behavior appear normal.    Assessment/Plan: 1. Functional deficits secondary to right basal ganglia hemorrhage which require 3+ hours per day of interdisciplinary therapy in a comprehensive inpatient rehab setting. Physiatrist is providing close team supervision and 24 hour management of active medical problems listed below. Physiatrist and rehab team continue to assess barriers to discharge/monitor patient progress toward functional and medical goals.  Function:  Bathing Bathing position   Position: Wheelchair/chair at sink  Bathing parts Body parts bathed by patient: Left arm, Chest, Abdomen, Right upper leg, Left upper leg, Front perineal area, Buttocks Body parts bathed by helper: Right arm, Right lower leg, Left lower leg, Back  Bathing assist Assist Level: 2 helpers      Upper Body Dressing/Undressing Upper body dressing   What is the patient wearing?: Pull over shirt/dress     Pull over shirt/dress - Perfomed by patient: Thread/unthread right sleeve Pull over shirt/dress - Perfomed by helper: Thread/unthread left sleeve, Put head through opening, Pull shirt over trunk        Upper body assist Assist Level:  (max A)      Lower Body Dressing/Undressing Lower body dressing   What is the patient wearing?: Pants     Pants- Performed by patient: Thread/unthread right pants leg Pants- Performed by helper: Thread/unthread right pants leg, Thread/unthread left pants leg, Pull pants up/down, Fasten/unfasten pants  Non-skid slipper socks- Performed by helper: Don/doff left sock, Don/doff right sock                  Lower body assist Assist for lower body dressing: 2 Helpers      Toileting Toileting     Toileting steps completed by helper: Adjust clothing prior to toileting, Performs perineal hygiene, Adjust clothing after toileting    Toileting assist Assist level: Two helpers   Transfers Chair/bed transfer   Chair/bed transfer  method: Squat pivot Chair/bed transfer assist level: Maximal assist (Pt 25 - 49%/lift and lower) Chair/bed transfer assistive device: Armrests Mechanical lift: Stedy   Locomotion Ambulation Ambulation activity did not occur: Safety/medical concerns   Max distance: 30+30 Assist level: 2 helpers (max assist for gait, +2 for w/c follow)   Wheelchair   Type: Manual (TIS) Max wheelchair distance: 100 Assist Level: Dependent (Pt equals 0%)  Cognition Comprehension Comprehension assist level: Understands basic 75 - 89% of the time/ requires cueing 10 - 24% of the time  Expression Expression assist level: Expresses basic 75 - 89% of the time/requires cueing 10 - 24% of the time. Needs helper to occlude trach/needs to repeat words.  Social Interaction Social Interaction assist level: Interacts appropriately 90% of the time - Needs monitoring or encouragement for participation or interaction.  Problem Solving Problem solving assist level: Solves basic 75 - 89% of the time/requires cueing 10 - 24% of the time  Memory Memory assist level: Recognizes or recalls 75 - 89% of the time/requires cueing 10 - 24% of the time     Medical Problem List and Plan: 1. Left hemiparesis, cognitive and functional deficitssecondary to right basal ganglia hemorrhage  Cont CIR  Resting hand splint, wrist cock up splint, PRAFO  Fluoxetine started 6/19 2. Left peroneal DVT/Anticoagulation: IVC filter placed on 06/16, Lovenox 60mg  daily. 3. Pain Management: tylenol prn.  4. Mood: LCSW to follow for evaluation and support.  5. Neuropsych: This patient is not fully capable of making decisions on his own behalf. 6. Skin/Wound Care: Routine pressure relief measures.  7. Fluids/Electrolytes/Nutrition: Monitor intake on dysphagia 1, nectar liquids. Advance diet as tolerated. 8. VDRF s/p trach: Decannulated on 6/21  CPAP daily at bedtime   9. Acute on chronic CHF: Likely due to hypertonic saline. Check daily  weights and montor I/O.  10. Hx of malignant HTN: Monitor BP.   Cont meds for now 11. Leukocytosis due to MSSA bronchitis v/s PNA and Enterobacter UJW:[id number]:Resolved    Cont to monitor temps and for other signs of infection. Completed rocephin 6/18. 12. ABLA: Resolved.   Hb 13.8 on 6/23 13. Enterobacter UTI: Completed rocephin 06/18.   Discontinued foley 14. Morbid obesity  Body mass index is 40.54 kg/(m^2).  Diet and exercise education  Cont to encourage weight loss to increase endurance and promote overall health 15. Hyponatremia  128 on 6/23  Will order labs for tomorrow, if continues to trend down will likely need to DC thiazide. 16. AKI  Creatinine 1.30 on 6/23  Will encourage fluid intake   LOS (Days) 7 A FACE TO FACE EVALUATION WAS PERFORMED  Mccall Lomax Karis Jubanil Shaymus Eveleth 09/08/2015 10:08 AM

## 2015-09-08 NOTE — Progress Notes (Signed)
Occupational Therapy Session Note  Patient Details  Name: Brad Mcgee MRN: [id number] Date of Birth: 09/18/1985  Today's Date: 09/08/2015 OT Individual Time:  -   0800-0900  (60 min)      Short Term Goals: Week 1:  OT Short Term Goal 1 (Week 1): Pt will complete toilet transfer with max A OT Short Term Goal 2 (Week 1): Pt will dress UB with mod A OT Short Term Goal 3 (Week 1): Pt will carry over hemi-dressing technique during ADL session with min questioning cues OT Short Term Goal 4 (Week 1): Pt will maintain static sitting balance with mod A in prep for functional task :     Skilled Therapeutic Interventions/Progress Updates:    Session 1: Pt lying in bed with sister present.  Addressed bed mobility, transfers, standing tolerance.  Pt was min assist with supine to EOB.  Used QRB for safety as pt went from supine to sit >wc with lateral scoot transfer and max assist.  Pt. Did lateral leans to scoot self to back of chair.  He bathed and dressed at sink with stedy for sit to stand.  Utilized +2 for sit to stand  (with stedy) with LLE needed max assist for positioning on foot rest   Pt. Able to stand with min assist for upright posture and foot placement for 1-2 minutes.    Left pt in wc with family and  call bell and all needed items within reach.    Therapy Documentation  Precautions:  Precautions Precautions: Fall Precaution Comments: watch BP, and O2 saturations on ATC Restrictions Weight Bearing Restrictions: No    Vital Signs: Therapy Vitals Temp: 97.6 F (36.4 C) Temp Source: Oral Pulse Rate: 94 Resp: 20 BP: (!) 145/90 mmHg Patient Position (if appropriate): Lying Oxygen Therapy SpO2: 96 % O2 Device:  (decannulated) Pain:  none          See Function Navigator for Current Functional Status.   Therapy/Group: Individual Therapy  [name], [name] 09/08/2015, 7:50 AM

## 2015-09-08 NOTE — Progress Notes (Signed)
Physical Therapy Weekly Progress Note  Patient Details  Name: Brad Mcgee MRN: [id number] Date of Birth: 01-25-1986  Beginning of progress report period: September 02, 2015 End of progress report period: September 08, 2015  Today's Date: 09/08/2015 PT Individual Time: 1300-1415 PT Individual Time Calculation (min): 75 min   Patient has met 1 of 3 short term goals.  Pt had made excellent gains in therapy this week.  He demonstrates significantly improved midline orientation in sitting and standing, has improved with functional transfers, now only requiring +1 assist to transfer, and has initiated gait training first with rail in hallway and progressing to using hemi-walker.   Patient continues to demonstrate the following deficits: balance, postural control, L hemiparesis, unbalanced muscle activation, mildly increased tone in LLE, and decreased safety awareness and therefore will continue to benefit from skilled PT intervention to enhance overall performance with activity tolerance, balance, postural control, functional use of  left upper extremity and left lower extremity, awareness and coordination.  Patient is progressing towards long term goals.  Adjusted gait goals and added balance, transfer, stairs, and w/c propulsion goal. .    PT Short Term Goals Week 1:  PT Short Term Goal 1 (Week 1): Pt with be CGA for bed mobility PT Short Term Goal 1 - Progress (Week 1): Not met PT Short Term Goal 2 (Week 1): Pt will be CGA for transfers PT Short Term Goal 2 - Progress (Week 1): Not met PT Short Term Goal 3 (Week 1): Pt will initiate gait activities PT Short Term Goal 3 - Progress (Week 1): Met Week 2:  PT Short Term Goal 1 (Week 2): Pt will demonstrate bed mobility with mod assist using hospital bed functions PT Short Term Goal 2 (Week 2): Pt will transfer with mod assist and LRAD PT Short Term Goal 3 (Week 2): Pt will amb with mod assist and LRAD PT Short Term Goal 4 (Week 2): Pt will demonstrate  static standing balance with mod assist PT Short Term Goal 5 (Week 2): Pt will tolerate sitting in regular w/c x2 hours  Skilled Therapeutic Interventions/Progress Updates:    Pt received sleeping in bed, awakes with tactile cues, no c/o pain and agreeable to therapy.  Pt reports he did not sleep well last night and is very tired, but continues to be motivated to participate fully in therapy despite fatigue.  Session focus on transfers, scooting, functional use of LLE, gait training, LUE NMR, and balance.   PT donned socks/shoes for time management.  Supine>sit with max assist and squat/pivot to w/c on R with max assist +1.    Gait training with rail in hallway 30' +30' with <1 minute rest break in between.  Pt continues to require w/c follow for safety, but is able to advance BLEs without assist and maintain upright posture with only verbal cues.  Progress to gait training with hemiwalker x50' +50' with 2 turns, with rest break in between trials.  Pt ambs with HW with LUE around therapist's shoulders and therapist provided mod/max assist to maintain balance, but pt continues to be able to advance BLEs and HW without assist from therapist.  Pt able to perform ambulatory transfer to therapy mat with max assist and max multimodal cues for safety and sequencing.    NMR in sitting for balance and midline orientation, reaching forward to target with RUE and returning to midline.  Mirror used for visual feedback in less than 30% of trials.  NMR for LLE kicking  a ball focus on knee flexion before extension.  NMR for LLE holding ball between feet for bilat LAQ x10 reps with facilitation for upright trunk posture.    Pt requesting to attempt to stand and urinate at end of session.  Returned to room and pt performed sit<>stand from w/c with bedrail on R for support, mod assist to stand with LUE draped over therapist's shoulders.  With increased time and max cues pt able to position and hold urinal for voiding (700  mL, RN aware).  Assist needed for clothing management on L.  Squat/pivot back to bed with max assist +1 and pt able to come to supine with mod assist.  Positioned in chair position and set up with lunch tray.  Pt able to recall all precautions for drinking non-thickened water.  Left with call bell in reach and needs met.  Therapy Documentation Precautions:  Precautions Precautions: Fall Precaution Comments: watch BP, and O2 saturations on ATC Restrictions Weight Bearing Restrictions: No   See Function Navigator for Current Functional Status.  Therapy/Group: Individual Therapy  [name] 09/08/2015, 3:40 PM

## 2015-09-08 NOTE — Evaluation (Signed)
Objective Swallowing Evaluation: Type of Study: FEES-Fiberoptic Endoscopic Evaluation of Swallow  Patient Details  Name: Brad MaxwellShawon M Beauchesne MRN: [id number] Date of Birth: 11/18/1985  Today's Date: 09/08/2015 Time: SLP Start Time: 0930 SLP Stop Time: 1030 SLP Time Calculation (min) : 60 min  Past Medical History:  Past Medical History  Diagnosis Date  . Hypertension   . Fracture of left ankle     treated with cast   Past Surgical History: History reviewed. No pertinent past surgical history. HPI: 30 yo male with slurred speech and Lt sided weakness and headache. Intubated 5/30, trach 6/7. CT head which showed 3.7 x 1.9 x 2 cm Rt basal ganglia ICH which then increased to 4.5 x 1.9 x 3.4 cm ICH on f/u CT head.   No Data Recorded  Assessment / Plan / Recommendation  CHL IP CLINICAL IMPRESSIONS 09/08/2015  Therapy Diagnosis Mild pharyngeal phase dysphagia;Moderate pharyngeal phase dysphagia  Clinical Impression Pt demonstrates improvement since previous objective measure of swallow function. Pt's swallow function is primarily characterized by pharyngeal delay resulting in mild laryngeal penetration with thins via cup and intermittent aspiration with cough response with thins via straw. Pt responded well to verbal cueing to take small single sips. Will intiate Dys 2 diet with thin WATER only to transition to thins (from nectar) and ensure compliance with swallow precautions in a safer context. Precautions of upright positioning, NO straws, small  single sips and meds crushed with puree were reviewed with the patient /family and RN and posted in the patient's room. Of note, Pt's false vocal folds appeared quite edematous and limited visualization of the true vocal folds, however TVF did not appear to demonstrate complete posterior closure with phonation. Encouraged pt to practice a strong cough response when he has sensation to cough with PO for improved airway protection. Will continue to follow.  Impact  on safety and function Mild aspiration risk         Prognosis 09/08/2015  Prognosis for Safe Diet Advancement Good  Barriers to Reach Goals --  Barriers/Prognosis Comment --    CHL IP DIET RECOMMENDATION 09/08/2015  SLP Diet Recommendations Dysphagia 2 (Fine chop) solids;Thin liquid  Liquid Administration via Cup;No straw  Medication Administration Crushed with puree  Compensations Slow rate;Small sips/bites;Minimize environmental distractions;Follow solids with liquid  Postural Changes Seated upright at 90 degrees;Remain semi-upright after after feeds/meals (Comment)      CHL IP OTHER RECOMMENDATIONS 09/08/2015  Recommended Consults Consider ENT evaluation  Oral Care Recommendations Oral care BID  Other Recommendations --                   CHL IP ORAL PHASE 09/08/2015  Oral Phase Impaired  Oral - Pudding Teaspoon --  Oral - Pudding Cup --  Oral - Honey Teaspoon --  Oral - Honey Cup --  Oral - Nectar Teaspoon --  Oral - Nectar Cup --  Oral - Nectar Straw --  Oral - Thin Teaspoon Left anterior bolus loss  Oral - Thin Cup Left anterior bolus loss  Oral - Thin Straw --  Oral - Puree --  Oral - Mech Soft --  Oral - Regular --  Oral - Multi-Consistency --  Oral - Pill --  Oral Phase - Comment --    CHL IP PHARYNGEAL PHASE 09/08/2015  Pharyngeal Phase Impaired  Pharyngeal- Pudding Teaspoon --  Pharyngeal --  Pharyngeal- Pudding Cup --  Pharyngeal --  Pharyngeal- Honey Teaspoon --  Pharyngeal --  Pharyngeal- Honey Cup --  Pharyngeal --  Pharyngeal- Nectar Teaspoon --  Pharyngeal --  Pharyngeal- Nectar Cup --  Pharyngeal --  Pharyngeal- Nectar Straw --  Pharyngeal --  Pharyngeal- Thin Teaspoon Delayed swallow initiation-vallecula  Pharyngeal --  Pharyngeal- Thin Cup Delayed swallow initiation-vallecula;Penetration/Aspiration during swallow  Pharyngeal Material enters airway, remains ABOVE vocal cords and not ejected out  Pharyngeal- Thin Straw Delayed swallow  initiation-pyriform sinuses;Penetration/Aspiration during swallow  Pharyngeal Material enters airway, CONTACTS cords and then ejected out;Material enters airway, remains ABOVE vocal cords and not ejected out  Pharyngeal- Puree WFL  Pharyngeal --  Pharyngeal- Mechanical Soft --  Pharyngeal --  Pharyngeal- Regular --  Pharyngeal --  Pharyngeal- Multi-consistency --  Pharyngeal --  Pharyngeal- Pill --  Pharyngeal --  Pharyngeal Comment Characterized by delay with limited sensation     No flowsheet data found.  No flowsheet data found.  Rocky CraftsKara E Aldo Sondgeroth MA, CCC-SLP 09/08/2015, 4:40 PM

## 2015-09-09 ENCOUNTER — Inpatient Hospital Stay (HOSPITAL_COMMUNITY): Payer: 59 | Admitting: Speech Pathology

## 2015-09-09 LAB — BASIC METABOLIC PANEL
Anion gap: 11 (ref 5–15)
BUN: 43 mg/dL — ABNORMAL HIGH (ref 6–20)
CO2: 20 mmol/L — ABNORMAL LOW (ref 22–32)
Calcium: 9.9 mg/dL (ref 8.9–10.3)
Chloride: 95 mmol/L — ABNORMAL LOW (ref 101–111)
Creatinine, Ser: 1.15 mg/dL (ref 0.61–1.24)
GFR calc Af Amer: >60 mL/min (ref >60)
GFR calc non Af Amer: >60 mL/min (ref >60)
Glucose, Bld: 114 mg/dL — ABNORMAL HIGH (ref 65–99)
Potassium: 5.2 mmol/L — ABNORMAL HIGH (ref 3.5–5.1)
Sodium: 126 mmol/L — ABNORMAL LOW (ref 135–145)

## 2015-09-09 NOTE — Progress Notes (Signed)
Speech Language Pathology Weekly Progress and Session Note  Patient Details  Name: Brad Mcgee MRN: [id number] Date of Birth: [birth year]  Beginning of progress report period: September 02, 2015 End of progress report period: September 09, 2015  Today's Date: 09/09/2015 SLP Individual Time: 1130-1230 SLP Individual Time Calculation (min): 60 min  Short Term Goals: Week 1: SLP Short Term Goal 1 (Week 1): Pt will utilize an increased vocal intensity at the sentence level with Min A verbal cues to achieve 100% intelligibility.  SLP Short Term Goal 1 - Progress (Week 1): Met SLP Short Term Goal 2 (Week 1): Pt will perform diaphargmatic breathing exercises with min A verbal cues to maximize breath support for speech intelligibility and cough strength.  SLP Short Term Goal 2 - Progress (Week 1): Partly met SLP Short Term Goal 3 (Week 1): Pt will perform lingual, labial and mandibular resistance exercises to work towards improvoing oral motor strength for advancement of diet with Mod A verbal cues.  SLP Short Term Goal 3 - Progress (Week 1): Met SLP Short Term Goal 4 (Week 1): Pt will consume trials of dysphagia 2 diet without s/s of aspiration with Mod A  verbal cues for use of swallowing compensatory strategies.  SLP Short Term Goal 4 - Progress (Week 1): Met SLP Short Term Goal 5 (Week 1): Pt will consume trials of ice chips without s/s of aspiration with Min A verbal cues for use of swallowing compensatory strategies.  SLP Short Term Goal 5 - Progress (Week 1): Met SLP Short Term Goal 6 (Week 1): Pt will demonstrate functional problem solving for complex tasks with Min A verbal cues.  SLP Short Term Goal 6 - Progress (Week 1): Revised due to lack of progress    New Short Term Goals: Week 2: SLP Short Term Goal 1 (Week 2): Pt will demonstrate functional problem solving for moderately-complex tasks with Min A verbal cues.  SLP Short Term Goal 2 (Week 2): Pt will recall novel information with mod  A. SLP Short Term Goal 3 (Week 2): Pt demonstrate tolerance of current diet with adherence to compensatory strategies at mod I level as evidenced by no respiratory decline. SLP Short Term Goal 4 (Week 2): Pt complete functional math tasks with mod A. SLP Short Term Goal 5 (Week 2): Pt tolerate trials of Dys 3 consistency without signs of dysphagia to demonstrate readiness for diet upgrade.  Weekly Progress Updates:  Pt has demonstrated excellent progress as related to swallow function and as evidenced by FEES. Pt upgraded to Dys 2 diet with thin water only with strict adherence to aspiration precautions. Will continue to address dysphagia with increased focus on cognition moving forward.   Intensity: Minumum of 1-2 x/day, 30 to 90 minutes Frequency: 3 to 5 out of 7 days Duration/Length of Stay: 3 weeks Treatment/Interventions: Cognitive remediation/compensation;Dysphagia/aspiration precaution training;Patient/family education;Oral motor exercises;Speech/Language facilitation;Therapeutic Activities;Functional tasks;Cueing hierarchy;Therapeutic Exercise   Daily Session  Skilled Therapeutic Interventions: Pt able to tolerate Dys 2 diet with thins with intermittent cueing for small single sips. Pt with cough x 2 when he took a larger bolus and consecutive sips. Pt able to recall 50% of swallow strategies as previously established. He demonstrated some temporal confusion related to hospitalization and timeline was clarified. Pt required min verbal cueing to facilitate initiation of functional activities.      Function:   Eating Eating   Modified Consistency Diet: Yes Eating Assist Level: More than reasonable amount of time;Set up assist for;Supervision or  verbal cues   Eating Set Up Assist For: Opening containers       Cognition Comprehension Comprehension assist level: Understands basic 90% of the time/cues < 10% of the time  Expression   Expression assist level: Expresses basic 75 - 89%  of the time/requires cueing 10 - 24% of the time. Needs helper to occlude trach/needs to repeat words.  Social Interaction Social Interaction assist level: Interacts appropriately 75 - 89% of the time - Needs redirection for appropriate language or to initiate interaction.  Problem Solving Problem solving assist level: Solves basic 75 - 89% of the time/requires cueing 10 - 24% of the time  Memory Memory assist level: Recognizes or recalls 50 - 74% of the time/requires cueing 25 - 49% of the time   General    Pain Pain Assessment Pain Assessment: No/denies pain  Therapy/Group: Individual Therapy  Vinetta Bergamo MA, CCC-SLP 09/09/2015, 4:21 PM

## 2015-09-09 NOTE — Progress Notes (Signed)
[place] PHYSICAL MEDICINE & REHABILITATION     PROGRESS NOTE  Subjective/Complaints:  patient with CPAP mask on,, no issues overnight.  ROS: Denies CP, SOB, N/V/D.  Objective: Vital Signs: Blood pressure 134/76, pulse 65, temperature 98.5 F (36.9 C), temperature source Oral, resp. rate 18, height 5\' 8"  (1.727 m), weight 122.471 kg (270 lb), SpO2 97 %. No results found.  Recent Labs  09/08/15 0637  WBC 6.6  HGB 13.8  HCT 39.6  PLT 345    Recent Labs  09/08/15 0637 09/09/15 0614  NA 128* 126*  K 4.7 5.2*  CL 97* 95*  GLUCOSE 120* 114*  BUN 41* 43*  CREATININE 1.30* 1.15  CALCIUM 9.6 9.9   CBG (last 3)  No results for input(s): GLUCAP in the last 72 hours.  Wt Readings from Last 3 Encounters:  09/09/15 122.471 kg (270 lb)  09/01/15 126.3 kg (278 lb 7.1 oz)  08/01/15 135.172 kg (298 lb)    Physical Exam:  BP 134/76 mmHg  Pulse 65  Temp(Src) 98.5 F (36.9 C) (Oral)  Resp 18  Ht 5\' 8"  (1.727 m)  Wt 122.471 kg (270 lb)  BMI 41.06 kg/m2  SpO2 97% Constitutional: He appears well-developed and well-nourished. No distress.  Obese HENT: Normocephalic and atraumatic.  Eyes: Conjunctivae and EOM are normal.   Neck: Stoma healing.  Cardiovascular: Regular rate and rhythm.   Respiratory: Effort normal. No accessory muscle usage or stridor. No respiratory distress.   GI: Soft. Bowel sounds are normal. He exhibits no distension. There is no tenderness.  Musculoskeletal: He exhibits no edema or tenderness.  Neurological: He is alert and oriented x3.  Sensation intact to light touch.  Motor: RUE/RLE: 5/5 proximal to distal LUE: 0/5 proximally, wiggles fingers LLE: 3-/5 proximal to distal No increased tone noted.  Skin: Skin is warm and dry. He is not diaphoretic.  Psychiatric: His mood and behavior appear normal.    Assessment/Plan: 1. Functional deficits secondary to right basal ganglia hemorrhage which require 3+ hours per day of interdisciplinary  therapy in a comprehensive inpatient rehab setting. Physiatrist is providing close team supervision and 24 hour management of active medical problems listed below. Physiatrist and rehab team continue to assess barriers to discharge/monitor patient progress toward functional and medical goals.  Function:  Bathing Bathing position   Position: Wheelchair/chair at sink  Bathing parts Body parts bathed by patient: Left arm, Chest, Abdomen, Right upper leg, Left upper leg, Front perineal area, Buttocks Body parts bathed by helper: Right arm, Right lower leg, Left lower leg, Back  Bathing assist Assist Level: 2 helpers (used stedy)      Upper Body Dressing/Undressing Upper body dressing   What is the patient wearing?: Pull over shirt/dress     Pull over shirt/dress - Perfomed by patient: Thread/unthread right sleeve Pull over shirt/dress - Perfomed by helper: Thread/unthread left sleeve, Put head through opening, Pull shirt over trunk        Upper body assist Assist Level:  (max A)      Lower Body Dressing/Undressing Lower body dressing   What is the patient wearing?: Pants     Pants- Performed by patient: Thread/unthread right pants leg Pants- Performed by helper: Thread/unthread right pants leg, Thread/unthread left pants leg, Pull pants up/down, Fasten/unfasten pants   Non-skid slipper socks- Performed by helper: Don/doff left sock, Don/doff right sock                  Lower body assist Assist for  lower body dressing: 2 Audiological scientistHelpers      Toileting Toileting     Toileting steps completed by helper: Adjust clothing prior to toileting, Performs perineal hygiene, Adjust clothing after toileting Toileting Assistive Devices: Grab bar or rail  Toileting assist Assist level: Two helpers   Transfers Chair/bed transfer   Chair/bed transfer method: Lateral scoot Chair/bed transfer assist level: Maximal assist (Pt 25 - 49%/lift and lower) Chair/bed transfer assistive device:  Armrests, Walker Mechanical lift: Landscape architecttedy   Locomotion Ambulation Ambulation activity did not occur: Safety/medical concerns   Max distance: [phone]+50+50 Assist level: 2 helpers (max assist for gait, +2 for w/c follow)   Wheelchair   Type: Manual (TIS) Max wheelchair distance: 100 Assist Level: Dependent (Pt equals 0%)  Cognition Comprehension Comprehension assist level: Understands complex 90% of the time/cues 10% of the time  Expression Expression assist level: Expresses basic 75 - 89% of the time/requires cueing 10 - 24% of the time. Needs helper to occlude trach/needs to repeat words.  Social Interaction Social Interaction assist level: Interacts appropriately 75 - 89% of the time - Needs redirection for appropriate language or to initiate interaction.  Problem Solving Problem solving assist level: Solves basic 90% of the time/requires cueing < 10% of the time  Memory Memory assist level: Recognizes or recalls 90% of the time/requires cueing < 10% of the time     Medical Problem List and Plan: 1. Left hemiparesis, cognitive and functional deficitssecondary to right basal ganglia hemorrhage  Cont CIR  Resting hand splint, wrist cock up splint, PRAFO  Fluoxetine started 6/19 2. Left peroneal DVT/Anticoagulation: IVC filter placed on 06/16, Lovenox 60mg  daily. 3. Pain Management: tylenol prn.  4. Mood: LCSW to follow for evaluation and support.  5. Neuropsych: This patient is not fully capable of making decisions on his own behalf. 6. Skin/Wound Care: Routine pressure relief measures.  7. Fluids/Electrolytes/Nutrition: Monitor intake on dysphagia 1, nectar liquids. Advance diet as tolerated. 8. VDRF s/p trach: Decannulated on 6/21  CPAP daily at bedtime   9. Acute on chronic CHF: Likely due to hypertonic saline. Check daily weights and montor I/O.  10. Hx of malignant HTN: Monitor BP.   Cont meds for now 11. Leukocytosis due to MSSA bronchitis v/s PNA and Enterobacter  [id number]:Resolved    Cont to monitor temps and for other signs of infection. Completed rocephin 6/18. 12. ABLA: Resolved.   Hb 13.8 on 6/23 13. Enterobacter UTI: Completed rocephin 06/18.   Discontinued foley 14. Morbid obesity  Body mass index is 41.06 kg/(m^2).  Diet and exercise education  Cont to encourage weight loss to increase endurance and promote overall health 15. Hyponatremia  128 on 6/23,, 126 on 624  Will order labs for tomorrow,  continues to trend down will  DC thiazide.monitor blood pressure 16. AKI  Creatinine 1.30 on 6/23  Will encourage fluid intake   LOS (Days) 8 A FACE TO FACE EVALUATION WAS PERFORMED  Claudette LawsKIRSTEINS,ANDREW E 09/09/2015 11:08 AM

## 2015-09-10 ENCOUNTER — Inpatient Hospital Stay (HOSPITAL_COMMUNITY): Payer: 59 | Admitting: Physical Therapy

## 2015-09-10 NOTE — Progress Notes (Signed)
[place] PHYSICAL MEDICINE & REHABILITATION     PROGRESS NOTE  Subjective/Complaints:  patient with CPAP mask on,, no issues overnight.  ROS: Denies CP, SOB, N/V/D.  Objective: Vital Signs: Blood pressure 107/61, pulse 89, temperature 97.7 F (36.5 C), temperature source Oral, resp. rate 20, height 5\' 8"  (1.727 m), weight 120.2 kg (264 lb 15.9 oz), SpO2 99 %. No results found.  Recent Labs  09/08/15 0637  WBC 6.6  HGB 13.8  HCT 39.6  PLT 345    Recent Labs  09/08/15 0637 09/09/15 0614  NA 128* 126*  K 4.7 5.2*  CL 97* 95*  GLUCOSE 120* 114*  BUN 41* 43*  CREATININE 1.30* 1.15  CALCIUM 9.6 9.9   CBG (last 3)  No results for input(s): GLUCAP in the last 72 hours.  Wt Readings from Last 3 Encounters:  09/10/15 120.2 kg (264 lb 15.9 oz)  09/01/15 126.3 kg (278 lb 7.1 oz)  08/01/15 135.172 kg (298 lb)    Physical Exam:  BP 107/61 mmHg  Pulse 89  Temp(Src) 97.7 F (36.5 C) (Oral)  Resp 20  Ht 5\' 8"  (1.727 m)  Wt 120.2 kg (264 lb 15.9 oz)  BMI 40.30 kg/m2  SpO2 99% Constitutional: He appears well-developed and well-nourished. No distress.  Obese HENT: Normocephalic and atraumatic.  Eyes: Conjunctivae and EOM are normal.   Neck: Stoma healing.  Cardiovascular: Regular rate and rhythm.   Respiratory: Effort normal. No accessory muscle usage or stridor. No respiratory distress.   GI: Soft. Bowel sounds are normal. He exhibits no distension. There is no tenderness.  Musculoskeletal: He exhibits no edema or tenderness.  Neurological: He is alert and oriented x3.  Sensation intact to light touch.  Motor: RUE/RLE: 5/5 proximal to distal LUE:2-/5 proximally, protraction retraction, trace finger flexion LLE: 3-/5 proximal to distal No increased tone noted.  Skin: Skin is warm and dry. He is not diaphoretic.  Psychiatric: His mood and behavior appear normal.    Assessment/Plan: 1. Functional deficits secondary to right basal ganglia hemorrhage which  require 3+ hours per day of interdisciplinary therapy in a comprehensive inpatient rehab setting. Physiatrist is providing close team supervision and 24 hour management of active medical problems listed below. Physiatrist and rehab team continue to assess barriers to discharge/monitor patient progress toward functional and medical goals.  Function:  Bathing Bathing position   Position: Wheelchair/chair at sink  Bathing parts Body parts bathed by patient: Left arm, Chest, Abdomen, Right upper leg, Left upper leg, Front perineal area, Buttocks Body parts bathed by helper: Right arm, Right lower leg, Left lower leg, Back  Bathing assist Assist Level: 2 helpers (used stedy)      Upper Body Dressing/Undressing Upper body dressing   What is the patient wearing?: Pull over shirt/dress     Pull over shirt/dress - Perfomed by patient: Thread/unthread right sleeve Pull over shirt/dress - Perfomed by helper: Thread/unthread left sleeve, Put head through opening, Pull shirt over trunk        Upper body assist Assist Level:  (max A)      Lower Body Dressing/Undressing Lower body dressing   What is the patient wearing?: Pants     Pants- Performed by patient: Thread/unthread right pants leg Pants- Performed by helper: Thread/unthread right pants leg, Thread/unthread left pants leg, Pull pants up/down, Fasten/unfasten pants   Non-skid slipper socks- Performed by helper: Don/doff left sock, Don/doff right sock  Lower body assist Assist for lower body dressing: 2 Helpers      Toileting Toileting     Toileting steps completed by helper: Adjust clothing prior to toileting, Performs perineal hygiene, Adjust clothing after toileting Toileting Assistive Devices: Grab bar or rail  Toileting assist Assist level: Two helpers   Transfers Chair/bed transfer   Chair/bed transfer method: Squat pivot Chair/bed transfer assist level: Maximal assist (Pt 25 - 49%/lift and  lower) Chair/bed transfer assistive device: Armrests Mechanical lift: Stedy   Locomotion Ambulation Ambulation activity did not occur: Safety/medical concerns   Max distance: 40+30 Assist level: Moderate assist (Pt 50 - 74%)   Wheelchair   Type: Manual (TIS) Max wheelchair distance: 100 Assist Level: Dependent (Pt equals 0%)  Cognition Comprehension Comprehension assist level: Understands complex 90% of the time/cues 10% of the time  Expression Expression assist level: Expresses basic 75 - 89% of the time/requires cueing 10 - 24% of the time. Needs helper to occlude trach/needs to repeat words.  Social Interaction Social Interaction assist level: Interacts appropriately 75 - 89% of the time - Needs redirection for appropriate language or to initiate interaction.  Problem Solving Problem solving assist level: Solves basic 75 - 89% of the time/requires cueing 10 - 24% of the time  Memory Memory assist level: Recognizes or recalls 50 - 74% of the time/requires cueing 25 - 49% of the time     Medical Problem List and Plan: 1. Left hemiparesis, cognitive and functional deficitssecondary to right basal ganglia hemorrhage  Cont CIR, PTOT speech, making some improvements with left hemiparesis  Resting hand splint, wrist cock up splint, PRAFO  Fluoxetine started 6/19 2. Left peroneal DVT/Anticoagulation: IVC filter placed on 06/16, Lovenox 60mg  daily. 3. Pain Management: tylenol prn.  4. Mood: LCSW to follow for evaluation and support.  5. Neuropsych: This patient is not fully capable of making decisions on his own behalf. 6. Skin/Wound Care: Routine pressure relief measures.  7. Fluids/Electrolytes/Nutrition: Monitor intake on dysphagia 1, nectar liquids. Advance diet as tolerated. 8. VDRF s/p trach: Decannulated on 6/21  CPAP daily at bedtime   9. Acute on chronic CHF: Likely due to hypertonic saline. Check daily weights and montor I/O.  10. Hx of malignant HTN: Monitor BP.    Cont meds for now 11. Leukocytosis due to MSSA bronchitis v/s PNA and Enterobacter EAV:[id number]:Resolved    Cont to monitor temps and for other signs of infection. Completed rocephin 6/18. 12. ABLA: Resolved.   Hb 13.8 on 6/23 13. Enterobacter UTI: Completed rocephin 06/18.   Discontinued foley 14. Morbid obesity  Body mass index is 40.3 kg/(m^2).  Diet and exercise education  Cont to encourage weight loss to increase endurance and promote overall health 15. Hyponatremia  128 on 6/23,, 126 on 624  Will order labs for tomorrow,  continues to trend down will  DC thiazide diuretic.monitor blood pressure 16. AKI  Creatinine 1.30 on 6/23  Will encourage fluid intake   LOS (Days) 9 A FACE TO FACE EVALUATION WAS PERFORMED  Erick ColaceKIRSTEINS,ANDREW E 09/10/2015 11:43 AM

## 2015-09-10 NOTE — Plan of Care (Signed)
Problem: RH BLADDER ELIMINATION Goal: RH STG MANAGE BLADDER WITH ASSISTANCE STG Manage Bladder With mod Assistance  Outcome: Progressing Patient prefers to stand at the side of the bed to urinate needs 3 people to assist.

## 2015-09-10 NOTE — Progress Notes (Signed)
Physical Therapy Session Note  Patient Details  Name: Brad Mcgee MRN: [id number] Date of Birth: [birth year]  Today's Date: 09/10/2015 PT Individual Time: 0905-1005 PT Individual Time Calculation (min): 60 min   Short Term Goals: Week 2:  PT Short Term Goal 1 (Week 2): Pt will demonstrate bed mobility with mod assist using hospital bed functions PT Short Term Goal 2 (Week 2): Pt will transfer with mod assist and LRAD PT Short Term Goal 3 (Week 2): Pt will amb with mod assist and LRAD PT Short Term Goal 4 (Week 2): Pt will demonstrate static standing balance with mod assist PT Short Term Goal 5 (Week 2): Pt will tolerate sitting in regular w/c x2 hours  Skilled Therapeutic Interventions/Progress Updates:    Pt received resting in bed with no c/o pain, agreeable to therapy session.  Session focus on NMR via transfers, scooting, gait, and LE AROM/PROM.   PT donned pt's socks and shoes for time management.    Supine>sit with supervision using bed rails and HOB elevated with min verbal cues for sequencing.  Squat/pivot from EOB to w/c on R with max assist and min verbal cues for hand placement.  Pt continues to use momentum for squat/pivot resulting in uncontrolled descent to chair despite cues.    Gait training with hemiwalker and +1 mod assist, with pt's LUE draped over therapist's shoulder, with 1 turn to sit on therapy mat.  Occasional min verbal cues during gait for step length and walker positioning, max verbal cues for sequencing of turn and backing up to mat.  Gait training with rail in hallway therapist supporting only at pt's pelvis (not underneath LUE) with initial max assist for first 2 steps, progress to mod assist x30'.  Static standing balance with RUE support on rail and therapist providing support at pelvis and trunk ranging from min>mod assist.    NMR on therapy mat for bridging with isometric hip adduction (ball squeeze) x10 reps, and bridging with LLE support only 2x10 reps with  facilitation for quad activation and verbal cues for maintaining glute contraction.  Eccentric/concentric hip adduction focus on motor control and strength x5 reps in hook lying position.  PROM stretch to torso, hip ERs/abductors, hamstrings, and heel cord 3x30 seconds each.    Pt returned to room in w/c at end of session and positioned upright in w/c with call bell in reach and needs met.   Therapy Documentation Precautions:  Precautions Precautions: Fall Precaution Comments: watch BP, and O2 saturations on ATC Restrictions Weight Bearing Restrictions: No   See Function Navigator for Current Functional Status.   Therapy/Group: Individual Therapy  [name] 09/10/2015, 11:38 AM

## 2015-09-11 ENCOUNTER — Inpatient Hospital Stay (HOSPITAL_COMMUNITY): Payer: 59 | Admitting: Speech Pathology

## 2015-09-11 ENCOUNTER — Inpatient Hospital Stay (HOSPITAL_COMMUNITY): Payer: 59 | Admitting: Physical Therapy

## 2015-09-11 ENCOUNTER — Inpatient Hospital Stay (HOSPITAL_COMMUNITY): Payer: 59 | Admitting: Occupational Therapy

## 2015-09-11 NOTE — Progress Notes (Addendum)
[place] PHYSICAL MEDICINE & REHABILITATION     PROGRESS NOTE  Subjective/Complaints:  Patient seen sleeping in bed. He states he had a good weekend. He denies any breathing difficulties.  ROS: Denies CP, SOB, N/V/D.  Objective: Vital Signs: Blood pressure 102/63, pulse 98, temperature 98.4 F (36.9 C), temperature source Oral, resp. rate 18, height 5\' 8"  (1.727 m), weight 121.6 kg (268 lb 1.3 oz), SpO2 98 %. No results found. No results for input(s): WBC, HGB, HCT, PLT in the last 72 hours.  Recent Labs  09/09/15 0614  NA 126*  K 5.2*  CL 95*  GLUCOSE 114*  BUN 43*  CREATININE 1.15  CALCIUM 9.9   CBG (last 3)  No results for input(s): GLUCAP in the last 72 hours.  Wt Readings from Last 3 Encounters:  09/11/15 121.6 kg (268 lb 1.3 oz)  09/01/15 126.3 kg (278 lb 7.1 oz)  08/01/15 135.172 kg (298 lb)    Physical Exam:  BP 102/63 mmHg  Pulse 98  Temp(Src) 98.4 F (36.9 C) (Oral)  Resp 18  Ht 5\' 8"  (1.727 m)  Wt 121.6 kg (268 lb 1.3 oz)  BMI 40.77 kg/m2  SpO2 98% Constitutional: He appears well-developed and well-nourished. No distress.  Obese HENT: Normocephalic and atraumatic.  Eyes: Conjunctivae and EOM are normal.   Neck: Stoma healing.  Cardiovascular: Regular rate and rhythm.   Respiratory: Effort normal. No accessory muscle usage or stridor. No respiratory distress.   GI: Soft. Bowel sounds are normal. He exhibits no distension. There is no tenderness.  Musculoskeletal: He exhibits no edema or tenderness.  Neurological: He is alert and oriented x3.  Sensation intact to light touch.  Motor: RUE/RLE: 5/5 proximal to distal LUE: 2-/5 proximally, trace finger flexion LLE: 3/5 proximal to distal No increased tone noted.  Skin: Skin is warm and dry. He is not diaphoretic.  Psychiatric: His mood and behavior appear normal.    Assessment/Plan: 1. Functional deficits secondary to right basal ganglia hemorrhage which require 3+ hours per day of  interdisciplinary therapy in a comprehensive inpatient rehab setting. Physiatrist is providing close team supervision and 24 hour management of active medical problems listed below. Physiatrist and rehab team continue to assess barriers to discharge/monitor patient progress toward functional and medical goals.  Function:  Bathing Bathing position   Position: Wheelchair/chair at sink  Bathing parts Body parts bathed by patient: Left arm, Chest, Abdomen, Right upper leg, Left upper leg, Front perineal area, Buttocks Body parts bathed by helper: Right arm, Right lower leg, Left lower leg, Back  Bathing assist Assist Level: 2 helpers (used stedy)      Upper Body Dressing/Undressing Upper body dressing   What is the patient wearing?: Pull over shirt/dress     Pull over shirt/dress - Perfomed by patient: Thread/unthread right sleeve Pull over shirt/dress - Perfomed by helper: Thread/unthread left sleeve, Put head through opening, Pull shirt over trunk        Upper body assist Assist Level:  (max A)      Lower Body Dressing/Undressing Lower body dressing   What is the patient wearing?: Pants     Pants- Performed by patient: Thread/unthread right pants leg Pants- Performed by helper: Thread/unthread right pants leg, Thread/unthread left pants leg, Pull pants up/down, Fasten/unfasten pants   Non-skid slipper socks- Performed by helper: Don/doff left sock, Don/doff right sock                  Lower body assist Assist for  lower body dressing: 2 Audiological scientistHelpers      Toileting Toileting     Toileting steps completed by helper: Adjust clothing prior to toileting, Performs perineal hygiene, Adjust clothing after toileting Toileting Assistive Devices: Grab bar or rail  Toileting assist Assist level: Two helpers   Transfers Chair/bed transfer   Chair/bed transfer method: Squat pivot Chair/bed transfer assist level: Maximal assist (Pt 25 - 49%/lift and lower) Chair/bed transfer  assistive device: Armrests Mechanical lift: Stedy   Locomotion Ambulation Ambulation activity did not occur: Safety/medical concerns   Max distance: 40+30 Assist level: Moderate assist (Pt 50 - 74%)   Wheelchair   Type: Manual (TIS) Max wheelchair distance: 100 Assist Level: Dependent (Pt equals 0%)  Cognition Comprehension Comprehension assist level: Understands complex 90% of the time/cues 10% of the time  Expression Expression assist level: Expresses basic 75 - 89% of the time/requires cueing 10 - 24% of the time. Needs helper to occlude trach/needs to repeat words.  Social Interaction Social Interaction assist level: Interacts appropriately 75 - 89% of the time - Needs redirection for appropriate language or to initiate interaction.  Problem Solving Problem solving assist level: Solves basic 90% of the time/requires cueing < 10% of the time  Memory Memory assist level: Recognizes or recalls 90% of the time/requires cueing < 10% of the time     Medical Problem List and Plan: 1. Left hemiparesis, cognitive and functional deficitssecondary to right basal ganglia hemorrhage  Cont CIR  Resting hand splint, wrist cock up splint, PRAFO  Fluoxetine started 6/19 2. Left peroneal DVT/Anticoagulation: IVC filter placed on 06/16, Lovenox 60mg  daily. 3. Pain Management: tylenol prn.  4. Mood: LCSW to follow for evaluation and support.  5. Neuropsych: This patient is not fully capable of making decisions on his own behalf. 6. Skin/Wound Care: Routine pressure relief measures.  7. Fluids/Electrolytes/Nutrition: Monitor intake on dysphagia 1, nectar liquids. Advance diet as tolerated. 8. VDRF s/p trach: Decannulated on 6/21  CPAP daily at bedtime   9. Acute on chronic CHF: Likely due to hypertonic saline. Check daily weights and montor I/O.  10. Hx of malignant HTN: Monitor BP.   Cont meds for now 11. Leukocytosis due to MSSA bronchitis v/s PNA and Enterobacter [id number]:Resolved     Cont to monitor temps and for other signs of infection. Completed rocephin 6/18. 12. ABLA: Resolved.   Hb 13.8 on 6/23 13. Enterobacter UTI: Completed rocephin 06/18.   Discontinued foley 14. Morbid obesity  Body mass index is 40.77 kg/(m^2).  Diet and exercise education  Cont to encourage weight loss to increase endurance and promote overall health 15. Hyponatremia  128 on 6/23,, 126 on 624  Thiazide d/ced    Labs ordered for tomorrow 16. AKI  Creatinine 1.15 on 6/24 (improving)  Will encourage fluid intake   LOS (Days) 10 A FACE TO FACE EVALUATION WAS PERFORMED  Brad Mcgee Brad Mcgee 09/11/2015 9:47 AM

## 2015-09-11 NOTE — Progress Notes (Signed)
Physical Therapy Session Note  Patient Details  Name: Brad Mcgee MRN: [id number] Date of Birth: 07/22/1985  Today's Date: 09/11/2015 PT Individual Time: 1515-1630 PT Individual Time Calculation (min): 75 min   Short Term Goals: Week 2:  PT Short Term Goal 1 (Week 2): Pt will demonstrate bed mobility with mod assist using hospital bed functions PT Short Term Goal 2 (Week 2): Pt will transfer with mod assist and LRAD PT Short Term Goal 3 (Week 2): Pt will amb with mod assist and LRAD PT Short Term Goal 4 (Week 2): Pt will demonstrate static standing balance with mod assist PT Short Term Goal 5 (Week 2): Pt will tolerate sitting in regular w/c x2 hours  Skilled Therapeutic Interventions/Progress Updates:    Pt received in w/c with no c/o pain and agreeable to therapy session.  Session focus on gait training, standing balance, NMR, and patient education for d/c planning.    NMR via forced used for w/c propulsion with BLEs and mod assist (min assist when pt able to push with LEs rather than pull).  NMR on Nustep x10 minutes at level 4 for forced use of LUE/LLE, and reciprocal stepping pattern.  NMR through gait training x30' with rail and mod assist, x50' with hemiwalker and mod assist, and x63' with hemiwalker and mod assist.  Pt requires occasional verbal cues throughout gait training for upright/midline posture (tends to lean posteriorly with LLE swing through), step length, and BOS.  Pt able to progress with gait today with hemiwalker without LUE over therapist's shoulders.    PT discussed d/c location and provided pt with handout for measurements for doorways and heights of furniture and car seat.  Pt states his mother is already collecting measurements and can fill the sheet in when she comes to visit.  Also discussed possibility of a community reintegration outing in the next few weeks to work on community level mobility and problem solving.    PT instructed pt in stand/pivot x2 throughout  session to R and L with max assist overall and max multimodal cues for sequencing.  Pt has continued to require mod assist for squat/pivot to R and total for squat/pivot to L in previous sessions.    Pt returned to room and left upright in w/c with call bell in reach and needs met, RN in room as PT leaving.    Therapy Documentation Precautions:  Precautions Precautions: Fall Precaution Comments: watch BP, and O2 saturations on ATC Restrictions Weight Bearing Restrictions: No  See Function Navigator for Current Functional Status.   Therapy/Group: Individual Therapy  Caitlin E Penven-Crew 09/11/2015, 4:48 PM  

## 2015-09-11 NOTE — Progress Notes (Signed)
Occupational Therapy Weekly Progress Note  Patient Details  Name: Brad Mcgee MRN: [id number] Date of Birth: [birth year]  Beginning of progress report period: September 02, 2015 End of progress report period: September 11, 2015  Today's Date: 09/11/2015 OT Individual Time: 1300-1400 OT Individual Time Calculation (min): 60 min    Patient has met 4 of 4 short term goals.Pt continues to make progress this week towards occupational therapy goals. Pt currently has 2-/5 strength in L digits,wrist, and for supination/pronation. Pt can transfer to the R with mod A but continues to require total A to transfer to the L. Pt taking shower for the first time this week but use of STEDY for transfer secondary to safety. Pt continues to require min cues for hemiplegic dressing technique but continues to make progress. Pt continues to benefit from OT intervention.   Patient continues to demonstrate the following deficits: decreased I self care, functional transfers/mobility, balance, L UE/LE strength and AROM, cognition, and safety and therefore will continue to benefit from skilled OT intervention to enhance overall performance with BADL and iADL.  Patient progressing toward long term goals..  Continue plan of care.  OT Short Term Goals Week 1:  OT Short Term Goal 1 (Week 1): Pt will complete toilet transfer with max A OT Short Term Goal 1 - Progress (Week 1): Met OT Short Term Goal 2 (Week 1): Pt will dress UB with mod A OT Short Term Goal 2 - Progress (Week 1): Met OT Short Term Goal 3 (Week 1): Pt will carry over hemi-dressing technique during ADL session with min questioning cues OT Short Term Goal 3 - Progress (Week 1): Met OT Short Term Goal 4 (Week 1): Pt will maintain static sitting balance with mod A in prep for functional task OT Short Term Goal 4 - Progress (Week 1): Met Week 2:  OT Short Term Goal 1 (Week 2): Pt will complete toileting with the assist of 1 person in order to decrease level of assist  with self care. OT Short Term Goal 2 (Week 2): Pt will perform bathing from TTB with min A for sitting balance. OT Short Term Goal 3 (Week 2): Pt will perform LB dressing with assist of 1 person in order to decrease level of assist with self care.  OT Short Term Goal 4 (Week 2): Pt will perform sit <>stand from wheelchair at sink during self care tasks with mod A in order to decrease assist with functional mobility.  Skilled Therapeutic Interventions/Progress Updates:    Upon entering the room, pt supine in bed with mother present in the room and pt sleeping. Pt with no c/o pain this session and agreeable to OT intervention. Focus on self care retraining, functional transfers, and safety. OT transferred pt from bed to wheelchair towards the R with mod A needed. OT attempted squat pivot to the L to get into shower but pt unable. OT utilized STEDY with pt performing sit <>Stand from wheelchair into STEDY with mod A. Pt transferred on and off of TTB with steady for safety. Pt performed bathing tasks with min - mod A dynamic sitting balance. Pt returned to wheelchair at end of session. Pt donned elastic waist pants within stedy. RN called to change dressing for trach site. His mother present in room and showing pictures to OT regarding home set up. OT discussing need for home measurement sheet. Discharge planning education to continue. Pt remained seated in wheelchair with call bell and all needed items within reach  upon exiting the room.   Therapy Documentation Precautions:  Precautions Precautions: Fall Precaution Comments: watch BP, and O2 saturations on ATC Restrictions Weight Bearing Restrictions: No Pain: Pain Assessment Pain Assessment: No/denies pain  See Function Navigator for Current Functional Status.   Therapy/Group: Individual Therapy  Phineas Semen 09/11/2015, 2:25 PM

## 2015-09-11 NOTE — Progress Notes (Signed)
Speech Language Pathology Daily Session Note  Patient Details  Name: Brad Mcgee MRN: [id number] Date of Birth: 08/20/1985  Today's Date: 09/11/2015 SLP Individual Time: 1000-1100 SLP Individual Time Calculation (min): 60 min  Short Term Goals: Week 2: SLP Short Term Goal 1 (Week 2): Pt will demonstrate functional problem solving for moderately-complex tasks with Min A verbal cues.  SLP Short Term Goal 2 (Week 2): Pt will recall novel information with mod A. SLP Short Term Goal 3 (Week 2): Pt demonstrate tolerance of current diet with adherence to compensatory strategies at mod I level as evidenced by no respiratory decline. SLP Short Term Goal 4 (Week 2): Pt complete functional math tasks with mod A. SLP Short Term Goal 5 (Week 2): Pt tolerate trials of Dys 3 consistency without signs of dysphagia to demonstrate readiness for diet upgrade.  Skilled Therapeutic Interventions: Pt seen for skilled SLP therapy focusing on dysphagia and cognitive goals. Pt complains of upset stomach this morning, but was agreeable to snack. Pt able to tolerate thins via cup sip, crackers and yogurt  with only 1 episode of cough after thin. Functional money counting 100% acc (pt uses cash to pay all bills). Delayed recall of 3 items completed with 100% acc mod I. Functional math reasoning questions completed with intermittent min A and increased time.    Function:  Eating Eating   Modified Consistency Diet: Yes Eating Assist Level: More than reasonable amount of time;Set up assist for;Supervision or verbal cues   Eating Set Up Assist For: Opening containers       Cognition Comprehension Comprehension assist level: Understands complex 90% of the time/cues 10% of the time  Expression   Expression assist level: Expresses basic 75 - 89% of the time/requires cueing 10 - 24% of the time. Needs helper to occlude trach/needs to repeat words.  Social Interaction Social Interaction assist level: Interacts  appropriately 75 - 89% of the time - Needs redirection for appropriate language or to initiate interaction.  Problem Solving Problem solving assist level: Solves basic 90% of the time/requires cueing < 10% of the time  Memory Memory assist level: Recognizes or recalls 90% of the time/requires cueing < 10% of the time    Pain Pain Assessment Pain Assessment: No/denies pain  Therapy/Group: Individual Therapy  Rocky CraftsKara E Erika Slaby MA, CCC-SLP 09/11/2015, 10:55 AM

## 2015-09-12 ENCOUNTER — Inpatient Hospital Stay (HOSPITAL_COMMUNITY): Payer: 59 | Admitting: Physical Therapy

## 2015-09-12 ENCOUNTER — Inpatient Hospital Stay (HOSPITAL_COMMUNITY): Payer: 59 | Admitting: Occupational Therapy

## 2015-09-12 ENCOUNTER — Telehealth: Payer: Self-pay | Admitting: *Deleted

## 2015-09-12 ENCOUNTER — Inpatient Hospital Stay (HOSPITAL_COMMUNITY): Payer: 59 | Admitting: Speech Pathology

## 2015-09-12 LAB — BASIC METABOLIC PANEL
Anion gap: 11 (ref 5–15)
BUN: 32 mg/dL — ABNORMAL HIGH (ref 6–20)
CO2: 24 mmol/L (ref 22–32)
Calcium: 10.1 mg/dL (ref 8.9–10.3)
Chloride: 94 mmol/L — ABNORMAL LOW (ref 101–111)
Creatinine, Ser: 1.19 mg/dL (ref 0.61–1.24)
GFR calc Af Amer: >60 mL/min (ref >60)
GFR calc non Af Amer: >60 mL/min (ref >60)
Glucose, Bld: 103 mg/dL — ABNORMAL HIGH (ref 65–99)
Potassium: 4.7 mmol/L (ref 3.5–5.1)
Sodium: 129 mmol/L — ABNORMAL LOW (ref 135–145)

## 2015-09-12 NOTE — Progress Notes (Signed)
Physical Therapy Session Note  Patient Details  Name: Brad Mcgee MRN: [id number] Date of Birth: 09/12/1985  Today's Date: 09/12/2015 PT Individual Time: 1000-1100 PT Individual Time Calculation (min): 60 min   Short Term Goals: Week 2:  PT Short Term Goal 1 (Week 2): Pt will demonstrate bed mobility with mod assist using hospital bed functions PT Short Term Goal 2 (Week 2): Pt will transfer with mod assist and LRAD PT Short Term Goal 3 (Week 2): Pt will amb with mod assist and LRAD PT Short Term Goal 4 (Week 2): Pt will demonstrate static standing balance with mod assist PT Short Term Goal 5 (Week 2): Pt will tolerate sitting in regular w/c x2 hours  Skilled Therapeutic Interventions/Progress Updates:   Pt received in tilt in space w/c with family present.  No c/o pain.  Discussed with pt importance of transitioning gradually to manual w/c for increased activation of trunk for postural control and balance; pt reports he can't sit up too long without tilting due to pressure and he tried a manual w/c yesterday but it was too small and he kept sliding forwards.  Obtained another 20 in wide w/c with deeper seat base and more supportive back rest; pt transferred from chair > manual w/c with lateral stepping with hemi walker and mod-max A for trunk control and balance with verbal cues for sequencing and attention to L foot placement.  Once in w/c, back rest adjusted slightly and shorter cushion added to manual w/c for more appropriate STF height.  Did not attempt w/c mobility today due to fatigue at end of session but will f/u tomorrow.  In gym pt engaged in stand pivot transfer training w/c <> Mat and manual w/c <> tilt in space w/c to L and R with R hemi walker and therapist on L side providing support and positioning to LUE and trunk/head, weight shifting and verbal cues for safe sequence; mod A to R, max A to L.  Also performed NMR with gait and stair negotiation; see below for details.  Following  gait and stairs pt noted to be diaphoretic and emotionally labile.  Vitals assessed with BP: 127/94, 106<>124 bpm.  Pt given extra time to rest while therapist setting up manual w/c.  At end of session pt left in tilt in space w/c with head rest adjusted to keep head in midline.    Therapy Documentation Precautions:  Precautions Precautions: Fall Precaution Comments: watch BP, and O2 saturations on ATC Restrictions Weight Bearing Restrictions: No Vital Signs: Therapy Vitals Temp: 97.8 F (36.6 C) Temp Source: Oral Pulse Rate: 93 Resp: 18 BP: 111/66 mmHg Patient Position (if appropriate): Lying Oxygen Therapy SpO2: 100 % O2 Device: Not Delivered Pain: Pain Assessment Pain Assessment: No/denies pain Other Treatments: Treatments Neuromuscular Facilitation: Left;Lower Extremity;Forced use;Activity to increase coordination;Activity to increase motor control;Activity to increase timing and sequencing;Activity to increase grading;Activity to increase sustained activation;Activity to increase lateral weight shifting;Activity to increase anterior-posterior weight shifting during gait x 75' in controlled environment with R hemi walker with therapist on L side providing LUE and trunk support, facilitation of lateral weight shifting, pelvic rotation on L side and verbal cues for head position, sequencing and L foot placement.  Also performed NMR during stair negotiation with max A with RUE support on rail with pt performing step to sequencing with focus on lateral and anterior weight shifting, sequencing of stepping, stance phase activation and postural control on LLE and LLE advancement and placement.  See Function Navigator for Current Functional Status.   Therapy/Group: Individual Therapy  Brad Mcgee, Brad Mcgee 09/12/2015, 3:51 PM

## 2015-09-12 NOTE — Telephone Encounter (Signed)
[facility] [employer] form on [employer] desk.

## 2015-09-12 NOTE — Progress Notes (Signed)
Occupational Therapy Session Note  Patient Details  Name: Brad Mcgee MRN: [id number] Date of Birth: 08/15/1985  Today's Date: 09/12/2015 OT Individual Time: 0700-0800 OT Individual Time Calculation (min): 60 min    Short Term Goals: Week 2:  OT Short Term Goal 1 (Week 2): Pt will complete toileting with the assist of 1 person in order to decrease level of assist with self care. OT Short Term Goal 2 (Week 2): Pt will perform bathing from TTB with min A for sitting balance. OT Short Term Goal 3 (Week 2): Pt will perform LB dressing with assist of 1 person in order to decrease level of assist with self care.  OT Short Term Goal 4 (Week 2): Pt will perform sit <>stand from wheelchair at sink during self care tasks with mod A in order to decrease assist with functional mobility.  Skilled Therapeutic Interventions/Progress Updates:    Upon entering the room, pt sleeping in bed but agreeable to OT intervention. Pt donned LB clothing while supine in bed. Pt able to thread R LE into pants and rolling L <> R with supervision and use of bed rails in order for therapist to assist with pulling over B hips. Pt performed supine >sit with supervision as well and use of bed rails coming to EOB. Pt performed stand pivot transfer with mod A to wheelchair towards the R. OT propelled pt's wheelchair to sink for UB dressing and grooming at sink. Pt performing grooming with supervision and use of one handed techniques. Pt able to doff dirty shirt and don clean one with min verbal cues for hemiplegic technique. Pt needing assist to thread L UE and cues to utilize mirror in order to pull shirt down as needed. OT reviewing pts progress towards OT goals and discussed how sessions will continue to progress. Quick release belt donned and call bell within reach.   Therapy Documentation Precautions:  Precautions Precautions: Fall Precaution Comments: watch BP, and O2 saturations on ATC Restrictions Weight Bearing  Restrictions: No Vital Signs: Therapy Vitals Temp: 98.3 F (36.8 C) Temp Source: Oral Pulse Rate: 98 Resp: 18 BP: 118/68 mmHg Patient Position (if appropriate): Lying Oxygen Therapy SpO2: 99 % O2 Device: Not Delivered Pain: Pain Assessment Pain Assessment: No/denies pain  See Function Navigator for Current Functional Status.   Therapy/Group: Individual Therapy  Lowella Gripittman, Ronell Duffus L 09/12/2015, 9:47 AM

## 2015-09-12 NOTE — Progress Notes (Signed)
Recreational Therapy Session Note  Patient Details  Name: Brad Mcgee MRN: [id number] Date of Birth: 04/21/1985 Today's Date: 09/12/2015  Pain: c/o LLE pain after ambulation, rest provided relief Skilled Therapeutic Interventions/Progress Updates: Session focused on ambulation using HW, ascending & descending stairs, and leisure discussion during co-treat with PT.  Pt required +2 assist and instructional cues for safety during ambulation and stair climbing. Pt labile during session especially during leisure discussion.  Pt's father present at end of session and observing stair negotiation.   Therapy/Group: Co-Treatment  Brad Mcgee 09/12/2015, 12:55 PM

## 2015-09-12 NOTE — Progress Notes (Signed)
Speech Language Pathology Daily Session Note  Patient Details  Name: Brad Mcgee M Lohmann MRN: [id number] Date of Birth: 01/17/1986  Today's Date: 09/12/2015 SLP Individual Time: 0900-1000 SLP Individual Time Calculation (min): 60 min  Short Term Goals:  Week 2: SLP Short Term Goal 1 (Week 2): Pt will demonstrate functional problem solving for moderately-complex tasks with Min A verbal cues.  SLP Short Term Goal 2 (Week 2): Pt will recall novel information with mod A. SLP Short Term Goal 3 (Week 2): Pt demonstrate tolerance of current diet with adherence to compensatory strategies at mod I level as evidenced by no respiratory decline. SLP Short Term Goal 4 (Week 2): Pt complete functional math tasks with mod A. SLP Short Term Goal 5 (Week 2): Pt tolerate trials of Dys 3 consistency without signs of dysphagia to demonstrate readiness for diet upgrade.  Skilled Therapeutic Interventions: Pt seen for skilled SLP therapy focusing on dysphagia and cognitive goals.  Pt able to tolerate thins via cup sip, crackers and yogurt  with only 1 episode of throat clear after thin.  Vocal quality remained clear. Functional math reasoning word problems completed with 90% acc mod I for increased time.    Function:  Eating Eating   Modified Consistency Diet: Yes Eating Assist Level: More than reasonable amount of time;Set up assist for;Supervision or verbal cues   Eating Set Up Assist For: Opening containers       Cognition Comprehension Comprehension assist level: Understands basic 90% of the time/cues < 10% of the time  Expression   Expression assist level: Expresses basic 75 - 89% of the time/requires cueing 10 - 24% of the time. Needs helper to occlude trach/needs to repeat words.  Social Interaction Social Interaction assist level: Interacts appropriately 90% of the time - Needs monitoring or encouragement for participation or interaction.  Problem Solving Problem solving assist level: Solves basic 75  - 89% of the time/requires cueing 10 - 24% of the time  Memory Memory assist level: Recognizes or recalls 75 - 89% of the time/requires cueing 10 - 24% of the time    Pain Pain Assessment Pain Assessment: No/denies pain  Therapy/Group: Individual Therapy  Rocky CraftsKara E Tyna Huertas MA, CCC-SLP 09/12/2015, 3:50 PM

## 2015-09-12 NOTE — Progress Notes (Signed)
[place] PHYSICAL MEDICINE & REHABILITATION     PROGRESS NOTE  Subjective/Complaints:  Pt returning from therapies.  Slept fairly overnight.  Notices improvement in strength in LUE.    ROS: Denies CP, SOB, N/V/D.  Objective: Vital Signs: Blood pressure 118/68, pulse 98, temperature 98.3 F (36.8 C), temperature source Oral, resp. rate 18, height 5\' 8"  (1.727 m), weight 122.1 kg (269 lb 2.9 oz), SpO2 99 %. No results found. No results for input(s): WBC, HGB, HCT, PLT in the last 72 hours.  Recent Labs  09/12/15 0629  NA 129*  K 4.7  CL 94*  GLUCOSE 103*  BUN 32*  CREATININE 1.19  CALCIUM 10.1   CBG (last 3)  No results for input(s): GLUCAP in the last 72 hours.  Wt Readings from Last 3 Encounters:  09/12/15 122.1 kg (269 lb 2.9 oz)  09/01/15 126.3 kg (278 lb 7.1 oz)  08/01/15 135.172 kg (298 lb)    Physical Exam:  BP 118/68 mmHg  Pulse 98  Temp(Src) 98.3 F (36.8 C) (Oral)  Resp 18  Ht 5\' 8"  (1.727 m)  Wt 122.1 kg (269 lb 2.9 oz)  BMI 40.94 kg/m2  SpO2 99% Constitutional: He appears well-developed and well-nourished. No distress.  Obese HENT: Normocephalic and atraumatic.  Eyes: Conjunctivae and EOM are normal.   Neck: Stoma healing.  Cardiovascular: Regular rate and rhythm.   Respiratory: Effort normal. No accessory muscle usage or stridor. No respiratory distress.   GI: Soft. Bowel sounds are normal. He exhibits no distension. There is no tenderness.  Musculoskeletal: He exhibits no edema or tenderness.  Neurological: He is alert and oriented x3.  Sensation intact to light touch.  Motor: RUE/RLE: 5/5 proximal to distal LUE: 2-/5 shoulder abduction, 3-/5 elbow flexion/extension, 3/5 finger grip LLE: 4-/5 proximal to distal No increased tone noted.  Skin: Skin is warm and dry. He is not diaphoretic.  Psychiatric: His mood and behavior appear normal.    Assessment/Plan: 1. Functional deficits secondary to right basal ganglia hemorrhage which  require 3+ hours per day of interdisciplinary therapy in a comprehensive inpatient rehab setting. Physiatrist is providing close team supervision and 24 hour management of active medical problems listed below. Physiatrist and rehab team continue to assess barriers to discharge/monitor patient progress toward functional and medical goals.  Function:  Bathing Bathing position   Position: Shower  Bathing parts Body parts bathed by patient: Left arm, Chest, Abdomen, Right upper leg, Left upper leg, Front perineal area, Buttocks Body parts bathed by helper: Right arm, Right lower leg, Left lower leg, Back, Buttocks  Bathing assist Assist Level:  (mod a)      Upper Body Dressing/Undressing Upper body dressing   What is the patient wearing?: Pull over shirt/dress     Pull over shirt/dress - Perfomed by patient: Thread/unthread right sleeve, Put head through opening, Pull shirt over trunk Pull over shirt/dress - Perfomed by helper: Thread/unthread left sleeve        Upper body assist Assist Level: Touching or steadying assistance(Pt > 75%)      Lower Body Dressing/Undressing Lower body dressing   What is the patient wearing?: Pants, Socks, Shoes     Pants- Performed by patient: Thread/unthread right pants leg Pants- Performed by helper: Thread/unthread left pants leg, Pull pants up/down   Non-skid slipper socks- Performed by helper: Don/doff left sock, Don/doff right sock   Socks - Performed by helper: Don/doff right sock, Don/doff left sock   Shoes - Performed by helper: Don/doff  right shoe, Don/doff left shoe, Fasten right, Fasten left          Lower body assist Assist for lower body dressing:  (total A)      Toileting Toileting     Toileting steps completed by helper: Adjust clothing prior to toileting, Performs perineal hygiene, Adjust clothing after toileting Toileting Assistive Devices: Grab bar or rail  Toileting assist Assist level: Two helpers    Transfers Chair/bed transfer   Chair/bed transfer method: Stand pivot Chair/bed transfer assist level: Maximal assist (Pt 25 - 49%/lift and lower) Chair/bed transfer assistive device: Armrests Mechanical lift: Stedy   Locomotion Ambulation Ambulation activity did not occur: Safety/medical concerns   Max distance: 63 Assist level: Moderate assist (Pt 50 - 74%)   Wheelchair   Type: Manual Max wheelchair distance: 100 Assist Level: Touching or steadying assistance (Pt > 75%)  Cognition Comprehension Comprehension assist level: Understands basic 90% of the time/cues < 10% of the time  Expression Expression assist level: Expresses basic 75 - 89% of the time/requires cueing 10 - 24% of the time. Needs helper to occlude trach/needs to repeat words.  Social Interaction Social Interaction assist level: Interacts appropriately 75 - 89% of the time - Needs redirection for appropriate language or to initiate interaction.  Problem Solving Problem solving assist level: Solves basic 75 - 89% of the time/requires cueing 10 - 24% of the time  Memory Memory assist level: Recognizes or recalls 75 - 89% of the time/requires cueing 10 - 24% of the time     Medical Problem List and Plan: 1. Left hemiparesis, cognitive and functional deficitssecondary to right basal ganglia hemorrhage  Cont CIR  Resting hand splint, wrist cock up splint, PRAFO  Fluoxetine started 6/19 2. Left peroneal DVT/Anticoagulation: IVC filter placed on 06/16, Lovenox 60mg  daily. 3. Pain Management: tylenol prn.  4. Mood: LCSW to follow for evaluation and support.  5. Neuropsych: This patient is not fully capable of making decisions on his own behalf. 6. Skin/Wound Care: Routine pressure relief measures.  7. Fluids/Electrolytes/Nutrition: Monitor intake on dysphagia 1, nectar liquids. Advance diet as tolerated. 8. VDRF s/p trach: Decannulated on 6/21  CPAP daily at bedtime   9. Acute on chronic CHF: Likely due to  hypertonic saline. Check daily weights and montor I/O.  10. Hx of malignant HTN: Monitor BP.   Cont meds for now 11. Leukocytosis due to MSSA bronchitis v/s PNA and Enterobacter ZOX:WRUEAVWUTI:Resolved    Cont to monitor temps and for other signs of infection. Completed rocephin 6/18. 12. ABLA: Resolved.   Hb 13.8 on 6/23 13. Enterobacter UTI: Completed rocephin 06/18.   Discontinued foley 14. Morbid obesity  Body mass index is 40.94 kg/(m^2).  Diet and exercise education  Cont to encourage weight loss to increase endurance and promote overall health 15. Hyponatremia  129 on 6/27  Thiazide d/ced    Stable/improving, will cont to monitor 16. AKI  Creatinine 1.19 on 6/24   Will encourage fluid intake   LOS (Days) 11 A FACE TO FACE EVALUATION WAS PERFORMED  Ankit Karis Jubanil Patel 09/12/2015 10:14 AM

## 2015-09-13 ENCOUNTER — Inpatient Hospital Stay (HOSPITAL_COMMUNITY): Payer: 59 | Admitting: Occupational Therapy

## 2015-09-13 ENCOUNTER — Inpatient Hospital Stay (HOSPITAL_COMMUNITY): Payer: 59 | Admitting: Physical Therapy

## 2015-09-13 ENCOUNTER — Inpatient Hospital Stay (HOSPITAL_COMMUNITY): Payer: 59 | Admitting: Speech Pathology

## 2015-09-13 NOTE — Progress Notes (Signed)
Physical Therapy Session Note  Patient Details  Name: Brad Mcgee MRN: [id number] Date of Birth: Mar 16, 1986  Today's Date: 09/13/2015 PT Individual Time: 1130-1210 PT Individual Time Calculation (min): 40 min   Short Term Goals: Week 2:  PT Short Term Goal 1 (Week 2): Pt will demonstrate bed mobility with mod assist using hospital bed functions PT Short Term Goal 2 (Week 2): Pt will transfer with mod assist and LRAD PT Short Term Goal 3 (Week 2): Pt will amb with mod assist and LRAD PT Short Term Goal 4 (Week 2): Pt will demonstrate static standing balance with mod assist PT Short Term Goal 5 (Week 2): Pt will tolerate sitting in regular w/c x2 hours  Skilled Therapeutic Interventions/Progress Updates:    Pt received resting in tilt in space w/c with no c/o pain and agreeable to therapy session.  PT provided education regarding progression to regular w/c and pt verbalized understanding.  Session focus on LLE NMR, standing balance, transfers, and gait with hemiwalker.    PT instructed pt in car transfer with hemiwalker, stand/pivot w/c<>car.  Pt required max assist to pivot to L side to get into car with max multimodal cues for gait pattern and walker placement.  Pt required mod assist to exit car to R side with min cues for walker placement and sequencing of LEs.  PT discussed ideal car height for pt to d/c home with, reports his sister drives a [employer] or a [employer] M[employer]in but his father drives large trucks.  PT explained safety concerns with pt and his mother regarding attempting a transfer to a taller vehicle and both verbalized understanding.    PT instructed pt in LLE NMR via rapid stepping (fowrard and back) with LLE to fatigue with mod/max assist and hemiwalker to maintain balance.  Gait training x50' with hemiwalker and mod assist, pt reporting fatigue and pain in LLE and does not wish to continue, but asking to use the bathroom.   PT assisted pt into standing and provided overall mod assist for  balance as pt managed clothing and urinal for voiding.  Pt returned to w/c at end of session and set up with lunch tray.  Call bell in reach and needs met.   Therapy Documentation Precautions:  Precautions Precautions: Fall Precaution Comments: watch BP, and O2 saturations on ATC Restrictions Weight Bearing Restrictions: No     See Function Navigator for Current Functional Status.   Therapy/Group: Individual Therapy  [name] 09/13/2015, 12:26 PM

## 2015-09-13 NOTE — Progress Notes (Signed)
[facility] PHYSICAL MEDICINE & REHABILITATION     PROGRESS NOTE  Subjective/Complaints:  Pt ambulating with family with hemiwalker.  He is sleeping fairly.  His phonation is improving.     ROS: Denies CP, SOB, N/V/D.  Objective: Vital Signs: Blood pressure 124/61, pulse 86, temperature 97.7 F (36.5 C), temperature source Axillary, resp. rate 18, height 5\' 8"  (1.727 m), weight 117.8 kg (259 lb 11.2 oz), SpO2 99 %. No results found. No results for input(s): WBC, HGB, HCT, PLT in the last 72 hours.  Recent Labs  09/12/15 0629  NA 129*  K 4.7  CL 94*  GLUCOSE 103*  BUN 32*  CREATININE 1.19  CALCIUM 10.1   CBG (last 3)  No results for input(s): GLUCAP in the last 72 hours.  Wt Readings from Last 3 Encounters:  09/13/15 117.8 kg (259 lb 11.2 oz)  09/01/15 126.3 kg (278 lb 7.1 oz)  08/01/15 135.172 kg (298 lb)    Physical Exam:  BP 124/61 mmHg  Pulse 86  Temp(Src) 97.7 F (36.5 C) (Axillary)  Resp 18  Ht 5\' 8"  (1.727 m)  Wt 117.8 kg (259 lb 11.2 oz)  BMI 39.50 kg/m2  SpO2 99% Constitutional: He appears well-developed and well-nourished. No distress.  Obese HENT: Normocephalic and atraumatic.  Eyes: Conjunctivae and EOM are normal.   Neck: Stoma healing.  Cardiovascular: Regular rate and rhythm.   Respiratory: Effort normal. No accessory muscle usage or stridor. No respiratory distress.   GI: Soft. Bowel sounds are normal. He exhibits no distension. There is no tenderness.  Musculoskeletal: He exhibits no edema or tenderness.  Neurological: He is alert and oriented x3.  Sensation intact to light touch.  Motor: RUE/RLE: 5/5 proximal to distal LUE: 2-/5 shoulder abduction, 3-/5 elbow flexion/extension, 3/5 finger grip LLE: 4-/5 proximal to distal No increased tone noted.  Skin: Skin is warm and dry. He is not diaphoretic.  Psychiatric: His mood and behavior appear normal.    Assessment/Plan: 1. Functional deficits secondary to right basal ganglia hemorrhage  which require 3+ hours per day of interdisciplinary therapy in a comprehensive inpatient rehab setting. Physiatrist is providing close team supervision and 24 hour management of active medical problems listed below. Physiatrist and rehab team continue to assess barriers to discharge/monitor patient progress toward functional and medical goals.  Function:  Bathing Bathing position   Position: Shower  Bathing parts Body parts bathed by patient: Left arm, Chest, Abdomen, Right upper leg, Left upper leg, Front perineal area, Buttocks Body parts bathed by helper: Right arm, Right lower leg, Left lower leg, Back, Buttocks  Bathing assist Assist Level:  (mod a)      Upper Body Dressing/Undressing Upper body dressing   What is the patient wearing?: Pull over shirt/dress     Pull over shirt/dress - Perfomed by patient: Thread/unthread right sleeve, Put head through opening, Pull shirt over trunk Pull over shirt/dress - Perfomed by helper: Thread/unthread left sleeve        Upper body assist Assist Level: Touching or steadying assistance(Pt > 75%)      Lower Body Dressing/Undressing Lower body dressing   What is the patient wearing?: Pants, Socks, Shoes     Pants- Performed by patient: Thread/unthread right pants leg Pants- Performed by helper: Thread/unthread left pants leg, Pull pants up/down   Non-skid slipper socks- Performed by helper: Don/doff left sock, Don/doff right sock   Socks - Performed by helper: Don/doff right sock, Don/doff left sock   Shoes - Performed by  helper: Don/doff right shoe, Don/doff left shoe, Fasten right, Fasten left          Lower body assist Assist for lower body dressing:  (total A)      Toileting Toileting     Toileting steps completed by helper: Adjust clothing prior to toileting, Performs perineal hygiene, Adjust clothing after toileting Toileting Assistive Devices: Grab bar or rail  Toileting assist Assist level: Set up/obtain supplies,  Touching or steadying assistance (Pt.75%)   Transfers Chair/bed transfer   Chair/bed transfer method: Stand pivot Chair/bed transfer assist level: Maximal assist (Pt 25 - 49%/lift and lower) Chair/bed transfer assistive device: Mechanical lift Mechanical lift: Stedy   Locomotion Ambulation Ambulation activity did not occur: Safety/medical concerns   Max distance: 75 Assist level: Moderate assist (Pt 50 - 74%)   Wheelchair   Type: Manual Max wheelchair distance: 100 Assist Level: Touching or steadying assistance (Pt > 75%)  Cognition Comprehension Comprehension assist level: Understands complex 90% of the time/cues 10% of the time  Expression Expression assist level: Expresses complex 90% of the time/cues < 10% of the time  Social Interaction Social Interaction assist level: Interacts appropriately 90% of the time - Needs monitoring or encouragement for participation or interaction.  Problem Solving Problem solving assist level: Solves complex 90% of the time/cues < 10% of the time  Memory Memory assist level: Recognizes or recalls 90% of the time/requires cueing < 10% of the time     Medical Problem List and Plan: 1. Left hemiparesis, cognitive and functional deficitssecondary to right basal ganglia hemorrhage  Cont CIR  Resting hand splint, wrist cock up splint, PRAFO  Fluoxetine started 6/19 2. Left peroneal DVT/Anticoagulation: IVC filter placed on 06/16, Lovenox 60mg  daily. 3. Pain Management: tylenol prn.  4. Mood: LCSW to follow for evaluation and support.  5. Neuropsych: This patient is not fully capable of making decisions on his own behalf. 6. Skin/Wound Care: Routine pressure relief measures.  7. Fluids/Electrolytes/Nutrition: Monitor intake on dysphagia 1, nectar liquids. Advance diet as tolerated. 8. VDRF s/p trach: Decannulated on 6/21  CPAP daily at bedtime   9. Acute on chronic CHF: Likely due to hypertonic saline. Check daily weights and montor I/O.   10. Hx of malignant HTN: Monitor BP.   Cont meds for now 11. Leukocytosis due to MSSA bronchitis v/s PNA and Enterobacter ZOX:WRUEAVWUTI:Resolved    Cont to monitor temps and for other signs of infection. Completed rocephin 6/18. 12. ABLA: Resolved.   Hb 13.8 on 6/23 13. Enterobacter UTI: Completed rocephin 06/18.   Discontinued foley 14. Morbid obesity  Body mass index is 39.5 kg/(m^2).  Diet and exercise education  Cont to encourage weight loss to increase endurance and promote overall health 15. Hyponatremia  129 on 6/27  Thiazide d/ced    Stable/improving, will cont to monitor 16. AKI  Creatinine 1.19 on 6/24   Will encourage fluid intake  LOS (Days) 12 A FACE TO FACE EVALUATION WAS PERFORMED  Sterling Mondo Karis Jubanil Karenna Romanoff 09/13/2015 9:15 AM

## 2015-09-13 NOTE — Progress Notes (Signed)
Occupational Therapy Session Note  Patient Details  Name: Brad Mcgee MRN: [id number] Date of Birth: 07/17/1985  Today's Date: 09/13/2015 OT Individual Time: [id number] OT Individual Time Calculation (min): 58 min    Short Term Goals: Week 2:  OT Short Term Goal 1 (Week 2): Pt will complete toileting with the assist of 1 person in order to decrease level of assist with self care. OT Short Term Goal 2 (Week 2): Pt will perform bathing from TTB with min A for sitting balance. OT Short Term Goal 3 (Week 2): Pt will perform LB dressing with assist of 1 person in order to decrease level of assist with self care.  OT Short Term Goal 4 (Week 2): Pt will perform sit <>stand from wheelchair at sink during self care tasks with mod A in order to decrease assist with functional mobility.  Skilled Therapeutic Interventions/Progress Updates:    Upon entering the room, pt seated in wheelchair with mother and girlfriend present in the room. His mother reports pt has already washed this morning and upon further questions she reports that she ambulated the pt to bathroom earlier in the morning. OT provided education to caregiver and pt regarding safety and caregiver not being trained for this task. OT and PT are not recommending pt to be ambulated by family even after discharge at this time secondary to fall risk and safety concerns. Caregiver verbalized understanding. Pt performed grooming tasks at sink with supervision while seated in wheelchair. OT propelled pt into dayroom. Session focus on sit <>stand, weightbearing through L UE, and weight shifting. Pt standing for 6 min and then 5 minutes respectively with pt blocking L knee secondary to buckling. OT also holding L UE on table top while pt engaged in table top card activity. Pt reaching in all directions for tasks with min cues for midline and weight shifting. Pt returned to wheelchair and returned to room with mother remaining with pt. Call bell and all  needed items within reach upon exiting the room.    Therapy Documentation Precautions:  Precautions Precautions: Fall Precaution Comments: watch BP, and O2 saturations on ATC Restrictions Weight Bearing Restrictions: No Pain: Pain Assessment Pain Assessment: No/denies pain Pain Score: 0-No pain  See Function Navigator for Current Functional Status.   Therapy/Group: Individual Therapy  Lowella Gripittman, Kanyon Bunn L 09/13/2015, 11:14 AM

## 2015-09-13 NOTE — Progress Notes (Signed)
Social Work Patient ID: [name], male   DOB: 02/08/1986, 30 y.o.   MRN: [id number]  Have reviewed team conference with pt and mother.  Both aware that team continues to aim for 7/12 d/c date and min assist goals.  Both very pleased with progress pt is making and family remains very committed to providing 24/7 assistance.  Continue to follow.  Shayna Eblen, LCSW

## 2015-09-13 NOTE — Progress Notes (Signed)
Patient was already on cpap prior to arrival.

## 2015-09-13 NOTE — Progress Notes (Signed)
Speech Language Pathology Daily Session Note  Patient Details  Name: Brad Mcgee MRN: [id number] Date of Birth: 09/05/1985  Today's Date: 09/13/2015 SLP Individual Time: 0800-0900 SLP Individual Time Calculation (min): 60 min  Short Term Goals: Week 2: SLP Short Term Goal 1 (Week 2): Pt will demonstrate functional problem solving for moderately-complex tasks with Min A verbal cues.  SLP Short Term Goal 2 (Week 2): Pt will recall novel information with mod A. SLP Short Term Goal 3 (Week 2): Pt demonstrate tolerance of current diet with adherence to compensatory strategies at mod I level as evidenced by no respiratory decline. SLP Short Term Goal 4 (Week 2): Pt complete functional math tasks with mod A. SLP Short Term Goal 5 (Week 2): Pt tolerate trials of Dys 3 consistency without signs of dysphagia to demonstrate readiness for diet upgrade.  Skilled Therapeutic Interventions: Skilled treatment session focused on dysphagia and cognition goals. SLP facilitated session by providing skilled observation of dysphagia 2 breakfast tray with dysphagia 3 items supplemented. Pt without s/s of aspiration and able to independently recall and implement compensatory swallow strategies. Pt's voice remained clear throughout. Pt with 2 coughs but not related to PO intake but able to clear well with Mod I. Pt completed functional math tasks independently in a moderately distracting environment. Pt able to demonstrate functional problem solving for moderately complex tasks (day planning task) with supervision cues. Pt self-corrected x 1. Pt returned to room, left with mother and all safety measures in place. All needs were within reach. Continue current plan of care.   Function:  Eating Eating   Modified Consistency Diet: Yes Eating Assist Level: More than reasonable amount of time;Set up assist for;Supervision or verbal cues   Eating Set Up Assist For: Opening containers Helper Scoops Food on Utensil:  Occasionally     Cognition Comprehension Comprehension assist level: Understands complex 90% of the time/cues 10% of the time  Expression   Expression assist level: Expresses complex 90% of the time/cues < 10% of the time  Social Interaction Social Interaction assist level: Interacts appropriately 90% of the time - Needs monitoring or encouragement for participation or interaction.  Problem Solving Problem solving assist level: Solves complex 90% of the time/cues < 10% of the time  Memory Memory assist level: Recognizes or recalls 90% of the time/requires cueing < 10% of the time    Pain    Therapy/Group: Individual Therapy  Parlee Amescua 09/13/2015, 12:13 PM

## 2015-09-13 NOTE — Progress Notes (Signed)
Physical Therapy Session Note  Patient Details  Name: Brad Mcgee MRN: [id number] Date of Birth: 1985/08/24  Today's Date: 09/13/2015 PT Individual Time: 1415-1500 PT Individual Time Calculation (min): 45 min   Short Term Goals: Week 2:  PT Short Term Goal 1 (Week 2): Pt will demonstrate bed mobility with mod assist using hospital bed functions PT Short Term Goal 2 (Week 2): Pt will transfer with mod assist and LRAD PT Short Term Goal 3 (Week 2): Pt will amb with mod assist and LRAD PT Short Term Goal 4 (Week 2): Pt will demonstrate static standing balance with mod assist PT Short Term Goal 5 (Week 2): Pt will tolerate sitting in regular w/c x2 hours  Skilled Therapeutic Interventions/Progress Updates:    Pt received resting in w/c and agreeable to therapy session.  Pt performed squat/pivot from tilt in space chair to regular w/c with mod assist and verbal cues for foot placement, forward weight shift, and head/hips relationship.  Pt propelled w/c to therapy gym with BLEs for LLR NMR focus on isolating movement.  Transfer training w/c<>therapy mat to R and L x4 times each side focus on forward weight shift, head hips relationship, and eccentric control.  Scooting along edge of therapy mat to R and L with mod assist to maintain squat position and verbal cues for head/hips relationship.  Pt returned to room at end of session and PT educated pt on use of standard height arm chair for forward weight shift for pressure relief.  Pt attempted lateral leans for pressure relief but reports no reduction in pressure.  Pt agreeable to stay in chair for at least 30 minutes.  NT notified to assist back to bed.  Call bell in reach and needs met.   Therapy Documentation Precautions:  Precautions Precautions: Fall Precaution Comments: watch BP, and O2 saturations on ATC Restrictions Weight Bearing Restrictions: No   See Function Navigator for Current Functional Status.   Therapy/Group: Individual  Therapy  [name] 09/13/2015, 3:29 PM

## 2015-09-13 NOTE — Patient Care Conference (Signed)
Inpatient RehabilitationTeam Conference and Plan of Care Update Date: 09/12/2015   Time: 11:20 AM    Patient Name: Brad Mcgee      Medical Record Number: [id number]  Date of Birth: 03/13/1986 Sex: Male         Room/Bed: 4W14C/4W14C-01 Payor Info: Payor: [employer] / Plan: [employer] OTHER / Product Type: *No Product type* /    Admitting Diagnosis: ICH  Admit Date/Time:  09/01/2015  6:05 PM Admission Comments: No comment available   Primary Diagnosis:  Basal ganglia hemorrhage (HCC) Principal Problem: Basal ganglia hemorrhage [facility](HCC)  Patient Active Problem List   Diagnosis Date Noted  . AKI (acute kidney injury) (HCC)   . Hyponatremia   . Dysphagia, post-stroke   . OSA (obstructive sleep apnea) suspected will need cpap when decannulated 09/05/2015  . Obesity hypoventilation syndrome (HCC)   . Tracheostomy care (HCC)   . Acute blood loss anemia   . Deep venous thrombosis (DVT) of left peroneal vein (HCC) 09/01/2015  . Dysphagia following nontraumatic intracerebral hemorrhage 09/01/2015  . Morbid obesity (HCC) 09/01/2015  . Nontraumatic acute hemorrhage of basal ganglia (HCC) 09/01/2015  . Tracheostomy in place [facility](HCC)   . Leukocytosis   . Fever, unspecified   . Right Basal ganglia hemorrhage (HCC)   . Acute respiratory failure (HCC)   . Acute respiratory failure with hypoxemia (HCC)   . Cytotoxic brain edema (HCC) 08/18/2015  . Brain herniation (HCC) 08/18/2015  . Accelerated hypertension 08/16/2015    Expected Discharge Date: Expected Discharge Date: 09/27/15  Team Members Present: Physician leading conference: Dr. Maryla MorrowAnkit Patel Social Worker Present: Amada JupiterLucy Cinthya Bors, LCSW PT Present: Edman CircleAudra Hall, PT OT Present: Callie FieldingKatie Pittman, OT SLP Present: Claudell KyleKara Turner, SLP PPS Coordinator present : Tora DuckMarie Noel, RN, CRRN     Current Status/Progress Goal Weekly Team Focus  Medical   Left hemiparesis, cognitive and functional deficitssecondary to right basal ganglia hemorrhage,  improving  Improve Mobility, transferes, dysphagia  see above   Bowel/Bladder   continent of Bowel/Bladder; LBM 6/26  Patient to remain continent of Bowel/Bladder with Min assist  Continue to monitor patient Bowel/Bladder needs and timed toiileting q2hrs   Swallow/Nutrition/ Hydration   Dys 2 with thin water only  Mod I with least restrictive diet  liberalize to all thins, strict adherence to aspiration precautions, additional trials of solid upgrades   ADL's   functtional transfers mod A to R, +2 assist for transfer to L, STEDY for shower transfers, Mod A UB and LB self care  min - mod A overall  functional transfers, self are retraining, LNMR, saferty,balance   Mobility   mod/max transfers and gait with hemiwalker, balance progressing well  min assist transfers, supervision w/c level, gait with therapy only  balance, postural control, transfers, gait, w/c mobility, NMR   Communication   mod I  Mod I  inadequate vocal fold closure currently   Safety/Cognition/ Behavioral Observations  min A  Supervision- mod I  reasoning and recall   Pain   Denied any pain or discomfort  <3   Assess and treat pain q shift and as needed   Skin   Trach site wound   Patient skin to remain free of new skin breakdown/Infection with Min assist  Asssess patient skin Q shift and as needed for any change in skin integrity    Rehab Goals Patient on target to meet rehab goals: Yes *See Care Plan and progress notes for long and short-term goals.  Barriers to Discharge: mobility,  transfers, hyponatremia, AKI    Possible Resolutions to Barriers:  Therapies, decanulated, follow labs, encourage fluids, liberalize diet as toelrated    Discharge Planning/Teaching Needs:  Home with family to provide 24/7 assistance.  Teaching is ongoing.   Team Discussion:  FEES completed.  Improved cognition overall.  OT showered yesterday with stedy.  Max - total assist ADLs and mod-max amb with hemi walker.  Pt very motivated  and working hard with therapies.  Revisions to Treatment Plan:  None   Continued Need for Acute Rehabilitation Level of Care: The patient requires daily medical management by a physician with specialized training in physical medicine and rehabilitation for the following conditions: Daily direction of a multidisciplinary physical rehabilitation program to ensure safe treatment while eliciting the highest outcome that is of practical value to the patient.: Yes Daily medical management of patient stability for increased activity during participation in an intensive rehabilitation regime.: Yes Daily analysis of laboratory values and/or radiology reports with any subsequent need for medication adjustment of medical intervention for : Neurological problems;Pulmonary problems  Raiana Pharris 09/13/2015, 1:03 PM

## 2015-09-14 ENCOUNTER — Inpatient Hospital Stay (HOSPITAL_COMMUNITY): Payer: 59 | Admitting: Speech Pathology

## 2015-09-14 ENCOUNTER — Inpatient Hospital Stay (HOSPITAL_COMMUNITY): Payer: 59 | Admitting: Occupational Therapy

## 2015-09-14 ENCOUNTER — Inpatient Hospital Stay (HOSPITAL_COMMUNITY): Payer: 59 | Admitting: Physical Therapy

## 2015-09-14 NOTE — Progress Notes (Signed)
Speech Language Pathology Daily Session Note  Patient Details  Name: Brad Mcgee MRN: [id number] Date of Birth: 01/10/1986  Today's Date: 09/14/2015 SLP Individual Time: 1100-1200 SLP Individual Time Calculation (min): 60 min  Short Term Goals: Week 2: SLP Short Term Goal 1 (Week 2): Pt will demonstrate functional problem solving for moderately-complex tasks with Min A verbal cues.  SLP Short Term Goal 2 (Week 2): Pt will recall novel information with mod A. SLP Short Term Goal 3 (Week 2): Pt demonstrate tolerance of current diet with adherence to compensatory strategies at mod I level as evidenced by no respiratory decline. SLP Short Term Goal 4 (Week 2): Pt complete functional math tasks with mod A. SLP Short Term Goal 5 (Week 2): Pt tolerate trials of Dys 3 consistency without signs of dysphagia to demonstrate readiness for diet upgrade.  Skilled Therapeutic Interventions: Skilled treatment session focused on dysphagia and cognition goals. SLP facilitated session by providing skilled observation of dysphagia 3 lunch tray. Pt able to independently recall compensatory swallow strategies and implement with Mod I. Pt with good oral clearing and able to independently use lingual sweep on left to facilitate bolus management. ST recommends diet upgrade to dysphagia 3. Pt required Mod I for functional problem solving of moderately-complex tasks in a moderately distracting environment. . Pt left upright in wheelchair in dayroom with father and cousin. Continue current plan of care.   Function:  Eating Eating   Modified Consistency Diet: Yes Eating Assist Level: Set up assist for;Supervision or verbal cues   Eating Set Up Assist For: Opening containers;Cutting food       Cognition Comprehension Comprehension assist level: Understands complex 90% of the time/cues 10% of the time  Expression   Expression assist level: Expresses basic 90% of the time/requires cueing < 10% of the time.  Social  Interaction Social Interaction assist level: Interacts appropriately 90% of the time - Needs monitoring or encouragement for participation or interaction.  Problem Solving Problem solving assist level: Solves basic 90% of the time/requires cueing < 10% of the time  Memory Memory assist level: More than reasonable amount of time    Pain    Therapy/Group: Individual Therapy  [name] Dreama SaaOverton 09/14/2015, 12:33 PM

## 2015-09-14 NOTE — Progress Notes (Signed)
Physical Therapy Session Note  Patient Details  Name: Brad Mcgee MRN: [id number] Date of Birth: [birth year]  Today's Date: 09/14/2015 PT Individual Time: 1430-1510 PT Individual Time Calculation (min): 40 min   Short Term Goals: Week 2:  PT Short Term Goal 1 (Week 2): Pt will demonstrate bed mobility with mod assist using hospital bed functions PT Short Term Goal 2 (Week 2): Pt will transfer with mod assist and LRAD PT Short Term Goal 3 (Week 2): Pt will amb with mod assist and LRAD PT Short Term Goal 4 (Week 2): Pt will demonstrate static standing balance with mod assist PT Short Term Goal 5 (Week 2): Pt will tolerate sitting in regular w/c x2 hours  Skilled Therapeutic Interventions/Progress Updates:    Pt received resting in w/c and agreeable to therapy session. PT provided extensive education to pt and his parents regarding progress, and expectations for d/c.  Pt's family provided home measurements and pt will be able to navigate majority of house from w/c level except one, maybe both, bathrooms.  Pt's mother implied that pt would be able to walk from room to room because it was a small house.  PT reiterated that it was not the distance that made ambulation without a therapist unsafe, it was because the pt continues to have decreased postural control and some increased tone in LLE adductors which make his gait pattern inconsistent and puts him at an increased risk of falling.  PT explained that it would be better to plan for w/c level at discharge and be ready, rather than expect pt to be ambulatory at d/c and not be prepared for a w/c.  Pt and his father readily verbalized understanding and agreement, mother simply stared at this therapist.    Pt taken to therapy gym total assist for time management.  Gait training x60' with hemiwalker and mod assist with min verbal cues for posture, step length, and L foot positioning for stance phase.  Nustep x10 minutes at level 2 with L hand splint and  LLE block for NMR and overall endurance.  Pt returned to room at end of session and performed squat/pivot to L with mod assist for control.  Pt positioned in tilt in space w/c for comfort, call bell in reach and needs met.   Therapy Documentation Precautions:  Precautions Precautions: Fall Precaution Comments: watch BP, and O2 saturations on ATC Restrictions Weight Bearing Restrictions: No   See Function Navigator for Current Functional Status.   Therapy/Group: Individual Therapy  [name] 09/14/2015, 4:45 PM

## 2015-09-14 NOTE — Progress Notes (Signed)
Found patient already on hospital CPAP machine with no assistance needed. 

## 2015-09-14 NOTE — Progress Notes (Cosign Needed)
Occupational Therapy Session Note  Patient Details  Name: [name] M Lafave MRN: [id number] Date of Birth: 12/28/1985  Today's Date: 09/14/2015 OT Individual Time:  -  0800-0900 60 minutes      Skilled Therapeutic Interventions/Progress Updates:    Pt today participated in tx focusing on increasing integration of L UE into basic self care task completion. Pt completed self feeding with supervision for setup with applied WB through L UE at EOB to improve trunk control and proprioceptive awareness through affected UE. Pt still requires manual cues and vcs to attend to L UE for safety during bed mobility and functional transfers. Pt completed UB ADLs w/c level. [facility] required [facility] assistance for using L UE as stabilizer and gross assist during oral care and grooming tasks with pt actively incorporating L UE as stabilizer approximately 50% of time. Pt exhibits increased weakness in proximal musculature of L UE compared to distal musculature and benefits from continued WB and functional activities with bilateral coordination demands. Pt reports L arm feeling like "dead weight." At end of session, pt left in w/c with all needs within reach and father present.    Therapy Documentation Precautions:  Precautions Precautions: Fall Precaution Comments: watch BP, and O2 saturations on ATC Restrictions Weight Bearing Restrictions: No General:   Vital Signs: Therapy Vitals Temp: 98 F (36.7 C) Temp Source: Oral Pulse Rate: 95 Resp: 18 BP: 100/62 mmHg Patient Position (if appropriate): Sitting Oxygen Therapy SpO2: 100 % O2 Device: Not Delivered:    See Function Navigator for Current Functional Status.   Therapy/Group: Individual Therapy  Leanna Hamid A Tresia Revolorio 09/14/2015, 4:29 PM

## 2015-09-14 NOTE — Progress Notes (Signed)
[place] PHYSICAL MEDICINE & REHABILITATION     PROGRESS NOTE  Subjective/Complaints:  Pt laying in bed this AM.  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  He slept well overnight.  He is happy about his progress.   ROS: Denies CP, SOB, N/V/D.  Objective: Vital Signs: Blood pressure 111/77, pulse 119, temperature 98.5 F (36.9 C), temperature source Oral, resp. rate 17, height 5\' 8"  (1.727 m), weight 111.3 kg (245 lb 6 oz), SpO2 96 %. No results found. No results for input(s): WBC, HGB, HCT, PLT in the last 72 hours.  Recent Labs  09/12/15 0629  NA 129*  K 4.7  CL 94*  GLUCOSE 103*  BUN 32*  CREATININE 1.19  CALCIUM 10.1   CBG (last 3)  No results for input(s): GLUCAP in the last 72 hours.  Wt Readings from Last 3 Encounters:  09/14/15 111.3 kg (245 lb 6 oz)  09/01/15 126.3 kg (278 lb 7.1 oz)  08/01/15 135.172 kg (298 lb)    Physical Exam:  BP 111/77 mmHg  Pulse 119  Temp(Src) 98.5 F (36.9 C) (Oral)  Resp 17  Ht 5\' 8"  (1.727 m)  Wt 111.3 kg (245 lb 6 oz)  BMI 37.32 kg/m2  SpO2 96% Constitutional: He appears well-developed and well-nourished. No distress.  Obese HENT: Normocephalic and atraumatic.  Eyes: Conjunctivae and EOM are normal.   Neck: Stoma healing.  Cardiovascular: Regular rate and rhythm.   Respiratory: Effort normal. No accessory muscle usage or stridor. No respiratory distress.   GI: Soft. Bowel sounds are normal. He exhibits no distension. There is no tenderness.  Musculoskeletal: He exhibits no edema or tenderness.  Neurological: He is alert and oriented.  Motor: RUE/RLE: 5/5 proximal to distal LUE: 2+/5 shoulder abduction, 2+/5 elbow flexion/extension, 3/5 finger grip LLE: 4-/5 proximal to distal No increased tone noted.  Speech improving Skin: Skin is warm and dry. He is not diaphoretic.  Psychiatric: His mood and behavior appear normal.    Assessment/Plan: 1. Functional deficits secondary to right basal ganglia hemorrhage which require  3+ hours per day of interdisciplinary therapy in a comprehensive inpatient rehab setting. Physiatrist is providing close team supervision and 24 hour management of active medical problems listed below. Physiatrist and rehab team continue to assess barriers to discharge/monitor patient progress toward functional and medical goals.  Function:  Bathing Bathing position   Position: Shower  Bathing parts Body parts bathed by patient: Left arm, Chest, Abdomen, Right upper leg, Left upper leg, Front perineal area, Buttocks Body parts bathed by helper: Right arm, Right lower leg, Left lower leg, Back, Buttocks  Bathing assist Assist Level:  (mod a)      Upper Body Dressing/Undressing Upper body dressing   What is the patient wearing?: Pull over shirt/dress     Pull over shirt/dress - Perfomed by patient: Thread/unthread right sleeve, Put head through opening, Pull shirt over trunk Pull over shirt/dress - Perfomed by helper: Thread/unthread left sleeve        Upper body assist Assist Level: Touching or steadying assistance(Pt > 75%)      Lower Body Dressing/Undressing Lower body dressing   What is the patient wearing?: Pants, Socks, Shoes     Pants- Performed by patient: Thread/unthread right pants leg Pants- Performed by helper: Thread/unthread left pants leg, Pull pants up/down   Non-skid slipper socks- Performed by helper: Don/doff left sock, Don/doff right sock   Socks - Performed by helper: Don/doff right sock, Don/doff left sock   Shoes - Performed  by helper: Don/doff right shoe, Don/doff left shoe, Fasten right, Fasten left          Lower body assist Assist for lower body dressing:  (total A)      Toileting Toileting     Toileting steps completed by helper: Adjust clothing prior to toileting, Performs perineal hygiene, Adjust clothing after toileting Toileting Assistive Devices: Grab bar or rail  Toileting assist Assist level: Set up/obtain supplies, Touching or  steadying assistance (Pt.75%)   Transfers Chair/bed transfer   Chair/bed transfer method: Squat pivot Chair/bed transfer assist level: Moderate assist (Pt 50 - 74%/lift or lower) Chair/bed transfer assistive device: Walker Mechanical lift: Landscape architecttedy   Locomotion Ambulation Ambulation activity did not occur: Safety/medical concerns   Max distance: 50 Assist level: Moderate assist (Pt 50 - 74%)   Wheelchair   Type: Manual Max wheelchair distance: 150 Assist Level: Supervision or verbal cues  Cognition Comprehension Comprehension assist level: Understands complex 90% of the time/cues 10% of the time  Expression Expression assist level: Expresses basic 75 - 89% of the time/requires cueing 10 - 24% of the time. Needs helper to occlude trach/needs to repeat words.  Social Interaction Social Interaction assist level: Interacts appropriately 75 - 89% of the time - Needs redirection for appropriate language or to initiate interaction.  Problem Solving Problem solving assist level: Solves basic 90% of the time/requires cueing < 10% of the time  Memory Memory assist level: Recognizes or recalls 90% of the time/requires cueing < 10% of the time     Medical Problem List and Plan: 1. Left hemiparesis, cognitive and functional deficitssecondary to right basal ganglia hemorrhage  Cont CIR, pt making good progress  Resting hand splint, wrist cock up splint, PRAFO  Fluoxetine started 6/19 2. Left peroneal DVT/Anticoagulation: IVC filter placed on 06/16, Lovenox 60mg  daily. 3. Pain Management: tylenol prn.  4. Mood: LCSW to follow for evaluation and support.  5. Neuropsych: This patient is not fully capable of making decisions on his own behalf. 6. Skin/Wound Care: Routine pressure relief measures.  7. Fluids/Electrolytes/Nutrition: Monitor intake on dysphagia 1, nectar liquids, advanced to dysphagia 3 thin liquids.   Advance diet as tolerated. 8. VDRF s/p trach: Decannulated on 6/21  CPAP  daily at bedtime   9. Acute on chronic CHF: Likely due to hypertonic saline. Check daily weights and montor I/O.  10. Hx of malignant HTN: Monitor BP.   Cont meds for now 11. Leukocytosis due to MSSA bronchitis v/s PNA and Enterobacter [id number]:Resolved    Cont to monitor temps and for other signs of infection. Completed rocephin 6/18. 12. ABLA: Resolved.   Hb 13.8 on 6/23 13. Enterobacter UTI: Completed rocephin 06/18.   Discontinued foley 14. Morbid obesity  Body mass index is 37.32 kg/(m^2).  Diet and exercise education  Cont to encourage weight loss to increase endurance and promote overall health 15. Hyponatremia  129 on 6/27  Thiazide d/ced   Stable/improving, will cont to monitor 16. AKI  Creatinine 1.19 on 6/24   Will encourage fluid intake  LOS (Days) 13 A FACE TO FACE EVALUATION WAS PERFORMED  Ankit Karis Jubanil Patel 09/14/2015 10:54 AM

## 2015-09-14 NOTE — Progress Notes (Signed)
Physical Therapy Session Note  Patient Details  Name: Brad Mcgee MRN: [id number] Date of Birth: 1985-12-20  Today's Date: 09/14/2015 PT Individual Time: 1000-1100 PT Individual Time Calculation (min): 60 min   Short Term Goals: Week 2:  PT Short Term Goal 1 (Week 2): Pt will demonstrate bed mobility with mod assist using hospital bed functions PT Short Term Goal 2 (Week 2): Pt will transfer with mod assist and LRAD PT Short Term Goal 3 (Week 2): Pt will amb with mod assist and LRAD PT Short Term Goal 4 (Week 2): Pt will demonstrate static standing balance with mod assist PT Short Term Goal 5 (Week 2): Pt will tolerate sitting in regular w/c x2 hours  Skilled Therapeutic Interventions/Progress Updates:    Pt received resting in tilt in space w/c with no c/o pain and agreeable to therapy session.  Pt reports that he tolerated trial sitting up in regular w/c with forward lean for pressure relief well yesterday.  Session focus on LLE NMR, standing balance, and pt education.    Pt performed squat/pivot from tilt in space to regular w/c with steady assist and propelled w/c to therapy gym with occasional use of RUE, but primarily BLEs for reciprocal stepping pattern and isolated LLE movement for NMR and endurance.  Sit<>stand from w/c in therapy gym x3 with steady assist using arm rests.  Dynamic standing balance, focus on increasing weight shift L and LLE weight bearing reaching for horseshoes to front and L of midline with RUE.  Pt required min assist overall for standing balance but mod/max verbal cues for maintaining L weight shift and L knee extension during activity.  NMR in tall kneeling focus on equal weight bearing through LEs during UE push ups from elevated surface for LUE weight bearing.  Pt positioned in side lying for rest break.  PROM stretching to BLEs for pain relief and for LLE tone management.  PT instructed pt in 10x sit<>squat focus on equal weight bearing through LEs, forward  weight shift, maintaining squat position x2-5 seconds, and controlled descent with PT providing manual facilitation for weight bearing through LUE on L knee.  Pt returned to w/c at end of session, squat/pivot to R with light steady assist.  Pt returned to room at end of session and positioned upright in w/c with call bell in reach and needs met.   Therapy Documentation Precautions:  Precautions Precautions: Fall Precaution Comments: watch BP, and O2 saturations on ATC Restrictions Weight Bearing Restrictions: No   See Function Navigator for Current Functional Status.   Therapy/Group: Individual Therapy  Chaden Doom E Penven-Crew 09/14/2015, 11:40 AM

## 2015-09-15 ENCOUNTER — Inpatient Hospital Stay (HOSPITAL_COMMUNITY): Payer: 59 | Admitting: Physical Therapy

## 2015-09-15 ENCOUNTER — Inpatient Hospital Stay (HOSPITAL_COMMUNITY): Payer: 59 | Admitting: Occupational Therapy

## 2015-09-15 ENCOUNTER — Inpatient Hospital Stay (HOSPITAL_COMMUNITY): Payer: 59 | Admitting: Speech Pathology

## 2015-09-15 DIAGNOSIS — G8194 Hemiplegia, unspecified affecting left nondominant side: Secondary | ICD-10-CM

## 2015-09-15 MED ORDER — FLUOXETINE HCL 10 MG PO CAPS
10.0000 mg | ORAL_CAPSULE | Freq: Every day | ORAL | Status: AC
Start: 2015-09-15 — End: 2015-09-16
  Filled 2015-09-15: qty 1

## 2015-09-15 MED ORDER — FLUOXETINE HCL 20 MG PO CAPS
20.0000 mg | ORAL_CAPSULE | Freq: Every day | ORAL | Status: DC
Start: 2015-09-16 — End: 2015-09-23
  Administered 2015-09-16 – 2015-09-23 (×8): 20 mg via ORAL
  Filled 2015-09-15 (×8): qty 1

## 2015-09-15 NOTE — Progress Notes (Signed)
Occupational Therapy Session Note  Patient Details  Name: Brad Mcgee MRN: [id number] Date of Birth: 06/30/1985  Today's Date: 09/15/2015 OT Individual Time: 1100-1200 OT Individual Time Calculation (min): 60 min    Skilled Therapeutic Interventions/Progress Updates:    Pt participated in tx focusing on improving standing balance and functional use of L UE during functional task completion. Pt completed oral care w/c level at sink with steadying assistance for L UE. Pt able to use L UE as stabilizer and initiates integration of L UE during oral care/grooming. Pt completed LB dressing standing with Min A to hike pants with use of hemiwalker. Pt can thread bilateral LEs through pants using right hand and required HOH to incorporate L UE into task. Afterwards, pt self propelled halfway to therapy kitchen. Pt completed cone stacking activity in standing with Min A for balance and AAROM of L UE to improve grasp and release as well as active shoulder flexion. Pt exhibited left shoulder hike and difficultly with finding optimal BOS. During session, pt benefited from visual biofeedback to increase awareness of posture, right sided leaning, and placement of LEs when ambulating in kitchen. Pt completed countertop washing activity incorporating side stepping and washing with L UE to facilitate active deltoid activity and finger extension. Pt continues to benefit from neuro re education of L UE as well as balance remediation.  Pt exhibited increased AROM of L UE shoulder flexion. Pt reported feeling dizzy post session and with a recovery period, pt reported no dizziness. Pt left in w/c with all needs within reach and educated on use of wash cloth during daily routine to work on L UE with verbalized understanding.   Therapy Documentation Precautions:  Precautions Precautions: Fall Precaution Comments: watch BP, and O2 saturations on ATC Restrictions Weight Bearing Restrictions: No    See Function Navigator  for Current Functional Status.   Therapy/Group: Individual Therapy  Chinenye Katzenberger A Charell Faulk 09/15/2015, 3:16 PM

## 2015-09-15 NOTE — Progress Notes (Signed)
Speech Language Pathology Weekly Progress and Session Note  Patient Details  Name: Brad Mcgee MRN: [id number] Date of Birth: May 22, 1985  Beginning of progress report period: September 09, 2015 End of progress report period: September 15, 2015  Today's Date: 09/15/2015 SLP Individual Time: 0800-0900 SLP Individual Time Calculation (min): 60 min  Short Term Goals: Week 2: SLP Short Term Goal 1 (Week 2): Pt will demonstrate functional problem solving for moderately-complex tasks with Min A verbal cues.  SLP Short Term Goal 1 - Progress (Week 2): Met SLP Short Term Goal 2 (Week 2): Pt will recall novel information with mod A. SLP Short Term Goal 2 - Progress (Week 2): Met SLP Short Term Goal 3 (Week 2): Pt demonstrate tolerance of current diet with adherence to compensatory strategies at mod I level as evidenced by no respiratory decline. SLP Short Term Goal 3 - Progress (Week 2): Met SLP Short Term Goal 4 (Week 2): Pt complete functional math tasks with mod A. SLP Short Term Goal 4 - Progress (Week 2): Met SLP Short Term Goal 5 (Week 2): Pt tolerate trials of Dys 3 consistency without signs of dysphagia to demonstrate readiness for diet upgrade. SLP Short Term Goal 5 - Progress (Week 2): Met    New Short Term Goals: Week 3: SLP Short Term Goal 1 (Week 3): Pt will demonstrate functional problem solving for moderately-complex tasks with supervision verbal cues.  SLP Short Term Goal 2 (Week 3): Pt will recall novel information with Min A verbal cues. SLP Short Term Goal 3 (Week 3): Pt complete functional math tasks with Mod I. SLP Short Term Goal 4 (Week 3): Pt tolerate trials of regular diet without signs of dysphagia to demonstrate readiness for diet upgrade.  Weekly Progress Updates: Pt has made great progress in skilled ST sessions. As a result he has met 5/5 of short term goals. Pt is currently tolerating dysphagia 3 diet with Mod I and performs functional to mildly complex problem solving  with increased attention and accuracy. Pt continues to require skilled ST in CIR setting to increase independence with advanced diet textures and higher/more complex cognitive functions.    Intensity: Minumum of 1-2 x/day, 30 to 90 minutes Frequency: 3 to 5 out of 7 days Duration/Length of Stay: 09/27/2015 Treatment/Interventions: Cognitive remediation/compensation;Dysphagia/aspiration precaution training;Patient/family education;Oral motor exercises;Speech/Language facilitation;Therapeutic Activities;Functional tasks;Cueing hierarchy;Therapeutic Exercise   Daily Session  Skilled Therapeutic Interventions: Skilled treatment session focused on dysphagia and cognition goals. SLP facilitated session by providing skilled observation of dysphagia 3 breakfast tray. Pt consumed without s/s of aspiration and demonstrated complete oral clearing of breakfast items. Pt given trials of regular snack and indepedent use of compensatory swallow strategies. Pt orally cleared trials well. Continue trials in future therapy sessions. SLP further facilitated session by providing Min A verbal cues for recall of novel information and for completion of functional math problems within a moderately distracting environment. Pt returned to room, left in wheelchair with all needs within reach. Continue current plan of care.      Function:   Eating Eating   Modified Consistency Diet: Yes Eating Assist Level: Supervision or verbal cues   Eating Set Up Assist For: Opening containers Helper Scoops Food on Utensil: Occasionally     Cognition Comprehension Comprehension assist level: Understands basic 90% of the time/cues < 10% of the time  Expression   Expression assist level: Expresses basic 90% of the time/requires cueing < 10% of the time.  Social Interaction Social Interaction assist level:  Interacts appropriately 90% of the time - Needs monitoring or encouragement for participation or interaction.  Problem Solving  Problem solving assist level: Solves basic 90% of the time/requires cueing < 10% of the time  Memory Memory assist level: Recognizes or recalls 75 - 89% of the time/requires cueing 10 - 24% of the time   General    Pain Pain Assessment Pain Assessment: No/denies pain Pain Score: 0-No pain  Therapy/Group: Individual Therapy  Brad Mcgee 09/15/2015, 12:40 PM

## 2015-09-15 NOTE — Progress Notes (Signed)
Patient states that his sister will help him with his CPAP when he is ready for sleep.  States that his sister is familiar with equipment and procedure.  Machine is already set-up with correct settings.  Will continue to monitor.

## 2015-09-15 NOTE — Progress Notes (Signed)
Physical Therapy Session Note  Patient Details  Name: Brad Mcgee MRN: [id number] Date of Birth: 02/08/1986  Today's Date: 09/15/2015 PT Individual Time: 1000-1100 PT Individual Time Calculation (min): 60 min   Short Term Goals: Week 2:  PT Short Term Goal 1 (Week 2): Pt will demonstrate bed mobility with mod assist using hospital bed functions PT Short Term Goal 2 (Week 2): Pt will transfer with mod assist and LRAD PT Short Term Goal 3 (Week 2): Pt will amb with mod assist and LRAD PT Short Term Goal 4 (Week 2): Pt will demonstrate static standing balance with mod assist PT Short Term Goal 5 (Week 2): Pt will tolerate sitting in regular w/c x2 hours  Skilled Therapeutic Interventions/Progress Updates:    Pt received resting in recliner with no c/o pain and agreeable to therapy.  Session focus on L NMR via forced use/weight bearing, weight shifts, postural control, dynamic balance, and stair negotiation.    Pt propelled w/c to therapy gym using BLEs for strengthening and LLE NMR for reciprocal stepping pattern and isolated movement.    Sit<>stand from w/c throughout session with steady assist using arm rests and verbal cues for foot placement.    NMR for increased LLE weight bearing standing in stagger stance (RLE forward) reaching for cards and matching to board with mod assist for balance without UE support and mod/max multimodal cues for posture, foot placement to maintain effective stagger stance, and L knee control.   NMR standing facing mat with BUE propped on mat with therapist providing tactile cues for L elbow extension and manual facilitation for weight bearing through L hand.  Progress to challenge balance, increase weight bearing through L hand and LE, and weight shifting L<>R reaching across midline to L for cups and stacking to R side.  Pt able to complete 2 trials of 12 cups each trial with seated rest break in between.  PT providing min assist for standing balance with pt's  LUE support.    PT instructed pt in stair negotiation x12 steps with mod assist overall, LLE ascending/RLE descending for strengthening and forced use.  Pt with increased postural sway ant-post direction with fatigue but improved with rehab tech standing behind patient (no physical support provided).    Pt returned to room in w/c in same manner as above and positioned upright in w/c with call bell in reach and needs met.   Therapy Documentation Precautions:  Precautions Precautions: Fall Precaution Comments: watch BP, and O2 saturations on ATC Restrictions Weight Bearing Restrictions: No   See Function Navigator for Current Functional Status.   Therapy/Group: Individual Therapy  Dallan Schonberg E Penven-Crew 09/15/2015, 10:55 AM

## 2015-09-15 NOTE — Progress Notes (Signed)
Physical Therapy Session Note  Patient Details  Name: Brad Mcgee MRN: [id number] Date of Birth: 03/12/1986  Today's Date: 09/15/2015 PT Individual Time: 1400-1430 PT Individual Time Calculation (min): 30 min   Short Term Goals: Week 2:  PT Short Term Goal 1 (Week 2): Pt will demonstrate bed mobility with mod assist using hospital bed functions PT Short Term Goal 2 (Week 2): Pt will transfer with mod assist and LRAD PT Short Term Goal 3 (Week 2): Pt will amb with mod assist and LRAD PT Short Term Goal 4 (Week 2): Pt will demonstrate static standing balance with mod assist PT Short Term Goal 5 (Week 2): Pt will tolerate sitting in regular w/c x2 hours  Skilled Therapeutic Interventions/Progress Updates:    Pt received resting in tilt in space w/c and agreeable to therapy session.  Pt's mother reports she assisted pt from regular w/c into tilt in space without assistance from staff.  PT reiterated reasoning behind calling for assistance, pt's mother stared at therapist.  PT initiated family training with pt's mother and father for squat/pivot transfers to L and R from w/c<>w/c with supervision and pt providing appropriate verbal cues.  Estimated assist from family mod assist.  PT provided verbal cues for body mechanics and set up of w/c.  PT provided further education that sign off on transfers were for bed<>w/c and w/c<>w/c only, and not for BSC.  Pt's mother states, "oh well I help him do that pretty much every day already."  PT again reiterated fall precautions and reason for safety plan.  Pt's mother continues to be unreceptive to education provided by this therapist, but pt and his father verbalized understanding and agreement.    Pt propelled w/c to therapy gym with BLEs for NMR and overall endurance.  Gait training [phone] with RW and 2 turns with overall mod assist.  PT providing min/mod verbal cues for foot placement and step-to pattern.  Pt requesting to go to dayroom at end of session and  self propelled w/c to table in day room.  Left with needs met and visitor present.    Therapy Documentation Precautions:  Precautions Precautions: Fall Precaution Comments: watch BP, and O2 saturations on ATC Restrictions Weight Bearing Restrictions: No   See Function Navigator for Current Functional Status.   Therapy/Group: Individual Therapy  [name] 09/15/2015, 2:48 PM

## 2015-09-15 NOTE — Progress Notes (Signed)
[facility]     PROGRESS NOTE  Subjective/Complaints:  Pt sleeping in bed this AM.  Mother at bedside.  Pt and mother note improvement in strength in LUE.    ROS: Denies CP, SOB, N/V/D.  Objective: Vital Signs: Blood pressure 125/70, pulse 88, temperature 97.7 F (36.5 C), temperature source Oral, resp. rate 18, height 5\' 8"  (1.727 m), weight 116.4 kg (256 lb 9.9 oz), SpO2 98 %. No results found. No results for input(s): WBC, HGB, HCT, PLT in the last 72 hours. No results for input(s): NA, K, CL, GLUCOSE, BUN, CREATININE, CALCIUM in the last 72 hours.  Invalid input(s): CO CBG (last 3)  No results for input(s): GLUCAP in the last 72 hours.  Wt Readings from Last 3 Encounters:  09/15/15 116.4 kg (256 lb 9.9 oz)  09/01/15 126.3 kg (278 lb 7.1 oz)  08/01/15 135.172 kg (298 lb)    Physical Exam:  BP 125/70 mmHg  Pulse 88  Temp(Src) 97.7 F (36.5 C) (Oral)  Resp 18  Ht 5\' 8"  (1.727 m)  Wt 116.4 kg (256 lb 9.9 oz)  BMI 39.03 kg/m2  SpO2 98% Constitutional: He appears well-developed and well-nourished. No distress.  Obese HENT: Normocephalic and atraumatic.  Eyes: Conjunctivae and EOM are normal.   Neck: Stoma healing.  Cardiovascular: Regular rate and rhythm.   Respiratory: Effort normal. No accessory muscle usage or stridor. No respiratory distress.   GI: Soft. Bowel sounds are normal. He exhibits no distension. There is no tenderness.  Musculoskeletal: He exhibits no edema or tenderness.  Neurological: He is alert and oriented.  Motor: RUE/RLE: 5/5 proximal to distal LUE: 3-/5 shoulder abduction, 3+/5 elbow flexion/extension, 3+/5 finger grip LLE: 4-/5 proximal to distal No increased tone noted.  Speech improving Skin: Skin is warm and dry. He is not diaphoretic.  Psychiatric: His mood and behavior appear normal.    Assessment/Plan: 1. Functional deficits secondary to right basal ganglia hemorrhage which require 3+ hours per  day of interdisciplinary therapy in a comprehensive inpatient rehab setting. Physiatrist is providing close team supervision and 24 hour management of active medical problems listed below. Physiatrist and rehab team continue to assess barriers to discharge/monitor patient progress toward functional and medical goals.  Function:  Bathing Bathing position   Position: Wheelchair/chair at sink  Bathing parts Body parts bathed by patient: Left arm, Chest, Abdomen Body parts bathed by helper: Back, Right arm  Bathing assist Assist Level: Touching or steadying assistance(Pt > 75%)      Upper Body Dressing/Undressing Upper body dressing   What is the patient wearing?: Pull over shirt/dress     Pull over shirt/dress - Perfomed by patient: Thread/unthread right sleeve, Put head through opening Pull over shirt/dress - Perfomed by helper: Pull shirt over trunk, Thread/unthread left sleeve        Upper body assist Assist Level: Touching or steadying assistance(Pt > 75%)      Lower Body Dressing/Undressing Lower body dressing   What is the patient wearing?: Pants, Socks, Shoes     Pants- Performed by patient: Thread/unthread right pants leg Pants- Performed by helper: Thread/unthread left pants leg, Pull pants up/down   Non-skid slipper socks- Performed by helper: Don/doff left sock, Don/doff right sock   Socks - Performed by helper: Don/doff right sock, Don/doff left sock   Shoes - Performed by helper: Don/doff right shoe, Don/doff left shoe, Fasten right, Fasten left          Lower body assist  Assist for lower body dressing:  (total A)      Toileting Toileting     Toileting steps completed by helper: Adjust clothing prior to toileting, Adjust clothing after toileting Toileting Assistive Devices:  (standing with hemiwalker to ues urinal)  Toileting assist Assist level: Touching or steadying assistance (Pt.75%)   Transfers Chair/bed transfer   Chair/bed transfer method:  Squat pivot Chair/bed transfer assist level: Moderate assist (Pt 50 - 74%/lift or lower) (mod assist to L and steady assist to R) Chair/bed transfer assistive device: Biochemist, clinicalWalker Mechanical lift: Magazine features editortedy   Locomotion Ambulation Ambulation activity did not occur: Safety/medical concerns   Max distance: 60 Assist level: Moderate assist (Pt 50 - 74%)   Wheelchair   Type: Manual Max wheelchair distance: 150 Assist Level: Supervision or verbal cues  Cognition Comprehension Comprehension assist level: Understands basic 90% of the time/cues < 10% of the time  Expression Expression assist level: Expresses basic 90% of the time/requires cueing < 10% of the time.  Social Interaction Social Interaction assist level: Interacts appropriately 90% of the time - Needs monitoring or encouragement for participation or interaction.  Problem Solving Problem solving assist level: Solves basic 90% of the time/requires cueing < 10% of the time  Memory Memory assist level: Recognizes or recalls 75 - 89% of the time/requires cueing 10 - 24% of the time     Medical Problem List and Plan: 1. Left hemiparesis, cognitive and functional deficitssecondary to right basal ganglia hemorrhage  Cont CIR, pt making good progress  Resting hand splint, wrist cock up splint, PRAFO  Fluoxetine started 6/19, increased to 20 on 6/30 2. Left peroneal DVT/Anticoagulation: IVC filter placed on 06/16, Lovenox 60mg  daily. 3. Pain Management: tylenol prn.  4. Mood: LCSW to follow for evaluation and support.  5. Neuropsych: This patient is not fully capable of making decisions on his own behalf. 6. Skin/Wound Care: Routine pressure relief measures.  7. Fluids/Electrolytes/Nutrition: Monitor intake on dysphagia 1, nectar liquids, advanced to dysphagia 3 thin liquids.   Advance diet as tolerated. 8. VDRF s/p trach: Decannulated on 6/21  CPAP daily at bedtime   9. Acute on chronic CHF: Likely due to hypertonic saline. Check  daily weights and montor I/O.  10. Hx of malignant HTN: Monitor BP.   Cont meds for now 11. Leukocytosis due to MSSA bronchitis v/s PNA and Enterobacter ZOX:[id number]:Resolved    Cont to monitor temps and for other signs of infection. Completed rocephin 6/18. 12. ABLA: Resolved.   Hb 13.8 on 6/23  Labs ordered for Monday 13. Enterobacter UTI: Completed rocephin 06/18.   Discontinued foley 14. Morbid obesity  Body mass index is 39.03 kg/(m^2).  Diet and exercise education  Cont to encourage weight loss to increase endurance and promote overall health 15. Hyponatremia  129 on 6/27  Thiazide d/ced   Stable/improving, will cont to monitor  Labs ordered for Monday 16. AKI  Creatinine 1.19 on 6/24   Will encourage fluid intake  Labs ordered for Monday  LOS (Days) 14 A FACE TO FACE EVALUATION WAS PERFORMED  Ankit Karis Jubanil Patel 09/15/2015 9:51 AM

## 2015-09-16 ENCOUNTER — Inpatient Hospital Stay (HOSPITAL_COMMUNITY): Payer: 59

## 2015-09-16 ENCOUNTER — Inpatient Hospital Stay (HOSPITAL_COMMUNITY): Payer: 59 | Admitting: Occupational Therapy

## 2015-09-16 ENCOUNTER — Inpatient Hospital Stay (HOSPITAL_COMMUNITY): Payer: 59 | Admitting: Speech Pathology

## 2015-09-16 DIAGNOSIS — R197 Diarrhea, unspecified: Secondary | ICD-10-CM

## 2015-09-16 MED ORDER — LOPERAMIDE HCL 2 MG PO CAPS: 2.0000 mg | ORAL_CAPSULE | ORAL | Status: DC | PRN

## 2015-09-16 MED ORDER — SACCHAROMYCES BOULARDII 250 MG PO CAPS
250.0000 mg | ORAL_CAPSULE | Freq: Two times a day (BID) | ORAL | Status: DC
Start: 2015-09-16 — End: 2015-09-23
  Administered 2015-09-16 – 2015-09-23 (×15): 250 mg via ORAL
  Filled 2015-09-16 (×15): qty 1

## 2015-09-16 NOTE — Progress Notes (Signed)
Patient places themselves on and off CPAP for nighttime. Will assist if needed.

## 2015-09-16 NOTE — Progress Notes (Signed)
Several loose stools since Thursday per family. Mother reports it started after diet was upgraded. Brad Mcgee, Brad Mcgee

## 2015-09-16 NOTE — Progress Notes (Signed)
Speech Language Pathology Daily Session Note  Patient Details  Name: Brad Mcgee MRN: [id number] Date of Birth: 02/27/1986  Today's Date: 09/16/2015 SLP Individual Time: 1000-1026 SLP Individual Time Calculation (min): 26 min  Short Term Goals: Week 3: SLP Short Term Goal 1 (Week 3): Pt will demonstrate functional problem solving for moderately-complex tasks with supervision verbal cues.  SLP Short Term Goal 2 (Week 3): Pt will recall novel information with Min A verbal cues. SLP Short Term Goal 3 (Week 3): Pt complete functional math tasks with Mod I. SLP Short Term Goal 4 (Week 3): Pt tolerate trials of regular diet without signs of dysphagia to demonstrate readiness for diet upgrade.  Skilled Therapeutic Interventions: Skilled treatment session focused on addressing cognition goals. SLP facilitated session by providing Min cues for initial mental flexibility faded to increased wait time to schedule 6 tasks around an 8 hour work.  Given that some of the variables were different from the patient's normal routine cues were initially needed.  Session ended with patient verbally predicting a new home routine for discharge with Supervision level question cues.  Continue with current plan of care.   Function:  Cognition Comprehension Comprehension assist level: Understands basic 90% of the time/cues < 10% of the time  Expression   Expression assist level: Expresses basic 90% of the time/requires cueing < 10% of the time.  Social Interaction Social Interaction assist level: Interacts appropriately 90% of the time - Needs monitoring or encouragement for participation or interaction.  Problem Solving Problem solving assist level: Solves basic 90% of the time/requires cueing < 10% of the time  Memory Memory assist level: Recognizes or recalls 75 - 89% of the time/requires cueing 10 - 24% of the time    Pain Pain Assessment Pain Assessment: No/denies pain  Therapy/Group: Individual  Therapy  Brad Mcgee, M.A., CCC-SLP [phone]  Karver Fadden 09/16/2015, 1:34 PM

## 2015-09-16 NOTE — Progress Notes (Signed)
[place] PHYSICAL MEDICINE & REHABILITATION     PROGRESS NOTE  Subjective/Complaints:  Multiple loose stools after diet upgraded. Pt states that food "runs through him".  He denies nausea, distention, abd pain, fever  ROS: Denies CP, SOB, pain, dysuria  Objective: Vital Signs: Blood pressure 114/74, pulse 101, temperature 98 F (36.7 C), temperature source Oral, resp. rate 18, height 5\' 8"  (1.727 m), weight 114.8 kg (253 lb 1.4 oz), SpO2 100 %. No results found. No results for input(s): WBC, HGB, HCT, PLT in the last 72 hours. No results for input(s): NA, K, CL, GLUCOSE, BUN, CREATININE, CALCIUM in the last 72 hours.  Invalid input(s): CO CBG (last 3)  No results for input(s): GLUCAP in the last 72 hours.  Wt Readings from Last 3 Encounters:  09/16/15 114.8 kg (253 lb 1.4 oz)  09/01/15 126.3 kg (278 lb 7.1 oz)  08/01/15 135.172 kg (298 lb)    Physical Exam:  BP 114/74 mmHg  Pulse 101  Temp(Src) 98 F (36.7 C) (Oral)  Resp 18  Ht 5\' 8"  (1.727 m)  Wt 114.8 kg (253 lb 1.4 oz)  BMI 38.49 kg/m2  SpO2 100% Constitutional: He appears well-developed and well-nourished. No distress.  Obese HENT: Normocephalic and atraumatic.  Eyes: Conjunctivae and EOM are normal.   Neck: Stoma healing.  Cardiovascular: Regular rate and rhythm.   Respiratory: Effort normal. No accessory muscle usage or stridor. No respiratory distress.   GI: Soft. Bowel sounds are normal. He exhibits no distension. There is no tenderness.  Musculoskeletal: He exhibits no edema or tenderness.  Neurological: He is alert and oriented.  Motor: RUE/RLE: 5/5 proximal to distal LUE: 3-/5 shoulder abduction, 3+/5 elbow flexion/extension, 3+/5 finger grip--inconsistent LLE: 4-/5 proximal to distal No increased tone noted.  Speech improving Skin: Skin is warm and dry. He is not diaphoretic.  Psychiatric: His mood and behavior appear normal.    Assessment/Plan: 1. Functional deficits secondary to right basal  ganglia hemorrhage which require 3+ hours per day of interdisciplinary therapy in a comprehensive inpatient rehab setting. Physiatrist is providing close team supervision and 24 hour management of active medical problems listed below. Physiatrist and rehab team continue to assess barriers to discharge/monitor patient progress toward functional and medical goals.  Function:  Bathing Bathing position   Position: Wheelchair/chair at sink  Bathing parts Body parts bathed by patient: Left arm, Chest, Abdomen Body parts bathed by helper: Back, Right arm  Bathing assist Assist Level: Touching or steadying assistance(Pt > 75%)      Upper Body Dressing/Undressing Upper body dressing   What is the patient wearing?: Pull over shirt/dress     Pull over shirt/dress - Perfomed by patient: Thread/unthread right sleeve, Put head through opening Pull over shirt/dress - Perfomed by helper: Pull shirt over trunk, Thread/unthread left sleeve        Upper body assist Assist Level: Touching or steadying assistance(Pt > 75%)      Lower Body Dressing/Undressing Lower body dressing   What is the patient wearing?: Underwear, Pants Underwear - Performed by patient: Thread/unthread right underwear leg, Pull underwear up/down   Pants- Performed by patient: Thread/unthread right pants leg, Thread/unthread left pants leg Pants- Performed by helper: Pull pants up/down   Non-skid slipper socks- Performed by helper: Don/doff left sock, Don/doff right sock   Socks - Performed by helper: Don/doff right sock, Don/doff left sock   Shoes - Performed by helper: Don/doff right shoe, Don/doff left shoe, Fasten right, Fasten left  Lower body assist Assist for lower body dressing: Touching or steadying assistance (Pt > 75%)      Toileting Toileting     Toileting steps completed by helper: Adjust clothing prior to toileting, Adjust clothing after toileting Toileting Assistive Devices:  (standing with  hemiwalker to ues urinal)  Toileting assist Assist level: Touching or steadying assistance (Pt.75%)   Transfers Chair/bed transfer   Chair/bed transfer method: Squat pivot Chair/bed transfer assist level: Touching or steadying assistance (Pt > 75%) Chair/bed transfer assistive device: Armrests Mechanical lift: Stedy   Locomotion Ambulation Ambulation activity did not occur: Safety/medical concerns   Max distance: 105 Assist level: Moderate assist (Pt 50 - 74%)   Wheelchair   Type: Manual Max wheelchair distance: 150 Assist Level: Supervision or verbal cues  Cognition Comprehension Comprehension assist level: Understands basic 90% of the time/cues < 10% of the time  Expression Expression assist level: Expresses basic 90% of the time/requires cueing < 10% of the time.  Social Interaction Social Interaction assist level: Interacts appropriately 90% of the time - Needs monitoring or encouragement for participation or interaction.  Problem Solving Problem solving assist level: Solves basic 90% of the time/requires cueing < 10% of the time  Memory Memory assist level: Recognizes or recalls 75 - 89% of the time/requires cueing 10 - 24% of the time     Medical Problem List and Plan: 1. Left hemiparesis, cognitive and functional deficitssecondary to right basal ganglia hemorrhage  Cont CIR, pt making good progress  Resting hand splint, wrist cock up splint, PRAFO   2. Left peroneal DVT/Anticoagulation: IVC filter placed on 06/16, Lovenox 60mg  daily. 3. Pain Management: tylenol prn.  4. Mood: LCSW to follow for evaluation and support  - Fluoxetine started 6/19, increased to 20 on 6/30 5. Neuropsych: This patient is not fully capable of making decisions on his own behalf. 6. Skin/Wound Care: Routine pressure relief measures.  7. Fluids/Electrolytes/Nutrition: Monitor intake on dysphagia 1, nectar liquids, advanced to dysphagia 3 thin liquids.     8. VDRF s/p trach: Decannulated  on 6/21  CPAP daily at bedtime   9. Acute on chronic CHF: Likely due to hypertonic saline. Check daily weights and montor I/O.  10. Hx of malignant HTN: Monitor BP.   Cont meds for now 11. Leukocytosis due to MSSA bronchitis v/s PNA and Enterobacter ZOX:WRUEAVWUTI:Resolved    Cont to monitor temps and for other signs of infection. Completed rocephin 6/18. 12. ABLA: Resolved.   Hb 13.8 on 6/23  Labs ordered for Monday 13. Enterobacter UTI: Completed rocephin 06/18.   Discontinued foley 14. Morbid obesity  Body mass index is 38.49 kg/(m^2).  Diet and exercise education  Cont to encourage weight loss to increase endurance and promote overall health 15. Hyponatremia  129 on 6/27  Thiazide d/ced   Stable/improving, will cont to monitor  Labs ordered for Monday 16. AKI  Creatinine 1.19 on 6/24   Will encourage fluid intake  Labs ordered for Monday 17. Diarrhea:  -?related to diet  -fluoxetine recently increased also  -he's on no scheduled softeners or laxatives  -add prn imodium and probiotic  - observe for now  LOS (Days) 15 A FACE TO FACE EVALUATION WAS PERFORMED  Everli Rother T 09/16/2015 8:16 AM

## 2015-09-16 NOTE — Progress Notes (Signed)
Occupational Therapy Session Note  Patient Details  Name: Brad Mcgee MRN: [id number] Date of Birth: 06/18/1985  Today's Date: 09/16/2015 OT Individual Time: 0700-0800 OT Individual Time Calculation (min): 60 min    Short Term Goals: Week 2:  OT Short Term Goal 1 (Week 2): Pt will complete toileting with the assist of 1 person in order to decrease level of assist with self care. OT Short Term Goal 2 (Week 2): Pt will perform bathing from TTB with min A for sitting balance. OT Short Term Goal 3 (Week 2): Pt will perform LB dressing with assist of 1 person in order to decrease level of assist with self care.  OT Short Term Goal 4 (Week 2): Pt will perform sit <>stand from wheelchair at sink during self care tasks with mod A in order to decrease assist with functional mobility.  Skilled Therapeutic Interventions/Progress Updates:    OT session focused on functional transfers, L NMR, and functional use of LUE during self-care tasks. Pt received supine in bed declining bathing and dressing this AM. Completed squat pivot transfer bed>w/c with min A. Pt completed grooming tasks at sink, initiating use of L hand as stabilizer and requiring min A to utilize functionally. Completed L NMR exercises focusing on shoulder stability, grasp release, and elbow extension during functional tasks. Pt completed modified wall push-ups from w/c 3 sets x8 reps, focusing on increasing proprioceptive input into LUE and strengthening. At end of session pt left in tilt in space w/c with all needs in reach.  Therapy Documentation Precautions:  Precautions Precautions: Fall Precaution Comments: watch BP, and O2 saturations on ATC Restrictions Weight Bearing Restrictions: No General:   Vital Signs:  Pain: Pain Assessment Pain Assessment: No/denies pain ADL:   Exercises:   Other Treatments:    See Function Navigator for Current Functional Status.   Therapy/Group: Individual Therapy  Daneil Danerkinson, Sherrie Marsan  N 09/16/2015, 11:18 AM

## 2015-09-17 ENCOUNTER — Inpatient Hospital Stay (HOSPITAL_COMMUNITY): Payer: 59 | Admitting: Physical Therapy

## 2015-09-17 NOTE — Progress Notes (Signed)
Patient's mother states that she will place it on when he is ready to go to sleep. RT will continue to monitor as needed.

## 2015-09-17 NOTE — Progress Notes (Signed)
[place] PHYSICAL MEDICINE & REHABILITATION     PROGRESS NOTE  Subjective/Complaints:   only one loose stool in am yesterday. Vigorously eating breakfast this AM when i arrived in room. States he's having some occasional pain on the dorsum of his left foot---comes and goes. Describes it as a mild throbbing.  ROS: Denies CP, SOB, pain, dysuria  Objective: Vital Signs: Blood pressure 125/66, pulse 91, temperature 97.8 F (36.6 C), temperature source Oral, resp. rate 16, height 5\' 8"  (1.727 m), weight 115.667 kg (255 lb), SpO2 100 %. No results found. No results for input(s): WBC, HGB, HCT, PLT in the last 72 hours. No results for input(s): NA, K, CL, GLUCOSE, BUN, CREATININE, CALCIUM in the last 72 hours.  Invalid input(s): CO CBG (last 3)  No results for input(s): GLUCAP in the last 72 hours.  Wt Readings from Last 3 Encounters:  09/17/15 115.667 kg (255 lb)  09/01/15 126.3 kg (278 lb 7.1 oz)  08/01/15 135.172 kg (298 lb)    Physical Exam:  BP 125/66 mmHg  Pulse 91  Temp(Src) 97.8 F (36.6 C) (Oral)  Resp 16  Ht 5\' 8"  (1.727 m)  Wt 115.667 kg (255 lb)  BMI 38.78 kg/m2  SpO2 100% Constitutional: He appears well-developed and well-nourished. No distress.  Obese HENT: Normocephalic and atraumatic.  Eyes: Conjunctivae and EOM are normal.   Neck: Stoma healing.  Cardiovascular: Regular rate and rhythm.   Respiratory: Effort normal. No accessory muscle usage or stridor. No respiratory distress.   GI: Soft. Bowel sounds are normal. He exhibits no distension. There is no tenderness.  Musculoskeletal: He exhibits no edema or tenderness. Left foot without any obvious changes.  Neurological: He is alert and oriented.  Motor: RUE/RLE: 5/5 proximal to distal LUE: 3-/5 shoulder abduction, 3+/5 elbow flexion/extension, 3+/5 finger grip--inconsistent LLE: 4-/5 proximal to distal No increased tone noted.  Speech improving Skin: Skin is warm and dry. He is not diaphoretic.   Psychiatric: His mood and behavior appear normal.    Assessment/Plan: 1. Functional deficits secondary to right basal ganglia hemorrhage which require 3+ hours per day of interdisciplinary therapy in a comprehensive inpatient rehab setting. Physiatrist is providing close team supervision and 24 hour management of active medical problems listed below. Physiatrist and rehab team continue to assess barriers to discharge/monitor patient progress toward functional and medical goals.  Function:  Bathing Bathing position   Position: Wheelchair/chair at sink  Bathing parts Body parts bathed by patient: Left arm, Chest, Abdomen Body parts bathed by helper: Back, Right arm  Bathing assist Assist Level: Touching or steadying assistance(Pt > 75%)      Upper Body Dressing/Undressing Upper body dressing   What is the patient wearing?: Pull over shirt/dress     Pull over shirt/dress - Perfomed by patient: Thread/unthread right sleeve, Put head through opening Pull over shirt/dress - Perfomed by helper: Pull shirt over trunk, Thread/unthread left sleeve        Upper body assist Assist Level: Touching or steadying assistance(Pt > 75%)      Lower Body Dressing/Undressing Lower body dressing   What is the patient wearing?: Underwear, Pants Underwear - Performed by patient: Thread/unthread right underwear leg, Pull underwear up/down   Pants- Performed by patient: Thread/unthread right pants leg, Thread/unthread left pants leg Pants- Performed by helper: Pull pants up/down   Non-skid slipper socks- Performed by helper: Don/doff left sock, Don/doff right sock   Socks - Performed by helper: Don/doff right sock, Don/doff left sock  Shoes - Performed by helper: Don/doff right shoe, Don/doff left shoe, Fasten right, Fasten left          Lower body assist Assist for lower body dressing: Touching or steadying assistance (Pt > 75%)      Toileting Toileting     Toileting steps completed by  helper: Adjust clothing prior to toileting, Performs perineal hygiene, Adjust clothing after toileting Toileting Assistive Devices: Grab bar or rail  Toileting assist Assist level: Touching or steadying assistance (Pt.75%)   Transfers Chair/bed transfer   Chair/bed transfer method: Squat pivot Chair/bed transfer assist level: Touching or steadying assistance (Pt > 75%) Chair/bed transfer assistive device: Armrests Mechanical lift: Stedy   Locomotion Ambulation Ambulation activity did not occur: Safety/medical concerns   Max distance: 105 Assist level: Moderate assist (Pt 50 - 74%)   Wheelchair   Type: Manual Max wheelchair distance: 150 Assist Level: Supervision or verbal cues  Cognition Comprehension Comprehension assist level: Understands complex 90% of the time/cues 10% of the time  Expression Expression assist level: Expresses complex 90% of the time/cues < 10% of the time  Social Interaction Social Interaction assist level: Interacts appropriately with others - No medications needed.  Problem Solving Problem solving assist level: Solves complex 90% of the time/cues < 10% of the time  Memory Memory assist level: Complete Independence: No helper     Medical Problem List and Plan: 1. Left hemiparesis, cognitive and functional deficitssecondary to right basal ganglia hemorrhage  Cont CIR, pt making continued functional gains  Resting hand splint, wrist cock up splint, PRAFO   2. Left peroneal DVT/Anticoagulation: IVC filter placed on 06/16, Lovenox 60mg  daily. 3. Pain Management: tylenol prn.   -left foot pain--transient, no swelling or discoloration. Unclear source---observer for now 4. Mood: LCSW to follow for evaluation and support  - Fluoxetine started 6/19, increased to 20 on 6/30 5. Neuropsych: This patient is not fully capable of making decisions on his own behalf. 6. Skin/Wound Care: Routine pressure relief measures.  7. Fluids/Electrolytes/Nutrition: Monitor  intake on dysphagia 1, nectar liquids, advanced to dysphagia 3 thin liquids.     8. VDRF s/p trach: Decannulated on 6/21  CPAP daily at bedtime   9. Acute on chronic CHF: Likely due to hypertonic saline. Check daily weights and montor I/O.  10. Hx of malignant HTN: Monitor BP.   Cont meds for now 11. Leukocytosis due to MSSA bronchitis v/s PNA and Enterobacter ZOX:WRUEAVWUTI:Resolved    Cont to monitor temps and for other signs of infection. Completed rocephin 6/18. 12. ABLA: Resolved.   Hb 13.8 on 6/23  Labs ordered for Monday 13. Enterobacter UTI: Completed rocephin 06/18.   Discontinued foley 14. Morbid obesity  Body mass index is 38.78 kg/(m^2).  Diet and exercise education  Cont to encourage weight loss to increase endurance and promote overall health 15. Hyponatremia  129 on 6/27  Thiazide d/ced   Stable/improving, will cont to monitor  Labs ordered for Monday 16. AKI  Creatinine 1.19 on 6/24   Will encourage fluid intake  Labs ordered for Monday 17. Diarrhea:  -?related to diet  -fluoxetine recently increased also  -he's on no scheduled softeners or laxatives  -add prn imodium and probiotic  - observe for now  LOS (Days) 16 A FACE TO FACE EVALUATION WAS PERFORMED  Treasure Ochs T 09/17/2015 9:37 AM

## 2015-09-17 NOTE — Progress Notes (Signed)
Physical Therapy Weekly Progress Note  Patient Details  Name: Brad Mcgee MRN: [id number] Date of Birth: 1985-12-25  Beginning of progress report period: September 08, 2015 End of progress report period: September 17, 2015  Today's Date: 09/17/2015 PT Individual Time: 0904-1004 PT Individual Time Calculation (min): 60 min   Patient has met 5 of 5 short term goals.  Pt continues to progress well with therapy and has advanced to consistently using hemiwalker for gait with decreasing levels of assist/cuing required.  Pt and family education is ongoing regarding adherence to safety plan while in rehab and goals at d/c.  Pt continues to be highly motivated.   Patient continues to demonstrate the following deficits: unbalance muscle activation, L hemiparesis, decreased dynamic balance and delayed righting reactions, and therefore will continue to benefit from skilled PT intervention to enhance overall performance with activity tolerance, balance, postural control, functional use of  left upper extremity and left lower extremity and coordination.  Patient progressing toward long term goals..  Plan of care revisions: all goals updated to supervision/min assist  PT Short Term Goals Week 3:  PT Short Term Goal 1 (Week 3): Pt will demonstrate community level w/c mobility with supervision PT Short Term Goal 2 (Week 3): Pt will ambulate in community setting with mod assist and LRAD PT Short Term Goal 3 (Week 3): Pt will demonstrate dynamic standing balance with min assist PT Short Term Goal 4 (Week 3): Pt will self direct transfers with family with 50% cues from therapist.   Skilled Therapeutic Interventions/Progress Updates:    Pt received resting in w/c and agreeable to therapy session, no c/o pain.  Session focus on L NMR UE/LE, strengthening, balance, and gait.    Pt self propelled w/c to therapy gym with BLEs for NMR and overall endurance.  PT instructed pt in 10 minutes on UEB with ace wrap around L hand to  assist with maintaining grip.  Pt performed 5 minutes forwards and 5 minutes backwards for forced use and overall endurance.  PT instructed pt in 11 reps of w/c pushups focus on increased weight shift/load through LUE.  NMR for LUE reaching for stacked cups and moving 1 cup at a time from L to R.  Pt required more than a reasonable amount of time to move 3 cups.    Gait training x35'+120' with hemiwalker and overall min assist with increased need for verbal cues for step length and walker positioning as pt fatigued.  PT discussed with pt and family goals for locomotion at d/c.  Continue to recommend family only transfer bed<>w/c at this time and use w/c at d/c.  Pt continues to progress, but remains inconsistent with need for cuing and assist level with ambulation and is unsafe to do so with family at this time.    Pt positioned in tilt in space w/c at end of session with call bell in reach and needs met.   Therapy Documentation Precautions:  Precautions Precautions: Fall Precaution Comments: watch BP, and O2 saturations on ATC Restrictions Weight Bearing Restrictions: No   See Function Navigator for Current Functional Status.  Therapy/Group: Individual Therapy  Jamyrah Saur E Penven-Crew 09/17/2015, 10:08 AM

## 2015-09-17 NOTE — Progress Notes (Signed)
No loose stools. BP 99/60, paged Dr. Riley KillSwartz, orders to hold scheduled lisinopril at HS. Complained of left foot with "throbbing" pain, PRN tylenol given and warm compress applied. No further complaint.Alfredo MartinezMurray, Saniah Schroeter A

## 2015-09-17 NOTE — Plan of Care (Signed)
Problem: RH Bed Mobility Goal: LTG Patient will perform bed mobility with assist (PT) LTG: Patient will perform bed mobility with assistance, with/without cues (PT).  Upgraded due to pt progress.   Problem: RH Bed to Chair Transfers Goal: LTG Patient will perform bed/chair transfers w/assist (PT) LTG: Patient will perform bed/chair transfers with assistance, with/without cues (PT).  Upgraded due to pt progress.  Problem: RH Furniture Transfers Goal: LTG Patient will perform furniture transfers w/assist (OT/PT LTG: Patient will perform furniture transfers with assistance (OT/PT).  Upgraded due to pt progress.   Problem: RH Floor Transfers Goal: LTG Patient will perform floor transfers w/assist (PT) LTG: Patient will perform floor transfers with assistance (PT).  Outcome: Not Applicable Date Met:  03/27/01 D/C. Goal not appropriate at this time.   Problem: RH Ambulation Goal: LTG Patient will ambulate in controlled environment (PT) LTG: Patient will ambulate in a controlled environment, # of feet with assistance (PT).  Upgraded due to pt progress  Problem: RH Ambulation Goal: LTG Patient will ambulate in home environment (PT) LTG: Patient will ambulate in home environment, # of feet with assistance (PT).  Upgraded due to pt progress.

## 2015-09-18 ENCOUNTER — Inpatient Hospital Stay (HOSPITAL_COMMUNITY): Payer: 59 | Admitting: Speech Pathology

## 2015-09-18 ENCOUNTER — Inpatient Hospital Stay (HOSPITAL_COMMUNITY): Payer: 59 | Admitting: Occupational Therapy

## 2015-09-18 ENCOUNTER — Inpatient Hospital Stay (HOSPITAL_COMMUNITY): Payer: 59 | Admitting: Physical Therapy

## 2015-09-18 DIAGNOSIS — D696 Thrombocytopenia, unspecified: Secondary | ICD-10-CM | POA: Insufficient documentation

## 2015-09-18 LAB — BASIC METABOLIC PANEL
Anion gap: 8 (ref 5–15)
BUN: 15 mg/dL (ref 6–20)
CO2: 23 mmol/L (ref 22–32)
Calcium: 9.6 mg/dL (ref 8.9–10.3)
Chloride: 101 mmol/L (ref 101–111)
Creatinine, Ser: 1.01 mg/dL (ref 0.61–1.24)
GFR calc Af Amer: >60 mL/min (ref >60)
GFR calc non Af Amer: >60 mL/min (ref >60)
Glucose, Bld: 130 mg/dL — ABNORMAL HIGH (ref 65–99)
Potassium: 4 mmol/L (ref 3.5–5.1)
Sodium: 132 mmol/L — ABNORMAL LOW (ref 135–145)

## 2015-09-18 LAB — CBC WITH DIFFERENTIAL/PLATELET
Basophils Absolute: 0 10*3/uL (ref 0.0–0.1)
Basophils Relative: 1 %
Eosinophils Absolute: 0.2 10*3/uL (ref 0.0–0.7)
Eosinophils Relative: 3 %
HCT: 38.5 % — ABNORMAL LOW (ref 39.0–52.0)
Hemoglobin: 13 g/dL (ref 13.0–17.0)
Lymphocytes Relative: 37 %
Lymphs Abs: 1.9 10*3/uL (ref 0.7–4.0)
MCH: 32.2 pg (ref 26.0–34.0)
MCHC: 33.8 g/dL (ref 30.0–36.0)
MCV: 95.3 fL (ref 78.0–100.0)
Monocytes Absolute: 0.4 10*3/uL (ref 0.1–1.0)
Monocytes Relative: 7 %
Neutro Abs: 2.8 10*3/uL (ref 1.7–7.7)
Neutrophils Relative %: 52 %
Platelets: 134 10*3/uL — ABNORMAL LOW (ref 150–400)
RBC: 4.04 MIL/uL — ABNORMAL LOW (ref 4.22–5.81)
RDW: 12.5 % (ref 11.5–15.5)
WBC: 5.3 10*3/uL (ref 4.0–10.5)

## 2015-09-18 NOTE — Progress Notes (Signed)
Recreational Therapy Session Note  Patient Details  Name: Brad Mcgee MRN: [id number] Date of Birth: 25-Feb-1986 Today's Date: 09/18/2015  Pain: no c/o Skilled Therapeutic Interventions/Progress Updates: Met with pt & girlfriend briefly today to discuss community reintegration/outing Wednesday.  Purpose of the outing and potential goals discussed.  Pt agreeable and excited to participate.   [place] 09/18/2015, 2:40 PM

## 2015-09-18 NOTE — Progress Notes (Signed)
Speech Language Pathology Daily Session Note  Patient Details  Name: Brad Mcgee MRN: [id number] Date of Birth: 04/08/1985  Today's Date: 09/18/2015 SLP Individual Time: 0800-0900 SLP Individual Time Calculation (min): 60 min  Short Term Goals: Week 3: SLP Short Term Goal 1 (Week 3): Pt will demonstrate functional problem solving for moderately-complex tasks with supervision verbal cues.  SLP Short Term Goal 2 (Week 3): Pt will recall novel information with Min A verbal cues. SLP Short Term Goal 3 (Week 3): Pt complete functional math tasks with Mod I. SLP Short Term Goal 4 (Week 3): Pt tolerate trials of regular diet without signs of dysphagia to demonstrate readiness for diet upgrade.  Skilled Therapeutic Interventions: Skilled treatment session focused on dysphagia and cognition goals. SLP facilitated session by providing skilled observation of consumption of dysphagia 3 breakfast tray. Pt consumed without s/s of overt aspiration. Pt able to independently recall and implement compensatory swallow strategies. Pt cleared left buccal residue well. Pt demonstrated accurate functional problem solving for complex tasks with extra time and he recalled novel information with question cues. Pt completed functional math tasks with extra time. Progress made. Would benefit from trials of regular textures. Pt left in dayroom, upright in wheelchair with his father. Continue current plan of care.   Function:  Eating Eating   Modified Consistency Diet: Yes Eating Assist Level: Set up assist for;Supervision or verbal cues   Eating Set Up Assist For: Opening containers;Cutting food Helper Scoops Food on Utensil: Occasionally     Cognition Comprehension Comprehension assist level: Follows complex conversation/direction with extra time/assistive device  Expression   Expression assist level: Expresses complex ideas: With extra time/assistive device  Social Interaction Social Interaction assist level:  Interacts appropriately with others - No medications needed.  Problem Solving Problem solving assist level: Solves complex problems: With extra time  Memory Memory assist level: Complete Independence: No helper    Pain    Therapy/Group: Individual Therapy  [name] 09/18/2015, 12:06 PM

## 2015-09-18 NOTE — Progress Notes (Signed)
Physical Therapy Session Note  Patient Details  Name: Brad Mcgee MRN: [id number] Date of Birth: 05/16/1985  Today's Date: 09/18/2015 PT Individual Time: 1335-1406 PT Individual Time Calculation (min): 31 min   Short Term Goals: Week 3:  PT Short Term Goal 1 (Week 3): Pt will demonstrate community level w/c mobility with supervision PT Short Term Goal 2 (Week 3): Pt will ambulate in community setting with mod assist and LRAD PT Short Term Goal 3 (Week 3): Pt will demonstrate dynamic standing balance with min assist PT Short Term Goal 4 (Week 3): Pt will self direct transfers with family with 50% cues from therapist.   Skilled Therapeutic Interventions/Progress Updates:    Pt received resting in w/c with no c/o pain and agreeable to therapy session.  Session focus on transfers from w/c to/from therapy mat with supervision for stand pivot, dynamic standing balance and standing tolerance while playing Wii [employer], and gait training [phone] with hemiwalker and close supervision.  Pt continues to progress well with therapy each session and would continue to benefit from skilled intervention to maximize independence once d/c'd home with family.  Pt positioned in w/c at end of session with call bell in reach and needs met.   Therapy Documentation Precautions:  Precautions Precautions: Fall Precaution Comments: watch BP, and O2 saturations on ATC Restrictions Weight Bearing Restrictions: No   See Function Navigator for Current Functional Status.   Therapy/Group: Individual Therapy  [name] 09/18/2015, 4:53 PM

## 2015-09-18 NOTE — Progress Notes (Signed)
Pt's mother places pt on'off cpap.  Rt will monitor. 

## 2015-09-18 NOTE — Progress Notes (Signed)
[facility] [place] PHYSICAL MEDICINE & REHABILITATION     PROGRESS NOTE  Subjective/Complaints:  Pt sitting up in his recliner this AM.  His stomach is "queezy" but feels a little better after BM this AM.  He continues to make progress.   ROS: Denies CP, SOB, pain, dysuria  Objective: Vital Signs: Blood pressure 120/77, pulse 98, temperature 97.7 F (36.5 C), temperature source Axillary, resp. rate 18, height 5\' 8"  (1.727 m), weight 115.1 kg (253 lb 12 oz), SpO2 100 %. No results found.  Recent Labs  09/18/15 0933  WBC 5.3  HGB 13.0  HCT 38.5*  PLT 134*   No results for input(s): NA, K, CL, GLUCOSE, BUN, CREATININE, CALCIUM in the last 72 hours.  Invalid input(s): CO CBG (last 3)  No results for input(s): GLUCAP in the last 72 hours.  Wt Readings from Last 3 Encounters:  09/18/15 115.1 kg (253 lb 12 oz)  09/01/15 126.3 kg (278 lb 7.1 oz)  08/01/15 135.172 kg (298 lb)    Physical Exam:  BP 120/77 mmHg  Pulse 98  Temp(Src) 97.7 F (36.5 C) (Axillary)  Resp 18  Ht 5\' 8"  (1.727 m)  Wt 115.1 kg (253 lb 12 oz)  BMI 38.59 kg/m2  SpO2 100% Constitutional: He appears well-developed and well-nourished. No distress.  Obese HENT: Normocephalic and atraumatic.  Eyes: Conjunctivae and EOM are normal.   Neck: Stoma healing.  Cardiovascular: Regular rate and rhythm.   Respiratory: Effort normal. No accessory muscle usage or stridor. No respiratory distress.   GI: Soft. Bowel sounds are normal. He exhibits no distension. There is no tenderness.  Musculoskeletal: He exhibits no edema or tenderness. Left foot without any obvious changes.  Neurological: He is alert and oriented.  Motor: RUE/RLE: 5/5 proximal to distal LUE: 3/5 shoulder abduction, 3+/5 elbow flexion/extension, 4/5 finger grip LLE: 4+/5 proximal to distal, apraxia. No increased tone noted.  Speech improving Skin: Skin is warm and dry. He is not diaphoretic.  Psychiatric: His mood and behavior appear normal.     Assessment/Plan: 1. Functional deficits secondary to right basal ganglia hemorrhage which require 3+ hours per day of interdisciplinary therapy in a comprehensive inpatient rehab setting. Physiatrist is providing close team supervision and 24 hour management of active medical problems listed below. Physiatrist and rehab team continue to assess barriers to discharge/monitor patient progress toward functional and medical goals.  Function:  Bathing Bathing position   Position: Wheelchair/chair at sink  Bathing parts Body parts bathed by patient: Left arm, Chest, Abdomen Body parts bathed by helper: Back, Right arm  Bathing assist Assist Level: Touching or steadying assistance(Pt > 75%)      Upper Body Dressing/Undressing Upper body dressing   What is the patient wearing?: Pull over shirt/dress     Pull over shirt/dress - Perfomed by patient: Thread/unthread right sleeve, Put head through opening Pull over shirt/dress - Perfomed by helper: Pull shirt over trunk, Thread/unthread left sleeve        Upper body assist Assist Level: Touching or steadying assistance(Pt > 75%)      Lower Body Dressing/Undressing Lower body dressing   What is the patient wearing?: Underwear, Pants Underwear - Performed by patient: Thread/unthread right underwear leg, Pull underwear up/down   Pants- Performed by patient: Thread/unthread right pants leg, Thread/unthread left pants leg Pants- Performed by helper: Pull pants up/down   Non-skid slipper socks- Performed by helper: Don/doff left sock, Don/doff right sock   Socks - Performed by helper: Don/doff right sock, Don/doff  left sock   Shoes - Performed by helper: Don/doff right shoe, Don/doff left shoe, Fasten right, Fasten left          Lower body assist Assist for lower body dressing: Touching or steadying assistance (Pt > 75%)      Toileting Toileting     Toileting steps completed by helper: Adjust clothing prior to toileting,  Performs perineal hygiene, Adjust clothing after toileting Toileting Assistive Devices: Grab bar or rail  Toileting assist Assist level: Touching or steadying assistance (Pt.75%)   Transfers Chair/bed transfer   Chair/bed transfer method: Ambulatory Chair/bed transfer assist level: Touching or steadying assistance (Pt > 75%) Chair/bed transfer assistive device: Walker, Armrests Mechanical lift: Landscape architecttedy   Locomotion Ambulation Ambulation activity did not occur: Safety/medical concerns   Max distance: 120 Assist level: Touching or steadying assistance (Pt > 75%)   Wheelchair   Type: Manual Max wheelchair distance: 150 Assist Level: No help, No cues, assistive device, takes more than reasonable amount of time  Cognition Comprehension Comprehension assist level: Understands complex 90% of the time/cues 10% of the time  Expression Expression assist level: Expresses complex 90% of the time/cues < 10% of the time  Social Interaction Social Interaction assist level: Interacts appropriately with others - No medications needed.  Problem Solving Problem solving assist level: Solves complex 90% of the time/cues < 10% of the time  Memory Memory assist level: Complete Independence: No helper     Medical Problem List and Plan: 1. Left hemiparesis, cognitive and functional deficitssecondary to right basal ganglia hemorrhage  Cont CIR, pt making continued functional gains  Resting hand splint, wrist cock up splint, PRAFO Fluoxetine started 6/19, increased to 20 on 6/30  2. Left peroneal DVT/Anticoagulation: IVC filter placed on 06/16, Lovenox 60mg  daily. 3. Pain Management: tylenol prn.   -left foot pain--transient, no swelling or discoloration. Unclear source 4. Mood: LCSW to follow for evaluation and support 5. Neuropsych: This patient is not fully capable of making decisions on his own behalf. 6. Skin/Wound Care: Routine pressure relief measures.  7.  Fluids/Electrolytes/Nutrition: Monitor intake on dysphagia 1, nectar liquids, advanced to dysphagia 3 thin liquids.  8. VDRF s/p trach: Decannulated on 6/21  CPAP daily at bedtime   9. Acute on chronic CHF: Likely due to hypertonic saline. Check daily weights and montor I/O.  10. Hx of malignant HTN: Monitor BP.   Cont meds for now, stable 11. Leukocytosis due to MSSA bronchitis v/s PNA and Enterobacter [id number]:Resolved    Cont to monitor temps and for other signs of infection. Completed rocephin 6/18. 12. ABLA: Resolved.   Hb 13.0 on 7/3 13. Enterobacter UTI: Completed rocephin 06/18.   Discontinued foley 14. Morbid obesity  Body mass index is 38.59 kg/(m^2).  Diet and exercise education  Cont to encourage weight loss to increase endurance and promote overall health 15. Hyponatremia  132 on 7/3  Thiazide d/ced   Stable/improving, will cont to monitor 16. AKI  Creatinine 1.01 on 7/3  (improving)  Will encourage fluid intake 17. Thrombocytopenia  Plts 134 on 7/3  Will cont to monitor  LOS (Days) 17 A FACE TO FACE EVALUATION WAS PERFORMED  Brad Mcgee 09/18/2015 9:52 AM

## 2015-09-18 NOTE — Progress Notes (Signed)
Physical Therapy Session Note  Patient Details  Name: Brad Mcgee MRN: [id number] Date of Birth: 02-08-1986  Today's Date: 09/18/2015 PT Individual Time: 1003-1102 PT Individual Time Calculation (min): 59 min   Short Term Goals: Week 3:  PT Short Term Goal 1 (Week 3): Pt will demonstrate community level w/c mobility with supervision PT Short Term Goal 2 (Week 3): Pt will ambulate in community setting with mod assist and LRAD PT Short Term Goal 3 (Week 3): Pt will demonstrate dynamic standing balance with min assist PT Short Term Goal 4 (Week 3): Pt will self direct transfers with family with 50% cues from therapist.   Skilled Therapeutic Interventions/Progress Updates:    Pt received resting in w/c, no c/o pain, and agreeable to therapy session.  Session focus on simulated household ambulation, car transfers, NMR, and overall endurance.    Pt self propelled w/c to therapy apartment with BLEs for reciprocal stepping pattern, forced LLE use, isolated muscle activation in LLE, and overall endurance.  PT instructed pt in transfer w/c<>bed with squat pivot (supervision, verbal cues to lock w/c), and w/c<>bed with ambulatory approach using hemiwalker with close supervision and verbal cues for sequencing.  PT instructed pt in car transfer, simulated SUV height, with hemiwalker and ambulatory approach, with close supervision and verbal cues for sequencing.  Gait training on plush carpet to simulate home environment x15' with steady assist using hemiwalker.  Pt requires increased time to negotiate turns on carpet compared to firm surfaces.  Pt amb from CVA office to Nustep with hemiwalker and close supervision for safety.  Nustep x10 minutes at level 4 with LUE splint for reciprocal stepping pattern, forced use of LUE/LLE, and overall endurance.    Gait training back to pt's room x140' with hemiwalker and close supervision>steady assist.  Pt positioned upright in w/c at end of session with call bell in  reach and needs met.   Therapy Documentation Precautions:  Precautions Precautions: Fall Precaution Comments: watch BP, and O2 saturations on ATC Restrictions Weight Bearing Restrictions: No   See Function Navigator for Current Functional Status.   Therapy/Group: Individual Therapy  [place] Scheiderer E Penven-Crew 09/18/2015, 10:47 AM

## 2015-09-18 NOTE — Progress Notes (Signed)
Occupational Therapy Session Note  Patient Details  Name: [name] M Steenson MRN: [id number] Date of Birth: 08/17/1985  Today's Date: 09/18/2015 OT Individual Time:0702  - 0800   58 minutes of skilled OT intervention   Short Term Goals: Week 2:  OT Short Term Goal 1 (Week 2): Pt will complete toileting with the assist of 1 person in order to decrease level of assist with self care. OT Short Term Goal 2 (Week 2): Pt will perform bathing from TTB with min A for sitting balance. OT Short Term Goal 3 (Week 2): Pt will perform LB dressing with assist of 1 person in order to decrease level of assist with self care.  OT Short Term Goal 4 (Week 2): Pt will perform sit <>stand from wheelchair at sink during self care tasks with mod A in order to decrease assist with functional mobility.  Skilled Therapeutic Interventions/Progress Updates:    Upon entering the room, pt supine in bed sleeping but agreeable to OT intervention. Pt performed supine >sit with supervision and use of bed rail. Pt declined shower and dressing this session. Pt stating, "My mother did it last night." OT providing educated to pt regarding importance of him performing task himself and expectation for him to perform task at next session in order to assess goals at this time. Pt verbalized understanding. Pt engaged in PNF movement patterns for D1 and D2 while supine in bed with R UE. Pt performed sit >stand from edge of bed with min A. Pt ambulating 10' to sit in wheelchair at sink with min A. Pt performed grooming tasks at sink without needing cues to incorporate L UE into functional tasks. Pt able to turn water on and off with L UE this session. Pt verbalized need for toileting. Pt ambulated with min A and hemi walker into bathroom where pt was able to push clothing down with min A for balance, he needed therapist to hold urinal while he voided. Pt able to pull R side of pants over hip but needing assistance with L. Pt returning to wheelchair  and safety plan updated to reflect current progress. Call bell and all needed items within reach upon exiting the room.   Therapy Documentation Precautions:  Precautions Precautions: Fall Precaution Comments: watch BP, and O2 saturations on ATC Restrictions Weight Bearing Restrictions: No General:   Vital Signs: Therapy Vitals Temp: 97.8 F (36.6 C) Temp Source: Oral Pulse Rate: (!) 115 Resp: 18 BP: 120/62 mmHg Patient Position (if appropriate): Sitting Oxygen Therapy SpO2: 100 % O2 Device: Not Delivered  See Function Navigator for Current Functional Status.   Therapy/Group: Individual Therapy  Lowella Gripittman, [place] Floren L 09/18/2015, 4:24 PM

## 2015-09-19 ENCOUNTER — Inpatient Hospital Stay (HOSPITAL_COMMUNITY): Payer: 59 | Admitting: Occupational Therapy

## 2015-09-19 ENCOUNTER — Inpatient Hospital Stay (HOSPITAL_COMMUNITY): Payer: 59 | Admitting: Physical Therapy

## 2015-09-19 ENCOUNTER — Inpatient Hospital Stay (HOSPITAL_COMMUNITY): Payer: 59 | Admitting: Speech Pathology

## 2015-09-19 LAB — CBC WITH DIFFERENTIAL/PLATELET
Basophils Absolute: 0 10*3/uL (ref 0.0–0.1)
Basophils Relative: 0 %
Eosinophils Absolute: 0.2 10*3/uL (ref 0.0–0.7)
Eosinophils Relative: 3 %
HCT: 32.2 % — ABNORMAL LOW (ref 39.0–52.0)
Hemoglobin: 10.9 g/dL — ABNORMAL LOW (ref 13.0–17.0)
Lymphocytes Relative: 47 %
Lymphs Abs: 2.4 10*3/uL (ref 0.7–4.0)
MCH: 32.2 pg (ref 26.0–34.0)
MCHC: 33.9 g/dL (ref 30.0–36.0)
MCV: 95 fL (ref 78.0–100.0)
Monocytes Absolute: 0.5 10*3/uL (ref 0.1–1.0)
Monocytes Relative: 9 %
Neutro Abs: 2.1 10*3/uL (ref 1.7–7.7)
Neutrophils Relative %: 41 %
Platelets: 110 10*3/uL — ABNORMAL LOW (ref 150–400)
RBC: 3.39 MIL/uL — ABNORMAL LOW (ref 4.22–5.81)
RDW: 12.5 % (ref 11.5–15.5)
WBC: 5.1 10*3/uL (ref 4.0–10.5)

## 2015-09-19 LAB — BASIC METABOLIC PANEL
Anion gap: 8 (ref 5–15)
BUN: 14 mg/dL (ref 6–20)
CO2: 23 mmol/L (ref 22–32)
Calcium: 9.2 mg/dL (ref 8.9–10.3)
Chloride: 102 mmol/L (ref 101–111)
Creatinine, Ser: 1.01 mg/dL (ref 0.61–1.24)
GFR calc Af Amer: >60 mL/min (ref >60)
GFR calc non Af Amer: >60 mL/min (ref >60)
Glucose, Bld: 99 mg/dL (ref 65–99)
Potassium: 4.2 mmol/L (ref 3.5–5.1)
Sodium: 133 mmol/L — ABNORMAL LOW (ref 135–145)

## 2015-09-19 NOTE — Progress Notes (Signed)
Speech Language Pathology Daily Session Note  Patient Details  Name: Brad Mcgee M Hurwitz MRN: [id number] Date of Birth: 01/08/1986  Today's Date: 09/19/2015 SLP Individual Time: 0800-0900 SLP Individual Time Calculation (min): 60 min  Short Term Goals: Week 3: SLP Short Term Goal 1 (Week 3): Pt will demonstrate functional problem solving for moderately-complex tasks with supervision verbal cues.  SLP Short Term Goal 2 (Week 3): Pt will recall novel information with Min A verbal cues. SLP Short Term Goal 3 (Week 3): Pt complete functional math tasks with Mod I. SLP Short Term Goal 4 (Week 3): Pt tolerate trials of regular diet without signs of dysphagia to demonstrate readiness for diet upgrade.  Skilled Therapeutic Interventions: Skilled treatment session focused on addressing dysphagia and cognition goals. SLP facilitated session by providing extra time and assist to open one container with set up of breakfast tray of regular textures and thin liquids.  Patient was Mod I throughout meal for cutting food and utilize safe swallow strategies.  Patient demonstrated efficient mastication and oral clearance of advanced texture Independently.  Recommend diet upgrade with intermittent supervision.  SLP also facilitated session with a novel complex reasoning task that patient completed with 1 verbal cue.  Recommend a medical management task with use of pill box at next visit.  Continue with current plan of care.   Function:  Eating Eating   Modified Consistency Diet: No Eating Assist Level: Set up assist for;Supervision or verbal cues   Eating Set Up Assist For: Opening containers       Cognition Comprehension Comprehension assist level: Follows complex conversation/direction with extra time/assistive device  Expression   Expression assist level: Expresses complex ideas: With extra time/assistive device  Social Interaction Social Interaction assist level: Interacts appropriately with others with  medication or extra time (anti-anxiety, antidepressant).  Problem Solving Problem solving assist level: Solves complex problems: With extra time  Memory Memory assist level: Complete Independence: No helper    Pain Pain Assessment Pain Assessment: No/denies pain  Therapy/Group: Individual Therapy  Charlane FerrettiMelissa Davian Hanshaw, M.A., CCC-SLP [phone]  Kynleigh Artz 09/19/2015, 10:23 AM

## 2015-09-19 NOTE — Progress Notes (Signed)
[place] PHYSICAL MEDICINE & REHABILITATION     PROGRESS NOTE  Subjective/Complaints:  Pt laying in bed this AM.  He has questions about home use of CPAP.    ROS: Denies CP, SOB, pain, dysuria  Objective: Vital Signs: Blood pressure 119/63, pulse 67, temperature 97.9 F (36.6 C), temperature source Axillary, resp. rate 18, height 5\' 8"  (1.727 m), weight 115.259 kg (254 lb 1.6 oz), SpO2 100 %. No results found.  Recent Labs  09/18/15 0933 09/19/15 0536  WBC 5.3 5.1  HGB 13.0 10.9*  HCT 38.5* 32.2*  PLT 134* 110*    Recent Labs  09/18/15 0933 09/19/15 0536  NA 132* 133*  K 4.0 4.2  CL 101 102  GLUCOSE 130* 99  BUN 15 14  CREATININE 1.01 1.01  CALCIUM 9.6 9.2   CBG (last 3)  No results for input(s): GLUCAP in the last 72 hours.  Wt Readings from Last 3 Encounters:  09/19/15 115.259 kg (254 lb 1.6 oz)  09/01/15 126.3 kg (278 lb 7.1 oz)  08/01/15 135.172 kg (298 lb)    Physical Exam:  BP 119/63 mmHg  Pulse 67  Temp(Src) 97.9 F (36.6 C) (Axillary)  Resp 18  Ht 5\' 8"  (1.727 m)  Wt 115.259 kg (254 lb 1.6 oz)  BMI 38.64 kg/m2  SpO2 100% Constitutional: He appears well-developed and well-nourished. No distress.  Obese HENT: Normocephalic and atraumatic.  Eyes: Conjunctivae and EOM are normal.   Neck: Stoma healing.  Cardiovascular: Regular rate and rhythm.   Respiratory: Effort normal. No accessory muscle usage or stridor. No respiratory distress.   GI: Soft. Bowel sounds are normal. He exhibits no distension. There is no tenderness.  Musculoskeletal: He exhibits no edema or tenderness. Left foot without any obvious changes.  Neurological: He is alert and oriented.  Motor: RUE/RLE: 5/5 proximal to distal LUE: 3/5 shoulder abduction, 3+/5 elbow flexion/extension, 4/5 finger grip (improving) LLE: 4+/5 proximal to distal, apraxia. No increased tone noted.  Speech improving Skin: Skin is warm and dry. He is not diaphoretic.  Psychiatric: His mood and  behavior appear normal.    Assessment/Plan: 1. Functional deficits secondary to right basal ganglia hemorrhage which require 3+ hours per day of interdisciplinary therapy in a comprehensive inpatient rehab setting. Physiatrist is providing close team supervision and 24 hour management of active medical problems listed below. Physiatrist and rehab team continue to assess barriers to discharge/monitor patient progress toward functional and medical goals.  Function:  Bathing Bathing position Bathing activity did not occur: Refused Position: Wheelchair/chair at sink  Bathing parts Body parts bathed by patient: Left arm, Chest, Abdomen Body parts bathed by helper: Back, Right arm  Bathing assist Assist Level: Touching or steadying assistance(Pt > 75%)      Upper Body Dressing/Undressing Upper body dressing   What is the patient wearing?: Pull over shirt/dress     Pull over shirt/dress - Perfomed by patient: Thread/unthread right sleeve, Put head through opening Pull over shirt/dress - Perfomed by helper: Pull shirt over trunk, Thread/unthread left sleeve        Upper body assist Assist Level: Touching or steadying assistance(Pt > 75%)      Lower Body Dressing/Undressing Lower body dressing   What is the patient wearing?: Underwear, Pants Underwear - Performed by patient: Thread/unthread right underwear leg, Pull underwear up/down   Pants- Performed by patient: Thread/unthread right pants leg, Thread/unthread left pants leg Pants- Performed by helper: Pull pants up/down   Non-skid slipper socks- Performed by helper:  Don/doff left sock, Don/doff right sock   Socks - Performed by helper: Don/doff right sock, Don/doff left sock   Shoes - Performed by helper: Don/doff right shoe, Don/doff left shoe, Fasten right, Fasten left          Lower body assist Assist for lower body dressing: Touching or steadying assistance (Pt > 75%)      Toileting Toileting   Toileting steps  completed by patient: Adjust clothing prior to toileting, Performs perineal hygiene Toileting steps completed by helper: Adjust clothing after toileting Toileting Assistive Devices: Grab bar or rail  Toileting assist Assist level:  (mod A)   Transfers Chair/bed transfer   Chair/bed transfer method: Squat pivot, Stand pivot Chair/bed transfer assist level: Supervision or verbal cues Chair/bed transfer assistive device: Armrests Mechanical lift: Stedy   Locomotion Ambulation Ambulation activity did not occur: Safety/medical concerns   Max distance: 115 Assist level: Supervision or verbal cues   Wheelchair   Type: Manual Max wheelchair distance: 150 Assist Level: No help, No cues, assistive device, takes more than reasonable amount of time  Cognition Comprehension Comprehension assist level: Follows complex conversation/direction with extra time/assistive device  Expression Expression assist level: Expresses complex ideas: With extra time/assistive device  Social Interaction Social Interaction assist level: Interacts appropriately with others with medication or extra time (anti-anxiety, antidepressant).  Problem Solving Problem solving assist level: Solves complex problems: With extra time  Memory Memory assist level: Complete Independence: No helper     Medical Problem List and Plan: 1. Left hemiparesis, cognitive and functional deficitssecondary to right basal ganglia hemorrhage  Cont CIR, pt making continued functional gains  Resting hand splint, wrist cock up splint, PRAFO Fluoxetine started 6/19, increased to 20 on 6/30  2. Left peroneal DVT/Anticoagulation: IVC filter placed on 06/16, Lovenox 60mg  daily. 3. Pain Management: tylenol prn.   -left foot pain--transient, no swelling or discoloration. Unclear source 4. Mood: LCSW to follow for evaluation and support 5. Neuropsych: This patient is not fully capable of making decisions on his own behalf. 6.  Skin/Wound Care: Routine pressure relief measures.  7. Fluids/Electrolytes/Nutrition: Monitor intake on dysphagia 1, nectar liquids, advanced to regular diet.  8. VDRF s/p trach: Decannulated on 6/21  CPAP daily at bedtime   9. Acute on chronic CHF: Likely due to hypertonic saline. Check daily weights and montor I/O.  10. Hx of malignant HTN: Monitor BP.   Cont meds for now, stable 11. Leukocytosis due to MSSA bronchitis v/s PNA and Enterobacter [id number]:Resolved    Cont to monitor temps and for other signs of infection. Completed rocephin 6/18. 12. ABLA:   Hb 10.9 on 7/4 13. Enterobacter UTI: Completed rocephin 06/18.   Discontinued foley 14. Morbid obesity  Body mass index is 38.64 kg/(m^2).  Diet and exercise education  Cont to encourage weight loss to increase endurance and promote overall health 15. Hyponatremia  133 on 7/4  Thiazide d/ced   Stable/improving, will cont to monitor 16. AKI  Creatinine 1.01 on 7/4  (stable)  Will encourage fluid intake 17. Thrombocytopenia, possibly related to Lovenox, will consider medication change  Plts 110 on 7/4  Will cont to monitor  LOS (Days) 18 A FACE TO FACE EVALUATION WAS PERFORMED  Brad Mcgee 09/19/2015 9:32 AM

## 2015-09-19 NOTE — Progress Notes (Signed)
Occupational Therapy Session Note  Patient Details  Name: Brad Mcgee MRN: [id number] Date of Birth: 03/31/1985  Today's Date: 09/19/2015 OT Individual Time: 1430-1500 OT Individual Time Calculation (min): 30 min    Short Term Goals: Week 3:  OT Short Term Goal 1 (Week 3): Pt will complete UB dressing with Mod I  OT Short Term Goal 2 (Week 3): Pt will complete LB dressing with Min A OT Short Term Goal 3 (Week 3): Pt will complete bathing in shower with supervision  OT Short Term Goal 4 (Week 3): Pt will complete toileting with Mod I   Skilled Therapeutic Interventions/Progress Updates:    Upon entering the room, pt sleeping in bed but agreeable to OT intervention. His mother present in room who had various questions related to discharge. OT answering questions until family with no further questions at this time. Pt performed sit >stand from bed with quad cane and supervision. Pt ambulating from room to day room with close supervision and use of quad cane 80'. Pt seated in recliner chair and demonstrated the ability to don and doff B socks and shoes from this seated surface. Pt able to cross opposite leg over in order to increase I wit functional task. Pt standing from recliner chair and opening latches of windows with L UE in order to look outside. Pt requiring increased time to complete task but he was able to open and close window. Pt returning to room at end of session and seated on EOB with family present in room. Call bell and all needed items within reach upon exiting the room.   Therapy Documentation Precautions:  Precautions Precautions: Fall Precaution Comments: watch BP, and O2 saturations on ATC Restrictions Weight Bearing Restrictions: No Vital Signs: Therapy Vitals Temp: 98.1 F (36.7 C) Temp Source: Oral Pulse Rate: (!) 117 Resp: 18 BP: 109/75 mmHg Patient Position (if appropriate): Sitting Oxygen Therapy SpO2: 100 % O2 Device: Not Delivered  See Function  Navigator for Current Functional Status.   Therapy/Group: Individual Therapy  Lowella Gripittman, Rindy Kollman L 09/19/2015, 5:03 PM

## 2015-09-19 NOTE — Progress Notes (Signed)
Physical Therapy Session Note  Patient Details  Name: Brad Mcgee MRN: [id number] Date of Birth: March 22, 1985  Today's Date: 09/19/2015 PT Individual Time: 1010-1103 PT Individual Time Calculation (min): 53 min   Short Term Goals: Week 3:  PT Short Term Goal 1 (Week 3): Pt will demonstrate community level w/c mobility with supervision PT Short Term Goal 2 (Week 3): Pt will ambulate in community setting with mod assist and LRAD PT Short Term Goal 3 (Week 3): Pt will demonstrate dynamic standing balance with min assist PT Short Term Goal 4 (Week 3): Pt will self direct transfers with family with 50% cues from therapist.   Skilled Therapeutic Interventions/Progress Updates:    Pt received resting in w/c with no c/o pain and agreeable to therapy session.  Session focus on high level gait training with hemiwalker and WBQC.    Pt amb to therapy gym with hemiwalker and supervision.  PT instructed pt in high level gait obstacle course focus on amb on compliant surfaces, around and over obstacles with light steady assist fade to supervision over 2 trials.  PT administered TUG with avg across 3 trials 46.68 seconds, and gait speed .29 seconds with hemiwalker and supervision.  PT instructed pt in gait with wide based quad cane x165' with initial steady assist, fade to supervision.  Pt amb back to room at end of session and positioned in w/c with call bell in reach and needs met.   Therapy Documentation Precautions:  Precautions Precautions: Fall Precaution Comments: watch BP, and O2 saturations on ATC Restrictions Weight Bearing Restrictions: No   See Function Navigator for Current Functional Status.   Therapy/Group: Individual Therapy  [name] E Penven-Crew 09/19/2015, 11:59 AM

## 2015-09-19 NOTE — Plan of Care (Signed)
Problem: RH Balance Goal: LTG: Patient will maintain dynamic sitting balance (OT) LTG: Patient will maintain dynamic sitting balance with assistance during activities of daily living (OT)  Due to progress Goal: LTG Patient will maintain dynamic standing with ADLs (OT) LTG: Patient will maintain dynamic standing balance with assist during activities of daily living (OT)  Due to progress  Problem: RH Bathing Goal: LTG Patient will bathe with assist, cues/equipment (OT) LTG: Patient will bathe specified number of body parts with assist with/without cues using equipment (position) (OT)  Due to progress  Problem: RH Toilet Transfers Goal: LTG Patient will perform toilet transfers w/assist (OT) LTG: Patient will perform toilet transfers with assist, with/without cues using equipment (OT)  Due to progress

## 2015-09-19 NOTE — Progress Notes (Signed)
Pt's mother places pt on'off cpap.  Rt will monitor.

## 2015-09-19 NOTE — Progress Notes (Signed)
Occupational Therapy Weekly Progress Note  Patient Details  Name: Brad Mcgee MRN: [id number] Date of Birth: September 27, 1985  Beginning of progress report period: 09/02/15 End of progress report period: 09/19/15  Today's Date: 09/19/2015 OT Individual Time: 4193-7902 OT Individual Time Calculation (min): 51 min    Patient has met 4 of 4 short term goals.    Patient continues to demonstrate the following deficits: left UE AROM/coordination and balance, and therefore will continue to benefit from skilled OT intervention to enhance overall performance with BADLs. During this report period, pt has improved standing balance, left sided awareness, and integration of L UE into functional tasks with min cuing. Pt has improved toilet transfer abilities, going from requiring Max Mcgee during week 1 to now requiring supervision; pt now requires supervision when completing UB dressing carrying over hemi dressing techniques, where pt required Mod Mcgee to complete during week 1 of CIR. Pt is actively incorporating L UE into basic self care tasks as stabilizer and gross assist and demonstrating good carryover of hemi dressing techniques and safety when completing functional transfers and self care tasks. Due to pts progress, LTGs have been upgraded to ADL completion at [place] I level.     Patient progressing toward long term goals..  Plan of care revisions completed.    OT Short Term Goals Week 3:  OT Short Term Goal 1 (Week 3): Pt will complete UB dressing with Mod I  OT Short Term Goal 2 (Week 3): Pt will complete LB dressing with Min Mcgee OT Short Term Goal 3 (Week 3): Pt will complete bathing in shower with supervision  OT Short Term Goal 4 (Week 3): Pt will complete toileting with Mod I   Skilled Therapeutic Interventions/Progress Updates:    Pt participated in self care mgt retraining in shower with shower bench. Pt completed functional TTB transfer with quad cane and supervision for vcs and demonstration on  technique. Pt completed UB/LB bathing with Min Mcgee for back. "I use Mcgee sponge at home." Pt provided LH sponge to reach back to further decrease LOA during next ADL session. Pt completed UB/LB dressing sitting on raised toilet with bars removed to simulate toilet at home that is elevated but does not have grab bar access. Pt completed transfer with supervision and quad cane with education on keeping room toilet modified to prepare pt for toileting at time of discharge with verbalized agreement. Pt completed UB dressing with supervision for orientation and LB dressing with Mod Mcgee for socks and shoes due to tightness of socks. Pt was provided education on purchasing looser socks for easier completion of LB dressing with verbalized understanding. During bathing, pts ADL items were placed on left sided to promote incorporation of L UE. Pt today was able to use L UE as Mcgee gross assist during bathing and dressing with min cuing. When items fell in shower pt was provided questioning cues to problem solve with pt requiring instruction on adaptive technique with good carryover. Pt exhibited improved static and dynamic standing balance compared to standing balance last week. Pt continues to benefit from skilled OT for remediation of functional use of L UE, balance, safety awareness, and family education for discharge. At end of session pt left in w/c with family member present and all needs within reach.   Therapy Documentation Precautions:  Precautions Precautions: Fall Precaution Comments: watch BP, and O2 saturations on ATC Restrictions Weight Bearing Restrictions: No General:   Vital Signs: Therapy Vitals Temp: 98.1 F (36.7 C)  Temp Source: Oral Pulse Rate: (!) 117 Resp: 18 BP: 109/75 mmHg Patient Position (if appropriate): Sitting Oxygen Therapy SpO2: 100 % O2 Device: Not Delivered :    See Function Navigator for Current Functional Status.   Therapy/Group: Individual Therapy  Brad Mcgee  Brad Mcgee 09/19/2015, 3:54 PM

## 2015-09-20 ENCOUNTER — Inpatient Hospital Stay (HOSPITAL_COMMUNITY): Payer: 59 | Admitting: Occupational Therapy

## 2015-09-20 ENCOUNTER — Inpatient Hospital Stay (HOSPITAL_COMMUNITY): Payer: 59 | Admitting: Physical Therapy

## 2015-09-20 ENCOUNTER — Inpatient Hospital Stay (HOSPITAL_COMMUNITY): Payer: 59 | Admitting: *Deleted

## 2015-09-20 ENCOUNTER — Inpatient Hospital Stay (HOSPITAL_COMMUNITY): Payer: 59 | Admitting: Speech Pathology

## 2015-09-20 ENCOUNTER — Encounter (HOSPITAL_COMMUNITY): Payer: 59 | Admitting: Physical Therapy

## 2015-09-20 ENCOUNTER — Inpatient Hospital Stay (HOSPITAL_COMMUNITY): Payer: 59

## 2015-09-20 DIAGNOSIS — Z299 Encounter for prophylactic measures, unspecified: Secondary | ICD-10-CM | POA: Insufficient documentation

## 2015-09-20 DIAGNOSIS — Z7901 Long term (current) use of anticoagulants: Secondary | ICD-10-CM

## 2015-09-20 DIAGNOSIS — I82409 Acute embolism and thrombosis of unspecified deep veins of unspecified lower extremity: Secondary | ICD-10-CM

## 2015-09-20 LAB — CBC WITH DIFFERENTIAL/PLATELET
Basophils Absolute: 0 10*3/uL (ref 0.0–0.1)
Basophils Relative: 0 %
Eosinophils Absolute: 0.1 10*3/uL (ref 0.0–0.7)
Eosinophils Relative: 3 %
HCT: 31.8 % — ABNORMAL LOW (ref 39.0–52.0)
Hemoglobin: 10.7 g/dL — ABNORMAL LOW (ref 13.0–17.0)
Lymphocytes Relative: 43 %
Lymphs Abs: 2 10*3/uL (ref 0.7–4.0)
MCH: 32.3 pg (ref 26.0–34.0)
MCHC: 33.6 g/dL (ref 30.0–36.0)
MCV: 96.1 fL (ref 78.0–100.0)
Monocytes Absolute: 0.4 10*3/uL (ref 0.1–1.0)
Monocytes Relative: 8 %
Neutro Abs: 2.1 10*3/uL (ref 1.7–7.7)
Neutrophils Relative %: 46 %
Platelets: 105 10*3/uL — ABNORMAL LOW (ref 150–400)
RBC: 3.31 MIL/uL — ABNORMAL LOW (ref 4.22–5.81)
RDW: 12.5 % (ref 11.5–15.5)
WBC: 4.6 10*3/uL (ref 4.0–10.5)

## 2015-09-20 LAB — HEPARIN INDUCED PLATELET AB (HIT ANTIBODY): Heparin Induced Plt Ab: 0.38 OD (ref 0.000–0.400)

## 2015-09-20 MED ORDER — FONDAPARINUX SODIUM 2.5 MG/0.5ML ~~LOC~~ SOLN
2.5000 mg | SUBCUTANEOUS | Status: DC
Start: 2015-09-20 — End: 2015-09-21
  Administered 2015-09-20: 2.5 mg via SUBCUTANEOUS
  Filled 2015-09-20 (×2): qty 0.5

## 2015-09-20 NOTE — Progress Notes (Signed)
Occupational Therapy Session Note  Patient Details  Name: Brad Mcgee MRN: [id number] Date of Birth: 09/03/1985  Today's Date: 09/20/2015 OT Individual Time: 0800-0829 OT Individual Time Calculation (min): 29 min    Skilled Therapeutic Interventions/Progress Updates:    Pt participated in skilled OT session prior to outing at 10AM focusing on dressing completion. Pt completed UB/LB dressing at EOB with quad cane provided but not used during standing with supervision for instruction on reaching back with L UE. Grooming and oral care completed standing at sink with supervision for questioning cues on weightbearing L UE. Pt required Min A for decreasing compensatory movement patterns with L UE during oral care with pt educated on methods to decrease compensatory movements. Pt was provided education on POC update with revised long term goals with verbalized understanding. Pt left at EOB with all needs within reach and girlfriend present.    Therapy Documentation Precautions:  Precautions Precautions: Fall Precaution Comments: watch BP, and O2 saturations on ATC Restrictions Weight Bearing Restrictions: No General:   Vital Signs: Therapy Vitals Temp: 98.5 F (36.9 C) Temp Source: Oral Pulse Rate: (!) 104 Resp: 18 BP: 133/71 mmHg Patient Position (if appropriate): Sitting Oxygen Therapy SpO2: 100 % O2 Device: Not Delivered    See Function Navigator for Current Functional Status.   Therapy/Group: Individual Therapy  [name] A [name] 09/20/2015, 3:19 PM

## 2015-09-20 NOTE — Progress Notes (Signed)
Recreational Therapy Discharge Summary Patient Details  Name: Brad Mcgee MRN: [id number] Date of Birth: 1985-12-02 Today's Date: 09/20/2015  Long term goals set: 1  Long term goals met: 1  Comments on progress toward goals: Pt has made great progress toward goal and has exceeded min assist level resulting in advancement of his discharge date per team reports.  Pt is discharging home with family to provide 24 hour supervision/assist on 09/23/15.  Education provided on energy conservation, activity analysis with potential adaptations and importance staying active and engaged in leisure activities.  Family has present observing throughout LOS.   Reasons for discharge: treatment goals met  Patient/family agrees with progress made and goals achieved: Yes  Jenese Mischke 09/20/2015, 12:26 PM

## 2015-09-20 NOTE — Progress Notes (Signed)
VASCULAR LAB PRELIMINARY  PRELIMINARY  PRELIMINARY  PRELIMINARY  Bilateral lower extremity venous duplex has been completed.    Left: Chronic thrombus is  noted in the peroneal vein .   Right: No evidence of DVT.   Bilateral: No evidence of superficial thrombosis.  No Baker's cyst.  Note :prior study on 08/31/15 -DVT idenified in left peroneal vein.  Called patient nurse gave result to Brad Mcgee @4 :oo pm.  Brad Mcgee, RVT, RDMS 09/20/2015, 3:48 PM

## 2015-09-20 NOTE — Progress Notes (Signed)
[facility] PHYSICAL MEDICINE & REHABILITATION     PROGRESS NOTE  Subjective/Complaints:  Pt sitting up in his chair, girlfriend at bedside. He is doing well.     ROS: Denies CP, SOB, pain, dysuria  Objective: Vital Signs: Blood pressure 124/74, pulse 93, temperature 98.6 F (37 C), temperature source Oral, resp. rate 16, height 5\' 8"  (1.727 m), weight 115.803 kg (255 lb 4.8 oz), SpO2 99 %. No results found.  Recent Labs  09/19/15 0536 09/20/15 0503  WBC 5.1 4.6  HGB 10.9* 10.7*  HCT 32.2* 31.8*  PLT 110* 105*    Recent Labs  09/18/15 0933 09/19/15 0536  NA 132* 133*  K 4.0 4.2  CL 101 102  GLUCOSE 130* 99  BUN 15 14  CREATININE 1.01 1.01  CALCIUM 9.6 9.2   CBG (last 3)  No results for input(s): GLUCAP in the last 72 hours.  Wt Readings from Last 3 Encounters:  09/20/15 115.803 kg (255 lb 4.8 oz)  09/01/15 126.3 kg (278 lb 7.1 oz)  08/01/15 135.172 kg (298 lb)    Physical Exam:  BP 124/74 mmHg  Pulse 93  Temp(Src) 98.6 F (37 C) (Oral)  Resp 16  Ht 5\' 8"  (1.727 m)  Wt 115.803 kg (255 lb 4.8 oz)  BMI 38.83 kg/m2  SpO2 99% Constitutional: He appears well-developed and well-nourished. No distress.  Obese HENT: Normocephalic and atraumatic.  Eyes: Conjunctivae and EOM are normal.   Neck: Stoma healed.  Cardiovascular: Regular rate and rhythm.   Respiratory: Effort normal. No accessory muscle usage or stridor. No respiratory distress.   GI: Soft. Bowel sounds are normal. He exhibits no distension. There is no tenderness.  Musculoskeletal: He exhibits no edema or tenderness. Left foot without any obvious changes.  Neurological: He is alert and oriented.  Motor: RUE/RLE: 5/5 proximal to distal LUE: 4+/5 shoulder abduction, 4+/5 elbow flexion/extension, 4/5 finger grip (improving) LLE: 4+/5 proximal to distal, apraxia. No increased tone noted.  Speech improving Skin: Skin is warm and dry. He is not diaphoretic.  Psychiatric: His mood and behavior  appear normal.    Assessment/Plan: 1. Functional deficits secondary to right basal ganglia hemorrhage which require 3+ hours per day of interdisciplinary therapy in a comprehensive inpatient rehab setting. Physiatrist is providing close team supervision and 24 hour management of active medical problems listed below. Physiatrist and rehab team continue to assess barriers to discharge/monitor patient progress toward functional and medical goals.  Function:  Bathing Bathing position Bathing activity did not occur: Refused Position: Systems developerhower  Bathing parts Body parts bathed by patient: Right arm, Left arm, Chest, Abdomen, Front perineal area, Buttocks, Right upper leg, Left upper leg, Right lower leg, Left lower leg Body parts bathed by helper: Back  Bathing assist Assist Level: Supervision or verbal cues      Upper Body Dressing/Undressing Upper body dressing   What is the patient wearing?: Pull over shirt/dress     Pull over shirt/dress - Perfomed by patient: Thread/unthread right sleeve, Thread/unthread left sleeve, Put head through opening, Pull shirt over trunk Pull over shirt/dress - Perfomed by helper: Pull shirt over trunk, Thread/unthread left sleeve        Upper body assist Assist Level: Supervision or verbal cues      Lower Body Dressing/Undressing Lower body dressing   What is the patient wearing?: Socks, Shoes Underwear - Performed by patient: Thread/unthread right underwear leg, Thread/unthread left underwear leg, Pull underwear up/down   Pants- Performed by patient: Thread/unthread right pants  leg, Thread/unthread left pants leg, Pull pants up/down Pants- Performed by helper: Pull pants up/down   Non-skid slipper socks- Performed by helper: Don/doff left sock, Don/doff right sock Socks - Performed by patient: Don/doff right sock, Don/doff left sock Socks - Performed by helper: Don/doff right sock, Don/doff left sock Shoes - Performed by patient: Don/doff right shoe,  Don/doff left shoe, Fasten right, Fasten left Shoes - Performed by helper: Don/doff right shoe, Don/doff left shoe, Fasten right, Fasten left (due to time constraints)          Lower body assist Assist for lower body dressing: Supervision or verbal cues      Toileting Toileting   Toileting steps completed by patient: Adjust clothing prior to toileting, Performs perineal hygiene Toileting steps completed by helper: Adjust clothing prior to toileting, Performs perineal hygiene, Adjust clothing after toileting Toileting Assistive Devices: Grab bar or rail  Toileting assist Assist level:  (mod A)   Transfers Chair/bed transfer   Chair/bed transfer method: Squat pivot Chair/bed transfer assist level: Supervision or verbal cues Chair/bed transfer assistive device: Armrests Mechanical lift: Landscape architect Ambulation activity did not occur: Safety/medical concerns   Max distance: 165 Assist level: Supervision or verbal cues   Wheelchair   Type: Manual Max wheelchair distance: 150 Assist Level: No help, No cues, assistive device, takes more than reasonable amount of time  Cognition Comprehension Comprehension assist level: Follows complex conversation/direction with extra time/assistive device  Expression Expression assist level: Expresses complex ideas: With extra time/assistive device  Social Interaction Social Interaction assist level: Interacts appropriately with others with medication or extra time (anti-anxiety, antidepressant).  Problem Solving Problem solving assist level: Solves complex problems: With extra time  Memory Memory assist level: Complete Independence: No helper     Medical Problem List and Plan: 1. Left hemiparesis, cognitive and functional deficitssecondary to right basal ganglia hemorrhage  Cont CIR, pt making continued functional gains  Resting hand splint, wrist cock up splint, PRAFO Fluoxetine started 6/19, increased to 20 on  6/30  2. Left peroneal DVT/Anticoagulation: IVC filter placed on 06/16   Lovenox  daily, changed to artixtra on 7/5 (prophylaxis)  U/S LE pending 3. Pain Management: tylenol prn.   -left foot pain--transient, no swelling or discoloration. Unclear source 4. Mood: LCSW to follow for evaluation and support 5. Neuropsych: This patient is not fully capable of making decisions on his own behalf. 6. Skin/Wound Care: Routine pressure relief measures.  7. Fluids/Electrolytes/Nutrition: Monitor intake on dysphagia 1, nectar liquids, advanced to regular diet.  8. VDRF s/p trach: Decannulated on 6/21  CPAP daily at bedtime   9. Acute on chronic CHF: Likely due to hypertonic saline. Check daily weights and montor I/O.  10. Hx of malignant HTN: Monitor BP.   Cont meds for now, stable 11. Leukocytosis due to MSSA bronchitis v/s PNA and Enterobacter [id number]    Cont to monitor temps and for other signs of infection. Completed rocephin 6/18. 12. ABLA:   Hb 10.7 on 7/5 13. Enterobacter UTI: Completed rocephin 06/18.   Discontinued foley 14. Morbid obesity  Body mass index is 38.83 kg/(m^2).  Diet and exercise education  Cont to encourage weight loss to increase endurance and promote overall health 15. Hyponatremia  133 on 7/4  Thiazide d/ced   Stable/improving, will cont to monitor 16. AKI  Creatinine 1.01 on 7/4  (stable)  Will encourage fluid intake 17. Thrombocytopenia:  Possibly related to Lovenox, d/ced on 7/5, Arixtra started on 7/5  HIT  pending  Plts 105 on 7/5  Will cont to monitor  LOS (Days) 19 A FACE TO FACE EVALUATION WAS PERFORMED  Josilynn Losh Karis Jubanil Traycen Goyer 09/20/2015 10:13 AM

## 2015-09-20 NOTE — Progress Notes (Signed)
Speech Language Pathology Daily Session Note  Patient Details  Name: Brad Mcgee MRN: [id number] Date of Birth: 10/14/1985  Today's Date: 09/20/2015 SLP Individual Time: 0900-1000 SLP Individual Time Calculation (min): 60 min  Short Term Goals: Week 3: SLP Short Term Goal 1 (Week 3): Pt will demonstrate functional problem solving for moderately-complex tasks with supervision verbal cues.  SLP Short Term Goal 2 (Week 3): Pt will recall novel information with Min A verbal cues. SLP Short Term Goal 3 (Week 3): Pt complete functional math tasks with Mod I. SLP Short Term Goal 4 (Week 3): Pt tolerate trials of regular diet without signs of dysphagia to demonstrate readiness for diet upgrade.  Skilled Therapeutic Interventions: Skilled treatment session focused on cognitive goals. Patient independently recalled all his current medications and their functions with Mod I and organized a 2 time per day pill box with Mod I. Patient was re-administered the MoCA (8.1) and scored 29/30 points with deficits in visual-spatial tasks. Both the patient and his girlfriend were also educated in regards to vocal hygiene. Both the patient and his girlfriend report he is at his baseline level of cognitive functioning. Therefore, SLP plans to f/u for one more session for diet tolerance prior to discharge. Patient left sitting EOB with girlfriend present. Continue with current plan of care.   Function:  Cognition Comprehension Comprehension assist level: Follows complex conversation/direction with extra time/assistive device  Expression   Expression assist level: Expresses complex ideas: With extra time/assistive device  Social Interaction Social Interaction assist level: Interacts appropriately with others with medication or extra time (anti-anxiety, antidepressant).  Problem Solving Problem solving assist level: Solves complex problems: With extra time  Memory Memory assist level: Complete Independence: No helper     Pain No/Denies Pain   Therapy/Group: Individual Therapy  Brad Mcgee 09/20/2015, 4:06 PM

## 2015-09-20 NOTE — Progress Notes (Signed)
Physical Therapy Session Note  Patient Details  Name: Brad Mcgee MRN: [id number] Date of Birth: [birth year]  Today's Date: 09/20/2015 PT Individual Time: 1310-1340 PT Individual Time Calculation (min): 30 min   Short Term Goals: Week 3:  PT Short Term Goal 1 (Week 3): Pt will demonstrate community level w/c mobility with supervision PT Short Term Goal 2 (Week 3): Pt will ambulate in community setting with mod assist and LRAD PT Short Term Goal 3 (Week 3): Pt will demonstrate dynamic standing balance with min assist PT Short Term Goal 4 (Week 3): Pt will self direct transfers with family with 50% cues from therapist.   Skilled Therapeutic Interventions/Progress Updates:    Pt received resting in bed, no c/o pain and agreeable to therapy session.  Pt amb to bathroom and performed clothing management and toileting with supervision.  Pt amb throughout unit, max distance >300', with small based quad cane and distant supervision.  Pt performed car transfer at simulated SUV height with supervision.  Pt amb up/down ramp with quad cane and distant supervision.  PT provided family education to pt's mother regarding providing appropriate supervision for ambulation and she demonstrated as pt ambulated back to room.  Pt left with mother in room, call bell in reach and needs met.   Therapy Documentation Precautions:  Precautions Precautions: Fall Precaution Comments: watch BP, and O2 saturations on ATC Restrictions Weight Bearing Restrictions: No   See Function Navigator for Current Functional Status.   Therapy/Group: Individual Therapy  [name] 09/20/2015, 4:49 PM

## 2015-09-20 NOTE — Progress Notes (Addendum)
Occupational Therapy Session Note  Patient Details  Name: Brad Mcgee MRN: [id number] Date of Birth: 09/18/1985  Today's Date: 09/20/2015 OT Individual Time: 1000-1205 OT Individual Time Calculation (min): 125 min    Short Term Goals: Week 3:  OT Short Term Goal 1 (Week 3): Pt will complete UB dressing with Mod I  OT Short Term Goal 2 (Week 3): Pt will complete LB dressing with Min A OT Short Term Goal 3 (Week 3): Pt will complete bathing in shower with supervision  OT Short Term Goal 4 (Week 3): Pt will complete toileting with Mod I   Skilled Therapeutic Interventions/Progress Updates:   Pt participating in community integration/mobility outing with overall supervision at [employer]. OT incorporated use of narrow base quad cane with mobility tasks being overall supervision. Pt given list of items to obtain in target. As items obtained, pt problem solving how to best reach for item and place into basket. Pt utilizing L UE and R UE for reaching depending on item needing to be obtained. Pt ambulated up to 300' with quad cane and supervision and alerting therapist when needing to rest and at which point he would then propel wheelchair with mod I . Pt able to calculate total of items with min questioning cues correctly. Pt ambulated into public rest room with quad cane and performed toilet transfer with supervision only. Pt educated on energy conservation and safety risks within community environment. Pt returning to hospital and transported to room via wheelchair for time management. Call bell and all needed items within reach.   Therapy Documentation Precautions:  Precautions Precautions: Fall Precaution Comments: watch BP, and O2 saturations on ATC Restrictions Weight Bearing Restrictions: No Vital Signs: Therapy Vitals Temp: 98.5 F (36.9 C) Temp Source: Oral Pulse Rate: (!) 104 Resp: 18 BP: 133/71 mmHg Patient Position (if appropriate): Sitting Oxygen Therapy SpO2: 100 % O2 Device:  Not Delivered  See Function Navigator for Current Functional Status.   Therapy/Group: concurrent  Lowella Gripittman, [name] L 09/20/2015, 4:28 PM

## 2015-09-20 NOTE — Progress Notes (Signed)
Recreational Therapy Session Note  Patient Details  Name: Brad Mcgee MRN: [id number] Date of Birth: 07/01/1985 Today's Date: 09/20/2015  Pain: no c/o Skilled Therapeutic Interventions/Progress Updates: Pt participated in community reintegration/outing to [employer] at overall supervision ambulatory/w/c level.  Goals focused on safe functional mobility on various community surface types, identification & negotiation of obstacles, problem solving, math skills, self monitoring for rest breaks, accessing public restroom and energy conservation.  See outing goal sheet in shadow chart for full details.  Therapy/Group: [employer]   Xochitl Egle 09/20/2015, 9:09 AM
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
## 2015-09-20 NOTE — Plan of Care (Signed)
Problem: RH Ambulation Goal: LTG Patient will ambulate in controlled environment (PT) LTG: Patient will ambulate in a controlled environment, # of feet with assistance (PT).  Upgraded due to pt progress  Problem: RH Balance Goal: LTG Patient will maintain dynamic sitting balance (PT) LTG: Patient will maintain dynamic sitting balance with assistance during mobility activities (PT)  Upgraded due to pt progress Goal: LTG Patient will maintain dynamic standing balance (PT) LTG: Patient will maintain dynamic standing balance with assistance during mobility activities (PT)  Upgraded due to pt progress  Problem: RH Car Transfers Goal: LTG Patient will perform car transfers with assist (PT) LTG: Patient will perform car transfers with assistance (PT).  Upgraded due to pt progress  Problem: RH Ambulation Goal: LTG Patient will ambulate in home environment (PT) LTG: Patient will ambulate in home environment, # of feet with assistance (PT).  Upgraded due to pt progress  Problem: RH Stairs Goal: LTG Patient will ambulate up and down stairs w/assist (PT) LTG: Patient will ambulate up and down # of stairs with assistance (PT)  Upgraded due to pt progress

## 2015-09-21 ENCOUNTER — Inpatient Hospital Stay (HOSPITAL_COMMUNITY): Payer: 59 | Admitting: Speech Pathology

## 2015-09-21 ENCOUNTER — Inpatient Hospital Stay (HOSPITAL_COMMUNITY): Payer: 59 | Admitting: Occupational Therapy

## 2015-09-21 ENCOUNTER — Inpatient Hospital Stay (HOSPITAL_COMMUNITY): Payer: 59 | Admitting: Physical Therapy

## 2015-09-21 LAB — CBC WITH DIFFERENTIAL/PLATELET
Basophils Absolute: 0 10*3/uL (ref 0.0–0.1)
Basophils Relative: 0 %
Eosinophils Absolute: 0.1 10*3/uL (ref 0.0–0.7)
Eosinophils Relative: 2 %
HCT: 29.6 % — ABNORMAL LOW (ref 39.0–52.0)
Hemoglobin: 10.1 g/dL — ABNORMAL LOW (ref 13.0–17.0)
Lymphocytes Relative: 41 %
Lymphs Abs: 2 10*3/uL (ref 0.7–4.0)
MCH: 32.3 pg (ref 26.0–34.0)
MCHC: 34.1 g/dL (ref 30.0–36.0)
MCV: 94.6 fL (ref 78.0–100.0)
Monocytes Absolute: 0.4 10*3/uL (ref 0.1–1.0)
Monocytes Relative: 8 %
Neutro Abs: 2.4 10*3/uL (ref 1.7–7.7)
Neutrophils Relative %: 50 %
Platelets: 114 10*3/uL — ABNORMAL LOW (ref 150–400)
RBC: 3.13 MIL/uL — ABNORMAL LOW (ref 4.22–5.81)
RDW: 12.7 % (ref 11.5–15.5)
WBC: 4.9 10*3/uL (ref 4.0–10.5)

## 2015-09-21 MED ORDER — FAMOTIDINE 40 MG PO TABS: 40.0000 mg | ORAL_TABLET | Freq: Every day | ORAL | Status: DC

## 2015-09-21 MED ORDER — DOCUSATE SODIUM 50 MG/5ML PO LIQD: 100.0000 mg | Freq: Two times a day (BID) | ORAL | Status: DC | PRN

## 2015-09-21 MED ORDER — FLUOXETINE HCL 20 MG PO CAPS: 20.0000 mg | ORAL_CAPSULE | Freq: Every day | ORAL | Status: DC

## 2015-09-21 MED ORDER — ENOXAPARIN (LOVENOX) PATIENT EDUCATION KIT
PACK | Freq: Once | Status: AC
  Administered 2015-09-21: 15:00:00
  Filled 2015-09-21: qty 1

## 2015-09-21 MED ORDER — ADULT MULTIVITAMIN W/MINERALS CH: 1.0000 | ORAL_TABLET | Freq: Every day | ORAL | Status: DC

## 2015-09-21 MED ORDER — LISINOPRIL 20 MG PO TABS: 20.0000 mg | ORAL_TABLET | Freq: Two times a day (BID) | ORAL | Status: DC

## 2015-09-21 MED ORDER — ENOXAPARIN SODIUM 60 MG/0.6ML ~~LOC~~ SOLN: 60.0000 mg | SUBCUTANEOUS | Status: DC

## 2015-09-21 MED ORDER — ENOXAPARIN SODIUM 60 MG/0.6ML ~~LOC~~ SOLN
60.0000 mg | SUBCUTANEOUS | Status: DC
  Administered 2015-09-21 – 2015-09-22 (×2): 60 mg via SUBCUTANEOUS
  Filled 2015-09-21: qty 0.6

## 2015-09-21 MED ORDER — SACCHAROMYCES BOULARDII 250 MG PO CAPS: 250.0000 mg | ORAL_CAPSULE | Freq: Two times a day (BID) | ORAL | Status: DC

## 2015-09-21 NOTE — Progress Notes (Signed)
Speech Language Pathology Daily Session Note  Patient Details  Name: Brad Mcgee MRN: [id number] Date of Birth: 08/18/1985  Today's Date: 09/21/2015 SLP Individual Time: 1130-1200 SLP Individual Time Calculation (min): 30 min  Short Term Goals: Week 3: SLP Short Term Goal 1 (Week 3): Pt will demonstrate functional problem solving for moderately-complex tasks with supervision verbal cues.  SLP Short Term Goal 2 (Week 3): Pt will recall novel information with Min A verbal cues. SLP Short Term Goal 3 (Week 3): Pt complete functional math tasks with Mod I. SLP Short Term Goal 4 (Week 3): Pt tolerate trials of regular diet without signs of dysphagia to demonstrate readiness for diet upgrade.  Skilled Therapeutic Interventions: Skilled treatment session focused on dysphagia goals. SLP facilitated session by providing skilled observation with lunch meal of regular textures with thin liquids. Patient was Mod I for tray set-up, independently utilized his LUE with self-feeding tasks, demonstrated efficient mastication and consumed meal without overt s/s of aspiration with Mod I for use of swallowing compensatory strategies. Recommend patient continue current diet. Patient left sitting EOB with mom present. Continue with current plan of care.    Function:  Eating Eating   Modified Consistency Diet: No Eating Assist Level: No help, No cues           Cognition Comprehension Comprehension assist level: Follows complex conversation/direction with extra time/assistive device  Expression   Expression assist level: Expresses complex ideas: With extra time/assistive device  Social Interaction Social Interaction assist level: Interacts appropriately with others with medication or extra time (anti-anxiety, antidepressant).  Problem Solving Problem solving assist level: Solves complex problems: With extra time  Memory Memory assist level: Complete Independence: No helper    Pain No/Denies Pain    Therapy/Group: Individual Therapy  Brenda Samano 09/21/2015, 12:18 PM

## 2015-09-21 NOTE — Progress Notes (Signed)
Social Work Patient ID: Brad Mcgee, male   DOB: 1985-06-23, 30 y.o.   MRN: [id number]   Met with pt, mother and father yesterday to review team conference.  All very pleased with progress he has made which is exceptional!  Agree with team recommendation of moving d/c date up to 7/8 and to rec for OPtxs.  Asking about CPAP - checking into the process for this referral.  Continue to follow.  Surya Schroeter, LCSW

## 2015-09-21 NOTE — Progress Notes (Signed)
Physical Therapy Session Note  Patient Details  Name: Brad MaxwellShawon M Renbarger MRN: [id number] Date of Birth: 07/26/1985  Today's Date: 09/21/2015 PT Individual Time: 1400-1515 PT Individual Time Calculation (min): 75 min   Short Term Goals: Week 3:  PT Short Term Goal 1 (Week 3): Pt will demonstrate community level w/c mobility with supervision PT Short Term Goal 2 (Week 3): Pt will ambulate in community setting with mod assist and LRAD PT Short Term Goal 3 (Week 3): Pt will demonstrate dynamic standing balance with min assist PT Short Term Goal 4 (Week 3): Pt will self direct transfers with family with 50% cues from therapist.   Skilled Therapeutic Interventions/Progress Updates:    Pt received resting in bed with no c/o pain and agreeable to therapy session.  Pt amb throughout unit max distance >300' to ortho gym with small based quad cane and distant supervision.  Pt amb from ortho gym to therapy gym with distant supervision.  Pt engaged in NMR task for LUE completing puzzle task with increased time.  Pt able to place all pieces of puzzle using both hands intermittently and used LUE to place pieces back in container.  PT instructed pt in stair negotiation x 4 steps with R rail leading with LLE for strengthening.  Pt amb back to room at end of session with mother providing supervision.    Therapy Documentation Precautions:  Precautions Precautions: Fall Precaution Comments: watch BP, and O2 saturations on ATC Restrictions Weight Bearing Restrictions: No   See Function Navigator for Current Functional Status.   Therapy/Group: Individual Therapy  Ladora DanielCaitlin E Penven-Crew 09/21/2015, 4:40 PM

## 2015-09-21 NOTE — Progress Notes (Signed)
[facility] PHYSICAL MEDICINE & REHABILITATION     PROGRESS NOTE  Subjective/Complaints:  Pt laying in bed with CPAP.  He states he is doing well by giving a "thumb's up".  ROS: Denies CP, SOB, pain, dysuria  Objective: Vital Signs: Blood pressure 137/88, pulse 94, temperature 98.3 F (36.8 C), temperature source Oral, resp. rate 18, height  (1.727 m), weight 119.16 kg (262 lb 11.2 oz), SpO2 100 %. No results found.  Recent Labs  09/20/15 0503 09/21/15 0531  WBC 4.6 4.9  HGB 10.7* 10.1*  HCT 31.8* 29.6*  PLT 105* 114*    Recent Labs  09/18/15 0933 09/19/15 0536  NA 132* 133*  K 4.0 4.2  CL 101 102  GLUCOSE 130* 99  BUN 15 14  CREATININE 1.01 1.01  CALCIUM 9.6 9.2   CBG (last 3)  No results for input(s): GLUCAP in the last 72 hours.  Wt Readings from Last 3 Encounters:  09/21/15 119.16 kg (262 lb 11.2 oz)  09/01/15 126.3 kg (278 lb 7.1 oz)  08/01/15 135.172 kg (298 lb)    Physical Exam:  BP 137/88 mmHg  Pulse 94  Temp(Src) 98.3 F (36.8 C) (Oral)  Resp 18  Ht  (1.727 m)  Wt 119.16 kg (262 lb 11.2 oz)  BMI 39.95 kg/m2  SpO2 100% Constitutional: He appears well-developed and well-nourished. No distress.  Obese HENT: Normocephalic and atraumatic.  Eyes: Conjunctivae and EOM are normal.   Neck: Stoma healed.  Cardiovascular: Regular rate and rhythm.   Respiratory: Effort normal. No accessory muscle usage or stridor. No respiratory distress.   GI: Soft. Bowel sounds are normal. He exhibits no distension. There is no tenderness.  Musculoskeletal: He exhibits no edema or tenderness. Left foot without any obvious changes.  Neurological: He is alert and oriented.  Motor: RUE/RLE: 5/5 proximal to distal LUE: 4+/5 shoulder abduction, 4+/5 elbow flexion/extension, 4/5 finger grip (improving) LLE: 4+/5 proximal to distal, apraxia. No increased tone noted.  Speech improving Skin: Skin is warm and dry. He is not diaphoretic.  Psychiatric: His mood  and behavior appear normal.    Assessment/Plan: 1. Functional deficits secondary to right basal ganglia hemorrhage which require 3+ hours per day of interdisciplinary therapy in a comprehensive inpatient rehab setting. Physiatrist is providing close team supervision and 24 hour management of active medical problems listed below. Physiatrist and rehab team continue to assess barriers to discharge/monitor patient progress toward functional and medical goals.  Function:  Bathing Bathing position Bathing activity did not occur: Refused Position: Systems developer parts bathed by patient: Right arm, Left arm, Chest, Abdomen, Front perineal area, Buttocks, Right upper leg, Left upper leg, Right lower leg, Left lower leg Body parts bathed by helper: Back  Bathing assist Assist Level: Supervision or verbal cues      Upper Body Dressing/Undressing Upper body dressing   What is the patient wearing?: Pull over shirt/dress     Pull over shirt/dress - Perfomed by patient: Thread/unthread right sleeve, Thread/unthread left sleeve, Put head through opening, Pull shirt over trunk Pull over shirt/dress - Perfomed by helper: Pull shirt over trunk, Thread/unthread left sleeve        Upper body assist Assist Level: Supervision or verbal cues      Lower Body Dressing/Undressing Lower body dressing   What is the patient wearing?: Pants, Socks, Shoes Underwear - Performed by patient: Thread/unthread right underwear leg, Thread/unthread left underwear leg, Pull underwear up/down   Pants- Performed by patient:  Thread/unthread right pants leg, Thread/unthread left pants leg, Pull pants up/down Pants- Performed by helper: Pull pants up/down   Non-skid slipper socks- Performed by helper: Don/doff left sock, Don/doff right sock Socks - Performed by patient: Don/doff right sock, Don/doff left sock Socks - Performed by helper: Don/doff right sock, Don/doff left sock Shoes - Performed by patient:  Don/doff right shoe, Don/doff left shoe, Fasten right Shoes - Performed by helper: Fasten left          Lower body assist Assist for lower body dressing: Supervision or verbal cues      Toileting Toileting   Toileting steps completed by patient: Adjust clothing prior to toileting, Performs perineal hygiene Toileting steps completed by helper: Adjust clothing prior to toileting, Performs perineal hygiene, Adjust clothing after toileting Toileting Assistive Devices: Grab bar or rail  Toileting assist Assist level: Supervision or verbal cues   Transfers Chair/bed transfer   Chair/bed transfer method: Ambulatory Chair/bed transfer assist level: Supervision or verbal cues Chair/bed transfer assistive device: Armrests, Cane Mechanical lift: Landscape architecttedy   Locomotion Ambulation Ambulation activity did not occur: Safety/medical concerns   Max distance: 300+' Assist level: Supervision or verbal cues   Wheelchair   Type: Manual Max wheelchair distance: 150 Assist Level: No help, No cues, assistive device, takes more than reasonable amount of time  Cognition Comprehension Comprehension assist level: Follows complex conversation/direction with extra time/assistive device  Expression Expression assist level: Expresses complex ideas: With extra time/assistive device  Social Interaction Social Interaction assist level: Interacts appropriately with others with medication or extra time (anti-anxiety, antidepressant).  Problem Solving Problem solving assist level: Solves complex problems: With extra time  Memory Memory assist level: Complete Independence: No helper     Medical Problem List and Plan: 1. Left hemiparesis, cognitive and functional deficitssecondary to right basal ganglia hemorrhage  Cont CIR, pt making continued functional gains  Resting hand splint, wrist cock up splint, PRAFO Fluoxetine started 6/19, increased to 20 on 6/30  2. Left peroneal DVT/Anticoagulation:  IVC filter placed on 06/16   Lovenox 60mg  daily, changed to artixtra on 7/5 (prophylaxis due to recent ICH), changed back to Lovenox after negative HIT  U/S LE on 7/5 showing chronic left peroneal DVT 3. Pain Management: tylenol prn.   -left foot pain--transient, no swelling or discoloration. Unclear source 4. Mood: LCSW to follow for evaluation and support 5. Neuropsych: This patient is not fully capable of making decisions on his own behalf. 6. Skin/Wound Care: Routine pressure relief measures.  7. Fluids/Electrolytes/Nutrition: Monitor intake on dysphagia 1, nectar liquids, advanced to regular diet.  8. VDRF s/p trach: Decannulated on 6/21  CPAP daily at bedtime   9. Acute on chronic CHF: Likely due to hypertonic saline. Check daily weights and montor I/O.  10. Hx of malignant HTN: Monitor BP.   Cont meds for now, stable 11. Leukocytosis due to MSSA bronchitis v/s PNA and Enterobacter ZOX:[id number]:Resolved    Cont to monitor temps and for other signs of infection. Completed rocephin 6/18. 12. ABLA:   Hb 10.1 on 7/6 13. Enterobacter UTI: Completed rocephin 06/18.   Discontinued foley 14. Morbid obesity  Body mass index is 39.95 kg/(m^2).  Diet and exercise education  Cont to encourage weight loss to increase endurance and promote overall health 15. Hyponatremia  133 on 7/4  Thiazide d/ced   Stable/improving, will cont to monitor 16. AKI  Creatinine 1.01 on 7/4  (stable)  Will encourage fluid intake 17. Thrombocytopenia:  Possibly related to Lovenox, d/ced on  7/5, Arixtra started on 7/5, changed back to Lovenox after negative HIT  HIT WNL  Plts 114 on 7/6 (slightly improved)  Will cont to monitor  LOS (Days) 20 A FACE TO FACE EVALUATION WAS PERFORMED  Belissa Kooy Karis Jubanil Dontez Hauss 09/21/2015 8:35 AM

## 2015-09-21 NOTE — Patient Care Conference (Signed)
Inpatient RehabilitationTeam Conference and Plan of Care Update Date: 09/20/2015   Time: 11:35  AM    Patient Name: Brad MaxwellShawon M Mink      Medical Record Number: [id number]  Date of Birth: 02/10/1986 Sex: Male         Room/Bed: 4W14C/4W14C-01 Payor Info: Payor: [employer] / Plan: [employer] OTHER / Product Type: *No Product type* /    Admitting Diagnosis: ICH  Admit Date/Time:  09/01/2015  6:05 PM Admission Comments: No comment available   Primary Diagnosis:  Basal ganglia hemorrhage (HCC) Principal Problem: Basal ganglia hemorrhage [facility](HCC)  Patient Active Problem List   Diagnosis Date Noted  . DVT prophylaxis   . Thrombocytopenia (HCC)   . Left hemiparesis (HCC)   . AKI (acute kidney injury) (HCC)   . Hyponatremia   . Dysphagia, post-stroke   . OSA (obstructive sleep apnea) suspected will need cpap when decannulated 09/05/2015  . Obesity hypoventilation syndrome (HCC)   . Tracheostomy care (HCC)   . Acute blood loss anemia   . Deep venous thrombosis (DVT) of left peroneal vein (HCC) 09/01/2015  . Dysphagia following nontraumatic intracerebral hemorrhage 09/01/2015  . Morbid obesity (HCC) 09/01/2015  . Nontraumatic acute hemorrhage of basal ganglia (HCC) 09/01/2015  . Tracheostomy in place [facility](HCC)   . Leukocytosis   . Fever, unspecified   . Right Basal ganglia hemorrhage (HCC)   . Acute respiratory failure (HCC)   . Acute respiratory failure with hypoxemia (HCC)   . Cytotoxic brain edema (HCC) 08/18/2015  . Brain herniation (HCC) 08/18/2015  . Accelerated hypertension 08/16/2015    Expected Discharge Date: Expected Discharge Date: 09/23/15  Team Members Present: Physician leading conference: Dr. Maryla MorrowAnkit Patel Social Worker Present: Amada JupiterLucy Nasean Zapf, LCSW Nurse Present: Chana Bodeeborah Sharp, RN PT Present: Katherine Mantleodney Wishart, PT OT Present: Roney MansJennifer Smith, OT SLP Present: Fae PippinMelissa Bowie, SLP PPS Coordinator present : Tora DuckMarie Noel, RN, CRRN     Current Status/Progress Goal Weekly  Team Focus  Medical   Left hemiparesis, cognitive and functional deficitssecondary to right basal ganglia hemorrhage, continues to improve  Improve mobility, transfers, Plts  See above   Bowel/Bladder   Continent bowel and bladder; anticipates needs  Remain continent, min assist  Monitor   Swallow/Nutrition/ Hydration   regular and thin with intermittent supervision   Mod I with least restrictive diet  monitor for tolerance and education    ADL's   LB self care with min A, UB self care with set up A, toilet transfer with overall supervision, toileting with supervision - min A,   goals upgraded to supervision - mod I overall  functional transfers, L NMR, self care retraining, pt/family education, d/c planning   Mobility   supervision overall  supervision/min assist overall, will likely upgrade to supervision/mod I  balance, gait with LRAD, community mobility, family education   Communication   Mod I   Mod I   recommend outpaient ENT follow up if vocal quality does not improve    Safety/Cognition/ Behavioral Observations  Supervision   Supervision- mod I  recall of new complex info for home management upon discharge    Pain   Denies pain, discomfort  Managed at goal 2/10  Monitor, assess for non-verbal s/s pain   Skin   Trach site closed  No s/s infection, no injury this admsission  Assess    Rehab Goals Patient on target to meet rehab goals: Yes *See Care Plan and progress notes for long and short-term goals.  Barriers to Discharge: mobility,  transfers, hyponatremia, AKI, thrombocytopenia    Possible Resolutions to Barriers:  Therapies, regular diet now, follow labs, follow HIT,     Discharge Planning/Teaching Needs:  Home with family to provide 24/7 assistance.  Teaching is ongoing.   Team Discussion:  Lovenox stopped/ begin Arixtra.  MD asks if pt can get CPAP set up - SW to follow up.  MD to d/c all bracing orders.  Reg diet and thin upgrade.  Communication is excellent  and ST expects to d/c early.  Many PT/OT goals upgraded to mod independent.  Recommend moving up d/c to this Sat and for pt to do OP therapies.  Revisions to Treatment Plan:  Many goals upgraded.  D/c date changed to 7/8.   Continued Need for Acute Rehabilitation Level of Care: The patient requires daily medical management by a physician with specialized training in physical medicine and rehabilitation for the following conditions: Daily direction of a multidisciplinary physical rehabilitation program to ensure safe treatment while eliciting the highest outcome that is of practical value to the patient.: Yes Daily medical management of patient stability for increased activity during participation in an intensive rehabilitation regime.: Yes Daily analysis of laboratory values and/or radiology reports with any subsequent need for medication adjustment of medical intervention for : Neurological problems;Pulmonary problems  Amarii Bordas 09/21/2015, 3:51 PM

## 2015-09-21 NOTE — Progress Notes (Addendum)
Occupational Therapy Session Note  Patient Details  Name: Brad Mcgee MRN: [id number] Date of Birth: 02/16/1986  Today's Date: 09/21/2015 OT Individual Time: [id number] OT Individual Time Calculation (min): 56 min    Skilled Therapeutic Interventions/Progress Updates:    Pt seen in morning session on day prior to graduation day and was eating breakfast. Pt reported already having bathed and dressed prior to OT arrival. Pt was provided education on methods for integrating L UE into self feeding tasks at home with verbalized understanding. After breakfast, pt donned socks and shoes with setup, and ambulated to therapy closet with narrow based quad cane to retrieve more gripper socks for room with supervision for safety with door opening. Pt completed grooming standing sinkside with L UE as gross assist for stabilizing items and turning on/off faucet. Pt stood for approximately 18 minutes without rest breaks. For remainder of tx, pt sat at EOB and completed UE exercises with use of beach ball in room. Exercises focused on bilateral deltoid and bicep strengthening x10 reps with instruction on proper technique to normalize coordination and increase strength. At end of session pt left at EOB with all needs within reach.    2nd Session 1:1 [phone] Pt seen after lunch for skilled OT tx focusing on IADL completion to return to meaningful roles after discharge. Pts mother was present during session in ADL apartment. Pt completed dish washing activity standing at sink with narrow based quad cane and supervision for instruction on safe completion of task at home with light and plastic items. "I won't be washing glass stuff for a long time." Pt actively incorporated L UE as gross assist for washing pans and plastic cups. Education was provided on activating hand extensors when wiping down countertops to balance muscles in hand. Pt returned to room with mother afterwards, was provided education on outpatient OT,  graduation day, and home exercises that pt can complete with use of large ball. Pts mother was provided education on proper technique of exercises to strength deltoid and bicep muscles. Pt left at EOB with all needs within reach and mother present.   Therapy Documentation Precautions:  Precautions Precautions: Fall Precaution Comments: watch BP, and O2 saturations on ATC Restrictions Weight Bearing Restrictions: No   Exercises:  Beach ball exercises, bilateral shoulder flexion x10 reps 4 sets and bilateral bicep flexion x10 reps 4 sets; ball toss focusing on coordinating bilateral UEs with pt able to catch and throw ball at EOB  Other Treatments:    See Function Navigator for Current Functional Status.   Therapy/Group: Individual Therapy    [name] A [name] 09/21/2015, 12:56 PM

## 2015-09-22 ENCOUNTER — Inpatient Hospital Stay (HOSPITAL_COMMUNITY): Payer: 59 | Admitting: Occupational Therapy

## 2015-09-22 ENCOUNTER — Inpatient Hospital Stay (HOSPITAL_COMMUNITY): Payer: 59 | Admitting: Physical Therapy

## 2015-09-22 LAB — CBC
HCT: 30.8 % — ABNORMAL LOW (ref 39.0–52.0)
Hemoglobin: 10.5 g/dL — ABNORMAL LOW (ref 13.0–17.0)
MCH: 32.2 pg (ref 26.0–34.0)
MCHC: 34.1 g/dL (ref 30.0–36.0)
MCV: 94.5 fL (ref 78.0–100.0)
Platelets: 130 10*3/uL — ABNORMAL LOW (ref 150–400)
RBC: 3.26 MIL/uL — ABNORMAL LOW (ref 4.22–5.81)
RDW: 12.6 % (ref 11.5–15.5)
WBC: 4.6 10*3/uL (ref 4.0–10.5)

## 2015-09-22 NOTE — Progress Notes (Signed)
Physical Therapy Session Note  Patient Details  Name: Brad Mcgee MRN: [id number] Date of Birth: 13-Aug-1985  Today's Date: 09/22/2015 PT Individual Time: 1030-1210 PT Individual Time Calculation (min): 100 min   Short Term Goals: Week 3:  PT Short Term Goal 1 (Week 3): Pt will demonstrate community level w/c mobility with supervision PT Short Term Goal 2 (Week 3): Pt will ambulate in community setting with mod assist and LRAD PT Short Term Goal 3 (Week 3): Pt will demonstrate dynamic standing balance with min assist PT Short Term Goal 4 (Week 3): Pt will self direct transfers with family with 50% cues from therapist.   Skilled Therapeutic Interventions/Progress Updates:   Pt received resting in bed and agreeable to therapy session.  Session focus on gait training with small based quad cane, stair negotiation, balance, NMR, and endurance.  Pt performs sit<>stand mod I and stand/pivot and ambulatory transfers from a variety of surfaces and performs car transfer with supervision with quad cane.  Pt ambulates throughout hospital, off unit, managing elevators and doors with quad cane and distant supervision.  Pt able to tolerate >15 minutes of activity with no rest break.  PT administered BERG Balance Scale and patient demonstrates significant fall risk as noted by score of 45/56 on Berg Balance Scale.  Pt performed 10 minutes on nustep at level 4 with no support splints for forced use, reciprocal stepping pattern, and overall cardiovascular endurance.  Pt returned to room at end of session and performed toileting with mod I.  Discussed with OT and signed off on patient to be up in room with quad cane.  Pt left sitting EOB with lunch tray set up, call bell in reach and needs met.     Therapy Documentation Precautions:  Precautions Precautions: Fall Precaution Comments: watch BP, and O2 saturations on ATC Restrictions Weight Bearing Restrictions: No   See Function Navigator for Current  Functional Status.   Therapy/Group: Individual Therapy  [name] 09/22/2015, 11:10 AM

## 2015-09-22 NOTE — Discharge Summary (Signed)
Physician Discharge Summary  Patient ID: [name] M Briones MRN: [id number] DOB/AGE: 30/03/1985 30 y.o.  Admit date: 09/01/2015 Discharge date: 09/23/2015  Discharge Diagnoses:  Principal Problem:   Right Basal ganglia hemorrhage (HCC) Active Problems:   Accelerated hypertension   Deep venous thrombosis (DVT) of left peroneal vein (HCC)   Dysphagia following nontraumatic intracerebral hemorrhage   Morbid obesity (HCC)   Nontraumatic acute hemorrhage of basal ganglia (HCC)   Acute blood loss anemia   Obesity hypoventilation syndrome (HCC)   AKI (acute kidney injury) (HCC)   Hyponatremia   Left hemiparesis (HCC)   Thrombocytopenia (HCC)   Discharged Condition: stable   Significant Diagnostic Studies: Ir Ivc Filter Plmt / S&i /img Guid/mod Sed  08/31/2015  INDICATION: DVT.  Intracranial hemorrhage. EXAM: IVC FILTER,INFERIOR VENA CAVOGRAM MEDICATIONS: None. ANESTHESIA/SEDATION: Fentanyl 25 mcg IV; Versed 1 mg IV Moderate Sedation Time:  22 minutes The patient was continuously monitored during the procedure by the interventional radiology nurse under my direct supervision. FLUOROSCOPY TIME:  Fluoroscopy Time: 3 minutes 12 seconds (319 mGy). COMPLICATIONS: None immediate. PROCEDURE: Informed written consent was obtained from the patient after a thorough discussion of the procedural risks, benefits and alternatives. All questions were addressed. Maximal Sterile Barrier Technique was utilized including caps, mask, sterile gowns, sterile gloves, sterile drape, hand hygiene and skin antiseptic. A timeout was performed prior to the initiation of the procedure. The right neck was prepped with Betadine in a sterile fashion, and a sterile drape was applied covering the operative field. A sterile gown and sterile gloves were used for the procedure. The right jugular vein was noted to be patent initially with ultrasound. Under sonographic guidance, a micropuncture needle was inserted into the right jugular  vein (Ultrasound image documentation was performed). It was removed over an 018 wire which was up-sized to a [place]vine[place]enson. The sheath was inserted over the wire and into the IVC. IVC venography was performed. The temporary filter was then deployed in the infrarenal IVC. The sheath was removed and hemostasis was achieved with direct pressure. FINDINGS: IVC and bilateral renal venography demonstrates renal vein inflow at L1 and no venous anomaly. Final image demonstrates an IVC filter in place with its tip at the lower L1 endplate. IMPRESSION: Successful infrarenal IVC filter placement. This is a temporary filter. It can be removed or remain in place to become permanent. Electronically Signed   By: Jolaine ClickArthur  Hoss M.D.   On: 08/31/2015 15:50   Dg Chest Port 1 View  08/26/2015  CLINICAL DATA:  30 year old male with a history of respiratory failure EXAM: PORTABLE CHEST 1 VIEW COMPARISON:  08/24/2015, [id number] FINDINGS: Cardiomediastinal silhouette unchanged with cardiomegaly. Unchanged position of tracheostomy tube. Unchanged position of enteric feeding tube, which terminates out of the field of view. Interval removal of IJ central catheter. Low lung volumes.  Retrocardiac opacity. No visualized pneumothorax. IMPRESSION: Low lung volumes with potential atelectasis/ consolidation at the left base. Interval removal of right IJ catheter. Unchanged tracheostomy tube and enteric feeding tube. Signed, Yvone NeuJaime S. Loreta AveWagner, DO [facility] Electronically Signed   By: Gilmer MorJaime  Wagner D.O.   On: 08/26/2015 08:23               Labs:  Basic Metabolic Panel:  Recent Labs Lab 09/19/15 0536  NA 133*  K 4.2  CL 102  CO2 23  GLUCOSE 99  BUN 14  CREATININE 1.01  CALCIUM 9.2    CBC:  Recent Labs Lab 09/19/15 0536 09/20/15  0503 09/21/15 0531 09/22/15 0541 09/23/15 0607  WBC 5.1 4.6 4.9 4.6 4.6  NEUTROABS 2.1 2.1 2.4  --   --   HGB 10.9* 10.7* 10.1* 10.5* 10.5*   HCT 32.2* 31.8* 29.6* 30.8* 30.9*  MCV 95.0 96.1 94.6 94.5 94.8  PLT 110* 105* 114* 130* 138*    CBG: No results for input(s): GLUCAP in the last 168 hours.  Brief HPI:   ABDULAI BLAYLOCK is a 30 y.o. male with history of hypertension was admitted to [facility] on 08/15/15 with acute onset of left sided weakness. CT head done revealing right basal ganglia infarct and he had decline in MS requiring intubation and transfer to [facility]. Bleed felt to be due to hypertensive emergency and he did have increase in bleed on follow up CT. CTA head and neck negative for stenosis or aneurysm. 2D echo with eF 50-55%, no wall abnormality, grade 2 diastolic dysfunction and no SOE. He was treated with hypertonic saline and required trach 06/7 due to difficulty with vent wean. He was extubated to ATC but continues to have copious secretions. He developed fever 6/11 therefore started on Vanc/Zosyn and blood cultures done negative to date. Urine culture positive for enterobacter and PCCM recommends narrowing antibiotics to rocephin. BLE dopplers done due to edema and revealed LLE peroneal DVT therefore  IVC filter was placed on 06/15. He was tolerating PMSV trials and was cleared to start dyshagia 1,nectar liquids. Patient with resultant left sided weakness with numbnes Therapy ongoing and CIR recommended for follow up therapy.    Hospital Course: COTE MAYABB was admitted to rehab 09/01/2015 for inpatient therapies to consist of PT, ST and OT at least three hours five days a week. Past admission physiatrist, therapy team and rehab RN have worked together to provide customized collaborative inpatient rehab. Respiratory status has been stable and he tolerated decannulation on 06/21 without difficulty.  Blood pressures have been monitored bid and is showing improved control.  Follow up labs done showed steady drop in sodium to 129 therefore thiazide was discontinued with recovery. His sodium has improved to 133 by discharge.  He  was maintained on ceftriaxone thorough 6/18 and foley was discontinued after admission. He has been voiding without difficulty and is continent of bowel and bladder.  CPAP was used to help manage OHS and he has been compliant with its use during this stay. He has been set up for outpatien sleep study on 08/01.   He has been educated dietary changes for weight loss and to promote healthy lifestyle. Fluoxetine was started on 06/19 and increased to 20 mg by 06/30.  He was maintained on lovenox during his stay and serial CBC shows thrombocytopenia is resolving.  HIT panel was negative. H/H is relatively stable and no signs of bleeding noted. Follow up dopplers were done  of 07/05 showed chronic thrombus in left peroneal vein. He is to continue Lovenox 60 mg daily for additional month and is to follow up with Dr. Allena Katz in the office for evaluation and input on duration of treatment. He was instructed to ambualte around his home every hour while awake. Po intake has been good and he was advanced to regular textures. He is tolerating regular diet without signs or symptoms of aspiration.  He has had improvement in LUE strength as well as decrease in LLE ataxia. He had progressed to modified independent to supervision level at discharge. He will continue to receive follow up Outpatient PT and OT at [facility] after discharge.    Rehab course: During patient's stay in rehab weekly team conferences were held to monitor patient's progress, set goals and discuss barriers to discharge. At admission, patient required total assist with mobility and basic self-care tasks. He had dysphonic speech with  Moderate cues needed for speech intelligibility and showed moderate oral and pharyngeal dysphagia. He showed mild deficits in problem solving and recall. He has had improvement in activity tolerance, balance, coordination as well as ability to compensate for deficits. He is has had improvement in functional use LUE  and  LLE  as well as improved awareness. He is able to complete ADL tasks at supervision to modified independent level. He is independent for transfers and able to ambulate with quad cane. He continues to have mild hoarseness but speech is 100% intelligible and he is able to make needs known.  He is able to complete complex and familiar tasks at modified independent level and is currently at baseline. No follow up ST recommended after discharge.  Family education was done with mother who will assist as needed after discharge.    Disposition: 01-Home or Self Care  Diet: Heart Healthy diet.   Special Instructions: 1. No driving. No strenuous activity. 2. Outpatient therapy to begin 07/13 at 1:15 pm.        Discharge Instructions    Ambulatory referral to Physical Medicine Rehab    Complete by:  As directed   Follow up after stroke/moderate complexity            Medication List    STOP taking these medications        naproxen 500 MG tablet  Commonly known as:  NAPROSYN      TAKE these medications        docusate 50 MG/5ML liquid  Commonly known as:  COLACE  Take 10 mLs (100 mg total) by mouth 2 (two) times daily as needed for mild constipation.     enoxaparin 60 MG/0.6ML injection  Commonly known as:  LOVENOX  Inject 0.6 mLs (60 mg total) into the skin daily.     famotidine 40 MG tablet  Commonly known as:  PEPCID  Take 1 tablet (40 mg total) by mouth daily.     FLUoxetine 20 MG capsule  Commonly known as:  PROZAC  Take 1 capsule (20 mg total) by mouth daily.     lisinopril 20 MG tablet  Commonly known as:  PRINIVIL,ZESTRIL  Take 1 tablet (20 mg total) by mouth 2 (two) times daily.     multivitamin with minerals Tabs tablet  Take 1 tablet by mouth daily.     saccharomyces boulardii 250 MG capsule  Commonly known as:  FLORASTOR  Take 1 capsule (250 mg total) by mouth 2 (two) times daily.       Follow-up Information    Follow up with Ankit Karis Juba.   Why:  office  will call you for appointment   Contact information:   [address] [place], [place] [phone]      Follow up with Sula Rumple, MD On 09/29/2015.   Specialty:  Family Medicine   Why:  @ 11:00 am   Contact information:   [address] [place] [address] [phone]       Follow up with SETHI,PRAMOD, MD. Call today.   Specialties:  Neurology, Radiology   Why:  for follow up in 3-4 weeks   Contact information:   [address]  [address] [place] [address] [phone]       Call Loura HaltBenjamin Jared Ditty, MD.   Specialty:  Neurosurgery   Why:  As needed   Contact information:   [address] [place] [address] [phone]       [facility] On 10/17/2015.   Why:  @ 8:00 pm - This office will mail information to your home with details   Contact information:   [address] [place], [place]  [address] [phone]       Signed: Jacquelynn CreeLove, Jaloni Davoli S 09/25/2015, 12:10 PM

## 2015-09-22 NOTE — Discharge Instructions (Signed)
Inpatient Rehab Discharge Instructions  Brad Mcgee Discharge date and time: 09/23/15   Activities/Precautions/ Functional Status: Activity: No lifting, driving, or strenuous exercise  till cleared by MD Diet: cardiac diet Wound Care: none needed   Functional status:  ___ No restrictions     ___ Walk up steps independently ___ 24/7 supervision/assistance   ___ Walk up steps with assistance ___ Intermittent supervision/assistance  ___ Bathe/dress independently ___ Walk with walker     _X_ Bathe/dress with supervision. ___ Walk Independently    ___ Shower independently ___ Walk with assistance    ___ Shower with assistance _X__ No alcohol     ___ Return to work/school ________     COMMUNITY REFERRALS UPON DISCHARGE:    Outpatient: PT     OT                   Agency:  [facility]      Phone: [phone]               Appointment Date/Time:  7/13 @ 1:15 (PT) and 2:45 (OT)  - please arrive at 12:45  Medical Equipment/Items Ordered: CPAP, quad cane, tub seat                                                     Agency/Supplier:  [facility] @ [phone]  Other:   TELEPHONE INTERVIEW WITH SOCIAL SECURITY DISABILITY @ 7/25 @ 10:15 AM   Special Instructions:   STROKE/TIA DISCHARGE INSTRUCTIONS SMOKING Cigarette smoking nearly doubles your risk of having a stroke & is the single most alterable risk factor  If you smoke or have smoked in the last 12 months, you are advised to quit smoking for your health.  Most of the excess cardiovascular risk related to smoking disappears within a year of stopping.  Ask you doctor about anti-smoking medications  [place] Quit Line: 1-800-QUIT NOW  Free Smoking Cessation Classes [phone]  CHOLESTEROL Know your levels; limit fat & cholesterol in your diet  Lipid Panel     Component Value Date/Time   TRIG 331* 08/22/2015 0526      Many patients benefit from treatment even if their cholesterol is at goal.  Goal: Total  Cholesterol (CHOL) less than 160  Goal:  Triglycerides (TRIG) less than 150  Goal:  HDL greater than 40  Goal:  LDL (LDLCALC) less than 100   BLOOD PRESSURE American Stroke Association blood pressure target is less that 120/80 mm/Hg  Your discharge blood pressure is:  BP: (!) 133/91 mmHg  Monitor your blood pressure  Limit your salt and alcohol intake  Many individuals will require more than one medication for high blood pressure  DIABETES (A1c is a blood sugar average for last 3 months) Goal HGBA1c is under 7% (HBGA1c is blood sugar average for last 3 months)  Diabetes: No known diagnosis of diabetes    Lab Results  Component Value Date   HGBA1C 5.6 09/02/2015     Your HGBA1c can be lowered with medications, healthy diet, and exercise.  Check your blood sugar as directed by your physician  Call your physician if you experience unexplained or low blood sugars.  PHYSICAL ACTIVITY/REHABILITATION Goal is 30 minutes at least 4 days per week  Activity: No driving, Therapies: See above Return to work: To be decided  on follow up.   Activity decreases your risk of heart attack and stroke and makes your heart stronger.  It helps control your weight and blood pressure; helps you relax and can improve your mood.  Participate in a regular exercise program.  Talk with your doctor about the best form of exercise for you (dancing, walking, swimming, cycling).  DIET/WEIGHT Goal is to maintain a healthy weight  Your discharge diet is: Diet regular Room service appropriate?: Yes; Fluid consistency:: Thin  liquids Your height is:  Height: 5\' 8"  (172.7 cm) Your current weight is: Weight: 117.1 kg (258 lb 2.5 oz) Your Body Mass Index (BMI) is:  BMI (Calculated): 39.3  Following the type of diet specifically designed for you will help prevent another stroke.  Your goal weight is: 164 lbs  Your goal Body Mass Index (BMI) is 19-24.  Healthy food habits can help reduce 3 risk factors for  stroke:  High cholesterol, hypertension, and excess weight.  RESOURCES Stroke/Support Group:  Call [phone]   STROKE EDUCATION PROVIDED/REVIEWED AND GIVEN TO PATIENT Stroke warning signs and symptoms How to activate emergency medical system (call 911). Medications prescribed at discharge. Need for follow-up after discharge. Personal risk factors for stroke. Pneumonia vaccine given:  Flu vaccine given:  My questions have been answered, the writing is legible, and I understand these instructions.  I will adhere to these goals & educational materials that have been provided to me after my discharge from the hospital.     My questions have been answered and I understand these instructions. I will adhere to these goals and the provided educational materials after my discharge from the hospital.  Patient/Caregiver Signature _______________________________ Date __________  Clinician Signature _______________________________________ Date __________  Please bring this form and your medication list with you to all your follow-up doctor's appointments.

## 2015-09-22 NOTE — Progress Notes (Signed)
RT NOTE:  Pt will place CPAP on when ready to sleep.

## 2015-09-22 NOTE — Progress Notes (Signed)
Occupational Therapy Discharge Summary  Patient Details  Name: Brad Mcgee MRN: [id number] Date of Birth: 22-Feb-1986  Today's Date: 09/22/2015   OT Individual Time: 0350-0938 OT Individual Time Calculation (min) 59 minutes  Patient has met 11 of 11 long term goals due to improved activity tolerance, improved balance, postural control, ability to compensate for deficits, functional use of  LEFT upper extremity, improved awareness and improved coordination.  Patient to discharge at overall supervision-Mod I level.  Patient's family to provide the necessary supervision assistance at discharge.    Recommendation:  Patient will benefit from ongoing skilled OT services in outpatient OT to continue to advance functional skills with L UE in the area of ADLs, IADLs, and leisure pursuits   Equipment: Shower chair   Reasons for discharge: Pt has achieved therapy goals   Patient/family agrees with progress made and goals achieved: Yes   Skilled OT Intervention: Today pt seen for graduation day with father present. Pt completed BADL routine in shower, gathering necessary items with use of narrow based quad cane. Pt completed functional shower transfer with use of shower chair and simulated threshold to emulate home environment. Pt able to transfer with supervision. Pt completed UB/LB bathing with supervision standing as needed for perineal care for 1 safety instruction. Pt completed UB dressing with Mod I and LB dressing with supervision for instruction on sitting to thread LEs into pants due to pt attempting to complete while standing against a wall. Pt completed toileting transfer with Mod I and grooming/oral care at sink while standing with Mod I. Family was educated throughout ADL regarding DME needs, home setup, and exercises/activities to improve L UE functional abilities. Father Brad Mcgee) verbalized understanding and engaged with pt and OTR in ball tossing activity with pt requiring vcs to straighten  L elbow. Stretches completed during recovery periods to ease soreness. Pt was educated on goal achievement and progress with POC with verbalized feelings of pride.  Pt left with PT and family at end of session. Social work informed of pts DME needs.   OT Discharge Precautions/Restrictions    General   Vital Signs Therapy Vitals Temp: 98.4 F (36.9 C) Temp Source: Oral Pulse Rate: (!) 105 Resp: 18 BP: (!) 133/91 mmHg Patient Position (if appropriate): Sitting Oxygen Therapy SpO2: 100 % O2 Device: Not Delivered Pain   ADL ADL Eating: Supervision/safety Where Assessed-Eating: Edge of bed Grooming: Modified independent Where Assessed-Grooming: Sitting at sink Upper Body Bathing: Supervision/safety Where Assessed-Upper Body Bathing: Shower Lower Body Bathing: Supervision/safety Where Assessed-Lower Body Bathing: Shower Upper Body Dressing: Modified independent (Device) Where Assessed-Upper Body Dressing: Chair Lower Body Dressing: Supervision/safety Where Assessed-Lower Body Dressing: Chair Toileting: Modified independent Where Assessed-Toileting: Glass blower/designer: Distant supervision Armed forces technical officer Method: Human resources officer: Close supervison Clinical cytogeneticist Method: Optometrist: Civil engineer, contracting with back Social research officer, government: Close supervision Social research officer, government Method: Heritage manager: Civil engineer, contracting with back Vision/Perception  Vision- History Baseline Vision/History: No visual deficits Patient Visual Report: No change from baseline Vision- Assessment Vision Assessment?: No apparent visual deficits Ocular Range of Motion: Within Functional Limits Tracking/Visual Pursuits: Able to track stimulus in all quads without difficulty  Cognition Overall Cognitive Status: Within Functional Limits for tasks assessed Arousal/Alertness: Awake/alert Orientation Level: Oriented X4 Attention:  Focused;Sustained;Selective Focused Attention: Appears intact Sustained Attention: Appears intact Selective Attention: Appears intact Alternating Attention: Appears intact Memory: Appears intact Awareness: Appears intact Problem Solving: Appears intact Executive Function: Self Monitoring;Self Correcting Self Monitoring: Appears intact Self Correcting:  Appears intact Safety/Judgment: Appears intact Sensation Sensation Light Touch: Appears Intact Hot/Cold: Appears Intact Proprioception: Appears Intact Coordination Gross Motor Movements are Fluid and Coordinated: Yes Fine Motor Movements are Fluid and Coordinated: Yes Motor  Motor Motor: Other (comment) (L UE deficits persist though function is improving) Mobility     Trunk/Postural Assessment  Cervical Assessment Cervical Assessment: Within Functional Limits Thoracic Assessment Thoracic Assessment: Within Functional Limits Lumbar Assessment Lumbar Assessment: Within Functional Limits Postural Control Postural Control: Within Functional Limits  Balance Balance Balance Assessed: Yes Static Sitting Balance Static Sitting - Level of Assistance: 7: Independent Dynamic Sitting Balance Dynamic Sitting - Balance Support: No upper extremity supported Dynamic Sitting - Level of Assistance: 6: Modified independent (Device/Increase time) Static Standing Balance Static Standing - Balance Support: No upper extremity supported Static Standing - Level of Assistance: 5: Stand by assistance Dynamic Standing Balance Dynamic Standing - Balance Support: No upper extremity supported Dynamic Standing - Level of Assistance: 5: Stand by assistance Extremity/Trunk Assessment RUE Assessment RUE Assessment: Within Functional Limits RUE AROM (degrees) Overall AROM Right Upper Extremity: Within functional limits for tasks performed LUE AROM (degrees) Overall AROM Left Upper Extremity: Deficits See Function Navigator for Current Functional  Status.  Brad Mcgee A Brad Mcgee 09/22/2015, 3:50 PM

## 2015-09-22 NOTE — Progress Notes (Signed)
[place] PHYSICAL MEDICINE & REHABILITATION     PROGRESS NOTE  Subjective/Complaints:  Pt states he is doing well.  He is pleased with his progress and looks forward to going home tomorrow.   ROS: Denies CP, SOB, pain, dysuria  Objective: Vital Signs: Blood pressure 153/95, pulse 97, temperature 98.8 F (37.1 C), temperature source Axillary, resp. rate 20, height  (1.727 m), weight 117.1 kg (258 lb 2.5 oz), SpO2 100 %. No results found.  Recent Labs  09/21/15 0531 09/22/15 0541  WBC 4.9 4.6  HGB 10.1* 10.5*  HCT 29.6* 30.8*  PLT 114* 130*   No results for input(s): NA, K, CL, GLUCOSE, BUN, CREATININE, CALCIUM in the last 72 hours.  Invalid input(s): CO CBG (last 3)  No results for input(s): GLUCAP in the last 72 hours.  Wt Readings from Last 3 Encounters:  09/22/15 117.1 kg (258 lb 2.5 oz)  09/01/15 126.3 kg (278 lb 7.1 oz)  08/01/15 135.172 kg (298 lb)    Physical Exam:  BP 153/95 mmHg  Pulse 97  Temp(Src) 98.8 F (37.1 C) (Axillary)  Resp 20  Ht  (1.727 m)  Wt 117.1 kg (258 lb 2.5 oz)  BMI 39.26 kg/m2  SpO2 100% Constitutional: He appears well-developed and well-nourished. No distress.  Obese HENT: Normocephalic and atraumatic.  Eyes: Conjunctivae and EOM are normal.   Neck: Stoma healed.  Cardiovascular: Regular rate and rhythm.   Respiratory: Effort normal. No accessory muscle usage or stridor. No respiratory distress.   GI: Soft. Bowel sounds are normal. He exhibits no distension. There is no tenderness.  Musculoskeletal: He exhibits no edema or tenderness. Left foot without any obvious changes.  Neurological: He is alert and oriented.  Motor: RUE/RLE: 5/5 proximal to distal LUE: 4+/5 shoulder abduction, 4+/5 elbow flexion/extension, 4/5 finger grip (improving) LLE: 4+/5 proximal to distal, apraxia. No increased tone noted.  Speech continues to improve Skin: Skin is warm and dry. He is not diaphoretic.  Psychiatric: His mood and  behavior appear normal.    Assessment/Plan: 1. Functional deficits secondary to right basal ganglia hemorrhage which require 3+ hours per day of interdisciplinary therapy in a comprehensive inpatient rehab setting. Physiatrist is providing close team supervision and 24 hour management of active medical problems listed below. Physiatrist and rehab team continue to assess barriers to discharge/monitor patient progress toward functional and medical goals.  Function:  Bathing Bathing position Bathing activity did not occur: Refused Position: Systems developer parts bathed by patient: Right arm, Left arm, Chest, Abdomen, Front perineal area, Buttocks, Right upper leg, Left upper leg, Right lower leg, Left lower leg, Back Body parts bathed by helper: Back  Bathing assist Assist Level: Supervision or verbal cues      Upper Body Dressing/Undressing Upper body dressing   What is the patient wearing?: Pull over shirt/dress     Pull over shirt/dress - Perfomed by patient: Thread/unthread right sleeve, Thread/unthread left sleeve, Put head through opening, Pull shirt over trunk Pull over shirt/dress - Perfomed by helper: Pull shirt over trunk, Thread/unthread left sleeve        Upper body assist Assist Level: No help, No cues      Lower Body Dressing/Undressing Lower body dressing   What is the patient wearing?: Underwear, Pants, Non-skid slipper socks, Shoes Underwear - Performed by patient: Thread/unthread right underwear leg, Thread/unthread left underwear leg, Pull underwear up/down   Pants- Performed by patient: Thread/unthread right pants leg, Thread/unthread left pants leg, Pull  pants up/down Pants- Performed by helper: Pull pants up/down Non-skid slipper socks- Performed by patient: Don/doff right sock, Don/doff left sock Non-skid slipper socks- Performed by helper: Don/doff left sock, Don/doff right sock Socks - Performed by patient: Don/doff right sock, Don/doff left  sock Socks - Performed by helper: Don/doff right sock, Don/doff left sock Shoes - Performed by patient: Don/doff right shoe, Don/doff left shoe, Fasten right, Fasten left Shoes - Performed by helper: Fasten left          Lower body assist Assist for lower body dressing: Supervision or verbal cues      Toileting Toileting   Toileting steps completed by patient: Adjust clothing prior to toileting, Performs perineal hygiene, Adjust clothing after toileting Toileting steps completed by helper: Performs perineal hygiene, Adjust clothing after toileting Toileting Assistive Devices: Grab bar or rail  Toileting assist Assist level: No help/no cues   Transfers Chair/bed transfer   Chair/bed transfer method: Ambulatory, Stand pivot Chair/bed transfer assist level: Supervision or verbal cues Chair/bed transfer assistive device: Cane, Armrests Mechanical lift: Landscape architecttedy   Locomotion Ambulation Ambulation activity did not occur: Safety/medical concerns   Max distance: >500' Assist level: Supervision or verbal cues   Wheelchair Wheelchair activity did not occur: N/A (pt ambulatory on unit) Type: Manual Max wheelchair distance: 150 Assist Level: No help, No cues, assistive device, takes more than reasonable amount of time  Cognition Comprehension Comprehension assist level: Follows complex conversation/direction with no assist  Expression Expression assist level: Expresses complex ideas: With no assist  Social Interaction Social Interaction assist level: Interacts appropriately with others - No medications needed.  Problem Solving Problem solving assist level: Solves complex problems: Recognizes & self-corrects  Memory Memory assist level: Complete Independence: No helper     Medical Problem List and Plan: 1. Left hemiparesis, cognitive and functional deficitssecondary to right basal ganglia hemorrhage  Cont CIR, pt making continued functional gains  Resting hand splint, wrist cock up  splint, PRAFO now d/ced due to significant improvement Fluoxetine started 6/19, increased to 20 on 6/30  2. Left peroneal DVT/Anticoagulation: IVC filter placed on 06/16   Lovenox 60mg  daily, changed to artixtra on 7/5 (prophylaxis due to recent ICH), changed back to Lovenox after negative HIT  U/S LE on 7/5 showing chronic left peroneal DVT 3. Pain Management: tylenol prn.   -left foot pain--transient, no swelling or discoloration. Unclear source 4. Mood: LCSW to follow for evaluation and support 5. Neuropsych: This patient is not fully capable of making decisions on his own behalf. 6. Skin/Wound Care: Routine pressure relief measures.  7. Fluids/Electrolytes/Nutrition: Monitor intake on dysphagia 1, nectar liquids, advanced to regular diet.  8. VDRF s/p trach: Decannulated on 6/21  CPAP daily at bedtime   9. Acute on chronic CHF: Likely due to hypertonic saline. Check daily weights and montor I/O.  10. Hx of malignant HTN: Monitor BP.   Cont meds for now, stable 11. Leukocytosis due to MSSA bronchitis v/s PNA and Enterobacter WGN:[id number]:Resolved    Cont to monitor temps and for other signs of infection. Completed rocephin 6/18. 12. ABLA:   Hb 10.4 on 7/7 13. Enterobacter UTI: Completed rocephin 06/18.   Discontinued foley 14. Morbid obesity  Body mass index is 39.26 kg/(m^2).  Diet and exercise education  Cont to encourage weight loss to increase endurance and promote overall health 15. Hyponatremia  133 on 7/4  Thiazide d/ced   Stable/improving, will cont to monitor 16. AKI  Creatinine 1.01 on 7/4  (stable)  Will  encourage fluid intake 17. Thrombocytopenia:  Possibly related to Lovenox, d/ced on 7/5, Arixtra started on 7/5, changed back to Lovenox after negative HIT  HIT WNL  Plts 130 on 7/7 (slightly improved)  Will cont to monitor  LOS (Days) 21 A FACE TO FACE EVALUATION WAS PERFORMED  [place] Karis Jubanil Chauntay Paszkiewicz 09/22/2015 1:14 PM

## 2015-09-22 NOTE — Progress Notes (Signed)
Social Work  Discharge Note  The overall goal for the admission was met for:   Discharge location: Yes - home with mother and other family members providing assistance as needed  Length of Stay: Yes - 22 days  Discharge activity level: Yes - actually surpassed initial goals and met supervision/ mod independent goals  Home/community participation: Yes  Services provided included: MD, RD, PT, OT, SLP, RN, TR, Pharmacy, Neuropsych and SW  Financial Services: Private Insurance: [facility]  Follow-up services arranged: Outpatient: PT, OT @ [facility], DME: [facility], tub seat and CPAP via [place], Other: assisted with SSD application and set up telephone interview and Patient/Family has no preference for HH/DME agencies  Comments (or additional information):  Patient/Family verbalized understanding of follow-up arrangements: Yes  Individual responsible for coordination of the follow-up plan: pt  Confirmed correct DME delivered: Kacy Hegna 09/22/2015    Kendrew Paci

## 2015-09-22 NOTE — Progress Notes (Signed)
Physical Therapy Discharge Summary  Patient Details  Name: Brad Mcgee MRN: [id number] Date of Birth: 12/18/1985  Today's Date: 09/22/2015 PT Individual Time: 1300-1330 PT Individual Time Calculation (min): 30 min    Patient has met 12 of 12 long term goals due to improved activity tolerance, improved balance, improved postural control, increased strength, ability to compensate for deficits, functional use of  left upper extremity and left lower extremity, improved attention, improved awareness and improved coordination.  Patient to discharge at an ambulatory level Modified Independent.   Patient's care partner is independent to provide the necessary supervision at d/c.    Recommendation:  Patient will benefit from ongoing skilled PT services in outpatient setting to continue to advance safe functional mobility, address ongoing impairments in balance, activity tolerance, functional use of LUE/LLE, and postural control, and minimize fall risk.  Equipment: narrow based quad cane  Reasons for discharge: treatment goals met  Patient/family agrees with progress made and goals achieved: Yes   Skilled Therapeutic Intervention: Pt received resting in bed, agreeable to therapy session and no c/o pain.  Session focus on balance and gait training.  PT instructed pt in alternating toe taps on 4" step 2 trials until fatigued with RUE support on cane first trial and no UE support on second trial.  Gait training x160' with no AD for increased challenge to balance with close supervision.  PT provided pt with OTAGO level B HEP to perform at home in between [facility] and outpatient therapy.  Reviewed with pt's family and provided tips to increase challenge for exercises if they become to easy.  All verbalized understanding.  Provided pt with Mod I sign for room.  Pt left sitting EOB with call bell in reach and needs met.   PT Discharge Precautions/Restrictions Precautions Precautions: Fall Vital  Signs  Pain Pain Assessment Pain Assessment: 0-10 Vision/Perception     Cognition Overall Cognitive Status: Within Functional Limits for tasks assessed Arousal/Alertness: Awake/alert Orientation Level: Oriented X4 Focused Attention: Appears intact Sustained Attention: Appears intact Selective Attention: Appears intact Alternating Attention: Appears intact Memory: Appears intact Awareness: Appears intact Problem Solving: Appears intact Safety/Judgment: Appears intact Sensation Sensation Light Touch: Appears Intact Coordination Gross Motor Movements are Fluid and Coordinated: Yes Fine Motor Movements are Fluid and Coordinated:  (midlly decreased on L compared to R but improving daily) Finger Nose Finger Test: normal on RUE, decreased speed on L Motor  Motor Motor: Other (comment) Motor - Discharge Observations: hemiparesis on L improving, decreased postural control improving  Mobility Bed Mobility Bed Mobility: Supine to Sit;Sit to Supine Rolling Right: 6: Modified independent (Device/Increase time) Rolling Left: 6: Modified independent (Device/Increase time) Supine to Sit: 6: Modified independent (Device/Increase time) Sit to Supine: 6: Modified independent (Device/Increase time) Transfers Transfers: Yes Sit to Stand: 6: Modified independent (Device/Increase time) Stand to Sit: 6: Modified independent (Device/Increase time) Stand Pivot Transfers: 5: Supervision Squat Pivot Transfers: 5: Supervision Locomotion     Trunk/Postural Assessment  Cervical Assessment Cervical Assessment:  (mild forward head) Thoracic Assessment Thoracic Assessment:  (rounded shoulders, IR UEs) Lumbar Assessment Lumbar Assessment: Within Functional Limits Postural Control Postural Control: Within Functional Limits  Balance Balance Balance Assessed: Yes Standardized Balance Assessment Standardized Balance Assessment: Berg Balance Test Berg Balance Test Sit to Stand: Able to stand  without using hands and stabilize independently Standing Unsupported: Able to stand safely 2 minutes Sitting with Back Unsupported but Feet Supported on Floor or Stool: Able to sit safely and securely 2 minutes Stand to  Sit: Sits safely with minimal use of hands Transfers: Able to transfer safely, minor use of hands Standing Unsupported with Eyes Closed: Able to stand 10 seconds safely Standing Ubsupported with Feet Together: Able to place feet together independently and stand 1 minute safely From Standing, Reach Forward with Outstretched Arm: Can reach confidently >25 cm (10") From Standing Position, Pick up Object from Floor: Able to pick up shoe, needs supervision From Standing Position, Turn to Look Behind Over each Shoulder: Turn sideways only but maintains balance Turn 360 Degrees: Able to turn 360 degrees safely but slowly Standing Unsupported, Alternately Place Feet on Step/Stool: Able to complete >2 steps/needs minimal assist Standing Unsupported, One Foot in Front: Able to plae foot ahead of the other independently and hold 30 seconds Standing on One Leg: Able to lift leg independently and hold equal to or more than 3 seconds Total Score: 45 Static Sitting Balance Static Sitting - Level of Assistance: 7: Independent Dynamic Sitting Balance Dynamic Sitting - Balance Support: During functional activity;No upper extremity supported Dynamic Sitting - Level of Assistance: 6: Modified independent (Device/Increase time) Sitting balance - Comments: sitting edge of bed to don shoes Static Standing Balance Static Standing - Balance Support: No upper extremity supported;During functional activity Static Standing - Level of Assistance: 5: Stand by assistance Dynamic Standing Balance Dynamic Standing - Balance Support: No upper extremity supported;During functional activity Dynamic Standing - Level of Assistance: 5: Stand by assistance Extremity Assessment      RLE Assessment RLE  Assessment: Within Functional Limits RLE AROM (degrees) Overall AROM Right Lower Extremity: Within functional limits for tasks assessed RLE Strength RLE Overall Strength: Within Functional Limits for tasks assessed Right Hip Flexion: 5/5 Right Knee Flexion: 5/5 Right Knee Extension: 5/5 Right Ankle Dorsiflexion: 5/5 Right Ankle Plantar Flexion: 5/5 LLE Assessment LLE Assessment: Within Functional Limits LLE AROM (degrees) Overall AROM Left Lower Extremity: Within functional limits for tasks assessed LLE Strength LLE Overall Strength: Within Functional Limits for tasks assessed Left Hip Flexion: 4+/5 Left Knee Flexion: 4+/5 Left Knee Extension: 4+/5 Left Ankle Dorsiflexion: 4+/5 Left Ankle Plantar Flexion: 4+/5   See Function Navigator for Current Functional Status.  Rodderick Holtzer E Penven-Crew 09/22/2015, 11:09 AM

## 2015-09-22 NOTE — Progress Notes (Signed)
Occupational Therapy Session Note  Patient Details  Name: Brad Mcgee MRN: [id number] Date of Birth: [birth year]  Today's Date: 09/22/2015 OT Individual Time: 6213-0865 OT Individual Time Calculation (min): 30 min   Short Term Goals: Week 1:  OT Short Term Goal 1 (Week 1): Pt will complete toilet transfer with max A OT Short Term Goal 1 - Progress (Week 1): Met OT Short Term Goal 2 (Week 1): Pt will dress UB with mod A OT Short Term Goal 2 - Progress (Week 1): Met OT Short Term Goal 3 (Week 1): Pt will carry over hemi-dressing technique during ADL session with min questioning cues OT Short Term Goal 3 - Progress (Week 1): Met OT Short Term Goal 4 (Week 1): Pt will maintain static sitting balance with mod A in prep for functional task OT Short Term Goal 4 - Progress (Week 1): Met   Week 2:  OT Short Term Goal 1 (Week 2): Pt will complete toileting with the assist of 1 person in order to decrease level of assist with self care. OT Short Term Goal 1 - Progress (Week 2): Met OT Short Term Goal 2 (Week 2): Pt will perform bathing from TTB with min A for sitting balance. OT Short Term Goal 2 - Progress (Week 2): Met OT Short Term Goal 3 (Week 2): Pt will perform LB dressing with assist of 1 person in order to decrease level of assist with self care.  OT Short Term Goal 3 - Progress (Week 2): Met OT Short Term Goal 4 (Week 2): Pt will perform sit <>stand from wheelchair at sink during self care tasks with mod A in order to decrease assist with functional mobility. OT Short Term Goal 4 - Progress (Week 2): Met   Week 3:  OT Short Term Goal 1 (Week 3): Pt will complete UB dressing with Mod I  OT Short Term Goal 2 (Week 3): Pt will complete LB dressing with Min A OT Short Term Goal 3 (Week 3): Pt will complete bathing in shower with supervision  OT Short Term Goal 4 (Week 3): Pt will complete toileting with Mod I   Skilled Therapeutic Interventions/Progress Updates:  Patient found in dayroom  with family members. Pt willing to work with therapist. Pt ambulated with quad cane from dayroom to therapy gym, once in therapy gym pt sat edge of mat. Engaged pt in LUE hand ROM exercises using flash cards. Pt then engaged in fine motor task of putting small beads on string; increased difficulty with this and pt with max spillage. Pt ambulated back to room and therapist left pt with family, pt is mod I within room.   Therapy Documentation Precautions:  Precautions Precautions: Fall Precaution Comments: watch BP, and O2 saturations on ATC Restrictions Weight Bearing Restrictions: No  Vital Signs: Therapy Vitals Temp: 98.8 F (37.1 C) Temp Source: Axillary Pulse Rate: 97 Resp: 20 BP: (!) 153/95 mmHg Patient Position (if appropriate): Lying Oxygen Therapy SpO2: 100 % O2 Device: Not Delivered  See Function Navigator for Current Functional Status.  Therapy/Group: Individual Therapy  Chrys Racer , MS, OTR/L, CLT   09/22/2015, 2:42 PM

## 2015-09-22 NOTE — Progress Notes (Addendum)
Speech Language Pathology Discharge Summary  Patient Details  Name: Brad Mcgee MRN: [id number] Date of Birth: 12/12/1985   Patient has met 7 of 7 long term goals.  Patient to discharge at overall Modified Independent level.   Reasons goals not met: N/A   Clinical Impression/Discharge Summary: Patient has made excellent gains and has met 7 of 7 LTG's this admission due to improved swallowing and cognitive function as well as functional communication. Currently, patient is consuming regular textures with thin liquids without overt s/s of aspiration and is Mod I for use of swallowing compensatory strategies. Patient continues to demonstrate a mild hoarse vocal quality but is 100% intelligible at the conversation level and can independently make his needs known. Patient is also overall Mod I to complete complex and familiar tasks safely in regards to recall, problem solving, attention and awareness. Family education is complete and both the family and the patient report the patient is at his baseline level of functioning, therefore, skilled f/u SLP services are not warranted at this time. All educated on importance of requesting a SLP referral if needed once at home and possible need for ENT f/u in ~3-4 months if the patient's voice has not improved and. All verbalized understanding and agreement. Patient will discharge home with assistance from family.   Care Partner:  Caregiver Able to Provide Assistance: Yes  Type of Caregiver Assistance: Physical  Recommendation:  None      Equipment: N/A   Reasons for discharge: Treatment goals met   Patient/Family Agrees with Progress Made and Goals Achieved: Yes     [place], [place] 09/22/2015, 6:48 AM

## 2015-09-23 LAB — CBC
HCT: 30.9 % — ABNORMAL LOW (ref 39.0–52.0)
Hemoglobin: 10.5 g/dL — ABNORMAL LOW (ref 13.0–17.0)
MCH: 32.2 pg (ref 26.0–34.0)
MCHC: 34 g/dL (ref 30.0–36.0)
MCV: 94.8 fL (ref 78.0–100.0)
Platelets: 138 10*3/uL — ABNORMAL LOW (ref 150–400)
RBC: 3.26 MIL/uL — ABNORMAL LOW (ref 4.22–5.81)
RDW: 12.7 % (ref 11.5–15.5)
WBC: 4.6 10*3/uL (ref 4.0–10.5)

## 2015-09-23 NOTE — Progress Notes (Signed)
[facility]     PROGRESS NOTE  Subjective/Complaints:  Pt laying in bed.  Girlfriend at bedside.  Pt notes he had some edema in his left hand and foot yesterday, but it resolved.  He is ready to go home.   ROS: Denies CP, SOB, pain, dysuria  Objective: Vital Signs: Blood pressure 152/93, pulse 96, temperature 98.2 F (36.8 C), temperature source Oral, resp. rate 20, height 5\' 8"  (1.727 m), weight 118.4 kg (261 lb 0.4 oz), SpO2 100 %. No results found.  Recent Labs  09/22/15 0541 09/23/15 0607  WBC 4.6 4.6  HGB 10.5* 10.5*  HCT 30.8* 30.9*  PLT 130* 138*   No results for input(s): NA, K, CL, GLUCOSE, BUN, CREATININE, CALCIUM in the last 72 hours.  Invalid input(s): CO CBG (last 3)  No results for input(s): GLUCAP in the last 72 hours.  Wt Readings from Last 3 Encounters:  09/23/15 118.4 kg (261 lb 0.4 oz)  09/01/15 126.3 kg (278 lb 7.1 oz)  08/01/15 135.172 kg (298 lb)    Physical Exam:  BP 152/93 mmHg  Pulse 96  Temp(Src) 98.2 F (36.8 C) (Oral)  Resp 20  Ht 5\' 8"  (1.727 m)  Wt 118.4 kg (261 lb 0.4 oz)  BMI 39.70 kg/m2  SpO2 100% Constitutional: He appears well-developed and well-nourished. No distress.  Obese HENT: Normocephalic and atraumatic.  Eyes: Conjunctivae and EOM are normal.   Neck: Stoma healed.  Cardiovascular: Regular rate and rhythm.   Respiratory: Effort normal. No accessory muscle usage or stridor. No respiratory distress.   GI: Soft. Bowel sounds are normal. He exhibits no distension. There is no tenderness.  Musculoskeletal: He exhibits no edema or tenderness. Left foot without any obvious changes.  Neurological: He is alert and oriented.  Motor: RUE/RLE: 5/5 proximal to distal LUE: 4+/5 shoulder abduction, 4+/5 elbow flexion/extension, 4+/5 finger grip (improving) LLE: 4+/5 proximal to distal, apraxia (improving). No increased tone noted.  Speech continues to improve Skin: Skin is warm and dry. He is  not diaphoretic.  Psychiatric: His mood and behavior appear normal.    Assessment/Plan: 1. Functional deficits secondary to right basal ganglia hemorrhage which require 3+ hours per day of interdisciplinary therapy in a comprehensive inpatient rehab setting. Physiatrist is providing close team supervision and 24 hour management of active medical problems listed below. Physiatrist and rehab team continue to assess barriers to discharge/monitor patient progress toward functional and medical goals.  Function:  Bathing Bathing position Bathing activity did not occur: Refused Position: Systems developer parts bathed by patient: Right arm, Left arm, Chest, Abdomen, Front perineal area, Buttocks, Right upper leg, Left upper leg, Right lower leg, Left lower leg, Back Body parts bathed by helper: Back  Bathing assist Assist Level: Supervision or verbal cues      Upper Body Dressing/Undressing Upper body dressing   What is the patient wearing?: Pull over shirt/dress     Pull over shirt/dress - Perfomed by patient: Thread/unthread right sleeve, Thread/unthread left sleeve, Put head through opening, Pull shirt over trunk Pull over shirt/dress - Perfomed by helper: Pull shirt over trunk, Thread/unthread left sleeve        Upper body assist Assist Level: No help, No cues      Lower Body Dressing/Undressing Lower body dressing   What is the patient wearing?: Underwear, Pants, Non-skid slipper socks, Shoes Underwear - Performed by patient: Thread/unthread right underwear leg, Thread/unthread left underwear leg, Pull underwear up/down  Pants- Performed by patient: Thread/unthread right pants leg, Thread/unthread left pants leg, Pull pants up/down Pants- Performed by helper: Pull pants up/down Non-skid slipper socks- Performed by patient: Don/doff right sock, Don/doff left sock Non-skid slipper socks- Performed by helper: Don/doff left sock, Don/doff right sock Socks - Performed by  patient: Don/doff right sock, Don/doff left sock Socks - Performed by helper: Don/doff right sock, Don/doff left sock Shoes - Performed by patient: Don/doff right shoe, Don/doff left shoe, Fasten right, Fasten left Shoes - Performed by helper: Fasten left          Lower body assist Assist for lower body dressing: Supervision or verbal cues      Toileting Toileting   Toileting steps completed by patient: Adjust clothing prior to toileting, Adjust clothing after toileting Toileting steps completed by helper: Performs perineal hygiene Toileting Assistive Devices: Grab bar or rail  Toileting assist Assist level: No help/no cues   Transfers Chair/bed transfer   Chair/bed transfer method: Ambulatory, Stand pivot Chair/bed transfer assist level: Supervision or verbal cues Chair/bed transfer assistive device: Cane, Armrests Mechanical lift: Landscape architect Ambulation activity did not occur: Safety/medical concerns   Max distance: >500' Assist level: Supervision or verbal cues   Wheelchair Wheelchair activity did not occur: N/A (pt ambulatory on unit) Type: Manual Max wheelchair distance: 150 Assist Level: No help, No cues, assistive device, takes more than reasonable amount of time  Cognition Comprehension Comprehension assist level: Follows complex conversation/direction with no assist  Expression Expression assist level: Expresses complex ideas: With no assist  Social Interaction Social Interaction assist level: Interacts appropriately with others - No medications needed.  Problem Solving Problem solving assist level: Solves complex problems: Recognizes & self-corrects  Memory Memory assist level: Complete Independence: No helper     Medical Problem List and Plan: 1. Left hemiparesis, cognitive and functional deficitssecondary to right basal ganglia hemorrhage  D/c today  Resting hand splint, wrist cock up splint, PRAFO now d/ced due to significant  improvement Fluoxetine started 6/19, increased to 20 on 6/30  2. Left peroneal DVT/Anticoagulation: IVC filter placed on 06/16   Lovenox  daily, changed to artixtra on 7/5 (prophylaxis due to recent ICH), changed back to Lovenox after negative HIT  U/S LE on 7/5 showing chronic left peroneal DVT 3. Pain Management: tylenol prn.   -left foot pain--transient, no swelling or discoloration. Unclear source 4. Mood: LCSW to follow for evaluation and support 5. Neuropsych: This patient is not fully capable of making decisions on his own behalf. 6. Skin/Wound Care: Routine pressure relief measures.  7. Fluids/Electrolytes/Nutrition: Monitor intake on dysphagia 1, nectar liquids, advanced to regular diet.  8. VDRF s/p trach: Decannulated on 6/21  CPAP daily at bedtime   9. Acute on chronic CHF: Likely due to hypertonic saline. Check daily weights and montor I/O.  10. Hx of malignant HTN: Monitor BP.   Cont meds for now, stable 11. Leukocytosis due to MSSA bronchitis v/s PNA and Enterobacter [id number]    Cont to monitor temps and for other signs of infection. Completed rocephin 6/18. 12. ABLA:   Hb 10.5 on 7/8 13. Enterobacter UTI: Completed rocephin 06/18.   Discontinued foley 14. Morbid obesity  Body mass index is 39.7 kg/(m^2).  Diet and exercise education  Cont to encourage weight loss to increase endurance and promote overall health 15. Hyponatremia  133 on 7/4  Thiazide d/ced   Stable/improving, 16. AKI  Creatinine 1.01 on 7/4  (stable)  Encouraged fluid intake 17.  Thrombocytopenia:  Possibly related to Lovenox, d/ced on 7/5, Arixtra started on 7/5, changed back to Lovenox after negative HIT  HIT WNL  Plts 138 on 7/8 (slightly improved)  LOS (Days) 22 A FACE TO FACE EVALUATION WAS PERFORMED  Giuseppe Duchemin Karis Jubanil Yalexa Blust 09/23/2015 9:58 AM

## 2015-09-23 NOTE — Progress Notes (Signed)
7/8//17 0930 nursing Patient discharged to home with quad cane  (pt prefers to walk). Renee accompanied patient together with family members. Per patient discharged orders were given yesterday. No further questions.

## 2015-09-25 ENCOUNTER — Ambulatory Visit: Payer: 59 | Admitting: Occupational Therapy

## 2015-09-25 ENCOUNTER — Ambulatory Visit: Payer: 59 | Admitting: Physical Therapy

## 2015-09-27 ENCOUNTER — Telehealth: Payer: Self-pay | Admitting: *Deleted

## 2015-09-27 NOTE — Telephone Encounter (Signed)
Contact first made with Brad Mcgee's mother due to his home phone # was not correct.  Brad Mcgee was with her and number has been updated.  1. Are you/is patient experiencing any problems since coming home? Are there any questions regarding any aspect of care?No problems 2. Are there any questions regarding medications administration/dosing? Are meds being taken as prescribed? Patient should review meds with caller to confirm He is taking medications as prescribed and family member is giving lovenox injection. 3. Have there been any falls? No 4. Has Home Health been to the house and/or have they contacted you? If not, have you tried to contact them? Can we help you contact them? Going to outpatient therapy 5. Are bowels and bladder emptying properly? Are there any unexpected incontinence issues? If applicable, is patient following bowel/bladder programs? No problems 6. Any fevers, problems with breathing, unexpected pain? No 7. Are there any skin problems or new areas of breakdown?No 8. Has the patient/family member arranged specialty MD follow up (ie cardiology/neurology/renal/surgical/etc)?  Can we help arrange? No, reviewed need to set appt with Dr Marlis EdelsonSethi's office (they have number) and appt given for Dr Allena KatzPatel today 9. Does the patient need any other services or support that we can help arrange?No 10. Are caregivers following through as expected in assisting the patient?Yes             Has the patient quit smoking, drinking alcohol, or using drugs as recommended?N/A      Appointment with Dr Allena KatzPatel 10/05/15 @ 10:00 arriving at 9:30 Office address reviewed with his mother as they will be bringing him to appt.

## 2015-09-28 ENCOUNTER — Encounter (HOSPITAL_COMMUNITY): Payer: Self-pay | Admitting: Emergency Medicine

## 2015-09-28 ENCOUNTER — Emergency Department (HOSPITAL_COMMUNITY)
Admission: EM | Admit: 2015-09-28 | Discharge: 2015-09-28 | Disposition: A | Discharge status: Home / Self Care | Payer: 59 | Attending: Emergency Medicine | Admitting: Emergency Medicine

## 2015-09-28 ENCOUNTER — Ambulatory Visit: Payer: 59 | Admitting: Occupational Therapy

## 2015-09-28 ENCOUNTER — Ambulatory Visit: Payer: 59 | Admitting: Physical Therapy

## 2015-09-28 ENCOUNTER — Telehealth: Payer: Self-pay

## 2015-09-28 VITALS — BP 170/115 | HR 87

## 2015-09-28 DIAGNOSIS — I10 Essential (primary) hypertension: Secondary | ICD-10-CM | POA: Diagnosis not present

## 2015-09-28 DIAGNOSIS — Z8679 Personal history of other diseases of the circulatory system: Secondary | ICD-10-CM | POA: Diagnosis not present

## 2015-09-28 DIAGNOSIS — Z79899 Other long term (current) drug therapy: Secondary | ICD-10-CM | POA: Insufficient documentation

## 2015-09-28 DIAGNOSIS — Z8673 Personal history of transient ischemic attack (TIA), and cerebral infarction without residual deficits: Secondary | ICD-10-CM | POA: Diagnosis not present

## 2015-09-28 DIAGNOSIS — Z7901 Long term (current) use of anticoagulants: Secondary | ICD-10-CM | POA: Diagnosis not present

## 2015-09-28 HISTORY — DX: Cerebral infarction, unspecified: I63.9

## 2015-09-28 HISTORY — DX: Acute embolism and thrombosis of unspecified deep veins of unspecified lower extremity: I82.409

## 2015-09-28 LAB — COMPREHENSIVE METABOLIC PANEL
ALT: 31 U/L (ref 17–63)
AST: 26 U/L (ref 15–41)
Albumin: 3.6 g/dL (ref 3.5–5.0)
Alkaline Phosphatase: 46 U/L (ref 38–126)
Anion gap: 7 (ref 5–15)
BUN: <5 mg/dL — ABNORMAL LOW (ref 6–20)
CO2: 27 mmol/L (ref 22–32)
Calcium: 9.4 mg/dL (ref 8.9–10.3)
Chloride: 103 mmol/L (ref 101–111)
Creatinine, Ser: 0.87 mg/dL (ref 0.61–1.24)
GFR calc Af Amer: >60 mL/min (ref >60)
GFR calc non Af Amer: >60 mL/min (ref >60)
Glucose, Bld: 86 mg/dL (ref 65–99)
Potassium: 3.3 mmol/L — ABNORMAL LOW (ref 3.5–5.1)
Sodium: 137 mmol/L (ref 135–145)
Total Bilirubin: 0.7 mg/dL (ref 0.3–1.2)
Total Protein: 6.6 g/dL (ref 6.5–8.1)

## 2015-09-28 LAB — CBC WITH DIFFERENTIAL/PLATELET
Basophils Absolute: 0 10*3/uL (ref 0.0–0.1)
Basophils Relative: 0 %
Eosinophils Absolute: 0.1 10*3/uL (ref 0.0–0.7)
Eosinophils Relative: 1 %
HCT: 34.5 % — ABNORMAL LOW (ref 39.0–52.0)
Hemoglobin: 11.4 g/dL — ABNORMAL LOW (ref 13.0–17.0)
Lymphocytes Relative: 36 %
Lymphs Abs: 1.9 10*3/uL (ref 0.7–4.0)
MCH: 31.5 pg (ref 26.0–34.0)
MCHC: 33 g/dL (ref 30.0–36.0)
MCV: 95.3 fL (ref 78.0–100.0)
Monocytes Absolute: 0.4 10*3/uL (ref 0.1–1.0)
Monocytes Relative: 7 %
Neutro Abs: 3 10*3/uL (ref 1.7–7.7)
Neutrophils Relative %: 56 %
Platelets: 279 10*3/uL (ref 150–400)
RBC: 3.62 MIL/uL — ABNORMAL LOW (ref 4.22–5.81)
RDW: 13.2 % (ref 11.5–15.5)
WBC: 5.4 10*3/uL (ref 4.0–10.5)

## 2015-09-28 NOTE — Discharge Instructions (Signed)
Continue your lisinopril as before.  Follow-up with your primary Dr. tomorrow as scheduled for a recheck of your blood pressure.  Return to the emergency department if you experience any new or concerning symptoms.   Hypertension Hypertension, commonly called high blood pressure, is when the force of blood pumping through your arteries is too strong. Your arteries are the blood vessels that carry blood from your heart throughout your body. A blood pressure reading consists of a higher number over a lower number, such as 110/72. The higher number (systolic) is the pressure inside your arteries when your heart pumps. The lower number (diastolic) is the pressure inside your arteries when your heart relaxes. Ideally you want your blood pressure below 120/80. Hypertension forces your heart to work harder to pump blood. Your arteries may become narrow or stiff. Having untreated or uncontrolled hypertension can cause heart attack, stroke, kidney disease, and other problems. RISK FACTORS Some risk factors for high blood pressure are controllable. Others are not.  Risk factors you cannot control include:   Race. You may be at higher risk if you are African American.  Age. Risk increases with age.  Gender. Men are at higher risk than women before age 89 years. After age 85, women are at higher risk than men. Risk factors you can control include:  Not getting enough exercise or physical activity.  Being overweight.  Getting too much fat, sugar, calories, or salt in your diet.  Drinking too much alcohol. SIGNS AND SYMPTOMS Hypertension does not usually cause signs or symptoms. Extremely high blood pressure (hypertensive crisis) may cause headache, anxiety, shortness of breath, and nosebleed. DIAGNOSIS To check if you have hypertension, your health care provider will measure your blood pressure while you are seated, with your arm held at the level of your heart. It should be measured at least twice  using the same arm. Certain conditions can cause a difference in blood pressure between your right and left arms. A blood pressure reading that is higher than normal on one occasion does not mean that you need treatment. If it is not clear whether you have high blood pressure, you may be asked to return on a different day to have your blood pressure checked again. Or, you may be asked to monitor your blood pressure at home for 1 or more weeks. TREATMENT Treating high blood pressure includes making lifestyle changes and possibly taking medicine. Living a healthy lifestyle can help lower high blood pressure. You may need to change some of your habits. Lifestyle changes may include:  Following the DASH diet. This diet is high in fruits, vegetables, and whole grains. It is low in salt, red meat, and added sugars.  Keep your sodium intake below 2,300 mg per day.  Getting at least 30-45 minutes of aerobic exercise at least 4 times per week.  Losing weight if necessary.  Not smoking.  Limiting alcoholic beverages.  Learning ways to reduce stress. Your health care provider may prescribe medicine if lifestyle changes are not enough to get your blood pressure under control, and if one of the following is true:  You are 59-38 years of age and your systolic blood pressure is above 140.  You are 109 years of age or older, and your systolic blood pressure is above 150.  Your diastolic blood pressure is above 90.  You have diabetes, and your systolic blood pressure is over 140 or your diastolic blood pressure is over 90.  You have kidney disease and your  blood pressure is above 140/90.  You have heart disease and your blood pressure is above 140/90. Your personal target blood pressure may vary depending on your medical conditions, your age, and other factors. HOME CARE INSTRUCTIONS  Have your blood pressure rechecked as directed by your health care provider.   Take medicines only as directed by  your health care provider. Follow the directions carefully. Blood pressure medicines must be taken as prescribed. The medicine does not work as well when you skip doses. Skipping doses also puts you at risk for problems.  Do not smoke.   Monitor your blood pressure at home as directed by your health care provider. SEEK MEDICAL CARE IF:   You think you are having a reaction to medicines taken.  You have recurrent headaches or feel dizzy.  You have swelling in your ankles.  You have trouble with your vision. SEEK IMMEDIATE MEDICAL CARE IF:  You develop a severe headache or confusion.  You have unusual weakness, numbness, or feel faint.  You have severe chest or abdominal pain.  You vomit repeatedly.  You have trouble breathing. MAKE SURE YOU:   Understand these instructions.  Will watch your condition.  Will get help right away if you are not doing well or get worse.   This information is not intended to replace advice given to you by your health care provider. Make sure you discuss any questions you have with your health care provider.   Document Released: 03/04/2005 Document Revised: 07/19/2014 Document Reviewed: 12/25/2012 Elsevier Interactive Patient Education [employer]2016 [employer]

## 2015-09-28 NOTE — ED Notes (Signed)
To ED -- sent from physical therapy-- due to blood pressure being too high-- states was 170/116- and 161/116. Pt has hx of CVA on May 31st caused from HTN

## 2015-09-28 NOTE — ED Provider Notes (Signed)
CSN: [id number]     Arrival date & time 09/28/15  1436 History   First MD Initiated Contact with Patient 09/28/15 1522     Chief Complaint  Patient presents with  . Hypertension     (Consider location/radiation/quality/duration/timing/severity/associated sxs/prior Treatment) HPI Comments: Patient is a 30 year old male with history of hypertension and recent admission for hypertensive intracranial hemorrhage. He was in the hospital and rehabilitation for several weeks, then went home approximately 5 days ago. He went to outpatient therapy today where they noted his blood pressure was elevated at 160/115. Therapy was unable to perform his routine due to this abnormal vital signs. The patient denies to me he is experiencing any symptoms. He denies any headache, tightness in his chest, shortness of breath, or other complaint.  Patient is a 30 y.o. male presenting with hypertension. The history is provided by the patient.  Hypertension The problem occurs constantly. The problem has been gradually improving. Pertinent negatives include no chest pain, no headaches and no shortness of breath. Nothing aggravates the symptoms. Nothing relieves the symptoms. He has tried nothing for the symptoms. The treatment provided no relief.    Past Medical History  Diagnosis Date  . Hypertension   . Fracture of left ankle     treated with cast  . CVA (cerebral vascular accident) (HCC)   . DVT (deep venous thrombosis) [facility](HCC)    Past Surgical History  Procedure Laterality Date  . Greenfield filter     Family History  Problem Relation Age of Onset  . Hypertension Mother    Social History  Substance Use Topics  . Smoking status: Never Smoker   . Smokeless tobacco: Never Used  . Alcohol Use: No    Review of Systems  Respiratory: Negative for shortness of breath.   Cardiovascular: Negative for chest pain.  Neurological: Negative for headaches.  All other systems reviewed and are  negative.     Allergies  Bee venom; Coconut oil; and Ancef  Home Medications   Prior to Admission medications   Medication Sig Start Date End Date Taking? Authorizing Provider  docusate (COLACE) 50 MG/5ML liquid Take 10 mLs (100 mg total) by mouth 2 (two) times daily as needed for mild constipation. Patient not taking: Reported on 09/28/2015 09/21/15   Evlyn KannerPamela S Love, PA-C  enoxaparin (LOVENOX) 60 MG/0.6ML injection Inject 0.6 mLs (60 mg total) into the skin daily. 09/21/15   Jacquelynn CreePamela S Love, PA-C  famotidine (PEPCID) 40 MG tablet Take 1 tablet (40 mg total) by mouth daily. Patient not taking: Reported on 09/28/2015 09/21/15   Evlyn KannerPamela S Love, PA-C  FLUoxetine (PROZAC) 20 MG capsule Take 1 capsule (20 mg total) by mouth daily. 09/21/15   Evlyn KannerPamela S Love, PA-C  lisinopril (PRINIVIL,ZESTRIL) 20 MG tablet Take 1 tablet (20 mg total) by mouth 2 (two) times daily. 09/21/15   Jacquelynn CreePamela S Love, PA-C  Multiple Vitamin (MULTIVITAMIN WITH MINERALS) TABS tablet Take 1 tablet by mouth daily. 09/21/15   Evlyn KannerPamela S Love, PA-C  saccharomyces boulardii (FLORASTOR) 250 MG capsule Take 1 capsule (250 mg total) by mouth 2 (two) times daily. Patient not taking: Reported on 09/28/2015 09/21/15   Evlyn KannerPamela S Love, PA-C   BP 152/94 mmHg  Pulse 93  Temp(Src) 98.7 F (37.1 C) (Oral)  Resp 16  Ht 5\' 8"  (1.727 m)  Wt 258 lb (117.028 kg)  BMI 39.24 kg/m2  SpO2 98% Physical Exam  Constitutional: He is oriented to person, place, and time. He appears well-developed and well-nourished.  No distress.  HENT:  Head: Normocephalic and atraumatic.  Mouth/Throat: Oropharynx is clear and moist.  Eyes: EOM are normal. Pupils are equal, round, and reactive to light.  Neck: Normal range of motion. Neck supple.  Cardiovascular: Normal rate and regular rhythm.  Exam reveals no friction rub.   No murmur heard. Pulmonary/Chest: Effort normal and breath sounds normal. No respiratory distress. He has no wheezes. He has no rales.  Abdominal: Soft. Bowel  sounds are normal. He exhibits no distension. There is no tenderness.  Musculoskeletal: Normal range of motion. He exhibits no edema.  Neurological: He is alert and oriented to person, place, and time. No cranial nerve deficit. He exhibits normal muscle tone. Coordination normal.  Skin: Skin is warm and dry. He is not diaphoretic.  Nursing note and vitals reviewed.   ED Course  Procedures (including critical care time) Labs Review Labs Reviewed  CBC WITH DIFFERENTIAL/PLATELET - Abnormal; Notable for the following:    RBC 3.62 (*)    Hemoglobin 11.4 (*)    HCT 34.5 (*)    All other components within normal limits  COMPREHENSIVE METABOLIC PANEL - Abnormal; Notable for the following:    Potassium 3.3 (*)    BUN <5 (*)    All other components within normal limits    Imaging Review No results found. I have personally reviewed and evaluated these images and lab results as part of my medical decision-making.   EKG Interpretation None      MDM   Final diagnoses:  None    Patient sent for evaluation of elevated blood pressure. His neurologic exam is nonfocal and he has no complaints. His blood pressure is now in the 140s over 90s. His laboratory studies and neuro exam showed no sign of end organ damage. I do not feel as though any further workup is indicated. The patient does have an appointment tomorrow with his primary Dr. He can have his blood pressure rechecked tomorrow and determine whether additional medications or an increase of his lisinopril are indicated.    Geoffery Lyons, MD 09/28/15 [phone]

## 2015-09-28 NOTE — Telephone Encounter (Signed)
A[facility]-called to let you know prior to evaluation, the pt's blood pressure was ranging 160/115-170/115. She states that the pt did not have any symptoms of feeling unwell. She states that they will call the pt's PCP to see what they need to do about his blood pressure. FYI

## 2015-09-29 NOTE — Therapy (Signed)
[facility] [address] [place], [place], [address] Phone: [phone]   Fax:  [phone]  Patient Details  Name: Brad Mcgee MRN: [id number] Date of Birth: 09/20/1985 Referring Provider:  Sula RumpleVirk, Charanjit, MD  Encounter Date: 09/28/2015  PT evaluation initiated, but not fully completed today due to pt's blood pressure 161.116, then 170/115.  MD office recommends patient proceed to ED.  Evaluation not completed.   Kalen Neidert W. 09/29/2015, 7:51 AM Gean MaidensMARRIOTT,Dainel Arcidiacono W., PT [place] [facility] [address] [place], [place], [address] Phone: [phone]   Fax:  [phone]

## 2015-10-05 ENCOUNTER — Encounter: Payer: 59 | Attending: Physical Medicine & Rehabilitation | Admitting: Physical Medicine & Rehabilitation

## 2015-10-05 ENCOUNTER — Encounter: Payer: Self-pay | Admitting: Physical Medicine & Rehabilitation

## 2015-10-05 VITALS — BP 146/97 | HR 97

## 2015-10-05 DIAGNOSIS — I82452 Acute embolism and thrombosis of left peroneal vein: Secondary | ICD-10-CM

## 2015-10-05 DIAGNOSIS — Z86718 Personal history of other venous thrombosis and embolism: Secondary | ICD-10-CM | POA: Diagnosis not present

## 2015-10-05 DIAGNOSIS — G4733 Obstructive sleep apnea (adult) (pediatric): Secondary | ICD-10-CM | POA: Diagnosis not present

## 2015-10-05 DIAGNOSIS — D696 Thrombocytopenia, unspecified: Secondary | ICD-10-CM | POA: Diagnosis not present

## 2015-10-05 DIAGNOSIS — Z5189 Encounter for other specified aftercare: Secondary | ICD-10-CM | POA: Insufficient documentation

## 2015-10-05 DIAGNOSIS — N179 Acute kidney failure, unspecified: Secondary | ICD-10-CM | POA: Insufficient documentation

## 2015-10-05 DIAGNOSIS — I10 Essential (primary) hypertension: Secondary | ICD-10-CM | POA: Insufficient documentation

## 2015-10-05 DIAGNOSIS — I82492 Acute embolism and thrombosis of other specified deep vein of left lower extremity: Secondary | ICD-10-CM

## 2015-10-05 DIAGNOSIS — I610 Nontraumatic intracerebral hemorrhage in hemisphere, subcortical: Secondary | ICD-10-CM | POA: Diagnosis not present

## 2015-10-05 DIAGNOSIS — I629 Nontraumatic intracranial hemorrhage, unspecified: Secondary | ICD-10-CM | POA: Insufficient documentation

## 2015-10-05 MED ORDER — FLUOXETINE HCL 10 MG PO CAPS
10.0000 mg | ORAL_CAPSULE | Freq: Every day | ORAL | Status: DC
Start: 2015-10-05 — End: 2015-11-09

## 2015-10-05 NOTE — Progress Notes (Signed)
Subjective:    Patient ID: Brad Mcgee, male    DOB: 05-05-1985, 30 y.o.   MRN: [id number]  HPI  30 y.o. male with history of hypertension presents for transitional care management after being discharged from CIR after right basal ganglia hemorrhage. Admit date: 09/01/2015 Discharge date: 09/23/2015  At that time he was encouraged to eat a heart healthy diet, which he has been compliant with for the most part. He is not driving.  He did not get to start therapy because of an elevation in BP, for which he had to go to the ED.  Evaluation was relatively unremarkable (except for HTN) and he was discharged to his PCP, who started him on Norvasc.     He has an appointment with Neurology 11/13/15.  He has sleep study scheduled for 8/1/7.  He continues to take his medications  DME: Shower chair Mobility: Quad cane Therapies: To restart  Pain Inventory Average Pain 0 Pain Right Now 0 My pain is no pain  In the last 24 hours, has pain interfered with the following? General activity 0 Relation with others 0 Enjoyment of life 0 What TIME of day is your pain at its worst? no pain Sleep (in general) Good  Pain is worse with: no pain Pain improves with: no pain Relief from Meds: no pain  Mobility use a cane how many minutes can you walk? ? ability to climb steps?  yes do you drive?  yes transfers alone Do you have any goals in this area?  yes  Function employed # of hrs/week 40 Do you have any goals in this area?  yes  Neuro/Psych trouble walking  Prior Studies hospital f/u  Physicians involved in your care hospital f/u   Family History  Problem Relation Age of Onset  . Hypertension Mother    Social History   Social History  . Marital Status: Single    Spouse Name: N/A  . Number of Children: N/A  . Years of Education: N/A   Social History Main Topics  . Smoking status: Never Smoker   . Smokeless tobacco: Never Used  . Alcohol Use: No  . Drug Use: No  .  Sexual Activity: Not Asked   Other Topics Concern  . None   Social History Narrative   Past Surgical History  Procedure Laterality Date  . Greenfield filter     Past Medical History  Diagnosis Date  . Hypertension   . Fracture of left ankle     treated with cast  . CVA (cerebral vascular accident) (HCC)   . DVT (deep venous thrombosis) (HCC)    BP 146/97 mmHg  Pulse 97  SpO2 98%  Opioid Risk Score:   Fall Risk Score:  `1  Depression screen PHQ 2/9  Depression screen PHQ 2/9 10/05/2015  Decreased Interest 0  Down, Depressed, Hopeless 0  PHQ - 2 Score 0  Altered sleeping 0  Tired, decreased energy 0  Change in appetite 0  Feeling bad or failure about yourself  0  Trouble concentrating 0  Moving slowly or fidgety/restless 0  Suicidal thoughts 0  PHQ-9 Score 0   Review of Systems  Constitutional: Negative.   HENT: Negative.   Eyes: Negative.   Respiratory: Negative.   Cardiovascular: Negative.   Gastrointestinal: Negative.   Genitourinary: Negative.   Musculoskeletal: Positive for gait problem.  Skin: Positive for rash.  Neurological: Negative.   Hematological: Negative.   Psychiatric/Behavioral: Negative.   All other systems reviewed  and are negative.     Objective:   Physical Exam Constitutional: He appears well-developed and well-nourished. No distress. Obese HENT:  Normocephalic and atraumatic.   Eyes: Conjunctivae and EOM are normal.    Neck: Stoma healed.    Cardiovascular: Regular rate and rhythm.     Respiratory: Effort normal. No accessory muscle usage or stridor. No respiratory distress.    GI: Soft. Bowel sounds are normal. He exhibits no distension. There is no tenderness.  Musculoskeletal: He exhibits no edema or tenderness.  Gait: WNL  Neurological: He is alert and oriented.   Motor: RUE/RLE: 5/5 proximal to distal LUE: 4+/5 shoulder abduction, 4+/5 elbow flexion/extension, 4+/5 finger grip (improving) LLE: 5/5 proximal to distal, mild  apraxia (improving). No increased tone noted.   Speech continues to improve Skin: Skin is warm and dry. He is not diaphoretic.  Psychiatric: His mood and behavior appear normal.     Assessment & Plan:  30 y.o. male with history of hypertension presents for transitional care management after being discharged from CIR after right basal ganglia hemorrhage.  1.  Right basal ganglia hemorrhage  Will decrease Fluoxetine to 10mg  for 1 week, then d/c  Follow up with Neurology in Aug.  Cont therapies  2.  Left peroneal DVT  IVC filter placed on 06/16    Cont Lovenox 60mg  daily  Follow up with PCP regarding duration  3. Pain Management  Changed Naproxen changed to PRN  4. HTN  Cont meds  Being titrated by PCP, continue titration  5. AKI  Resolved  6. Thrombocytopenia:  Resolved.   7. OSA  Sleep study in 2 weeks.  8. Morbid obesity  Cont weight loss  Meds reviewed Referral reviewed All questions answered

## 2015-10-16 ENCOUNTER — Ambulatory Visit: Payer: 59 | Attending: Physical Medicine & Rehabilitation | Admitting: Physical Therapy

## 2015-10-16 ENCOUNTER — Encounter: Payer: Self-pay | Admitting: Occupational Therapy

## 2015-10-16 ENCOUNTER — Encounter: Payer: Self-pay | Admitting: Physical Therapy

## 2015-10-16 ENCOUNTER — Ambulatory Visit: Payer: 59 | Admitting: Occupational Therapy

## 2015-10-16 ENCOUNTER — Telehealth: Payer: Self-pay

## 2015-10-16 VITALS — BP 165/108 | HR 86

## 2015-10-16 VITALS — BP 159/104 | HR 92

## 2015-10-16 DIAGNOSIS — G8191 Hemiplegia, unspecified affecting right dominant side: Secondary | ICD-10-CM

## 2015-10-16 DIAGNOSIS — R2689 Other abnormalities of gait and mobility: Secondary | ICD-10-CM | POA: Insufficient documentation

## 2015-10-16 DIAGNOSIS — M6281 Muscle weakness (generalized): Secondary | ICD-10-CM

## 2015-10-16 DIAGNOSIS — R278 Other lack of coordination: Secondary | ICD-10-CM | POA: Insufficient documentation

## 2015-10-16 DIAGNOSIS — R2681 Unsteadiness on feet: Secondary | ICD-10-CM | POA: Insufficient documentation

## 2015-10-16 NOTE — Therapy (Signed)
[facility] [address] [place], [place], [address] Phone: [phone]   Fax:  [phone]  Occupational Therapy Evaluation  Patient Details  Name: Brad Mcgee MRN: [id number] Date of Birth: [birth year] Referring Provider: Dr. Marcello Fennel  Encounter Date: 10/16/2015      OT End of Session - 10/16/15 1628    Visit Number 1   Number of Visits 8   Date for OT Re-Evaluation 11/14/15   Authorization Type [employer]   Authorization Time Period 60 visit limit for OT   OT Start Time 1015   OT Stop Time 1055   OT Time Calculation (min) 40 min   Activity Tolerance Patient tolerated treatment well      Past Medical History:  Diagnosis Date  . CVA (cerebral vascular accident) (HCC)   . DVT (deep venous thrombosis) (HCC)   . Fracture of left ankle    treated with cast  . Hypertension     Past Surgical History:  Procedure Laterality Date  . greenfield filter      Vitals:   10/16/15 1056  BP: (!) 165/108  Pulse: 86        Subjective Assessment - 10/16/15 1021    Patient is accompained by: Family member  mother   Pertinent History see epic snapshot, R BG CVA , HTN.Marland Kitchen CHECK BP!!!   Patient Stated Goals Strengthen my leg and work on my left arm   Currently in Pain? No/denies           [facility] OT Assessment - 10/16/15 1023      Assessment   Diagnosis R BG ICH   Referring Provider Dr. Marcello Fennel   Onset Date 08/15/15   Prior Therapy Inpt ST, PT and OT     Precautions   Precautions Fall   Precaution Comments No heavy lifting, strenous activity, no driving     Balance Screen   Has the patient fallen in the past 6 months No     Home  Environment   Family/patient expects to be discharged to: Private residence   Living Arrangements Parent  adult sister   Type of Home House   Home Layout One level   Bathroom Shower/Tub Walk-in Human resources officer Standard   Additional Comments Pt has shower  seat with a back in the shower.      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, fishing and working on the treadmill     ADL   Eating/Feeding Modified independent   Grooming Independent   Upper Body Bathing Modified independent   Lower Body Bathing Modified independent   Upper Body Dressing Increased time   Lower Body Dressing Increased time   Glass blower/designer - Conservator, museum/gallery -  Tour manager Independent     IADL   Shopping Assistance for transportation   Light Housekeeping Performs light daily tasks such as dishwashing, bed making   Meal Prep Able to complete simple warm meal prep   Community Mobility Relies on family or friends for transportation   Medication Management Has difficulty remembering to take medication  mother organizes meds      Mobility   Mobility Status Needs assist  supervision in community     Written Expression   Dominant Hand Right   Handwriting 100% legible  Vision - History   Baseline Vision Wears glasses for distance only   Additional Comments Pt denies any visual changes  intially had diplopia but resolved     Vision Assessment   Eye Alignment Within Functional Limits   Ocular Range of Motion Within Functional Limits   Saccades Within functional limits     Activity Tolerance   Activity Tolerance Endurance does not limit participation in activity     Cognition   Overall Cognitive Status Within Functional Limits for tasks assessed     Sensation   Light Touch Appears Intact   Hot/Cold Appears Intact   Proprioception Appears Intact     Coordination   Gross Motor Movements are Fluid and Coordinated Yes   Fine Motor Movements are Fluid and Coordinated No   Finger Nose Finger Test mildly impaired   9 Hole Peg Test Left;Right   Right 9 Hole Peg Test 24.55    Left 9 Hole Peg Test 1.02.05     Tone   Assessment Location Left Upper Extremity     ROM / Strength   AROM / PROM / Strength AROM;Strength     AROM   Overall AROM  Within functional limits for tasks performed   Overall AROM Comments BUE's     Strength   Overall Strength Deficits   Overall Strength Comments 4+/5 in L shoulder flexion/extension, L drift with eyes closed     Hand Function   Right Hand Gross Grasp Functional   Right Hand Grip (lbs) 45   Left Hand Gross Grasp Impaired   Left Hand Grip (lbs) 37     LUE Tone   LUE Tone Within Functional Limits       Pt with very high BP today even after 35 minute rest after PT session.  Pt and family instructed to contact MD immediately regarding BP and advised of stroke risk. Pt's father stated they would contact MD as soon as they left clinic.                       OT Long Term Goals - 10/16/15 1620      OT LONG TERM GOAL #1   Title Pt will be mod I with HEP - 11/13/2015   Status New     OT LONG TERM GOAL #2   Title Pt will demonstrate improved coordination as evidenced by decreasing time on 9 hole peg test by at least 15 seconds (baseline= 1.02.59)   Status New     OT LONG TERM GOAL #3   Title Pt demonstrate 5/5 proximal shoulder strenghth in LUE to assist with functional tasks   Status New               Plan - 10/16/15 1623    Clinical Impression Statement Pt is 30 year old male s/p R BG ICH on 08/15/2015.  Pt was hospitalized until 09/23/2015 including inpt rehab.  PMH: morbid obesity, HTN, DVT LLE with IVC filter, Pt still with uncontrolled BP.  Pt presents today with the following deficts that impact IADL's:  decreased LUE proximal strength, decreased coordination L hand, decreased functional use of R dominant UE.  Pt will benefit from short course of skilled OT to address these deficits and maximize functional use of RUE.   Rehab Potential Good   Clinical Impairments Affecting Rehab Potential  uncontrolled BP - pt and pt's father  instructed to follow up today for high BP   OT Frequency 2x /  week   OT Duration 4 weeks   OT Treatment/Interventions Self-care/ADL training;Neuromuscular education;Therapeutic exercise;Manual Therapy;Therapeutic activities;Patient/family education   Plan issue fine motor coordination HEP   Consulted and Agree with Plan of Care Patient;Family member/caregiver   Family Member Consulted mother and father      Patient will benefit from skilled therapeutic intervention in order to improve the following deficits and impairments:  Cardiopulmonary status limiting activity, Decreased coordination, Decreased strength, Impaired UE functional use, Obesity  Visit Diagnosis: Hemiplegia, unspecified affecting right dominant side (HCC) - Plan: Ot plan of care cert/re-cert  Muscle weakness (generalized) - Plan: Ot plan of care cert/re-cert  Other lack of coordination - Plan: Ot plan of care cert/re-cert    Problem List Patient Active Problem List   Diagnosis Date Noted  . DVT prophylaxis   . Thrombocytopenia (HCC)   . Left hemiparesis (HCC)   . AKI (acute kidney injury) (HCC)   . Hyponatremia   . Dysphagia, post-stroke   . OSA (obstructive sleep apnea) suspected will need cpap when decannulated 09/05/2015  . Obesity hypoventilation syndrome (HCC)   . Tracheostomy care (HCC)   . Acute blood loss anemia   . Deep venous thrombosis (DVT) of left peroneal vein (HCC) 09/01/2015  . Dysphagia following nontraumatic intracerebral hemorrhage 09/01/2015  . Morbid obesity (HCC) 09/01/2015  . Nontraumatic acute hemorrhage of basal ganglia (HCC) 09/01/2015  . Tracheostomy in place [facility])   . Leukocytosis   . Fever, unspecified   . Right Basal ganglia hemorrhage (HCC)   . Acute respiratory failure (HCC)   . Acute respiratory failure with hypoxemia (HCC)   . Cytotoxic brain edema (HCC) 08/18/2015  . Brain herniation (HCC) 08/18/2015  . Accelerated hypertension  08/16/2015    Norton Pastel, OTR/L 10/16/2015, 4:34 PM  [facility] [address] [place], [place], [address] Phone: [phone]   Fax:  [phone]  Name: CHRISTPHOR LASHUA MRN: [id number] Date of Birth: 1985/05/21

## 2015-10-16 NOTE — Telephone Encounter (Signed)
Pt has seen PCP. I advised [name] to contact PCP in regards to refilling Lovenox.

## 2015-10-16 NOTE — Telephone Encounter (Signed)
Pt is requesting a refill on Lovenox. Are we refilling, or is PCP? Please advise.

## 2015-10-16 NOTE — Telephone Encounter (Signed)
Pt was encouraged to follow up with his PCP regarding follow up for his DVT.  If he has not seen his PCP, we can refill the medication until he has a scheduled appointment.  Thanks

## 2015-10-17 ENCOUNTER — Ambulatory Visit (HOSPITAL_BASED_OUTPATIENT_CLINIC_OR_DEPARTMENT_OTHER): Payer: 59 | Attending: Physical Medicine and Rehabilitation | Admitting: Internal Medicine

## 2015-10-17 VITALS — Ht 68.0 in | Wt 259.0 lb

## 2015-10-17 DIAGNOSIS — G4733 Obstructive sleep apnea (adult) (pediatric): Secondary | ICD-10-CM

## 2015-10-17 DIAGNOSIS — R0683 Snoring: Secondary | ICD-10-CM | POA: Diagnosis not present

## 2015-10-17 NOTE — Therapy (Signed)
[facility] [address] [place], [place], [address] Phone: [phone]   Fax:  [phone]  Physical Therapy Evaluation  Patient Details  Name: Brad Mcgee MRN: [id number] Date of Birth: [birth year] Referring Provider: Dr. Maryla Morrow  Encounter Date: 10/16/2015      PT End of Session - 10/17/15 1514    Visit Number 1   Number of Visits 9   Date for PT Re-Evaluation 11/16/15   Authorization Type UHC   Authorization - Visit Number 1   Authorization - Number of Visits 60   PT Start Time 0935   PT Stop Time 1015   PT Time Calculation (min) 40 min      Past Medical History:  Diagnosis Date  . CVA (cerebral vascular accident) (HCC)   . DVT (deep venous thrombosis) (HCC)   . Fracture of left ankle    treated with cast  . Hypertension     Past Surgical History:  Procedure Laterality Date  . greenfield filter      Vitals:   10/16/15 0942 10/16/15 1016  BP: (!) 140/100 (!) 159/104  Pulse: 92          Subjective Assessment - 10/17/15 1456    Subjective Pt is a 30 year old male s/p CVA on 08-15-15 affecting LUE and LLE; pt using SBQC for assistance with amb. ; states he is starting to amb. some without quad cane in the home   Patient is accompained by: Family member  Mother   Patient Stated Goals To walk without cane and improve balance   Currently in Pain? No/denies            [facility] PT Assessment - 10/17/15 0001      Assessment   Medical Diagnosis R basal ganglia CVA   Referring Provider Dr. Maryla Morrow   Onset Date/Surgical Date 08/15/15     Precautions   Precautions Fall   Precaution Comments No heavy lifting, strenous activity, no driving     Balance Screen   Has the patient fallen in the past 6 months No   Has the patient had a decrease in activity level because of a fear of falling?  No   Is the patient reluctant to leave their home because of a fear of falling?  No     Home  Nurse, mental health Private residence   Living Arrangements Parent   Available Help at Discharge Family   Type of Home House   Home Access Stairs to enter   Entrance Stairs-Number of Steps 3   Entrance Stairs-Rails None   Home Layout One level   Home Equipment [place] - quad;Shower seat   Additional Comments Pt's home has 2 steps to enter with handrail; one level home.     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, fishing and working on the treadmill     Naval architect Deficits   Overall Strength Comments 4+/5 in L shoulder flexion/extension, L drift with eyes closed   Left Knee Flexion 5/5   Left Knee Extension 5/5   Right Ankle Dorsiflexion 5/5   Right Ankle Plantar Flexion 5/5   Left Ankle Dorsiflexion 4+/5   Left Ankle Plantar Flexion 4/5     Ambulation/Gait   Ambulation/Gait Yes   Ambulation/Gait Assistance 5: Supervision   Ambulation Distance (Feet) 125 Feet  Assistive device Small based quad cane   Gait Pattern Step-through pattern   Ambulation Surface Level;Indoor   Gait velocity 2.18  15.03   Stairs Yes   Stairs Assistance 5: Supervision   Stair Management Technique Two rails   Number of Stairs 4   Height of Stairs 6     Standardized Balance Assessment   Standardized Balance Assessment Timed Up and Go Test     Berg Balance Test   Sit to Stand Able to stand without using hands and stabilize independently   Standing Unsupported Able to stand safely 2 minutes   Sitting with Back Unsupported but Feet Supported on Floor or Stool Able to sit safely and securely 2 minutes   Stand to Sit Sits safely with minimal use of hands   Transfers Able to transfer safely, minor use of hands   Standing Unsupported with Eyes Closed Able to stand 10 seconds safely   Standing Ubsupported with Feet Together Able to place feet together independently and stand 1  minute safely   From Standing, Reach Forward with Outstretched Arm Can reach confidently >25 cm (10")   From Standing Position, Pick up Object from Floor Able to pick up shoe safely and easily   From Standing Position, Turn to Look Behind Over each Shoulder Looks behind from both sides and weight shifts well   Turn 360 Degrees Able to turn 360 degrees safely but slowly  To R = 4.84; to L= 4.9 with mild c/o dizziness   Standing Unsupported, Alternately Place Feet on Step/Stool Able to stand independently and safely and complete 8 steps in 20 seconds   Standing Unsupported, One Foot in Front Able to plae foot ahead of the other independently and hold 30 seconds   Standing on One Leg Tries to lift leg/unable to hold 3 seconds but remains standing independently   Total Score 50     Timed Up and Go Test   Normal TUG (seconds) 14.53  no device                                PT Long Term Goals - 10/17/15 1640      PT LONG TERM GOAL #1   Title Improve gait velocity to >/= 2.8 ft/sec without cane for incr. gait efficiency.  (11-16-15)   Baseline 2.18 ft/sec with no device   Time 4   Period Weeks   Status New     PT LONG TERM GOAL #2   Title Improve TUG score to </= 11.0 secs without device for incr. safety with mobility.  (11-16-15)   Baseline 14.53 secs without device   Time 4   Period Weeks   Status New     PT LONG TERM GOAL #3   Title Incr. Berg score to >/= 53/56 for incr. safety with leisure activities and for decr. fall risk. (11-16-15)   Baseline 50/56   Time 4   Period Weeks   Status New     PT LONG TERM GOAL #4   Title Complete DGI and establish goal as appropriate.  (11-16-15)   Time 4   Period Weeks   Status New     PT LONG TERM GOAL #5   Title Amb. 1000' on all surfaces without device modified independently. (11-16-15)   Time 4   Period Weeks   Status New     Additional Long Term Goals   Additional Long Term Goals  Yes     PT LONG TERM GOAL  #6   Title Independent in HEP for balance and strengthening.  (11-16-15)   Time 4   Period Weeks   Status New               Plan - 10/17/15 1637    PT Next Visit Plan Do DGI; Begin HEP for LLE strengthening and high level balance activities - tandem and SLS for LLE; (walking program was recommended at eval but Mother reports pt is unable to be outside in heat - recommended walking at store such as Conservation officer, historic buildings)   PT Home Exercise Plan see above   Consulted and Agree with Plan of Care Patient;Family member/caregiver   Family Member Consulted mother      Patient will benefit from skilled therapeutic intervention in order to improve the following deficits and impairments:  Abnormal gait, Decreased balance, Decreased endurance, Decreased activity tolerance, Decreased coordination, Decreased strength  Visit Diagnosis: Unsteadiness on feet - Plan: PT plan of care cert/re-cert  Other abnormalities of gait and mobility - Plan: PT plan of care cert/re-cert  Muscle weakness (generalized) - Plan: PT plan of care cert/re-cert     Problem List Patient Active Problem List   Diagnosis Date Noted  . DVT prophylaxis   . Thrombocytopenia (HCC)   . Left hemiparesis (HCC)   . AKI (acute kidney injury) (HCC)   . Hyponatremia   . Dysphagia, post-stroke   . OSA (obstructive sleep apnea) suspected will need cpap when decannulated 09/05/2015  . Obesity hypoventilation syndrome (HCC)   . Tracheostomy care (HCC)   . Acute blood loss anemia   . Deep venous thrombosis (DVT) of left peroneal vein (HCC) 09/01/2015  . Dysphagia following nontraumatic intracerebral hemorrhage 09/01/2015  . Morbid obesity (HCC) 09/01/2015  . Nontraumatic acute hemorrhage of basal ganglia (HCC) 09/01/2015  . Tracheostomy in place [facility])   . Leukocytosis   . Fever, unspecified   . Right Basal ganglia hemorrhage (HCC)   . Acute respiratory failure (HCC)   . Acute respiratory failure with hypoxemia (HCC)   .  Cytotoxic brain edema (HCC) 08/18/2015  . Brain herniation (HCC) 08/18/2015  . Accelerated hypertension 08/16/2015    Kary Kos, PT 10/17/2015, 5:07 PM  [facility] [address] [place], [place], [address] Phone: [phone]   Fax:  [phone]  Name: Brad Mcgee MRN: [id number] Date of Birth: 09-28-1985

## 2015-10-20 ENCOUNTER — Telehealth: Payer: Self-pay | Admitting: Physical Medicine & Rehabilitation

## 2015-10-20 ENCOUNTER — Other Ambulatory Visit: Payer: Self-pay | Admitting: Physical Medicine & Rehabilitation

## 2015-10-20 MED ORDER — ENOXAPARIN SODIUM 60 MG/0.6ML ~~LOC~~ SOLN: 60.0000 mg | SUBCUTANEOUS | 0 refills | Status: DC

## 2015-10-20 NOTE — Telephone Encounter (Signed)
PA submitted to [facility] for quantity limit override for Enoxaparin 60 mg/0.64ml inj daily #30

## 2015-10-20 NOTE — Telephone Encounter (Signed)
John with [facility] is following up on a prior authorization that was sent over on enoxaparin.  Please call him or someone back at [phone] and give prescription number [id number] and store [phone].

## 2015-10-20 NOTE — Telephone Encounter (Signed)
Refilled month supply

## 2015-10-20 NOTE — Telephone Encounter (Addendum)
She said that the PCP said that is out of his "realm of knowledge".  She spoke to someone yesterday at hospital (?PA?) and was told we could refill.  She is trying to get him in with a [place] office at [facility] and perhaps they will take over future management. She will let us know if he gets an appt and who it will be. How much should we refill?

## 2015-10-20 NOTE — Telephone Encounter (Signed)
Brad Mcgee, patients mother called to let us know that he needs a refill on his Lovenox.  It can be called into his pharmacy [facility] in [place].  Any questions call her at [phone].

## 2015-10-23 ENCOUNTER — Encounter: Payer: Self-pay | Admitting: Occupational Therapy

## 2015-10-23 ENCOUNTER — Ambulatory Visit: Payer: 59 | Attending: Physical Medicine & Rehabilitation | Admitting: Physical Therapy

## 2015-10-23 ENCOUNTER — Telehealth: Payer: Self-pay | Admitting: Physical Medicine & Rehabilitation

## 2015-10-23 ENCOUNTER — Encounter: Payer: Self-pay | Admitting: Physical Medicine & Rehabilitation

## 2015-10-23 ENCOUNTER — Ambulatory Visit: Payer: 59 | Admitting: Occupational Therapy

## 2015-10-23 VITALS — BP 151/101 | HR 92

## 2015-10-23 VITALS — BP 124/92

## 2015-10-23 DIAGNOSIS — R2689 Other abnormalities of gait and mobility: Secondary | ICD-10-CM

## 2015-10-23 DIAGNOSIS — M6281 Muscle weakness (generalized): Secondary | ICD-10-CM

## 2015-10-23 DIAGNOSIS — G8191 Hemiplegia, unspecified affecting right dominant side: Secondary | ICD-10-CM

## 2015-10-23 DIAGNOSIS — R2681 Unsteadiness on feet: Secondary | ICD-10-CM

## 2015-10-23 DIAGNOSIS — I82452 Acute embolism and thrombosis of left peroneal vein: Secondary | ICD-10-CM

## 2015-10-23 DIAGNOSIS — R278 Other lack of coordination: Secondary | ICD-10-CM

## 2015-10-23 NOTE — Therapy (Signed)
[facility] [address] [place], [place], [address] Phone: [phone]   Fax:  [phone]  Physical Therapy Treatment  Patient Details  Name: Brad Mcgee MRN: [id number] Date of Birth: 1985/12/29 Referring Provider: Dr. Maryla Morrow  Encounter Date: 10/23/2015      PT End of Session - 10/23/15 1019    Visit Number 2   Number of Visits 9   Date for PT Re-Evaluation 11/16/15   Authorization Type UHC   Authorization - Visit Number 1   Authorization - Number of Visits 60   PT Start Time 0933   PT Stop Time 1020   PT Time Calculation (min) 47 min   Equipment Utilized During Treatment Gait belt   Activity Tolerance Patient tolerated treatment well   Behavior During Therapy WFL for tasks assessed/performed      Past Medical History:  Diagnosis Date  . CVA (cerebral vascular accident) (HCC)   . DVT (deep venous thrombosis) (HCC)   . Fracture of left ankle    treated with cast  . Hypertension     Past Surgical History:  Procedure Laterality Date  . greenfield filter      Vitals:   10/23/15 1036 10/23/15 1037  BP: (!) 148/100 beginning of session (!) 124/92 end of session        Subjective Assessment - 10/23/15 0936    Subjective Pt is walking with mom daily about 15 min/day using SBQC. Neurology CT scan 8/28.   Patient is accompained by: Family member   Currently in Pain? No/denies                         [facility] Adult PT Treatment/Exercise - 10/23/15 0001      Ambulation/Gait   Ambulation/Gait Yes   Ambulation/Gait Assistance 5: Supervision   Ambulation/Gait Assistance Details working on activity tolerance and balance progressing gait without cane.   Ambulation Distance (Feet) 400 Feet   Assistive device Small based quad cane;None   Gait Pattern Step-through pattern   Ambulation Surface Level;Indoor     Lumbar Exercises: Supine   Bridge 10 reps;3 seconds     Knee/Hip  Exercises: Sidelying   Hip ABduction AROM;Left;10 reps             Balance Exercises - 10/23/15 1038      Balance Exercises: Standing   Tandem Stance Eyes open;Intermittent upper extremity support;3 reps   SLS Eyes open;Intermittent upper extremity support;4 reps;15 secs           PT Education - 10/23/15 1003    Education provided Yes   Education Details HEP for standing balance and LLE strengthening   Person(s) Educated Patient;Parent(s)   Methods Explanation;Demonstration;Verbal cues;Handout   Comprehension Verbalized understanding;Returned demonstration;Verbal cues required;Need further instruction             PT Long Term Goals - 10/17/15 1640      PT LONG TERM GOAL #1   Title Improve gait velocity to >/= 2.8 ft/sec without cane for incr. gait efficiency.  (11-16-15)   Baseline 2.18 ft/sec with no device   Time 4   Period Weeks   Status New     PT LONG TERM GOAL #2   Title Improve TUG score to </= 11.0 secs without device for incr. safety with mobility.  (11-16-15)   Baseline 14.53 secs without device   Time 4   Period Weeks   Status New  PT LONG TERM GOAL #3   Title Incr. Berg score to >/= 53/56 for incr. safety with leisure activities and for decr. fall risk. (11-16-15)   Baseline 50/56   Time 4   Period Weeks   Status New     PT LONG TERM GOAL #4   Title Complete DGI and establish goal as appropriate.  (11-16-15)   Time 4   Period Weeks   Status New     PT LONG TERM GOAL #5   Title Amb. 1000' on all surfaces without device modified independently. (11-16-15)   Time 4   Period Weeks   Status New     Additional Long Term Goals   Additional Long Term Goals Yes     PT LONG TERM GOAL #6   Title Independent in HEP for balance and strengthening.  (11-16-15)   Time 4   Period Weeks   Status New               Plan - 10/23/15 1020    Clinical Impression Statement BP high at beginning of session but decreased by the end of session with  light activity. Pt's mom plans to discuss with MD about pt's BP medication.  Pt was asymptomatic during session.  Initiated HEP for standing balance and LE strengthening requiring cues for technique and body mechanics    Rehab Potential Good   PT Frequency 2x / week   PT Duration 4 weeks   PT Next Visit Plan Do DGI; Review HEP for LLE strengthening and high level balance activities - tandem and SLS for LLE;   PT Home Exercise Plan see above   Consulted and Agree with Plan of Care Patient;Family member/caregiver   Family Member Consulted mother      Patient will benefit from skilled therapeutic intervention in order to improve the following deficits and impairments:  Abnormal gait, Decreased balance, Decreased endurance, Decreased activity tolerance, Decreased coordination, Decreased strength  Visit Diagnosis: Unsteadiness on feet  Other abnormalities of gait and mobility  Muscle weakness (generalized)     Problem List Patient Active Problem List   Diagnosis Date Noted  . DVT prophylaxis   . Thrombocytopenia (HCC)   . Left hemiparesis (HCC)   . AKI (acute kidney injury) (HCC)   . Hyponatremia   . Dysphagia, post-stroke   . OSA (obstructive sleep apnea) suspected will need cpap when decannulated 09/05/2015  . Obesity hypoventilation syndrome (HCC)   . Tracheostomy care (HCC)   . Acute blood loss anemia   . Deep venous thrombosis (DVT) of left peroneal vein (HCC) 09/01/2015  . Dysphagia following nontraumatic intracerebral hemorrhage 09/01/2015  . Morbid obesity (HCC) 09/01/2015  . Nontraumatic acute hemorrhage of basal ganglia (HCC) 09/01/2015  . Tracheostomy in place [facility](HCC)   . Leukocytosis   . Fever, unspecified   . Right Basal ganglia hemorrhage (HCC)   . Acute respiratory failure (HCC)   . Acute respiratory failure with hypoxemia (HCC)   . Cytotoxic brain edema (HCC) 08/18/2015  . Brain herniation (HCC) 08/18/2015  . Accelerated hypertension 08/16/2015   Hortencia ConradiKarissa  Danelle Curiale, PTA  10/23/15, 10:49 AM [place] [facility] [address] [place], [place], [address] Phone: [phone]   Fax:  [phone]  Name: Brad Mcgee MRN: [id number] Date of Birth: 03/27/1985

## 2015-10-23 NOTE — Telephone Encounter (Signed)
Zanik with [facility] called to let us know that patient prescription for Enoxaparin was denied.  Further clinical documentation is needed.  Please reference prescription [id number] and Store [phone], or you can call patients insurance at [phone].

## 2015-10-23 NOTE — Telephone Encounter (Signed)
Order placed

## 2015-10-23 NOTE — Patient Instructions (Addendum)
SINGLE LIMB STANCE    Stance: single leg on floor. Raise leg. Hold _20__ seconds. Taps hands on /off counter. Repeat with other leg. _3__ reps per set, _1-2__ sets per day Copyright  VHI. All rights reserved.  Tandem Stance    Right foot in front of left, heel touching toe both feet "straight ahead". Stand on Foot Triangle of Support with both feet. Balance in this position _20__ seconds. Do with left foot in front of right. x3  Copyright  VHI. All rights reserved.   Bridging    Lying supine with knees bent, tighten stomach muscles. Raise hips off floor, tighten buttocks. Hold _3__ seconds. Repeat __10_ times. Do _1-2__ times per day.  Copyright  VHI. All rights reserved.  Abduction: Side Leg Lift (Eccentric) - Side-Lying    Lie on side. Anchor Left elbow down on bed. Lift top leg slightly higher than shoulder level. Keep top leg straight with body, toes pointing forward. Slowly lower for 3-5 seconds. _10__ reps per set, _1-2__ sets per day,http://ecce.exer.us/62   Copyright  VHI. All rights reserved.

## 2015-10-23 NOTE — Telephone Encounter (Signed)
Can we refer him for dopplers of his LE.  Thanks.

## 2015-10-23 NOTE — Therapy (Signed)
[facility] [address] [place], [place], [address] Phone: [phone]   Fax:  [phone]  Occupational Therapy Treatment  Patient Details  Name: Brad Mcgee MRN: [id number] Date of Birth: 05/30/1985 Referring Provider: Dr. Marcello FennelAnkit Anil Patel  Encounter Date: 10/23/2015      OT End of Session - 10/23/15 0846    Visit Number 2   Number of Visits 8   Date for OT Re-Evaluation 11/14/15   Authorization Type UHC   Authorization Time Period 60 visit limit for OT   OT Start Time 0801   OT Stop Time 0843   OT Time Calculation (min) 42 min   Activity Tolerance Patient tolerated treatment well      Past Medical History:  Diagnosis Date  . CVA (cerebral vascular accident) (HCC)   . DVT (deep venous thrombosis) (HCC)   . Fracture of left ankle    treated with cast  . Hypertension     Past Surgical History:  Procedure Laterality Date  . greenfield filter      Vitals:   10/23/15 0813  BP: (!) 151/101  Pulse: 92        Subjective Assessment - 10/23/15 0805    Subjective  I started taking my BP medicine twice a day   Pertinent History see epic snapshot, R BG CVA , HTN.Marland Kitchen. CHECK BP!!!   Patient Stated Goals Strengthen my leg and work on my left arm   Currently in Pain? No/denies                      OT Treatments/Exercises (OP) - 10/23/15 0001      ADLs   ADL Comments Reviewed risk factors of stroke with pt and mom as pt 's BP was elevated again at the beginning of the session (reviewed while waiting for BP to come down after walking in from parking lot. BP at end of session after table top activities 144/97  HR 85     Fine Motor Coordination   Other Fine Motor Exercises Pt issued HEP for fine motor and in hand manipulation tasks  Reviewed all tasks and had pt return demonstrate. Pt with min to moderate difficulty depending on task and requires increased time.                 OT  Education - 10/23/15 0844    Education provided Yes   Education Details HEP for coodination   Person(s) Educated Patient   Methods Explanation;Demonstration;Verbal cues;Handout   Comprehension Verbalized understanding;Returned demonstration             OT Long Term Goals - 10/23/15 0844      OT LONG TERM GOAL #1   Title Pt will be mod I with HEP - 11/13/2015   Status On-going     OT LONG TERM GOAL #2   Title Pt will demonstrate improved coordination as evidenced by decreasing time on 9 hole peg test by at least 15 seconds (baseline= 1.02.59)   Status On-going     OT LONG TERM GOAL #3   Title Pt demonstrate 5/5 proximal shoulder strenghth in LUE to assist with functional tasks   Status On-going               Plan - 10/23/15 0844    Clinical Impression Statement Pt making slow progress toward goals. Pt needs reinforcement for new learning.  Pt's BP continues to be high and mom calling MD again  during session.   Rehab Potential Good   Clinical Impairments Affecting Rehab Potential uncontrolled BP - pt and pt's father  instructed to follow up today for high BP   OT Frequency 2x / week   OT Duration 4 weeks   OT Treatment/Interventions Self-care/ADL training;Neuromuscular education;Therapeutic exercise;Manual Therapy;Therapeutic activities;Patient/family education   Plan check FM HEP;  if BP controlled give HEP for proximal strength   Consulted and Agree with Plan of Care Patient;Family member/caregiver   Family Member Consulted mom      Patient will benefit from skilled therapeutic intervention in order to improve the following deficits and impairments:  Cardiopulmonary status limiting activity, Decreased coordination, Decreased strength, Impaired UE functional use, Obesity  Visit Diagnosis: Hemiplegia, unspecified affecting right dominant side (HCC)  Muscle weakness (generalized)  Other lack of coordination    Problem List Patient Active Problem List    Diagnosis Date Noted  . DVT prophylaxis   . Thrombocytopenia (HCC)   . Left hemiparesis (HCC)   . AKI (acute kidney injury) (HCC)   . Hyponatremia   . Dysphagia, post-stroke   . OSA (obstructive sleep apnea) suspected will need cpap when decannulated 09/05/2015  . Obesity hypoventilation syndrome (HCC)   . Tracheostomy care (HCC)   . Acute blood loss anemia   . Deep venous thrombosis (DVT) of left peroneal vein (HCC) 09/01/2015  . Dysphagia following nontraumatic intracerebral hemorrhage 09/01/2015  . Morbid obesity (HCC) 09/01/2015  . Nontraumatic acute hemorrhage of basal ganglia (HCC) 09/01/2015  . Tracheostomy in place [facility])   . Leukocytosis   . Fever, unspecified   . Right Basal ganglia hemorrhage (HCC)   . Acute respiratory failure (HCC)   . Acute respiratory failure with hypoxemia (HCC)   . Cytotoxic brain edema (HCC) 08/18/2015  . Brain herniation (HCC) 08/18/2015  . Accelerated hypertension 08/16/2015    Norton Pastel, OTR/L 10/23/2015, 8:47 AM  [place] [facility] [address] [place], [place], [address] Phone: [phone]   Fax:  [phone]  Name: Brad Mcgee MRN: [id number] Date of Birth: May 01, 1985

## 2015-10-23 NOTE — Patient Instructions (Signed)
  Coordination Activities  Perform the following activities for 15-20 minutes 1-2 times per day with left hand(s).   Rotate ball in fingertips (clockwise and counter-clockwise).  Toss ball between hands.  Toss ball in air and catch with the same hand.  Flip cards 1 at a time as fast as you can.  Deal cards with your thumb (Hold deck in hand and push card off top with thumb).  Pick up coins and place in container or coin bank.  Pick up coins and stack.  Screw together nuts and bolts, then unfasten.   When stacking coins start with trying to stack 4 in a row and work toward five.  With cards make sure you flip both ways.

## 2015-10-24 ENCOUNTER — Ambulatory Visit: Payer: 59 | Admitting: Occupational Therapy

## 2015-10-24 ENCOUNTER — Encounter: Payer: Self-pay | Admitting: Occupational Therapy

## 2015-10-24 ENCOUNTER — Telehealth: Payer: Self-pay | Admitting: Physical Medicine & Rehabilitation

## 2015-10-24 DIAGNOSIS — R2681 Unsteadiness on feet: Secondary | ICD-10-CM | POA: Diagnosis not present

## 2015-10-24 DIAGNOSIS — R0683 Snoring: Secondary | ICD-10-CM

## 2015-10-24 DIAGNOSIS — G8191 Hemiplegia, unspecified affecting right dominant side: Secondary | ICD-10-CM

## 2015-10-24 DIAGNOSIS — R278 Other lack of coordination: Secondary | ICD-10-CM

## 2015-10-24 DIAGNOSIS — M6281 Muscle weakness (generalized): Secondary | ICD-10-CM

## 2015-10-24 NOTE — Telephone Encounter (Signed)
Mother is calling to let us know that [employer] [employer] is faxing over another prior authorization for patients enoxaparin.  They need to know why he is using this and it can be filled out by nurse or doctor and faxed back to them today and they would fill it.  Any questions please call Sherridan Day back at [phone].

## 2015-10-24 NOTE — Telephone Encounter (Signed)
I notified Sheridan by VM that we are aware of the PA and it has been done and denied by company.  We have ordered another doppler study to followup on the DVT resolution and will proceed after the test is done.

## 2015-10-24 NOTE — Procedures (Signed)
  Patient Name: Brad Mcgee, Huber Study Date: 10/17/2015 Gender: Male D.O.B: 16-Jul-1985 Age (years): 30 Referring Provider: Delle ReiningPamela Love PA-C Height (inches): 68 Interpreting Physician: Jetty Duhamellinton Nickcole Bralley MD, ABSM Weight (lbs): 259 RPSGT: Armen PickupFord, Evelyn BMI: 39 MRN: [id number] Neck Size: 19.00 CLINICAL INFORMATION Sleep Study Type: NPSG   Indication for sleep study: OSA   Epworth Sleepiness Score:  2/24   SLEEP STUDY TECHNIQUE As per the AASM Manual for the Scoring of Sleep and Associated Events v2.3 (April 2016) with a hypopnea requiring 4% desaturations. The channels recorded and monitored were frontal, central and occipital EEG, electrooculogram (EOG), submentalis EMG (chin), nasal and oral airflow, thoracic and abdominal wall motion, anterior tibialis EMG, snore microphone, electrocardiogram, and pulse oximetry.  MEDICATIONS Patient's medications include: charted for review. Medications self-administered by patient during sleep study : No sleep medicine administered.  SLEEP ARCHITECTURE The study was initiated at 10:03:07 PM and ended at 4:58:38 AM. Sleep onset time was 42.2 minutes and the sleep efficiency was 80.4%. The total sleep time was 334.0 minutes. Stage REM latency was 190.0 minutes. The patient spent 7.63% of the night in stage N1 sleep, 66.32% in stage N2 sleep, 0.00% in stage N3 and 26.05% in REM. Alpha intrusion was absent. Supine sleep was 18.73%. Wake after sleep onset 39 minutes  RESPIRATORY PARAMETERS The overall apnea/hypopnea index (AHI) was 37.4 per hour. There were 171 total apneas, including 171 obstructive, 0 central and 0 mixed apneas. There were 37 hypopneas and 90 RERAs. The AHI during Stage REM sleep was 60.0 per hour. AHI while supine was 65.2 per hour. The mean oxygen saturation was 96.43%. The minimum SpO2 during sleep was 81.00%. Moderate snoring was noted during this study.  CARDIAC DATA The 2 lead EKG demonstrated sinus rhythm. The mean heart  rate was 73.99 beats per minute. Other EKG findings include: None.  LEG MOVEMENT DATA The total PLMS were 167 with a resulting PLMS index of 30.00. Associated arousal with leg movement index was 0.9 .  IMPRESSIONS - Severe obstructive sleep apnea occurred during this study (AHI = 37.4/h). - Insufficient early events and sleep to meet protocol requirements for split CPAP titration - No significant central sleep apnea occurred during this study (CAI = 0.0/h). - Mild oxygen desaturation was noted during this study (Min O2 = 81.00%). - The patient snored with Moderate snoring volume. - No cardiac abnormalities were noted during this study. - Moderate periodic limb movements of sleep occurred during the study. No significant associated arousals.  DIAGNOSIS - Obstructive Sleep Apnea (327.23 [G47.33 ICD-10])  RECOMMENDATIONS - Therapeutic CPAP titration to determine optimal pressure required to alleviate sleep disordered breathing. - Positional therapy avoiding supine position during sleep. - Avoid alcohol, sedatives and other CNS depressants that may worsen sleep apnea and disrupt normal sleep architecture. - Sleep hygiene should be reviewed to assess factors that may improve sleep quality. - Weight management and regular exercise should be initiated or continued if appropriate.  [Electronically signed] 10/24/2015 02:38 PM  Jetty Duhamellinton Ayren Zumbro MD, ABSM Diplomate, American Board of Sleep Medicine   NPI: [id number]  Waymon BudgeYOUNG,Clover Feehan D Diplomate, American Board of Sleep Medicine  ELECTRONICALLY SIGNED ON:  10/24/2015, 2:35 PM [facility] PH: [phone]   FX: [phone] ACCREDITED BY THE AMERICAN ACADEMY OF SLEEP MEDICINE

## 2015-10-24 NOTE — Therapy (Signed)
[facility] [address] [place], [place], [address] Phone: [phone]   Fax:  [phone]  Occupational Therapy Treatment  Patient Details  Name: Brad Mcgee MRN: [id number] Date of Birth: 1985-07-19 Referring Provider: Dr. Marcello Fennel  Encounter Date: 10/24/2015      OT End of Session - 10/24/15 1231    Visit Number 3   Number of Visits 8   Date for OT Re-Evaluation 11/14/15   Authorization Type UHC   Authorization Time Period 60 visit limit for OT   OT Start Time 1017   OT Stop Time 1059   OT Time Calculation (min) 42 min   Activity Tolerance Patient tolerated treatment well      Past Medical History:  Diagnosis Date  . CVA (cerebral vascular accident) (HCC)   . DVT (deep venous thrombosis) (HCC)   . Fracture of left ankle    treated with cast  . Hypertension     Past Surgical History:  Procedure Laterality Date  . greenfield filter      There were no vitals filed for this visit.      Subjective Assessment - 10/24/15 1026    Subjective  I haven't taken my blood thinner in 2 days - they need  more documentation to fill the prescription.   Pertinent History see epic snapshot, R BG CVA , HTN.Marland Kitchen CHECK BP!!!   Patient Stated Goals Strengthen my leg and work on my left arm   Currently in Pain? No/denies                      OT Treatments/Exercises (OP) - 10/24/15 0001      ADLs   ADL Comments Pt's BP elevated again today at beginning of session ( 151/106, HR 76)  After 10 minute rest, BP at 144/97 HR 75 so proceeded with caution for putty exercises seated at table. While resting again reviewed risk factors, warning signs and symptoms with pt and mom and provided education via handout. Both verbalized understanding.     Exercises   Exercises Hand     Hand Exercises   Theraputty Flatten;Roll;Grip   Other Hand Exercises Pt given green putty for use in HEP for improving L hand  strenght. Pt requires demonstration, repetition for new learning.  After instruction and repetition pt able to return demonstrate and  mom able to verbalize understanding.                  OT Education - 10/23/15 0844    Education provided Yes   Education Details HEP for coodination   Person(s) Educated Patient   Methods Explanation;Demonstration;Verbal cues;Handout   Comprehension Verbalized understanding;Returned demonstration             OT Long Term Goals - 10/24/15 1227      OT LONG TERM GOAL #1   Title Pt will be mod I with HEP - 11/13/2015   Status On-going     OT LONG TERM GOAL #2   Title Pt will demonstrate improved coordination as evidenced by decreasing time on 9 hole peg test by at least 15 seconds (baseline= 1.02.59)   Status On-going     OT LONG TERM GOAL #3   Title Pt demonstrate 5/5 proximal shoulder strenghth in LUE to assist with functional tasks   Status On-going               Plan - 10/24/15 1227    Clinical  Impression Statement Pt progressing toward goals. Pt remains with unstable BP and difficulty gaining approval for blood thinner.    Rehab Potential Good   Clinical Impairments Affecting Rehab Potential uncontrolled BP - pt and pt's father  instructed to follow up today for high BP   OT Frequency 2x / week   OT Duration 4 weeks   OT Treatment/Interventions Self-care/ADL training;Neuromuscular education;Therapeutic exercise;Manual Therapy;Therapeutic activities;Patient/family education   Plan CHECK BP!! check putty HEP.  IF BP allows proceed with proximal strength program otherwise place on hold until BP controlled   Consulted and Agree with Plan of Care Patient;Family member/caregiver   Family Member Consulted mom      Patient will benefit from skilled therapeutic intervention in order to improve the following deficits and impairments:  Cardiopulmonary status limiting activity, Decreased coordination, Decreased strength, Impaired UE  functional use, Obesity  Visit Diagnosis: Hemiplegia, unspecified affecting right dominant side (HCC)  Muscle weakness (generalized)  Other lack of coordination    Problem List Patient Active Problem List   Diagnosis Date Noted  . DVT prophylaxis   . Thrombocytopenia (HCC)   . Left hemiparesis (HCC)   . AKI (acute kidney injury) (HCC)   . Hyponatremia   . Dysphagia, post-stroke   . OSA (obstructive sleep apnea) suspected will need cpap when decannulated 09/05/2015  . Obesity hypoventilation syndrome (HCC)   . Tracheostomy care (HCC)   . Acute blood loss anemia   . Deep venous thrombosis (DVT) of left peroneal vein (HCC) 09/01/2015  . Dysphagia following nontraumatic intracerebral hemorrhage 09/01/2015  . Morbid obesity (HCC) 09/01/2015  . Nontraumatic acute hemorrhage of basal ganglia (HCC) 09/01/2015  . Tracheostomy in place [facility](HCC)   . Leukocytosis   . Fever, unspecified   . Right Basal ganglia hemorrhage (HCC)   . Acute respiratory failure (HCC)   . Acute respiratory failure with hypoxemia (HCC)   . Cytotoxic brain edema (HCC) 08/18/2015  . Brain herniation (HCC) 08/18/2015  . Accelerated hypertension 08/16/2015    Norton PastelPulaski, Karen Halliday, OTR/L 10/24/2015, 12:33 PM  [facility] [address] [place], [place], [address] Phone: [phone]   Fax:  [phone]  Name: Brad Mcgee MRN: [id number] Date of Birth: 03/02/1986

## 2015-10-25 ENCOUNTER — Telehealth: Payer: Self-pay | Admitting: *Deleted

## 2015-10-25 ENCOUNTER — Ambulatory Visit (HOSPITAL_COMMUNITY)
Admission: RE | Admit: 2015-10-25 | Discharge: 2015-10-25 | Disposition: A | Discharge status: Home / Self Care | Payer: 59 | Source: Ambulatory Visit | Attending: Physical Medicine & Rehabilitation | Admitting: Physical Medicine & Rehabilitation

## 2015-10-25 DIAGNOSIS — I82492 Acute embolism and thrombosis of other specified deep vein of left lower extremity: Secondary | ICD-10-CM | POA: Diagnosis not present

## 2015-10-25 DIAGNOSIS — I82452 Acute embolism and thrombosis of left peroneal vein: Secondary | ICD-10-CM

## 2015-10-25 NOTE — Telephone Encounter (Signed)
Notified DVT resolved and no further  lovenox required. Shawons's mom asked if he should take an aspirin or anything as precaution.

## 2015-10-25 NOTE — Telephone Encounter (Signed)
It is not necessary at present.  Thanks.

## 2015-10-25 NOTE — Telephone Encounter (Signed)
Doppler study for left  peroneal vein DVT reports it has resolved

## 2015-10-25 NOTE — Progress Notes (Signed)
Preliminary results by tech - Left Lower Extremity Venous Completed. There is resolution of previous deep vein thrombosis in the peroneal veins from prior study. No evidence of DVT or SVT in the left leg.  Marilynne Halstedita Saber Dickerman, BS, RDMS, RVT

## 2015-10-25 NOTE — Telephone Encounter (Signed)
Great. Can we let the patient know he does not need Lovenox anymore.  Thanks.

## 2015-10-26 ENCOUNTER — Ambulatory Visit: Payer: 59 | Admitting: Physical Therapy

## 2015-10-26 NOTE — Telephone Encounter (Signed)
Notified Sheridan. She has [place] set up to see a [place] PCP 11/09/15

## 2015-10-30 ENCOUNTER — Ambulatory Visit: Payer: 59 | Admitting: Physical Therapy

## 2015-10-30 ENCOUNTER — Ambulatory Visit: Payer: 59 | Admitting: Occupational Therapy

## 2015-10-30 VITALS — BP 137/96 | HR 81

## 2015-10-30 VITALS — BP 137/96

## 2015-10-30 DIAGNOSIS — R2681 Unsteadiness on feet: Secondary | ICD-10-CM | POA: Diagnosis not present

## 2015-10-30 DIAGNOSIS — M6281 Muscle weakness (generalized): Secondary | ICD-10-CM

## 2015-10-30 DIAGNOSIS — G8191 Hemiplegia, unspecified affecting right dominant side: Secondary | ICD-10-CM

## 2015-10-30 DIAGNOSIS — R2689 Other abnormalities of gait and mobility: Secondary | ICD-10-CM

## 2015-10-30 NOTE — Therapy (Signed)
[facility] [address] [place], [place], [address] Phone: [phone]   Fax:  [phone]  Occupational Therapy Treatment  Patient Details  Name: [name] M Trull MRN: [id number] Date of Birth: 03/16/1986 Referring Provider: Dr. Marcello FennelAnkit Anil Patel  Encounter Date: 10/30/2015      OT End of Session - 10/30/15 1257    Visit Number 4   Number of Visits 8   Date for OT Re-Evaluation 11/14/15   Authorization Type UHC   Authorization Time Period 60 visit limit for OT   OT Start Time 0848   OT Stop Time 0915   OT Time Calculation (min) 27 min   Activity Tolerance Patient tolerated treatment well      Past Medical History:  Diagnosis Date  . CVA (cerebral vascular accident) (HCC)   . DVT (deep venous thrombosis) (HCC)   . Fracture of left ankle    treated with cast  . Hypertension     Past Surgical History:  Procedure Laterality Date  . greenfield filter      Vitals:   10/30/15 0851  BP: (!) 137/96  Pulse: 81        Subjective Assessment - 10/30/15 1252    Subjective  I don'thave to take the blood thinner any more but my BP is still to high   Patient is accompained by: Family member  mom   Pertinent History see epic snapshot, R BG CVA , HTN.Marland Kitchen. CHECK BP!!!   Patient Stated Goals Strengthen my leg and work on my left arm   Currently in Pain? No/denies                                   OT Long Term Goals - 10/30/15 1253      OT LONG TERM GOAL #1   Title Pt will be mod I with HEP - 11/13/2015   Status On-going     OT LONG TERM GOAL #2   Title Pt will demonstrate improved coordination as evidenced by decreasing time on 9 hole peg test by at least 15 seconds (baseline= 1.02.59)   Status On-going     OT LONG TERM GOAL #3   Title Pt demonstrate 5/5 proximal shoulder strenghth in LUE to assist with functional tasks   Status On-going               Plan - 10/30/15 1253     Clinical Impression Statement Pt continues with elevated BP with diastolic raising to 105 with activity with PT. Given that the only remaining HEP pt requires is aggressive resistive exercises for mild shoulder weakness feel pt will need to have better control of BP.  Pt scheduled to see MD on 11/09/2015 to address BP - will place on hold until after this visit with hope that BP will be better managed and will be able to instruct pt in these exercises. Pt and mom in agreement. Pt is compliant with putty and coordination HEP at this time.    Rehab Potential Good   Clinical Impairments Affecting Rehab Potential uncontrolled BP - pt and pt's father  instructed to follow up today for high BP   OT Frequency 2x / week   OT Duration 4 weeks   OT Treatment/Interventions Self-care/ADL training;Neuromuscular education;Therapeutic exercise;Manual Therapy;Therapeutic activities;Patient/family education   Plan place on hold until after MD appt for BP on 11/09/2015   Consulted and Agree with  Plan of Care Patient;Family member/caregiver   Family Member Consulted mom      Patient will benefit from skilled therapeutic intervention in order to improve the following deficits and impairments:  Cardiopulmonary status limiting activity, Decreased coordination, Decreased strength, Impaired UE functional use, Obesity  Visit Diagnosis: Hemiplegia, unspecified affecting right dominant side [facility](HCC)    Problem List Patient Active Problem List   Diagnosis Date Noted  . DVT prophylaxis   . Thrombocytopenia (HCC)   . Left hemiparesis (HCC)   . AKI (acute kidney injury) (HCC)   . Hyponatremia   . Dysphagia, post-stroke   . OSA (obstructive sleep apnea) suspected will need cpap when decannulated 09/05/2015  . Obesity hypoventilation syndrome (HCC)   . Tracheostomy care (HCC)   . Acute blood loss anemia   . Deep venous thrombosis (DVT) of left peroneal vein (HCC) 09/01/2015  . Dysphagia following nontraumatic  intracerebral hemorrhage 09/01/2015  . Morbid obesity (HCC) 09/01/2015  . Nontraumatic acute hemorrhage of basal ganglia (HCC) 09/01/2015  . Tracheostomy in place [facility](HCC)   . Leukocytosis   . Fever, unspecified   . Right Basal ganglia hemorrhage (HCC)   . Acute respiratory failure (HCC)   . Acute respiratory failure with hypoxemia (HCC)   . Cytotoxic brain edema (HCC) 08/18/2015  . Brain herniation (HCC) 08/18/2015  . Accelerated hypertension 08/16/2015    Norton PastelPulaski, Karen Halliday, OTR/L 10/30/2015, 12:59 PM  [facility] [address] [place], [place], [address] Phone: [phone]   Fax:  [phone]  Name: [name] M Brechtel MRN: [id number] Date of Birth: 08/21/1985

## 2015-10-31 ENCOUNTER — Ambulatory Visit: Payer: 59 | Admitting: Occupational Therapy

## 2015-10-31 NOTE — Therapy (Signed)
[facility] [address] [place], [place], [address] Phone: [phone]   Fax:  [phone]  Physical Therapy Treatment  Patient Details  Name: Brad Mcgee MRN: [id number] Date of Birth: 09/23/1985 Referring Provider: Dr. Maryla MorrowAnkit Patel  Encounter Date: 10/30/2015      PT End of Session - 10/31/15 1710    Visit Number 3   Number of Visits 9   Date for PT Re-Evaluation 11/16/15   Authorization Type UHC   Authorization - Visit Number 3   Authorization - Number of Visits 60   PT Start Time 0802   PT Stop Time 0845   PT Time Calculation (min) 43 min      Past Medical History:  Diagnosis Date  . CVA (cerebral vascular accident) (HCC)   . DVT (deep venous thrombosis) (HCC)   . Fracture of left ankle    treated with cast  . Hypertension     Past Surgical History:  Procedure Laterality Date  . greenfield filter      Vitals:   10/30/15 0842 10/30/15 0843 10/30/15 0850  BP: (!) 150/99 (!) 147/97 (!) 137/96        Subjective Assessment - 10/31/15 1705    Subjective Mom states pt [place] - has been taking BP at home but continues to run high- doesn't want to go to [place] and have them change anything since he is going to MD next week   Patient Stated Goals To walk without cane and improve balance                         [facility] Adult PT Treatment/Exercise - 10/31/15 0001      Dynamic Gait Index   Level Surface Mild Impairment   Change in Gait Speed Mild Impairment   Gait with Horizontal Head Turns Mild Impairment   Gait with Vertical Head Turns Mild Impairment   Gait and Pivot Turn Mild Impairment   Step Over Obstacle Normal   Step Around Obstacles Normal   Steps Mild Impairment   Total Score 18      TherEx:  LLE strengthening exercises - plantarflexion in standing - bil. LE's - 10 reps; LLE only 10 reps in standing L hip abduction,  flexion and extension with green theraband 10 reps each in standing  Sit to stand with R foot on step for incr. LLE weight bearing - 10 reps with minimal UE support  Neuro Re-ed; L single limb stance activities - on floor with CGA for safety - touching targets and alternate tap ups to 6" step Standing on blue AirEx - marching with CGA for safety Amb. on tip toes for PF strengthening and for improved balance - inside parallel bars -10' x 2 reps   Step ups LLE 10 reps              PT Long Term Goals - 10/31/15 1718      PT LONG TERM GOAL #1   Title Improve gait velocity to >/= 2.8 ft/sec without cane for incr. gait efficiency.  (11-16-15)   Status New     PT LONG TERM GOAL #2   Title Improve TUG score to </= 11.0 secs without device for incr. safety with mobility.  (11-16-15)   Status New     PT LONG TERM GOAL #3   Title Incr. Berg score to >/= 53/56 for incr. safety  with leisure activities and for decr. fall risk. (11-16-15)   Status New     PT LONG TERM GOAL #4   Title Complete DGI and establish goal as appropriate.  (11-16-15)  - Incr. DGI score to >/= 22/24 to reduce fall risk.  (established 10-31-15 by SD)      PT LONG TERM GOAL #5   Title Amb. 1000' on all surfaces without device modified independently. (11-16-15)   Status New     PT LONG TERM GOAL #6   Title Independent in HEP for balance and strengthening.  (11-16-15)   Status New               Plan - 10/31/15 1712    Clinical Impression Statement Pt's BP decreased at end of session - participation in activities has been limited by HTN with treatment modified to prevent BP elevation:  pt continues to be asymptomatic during session - pt had some mild c/o fatigue after performing theraband exercises                                                      Rehab Potential Good   PT Frequency 2x / week   PT Duration 4 weeks   PT Treatment/Interventions ADLs/Self Care Home Management;Gait training;Stair  training;Functional mobility training;Therapeutic activities;Patient/family education;Neuromuscular re-education;Balance training;Therapeutic exercise   PT Next Visit Plan cont high level balance and gait training - LLE strengthening   PT Home Exercise Plan see above   Consulted and Agree with Plan of Care Patient;Family member/caregiver   Family Member Consulted mother      Patient will benefit from skilled therapeutic intervention in order to improve the following deficits and impairments:  Abnormal gait, Decreased balance, Decreased endurance, Decreased activity tolerance, Decreased coordination, Decreased strength  Visit Diagnosis: Other abnormalities of gait and mobility  Muscle weakness (generalized)     Problem List Patient Active Problem List   Diagnosis Date Noted  . DVT prophylaxis   . Thrombocytopenia (HCC)   . Left hemiparesis (HCC)   . AKI (acute kidney injury) (HCC)   . Hyponatremia   . Dysphagia, post-stroke   . OSA (obstructive sleep apnea) suspected will need cpap when decannulated 09/05/2015  . Obesity hypoventilation syndrome (HCC)   . Tracheostomy care (HCC)   . Acute blood loss anemia   . Deep venous thrombosis (DVT) of left peroneal vein (HCC) 09/01/2015  . Dysphagia following nontraumatic intracerebral hemorrhage 09/01/2015  . Morbid obesity (HCC) 09/01/2015  . Nontraumatic acute hemorrhage of basal ganglia (HCC) 09/01/2015  . Tracheostomy in place [facility](HCC)   . Leukocytosis   . Fever, unspecified   . Right Basal ganglia hemorrhage (HCC)   . Acute respiratory failure (HCC)   . Acute respiratory failure with hypoxemia (HCC)   . Cytotoxic brain edema (HCC) 08/18/2015  . Brain herniation (HCC) 08/18/2015  . Accelerated hypertension 08/16/2015    Kary Kosilday, Alastor Kneale Suzanne, PT 10/31/2015, 5:23 PM  [facility] [address] [place], [place], [address] Phone: [phone]   Fax:   [phone]  Name: Brad Mcgee MRN: [id number] Date of Birth: 06/16/1985

## 2015-11-01 ENCOUNTER — Ambulatory Visit: Payer: 59 | Admitting: Physical Therapy

## 2015-11-01 VITALS — BP 150/97

## 2015-11-01 DIAGNOSIS — R2681 Unsteadiness on feet: Secondary | ICD-10-CM

## 2015-11-01 DIAGNOSIS — M6281 Muscle weakness (generalized): Secondary | ICD-10-CM

## 2015-11-01 DIAGNOSIS — R2689 Other abnormalities of gait and mobility: Secondary | ICD-10-CM

## 2015-11-01 NOTE — Therapy (Signed)
[facility] [address] [place], [place], [address] Phone: [phone]   Fax:  [phone]  Physical Therapy Treatment  Patient Details  Name: Brad Mcgee MRN: [id number] Date of Birth: 06/01/1985 Referring Provider: Dr. Maryla MorrowAnkit Patel  Encounter Date: 11/01/2015      PT End of Session - 11/01/15 1018    Visit Number 4   Number of Visits 9   Date for PT Re-Evaluation 11/16/15   Authorization Type [employer]   Authorization - Visit Number 3   Authorization - Number of Visits 60   PT Start Time 0935   PT Stop Time 1015   PT Time Calculation (min) 40 min   Equipment Utilized During Treatment Gait belt   Activity Tolerance Patient tolerated treatment well   Behavior During Therapy WFL for tasks assessed/performed      Past Medical History:  Diagnosis Date  . CVA (cerebral vascular accident) (HCC)   . DVT (deep venous thrombosis) (HCC)   . Fracture of left ankle    treated with cast  . Hypertension     Past Surgical History:  Procedure Laterality Date  . greenfield filter      Vitals:   11/01/15 1247 11/01/15 1248  BP: (!) 148/99 beginning of session (!) 150/97 after exercise        Subjective Assessment - 11/01/15 0940    Subjective Mom states pt [place] [facility] [facility] [place] [place] - has been taking BP at home but continues to run high- doesn't want to go to [place] and have them change anything since he is going to MD next week   Patient Stated Goals To walk without cane and improve balance   Currently in Pain? No/denies                              Balance Exercises - 11/01/15 1250      Balance Exercises: Standing   Rockerboard Anterior/posterior;Head turns;EO;10 reps  static balance with head turns progressing to ankle hip strategies   Tandem Gait Forward;Intermittent upper extremity support;Foam/compliant surface;4 reps   Sidestepping Foam/compliant  support;Upper extremity support;5 reps  intermittent UE support   Step Over Hurdles / Cones Alternate toe tapping working on SLS and coordination  Min guard, no device    Sit<> stands: no UE support, stagger stance with right foot in back 5x2 Mod I       PT Education - 11/01/15 1254    Education provided Yes   Education Details Discussed elevated BP issue with primary PT this morning and PT recommended doing a "light session" and then have pt go on hold till he sees the MD about BP 11/09/15.   Person(s) Educated Patient;Parent(s)   Methods Explanation   Comprehension Verbalized understanding             PT Long Term Goals - 10/31/15 1718      PT LONG TERM GOAL #1   Title Improve gait velocity to >/= 2.8 ft/sec without cane for incr. gait efficiency.  (11-16-15)   Status New     PT LONG TERM GOAL #2   Title Improve TUG score to </= 11.0 secs without device for incr. safety with mobility.  (11-16-15)   Status New     PT LONG TERM GOAL #3   Title Incr. Berg score to >/= 53/56 for incr. safety with leisure activities and for  decr. fall risk. (11-16-15)   Status New     PT LONG TERM GOAL #4   Title Complete DGI and establish goal as appropriate.  (11-16-15)  - Incr. DGI score to >/= 22/24 to reduce fall risk.  (established 10-31-15 by SD)      PT LONG TERM GOAL #5   Title Amb. 1000' on all surfaces without device modified independently. (11-16-15)   Status New     PT LONG TERM GOAL #6   Title Independent in HEP for balance and strengthening.  (11-16-15)   Status New               Plan - 11/01/15 1257    Clinical Impression Statement Discussed elevated BP issue with primary PT this morning and PT recommended doing a "light session" and then have pt go on hold till he sees the MD about BP 11/09/15.  Pt requires supervison with SLS task and intermittent UE support for dynamic balance on compliant surface.   Rehab Potential Good   PT Frequency 2x / week   PT Duration 4  weeks   PT Treatment/Interventions ADLs/Self Care Home Management;Gait training;Stair training;Functional mobility training;Therapeutic activities;Patient/family education;Neuromuscular re-education;Balance training;Therapeutic exercise   PT Next Visit Plan Hold till after MD visit 11/09/15. cont high level balance and gait training - LLE strengthening   PT Home Exercise Plan see above   Consulted and Agree with Plan of Care Patient;Family member/caregiver   Family Member Consulted mother      Patient will benefit from skilled therapeutic intervention in order to improve the following deficits and impairments:  Abnormal gait, Decreased balance, Decreased endurance, Decreased activity tolerance, Decreased coordination, Decreased strength  Visit Diagnosis: Unsteadiness on feet  Other abnormalities of gait and mobility  Muscle weakness (generalized)     Problem List Patient Active Problem List   Diagnosis Date Noted  . DVT prophylaxis   . Thrombocytopenia (HCC)   . Left hemiparesis (HCC)   . AKI (acute kidney injury) (HCC)   . Hyponatremia   . Dysphagia, post-stroke   . OSA (obstructive sleep apnea) suspected will need cpap when decannulated 09/05/2015  . Obesity hypoventilation syndrome (HCC)   . Tracheostomy care (HCC)   . Acute blood loss anemia   . Deep venous thrombosis (DVT) of left peroneal vein (HCC) 09/01/2015  . Dysphagia following nontraumatic intracerebral hemorrhage 09/01/2015  . Morbid obesity (HCC) 09/01/2015  . Nontraumatic acute hemorrhage of basal ganglia (HCC) 09/01/2015  . Tracheostomy in place [facility](HCC)   . Leukocytosis   . Fever, unspecified   . Right Basal ganglia hemorrhage (HCC)   . Acute respiratory failure (HCC)   . Acute respiratory failure with hypoxemia (HCC)   . Cytotoxic brain edema (HCC) 08/18/2015  . Brain herniation (HCC) 08/18/2015  . Accelerated hypertension 08/16/2015    Hortencia ConradiKarissa Jauna Raczynski, PTA  11/01/15, 1:03 PM [facility] [address] [place], [place], [address] Phone: [phone]   Fax:  [phone]  Name: Brad Mcgee MRN: [id number] Date of Birth: 01/31/1986

## 2015-11-06 ENCOUNTER — Ambulatory Visit: Payer: 59 | Admitting: Physical Therapy

## 2015-11-06 ENCOUNTER — Encounter: Payer: 59 | Admitting: Occupational Therapy

## 2015-11-08 ENCOUNTER — Encounter: Payer: 59 | Admitting: Occupational Therapy

## 2015-11-08 ENCOUNTER — Ambulatory Visit: Payer: 59 | Admitting: Physical Therapy

## 2015-11-09 ENCOUNTER — Encounter: Payer: Self-pay | Admitting: Internal Medicine

## 2015-11-09 ENCOUNTER — Ambulatory Visit (INDEPENDENT_AMBULATORY_CARE_PROVIDER_SITE_OTHER): Payer: 59 | Admitting: Internal Medicine

## 2015-11-09 DIAGNOSIS — K219 Gastro-esophageal reflux disease without esophagitis: Secondary | ICD-10-CM

## 2015-11-09 DIAGNOSIS — I82452 Acute embolism and thrombosis of left peroneal vein: Secondary | ICD-10-CM

## 2015-11-09 DIAGNOSIS — I610 Nontraumatic intracerebral hemorrhage in hemisphere, subcortical: Secondary | ICD-10-CM

## 2015-11-09 DIAGNOSIS — I82492 Acute embolism and thrombosis of other specified deep vein of left lower extremity: Secondary | ICD-10-CM

## 2015-11-09 DIAGNOSIS — I10 Essential (primary) hypertension: Secondary | ICD-10-CM | POA: Diagnosis not present

## 2015-11-09 NOTE — Assessment & Plan Note (Signed)
S/p IVC filter Will monitor

## 2015-11-09 NOTE — Assessment & Plan Note (Signed)
Avoid triggers Continue Pepcid as needed

## 2015-11-09 NOTE — Progress Notes (Signed)
HPI  Brad Mcgee presents to the clinic today to establish care and for management of the conditions listed below. [facility].  ICH: secondary to uncontrolled HTN, 08/2015. He has residual left side with Dr. Pearlean BrownieSethi.  DVT: while hospitalized for ICH. S/p IVC filter placement.  HTN: BP well controlled on Amlodpine, Lisinopril and HCTZ. His BP today was 136/90. ECG from 07/2015 reviewed.  GERD: Triggered by greasy food. He takes Pepcid as needed with good relief.  Flu: never Tetanus: > 10 years ago Dentist: yearly  Past Medical History:  Diagnosis Date  . CVA (cerebral vascular accident) (HCC)   . DVT (deep venous thrombosis) (HCC)   . Fracture of left ankle    treated with cast  . Hypertension     Current Outpatient Prescriptions  Medication Sig Dispense Refill  . amLODipine (NORVASC) 5 MG tablet Take 1 tablet by mouth 2 (two) times daily.    . famotidine (PEPCID) 40 MG tablet Take 1 tablet (40 mg total) by mouth daily. 30 tablet 0  . hydrochlorothiazide (HYDRODIURIL) 12.5 MG tablet Take 1 tablet by mouth daily.    Marland Kitchen. lisinopril (PRINIVIL,ZESTRIL) 20 MG tablet Take 1 tablet (20 mg total) by mouth 2 (two) times daily. 60 tablet 0  . Multiple Vitamin (MULTIVITAMIN WITH MINERALS) TABS tablet Take 1 tablet by mouth daily. 100 tablet 0   No current facility-administered medications for this visit.     Allergies  Allergen Reactions  . Bee Venom Anaphylaxis  . Coconut Oil Anaphylaxis  . Ancef [Cefazolin] Rash    Family History  Problem Relation Age of Onset  . Hypertension Mother   . Breast cancer Maternal Grandmother   . Hypertension Maternal Grandmother   . Prostate cancer Maternal Grandfather   . Hypertension Maternal Grandfather   . Hypertension Paternal Grandfather   . Diabetes Paternal Grandfather     Social History   Social History  . Marital status: Single    Spouse name: N/A  . Number of children: N/A  . Years of education: N/A    Occupational History  . Not on file.   Social History Main Topics  . Smoking status: Never Smoker  . Smokeless tobacco: Never Used  . Alcohol use No  . Drug use: No  . Sexual activity: No   Other Topics Concern  . Not on file   Social History Narrative  . No narrative on file    ROS:  Constitutional: Brad Mcgee reports fatigue. Denies fever, malaise, headache or abrupt weight changes.  HEENT: Denies eye pain, eye redness, ear pain, ringing in the ears, wax buildup, runny nose, nasal congestion, bloody nose, or sore throat. Respiratory: Denies difficulty breathing, shortness of breath, cough or sputum production.   Cardiovascular: Denies chest pain, chest tightness, palpitations or swelling in the hands or feet.  Gastrointestinal: Denies abdominal pain, bloating, constipation, diarrhea or blood in the stool.  GU: Denies frequency, urgency, pain with urination, blood in urine, odor or discharge. Musculoskeletal: Brad Mcgee reports left side weakness. Denies decrease in range of motion, muscle pain or joint pain and swelling.  Skin: Denies redness, rashes, lesions or ulcercations.  Neurological: Denies dizziness, difficulty with memory, difficulty with speech or problems with balance and coordination.  Psych: Denies anxiety, depression, SI/HI.  No other specific complaints in a complete review of systems (except as listed in HPI above).  PE:  BP 136/90   Pulse 89   Temp 98.6 F (37 C) (Oral)   Ht 5'  8" (1.727 m)   Wt 254 lb 8 oz (115.4 kg)   SpO2 98%   BMI 38.70 kg/m  Wt Readings from Last 3 Encounters:  11/09/15 254 lb 8 oz (115.4 kg)  10/17/15 259 lb (117.5 kg)  09/28/15 258 lb (117 kg)    General: Appears his stated age, obese in NAD. Skin: Dry and intact. Cardiovascular: Normal rate and rhythm. S1,S2 noted.  No murmur, rubs or gallops noted.  Pulmonary/Chest: Normal effort and positive vesicular breath sounds. No respiratory distress. No wheezes, rales or ronchi noted.   Abdomen: soft, nontender. Musculoskeletal: Strength 5/5 RUE/RLE. Strength 4/5 LUE/LLE. Neurological: Alert and oriented.  Psychiatric: Mood and affect normal. Behavior is normal. Judgment and thought content normal.    BMET    Component Value Date/Time   NA 137 09/28/2015 1434   K 3.3 (L) 09/28/2015 1434   CL 103 09/28/2015 1434   CO2 27 09/28/2015 1434   GLUCOSE 86 09/28/2015 1434   BUN <5 (L) 09/28/2015 1434   CREATININE 0.87 09/28/2015 1434   CALCIUM 9.4 09/28/2015 1434   GFRNONAA >60 09/28/2015 1434   GFRAA >60 09/28/2015 1434    Lipid Panel     Component Value Date/Time   TRIG 331 (H) 08/22/2015 0526    CBC    Component Value Date/Time   WBC 5.4 09/28/2015 1434   RBC 3.62 (L) 09/28/2015 1434   HGB 11.4 (L) 09/28/2015 1434   HCT 34.5 (L) 09/28/2015 1434   PLT 279 09/28/2015 1434   MCV 95.3 09/28/2015 1434   MCH 31.5 09/28/2015 1434   MCHC 33.0 09/28/2015 1434   RDW 13.2 09/28/2015 1434   LYMPHSABS 1.9 09/28/2015 1434   MONOABS 0.4 09/28/2015 1434   EOSABS 0.1 09/28/2015 1434   BASOSABS 0.0 09/28/2015 1434    Hgb A1C Lab Results  Component Value Date   HGBA1C 5.6 09/02/2015     Assessment and Plan:

## 2015-11-09 NOTE — Patient Instructions (Signed)
Hypertension Hypertension, commonly called high blood pressure, is when the force of blood pumping through your arteries is too strong. Your arteries are the blood vessels that carry blood from your heart throughout your body. A blood pressure reading consists of a higher number over a lower number, such as 110/72. The higher number (systolic) is the pressure inside your arteries when your heart pumps. The lower number (diastolic) is the pressure inside your arteries when your heart relaxes. Ideally you want your blood pressure below 120/80. Hypertension forces your heart to work harder to pump blood. Your arteries may become narrow or stiff. Having untreated or uncontrolled hypertension can cause heart attack, stroke, kidney disease, and other problems. RISK FACTORS Some risk factors for high blood pressure are controllable. Others are not.  Risk factors you cannot control include:   Race. You may be at higher risk if you are African American.  Age. Risk increases with age.  Gender. Men are at higher risk than women before age 45 years. After age 65, women are at higher risk than men. Risk factors you can control include:  Not getting enough exercise or physical activity.  Being overweight.  Getting too much fat, sugar, calories, or salt in your diet.  Drinking too much alcohol. SIGNS AND SYMPTOMS Hypertension does not usually cause signs or symptoms. Extremely high blood pressure (hypertensive crisis) may cause headache, anxiety, shortness of breath, and nosebleed. DIAGNOSIS To check if you have hypertension, your health care provider will measure your blood pressure while you are seated, with your arm held at the level of your heart. It should be measured at least twice using the same arm. Certain conditions can cause a difference in blood pressure between your right and left arms. A blood pressure reading that is higher than normal on one occasion does not mean that you need treatment. If  it is not clear whether you have high blood pressure, you may be asked to return on a different day to have your blood pressure checked again. Or, you may be asked to monitor your blood pressure at home for 1 or more weeks. TREATMENT Treating high blood pressure includes making lifestyle changes and possibly taking medicine. Living a healthy lifestyle can help lower high blood pressure. You may need to change some of your habits. Lifestyle changes may include:  Following the DASH diet. This diet is high in fruits, vegetables, and whole grains. It is low in salt, red meat, and added sugars.  Keep your sodium intake below 2,300 mg per day.  Getting at least 30-45 minutes of aerobic exercise at least 4 times per week.  Losing weight if necessary.  Not smoking.  Limiting alcoholic beverages.  Learning ways to reduce stress. Your health care provider may prescribe medicine if lifestyle changes are not enough to get your blood pressure under control, and if one of the following is true:  You are 18-59 years of age and your systolic blood pressure is above 140.  You are 60 years of age or older, and your systolic blood pressure is above 150.  Your diastolic blood pressure is above 90.  You have diabetes, and your systolic blood pressure is over 140 or your diastolic blood pressure is over 90.  You have kidney disease and your blood pressure is above 140/90.  You have heart disease and your blood pressure is above 140/90. Your personal target blood pressure may vary depending on your medical conditions, your age, and other factors. HOME CARE INSTRUCTIONS    Have your blood pressure rechecked as directed by your health care provider.   Take medicines only as directed by your health care provider. Follow the directions carefully. Blood pressure medicines must be taken as prescribed. The medicine does not work as well when you skip doses. Skipping doses also puts you at risk for  problems.  Do not smoke.   Monitor your blood pressure at home as directed by your health care provider. SEEK MEDICAL CARE IF:   You think you are having a reaction to medicines taken.  You have recurrent headaches or feel dizzy.  You have swelling in your ankles.  You have trouble with your vision. SEEK IMMEDIATE MEDICAL CARE IF:  You develop a severe headache or confusion.  You have unusual weakness, numbness, or feel faint.  You have severe chest or abdominal pain.  You vomit repeatedly.  You have trouble breathing. MAKE SURE YOU:   Understand these instructions.  Will watch your condition.  Will get help right away if you are not doing well or get worse.   This information is not intended to replace advice given to you by your health care provider. Make sure you discuss any questions you have with your health care provider.   Document Released: 03/04/2005 Document Revised: 07/19/2014 Document Reviewed: 12/25/2012 Elsevier Interactive Patient Education 2016 [employer]  

## 2015-11-09 NOTE — Assessment & Plan Note (Signed)
At goal on Lisinopril, Norvasc and HTCZ They will monitor and let me know if sustained > 130/90

## 2015-11-09 NOTE — Assessment & Plan Note (Signed)
No NSAIDS or ASA Continue all BP medications Continue to work with PT/OT

## 2015-11-13 ENCOUNTER — Encounter: Payer: Self-pay | Admitting: Internal Medicine

## 2015-11-13 ENCOUNTER — Encounter: Payer: Self-pay | Admitting: Neurology

## 2015-11-13 ENCOUNTER — Ambulatory Visit (INDEPENDENT_AMBULATORY_CARE_PROVIDER_SITE_OTHER): Payer: 59 | Admitting: Neurology

## 2015-11-13 VITALS — BP 134/82 | HR 100 | Ht 68.0 in | Wt 253.4 lb

## 2015-11-13 DIAGNOSIS — I610 Nontraumatic intracerebral hemorrhage in hemisphere, subcortical: Secondary | ICD-10-CM

## 2015-11-13 NOTE — Progress Notes (Signed)
[facility] [address] [place]. [place] [address] [phone]       OFFICE FOLLOW-UP NOTE  Mr. [name] M Smedberg Date of Birth:  12/31/1985 Medical Record Number:  [id number]   HPI: Mr Manson PasseyBrown is a [age] year Caucasian male seen today for first office follow-up visit following intracerebral hemorrhage in May 2017. He is accompanied today by his father and mother.[name] M Hosein is a 30 y.o. male with a history of hypertension who presents with ICH to [facility]. He was apparently normal at 9:30 pm 08/15/2015, then developed witnessed onset left sided weakness at that time. He was taken [facility] where he was found to have a basal ganglia ICH. Following the initial CT scan, he had subsequent worsening with depressed mental status. Due to this, he required intubation. He is a history of long standing hypertension. Intracerebral Hemorrhage (ICH) ScoreTotal: 1. Patient was not administered IV t-PA secondary to ICH. [facility] and admitted to the neuro ICU for further evaluation and treatment.he was intubated and sedated pt had repeat Ct head which showed mildly increased size of hmg, and increased mass effect. Pt and was subsequently intubated with difficulty weaning, s/p trach. Developed LLE peroneal DVT s/p IVC filter placement given DVT risk in ICH patient. Transferred to CIR for ongoing PT, OT and ST. patient has finished his stay in rehabilitation and is currently home for last 7 weeks. He is not able to walk with a cane with mild left-sided weakness and drags his leg. His getting home physical and occupational therapy however this was recently placed on hold as his blood pressure was quite elevated. He has seen his primary care physician and blood pressure medications have been increased and it is doing much better and today his blood pressure is 134/82 in office. The patient's parents also concern about cognitive impairment. The fetus short-term memory is poor and  he often forgets himself and exhibits poor judgment. Patient can walk short distances by himself but slowly and does use cane for outdoor dose. He is currently living with his mother and grandmother. Patient has a physical job lifting boxes at the [employer]  and is clearly he presently disabled to go back to his job.     ROS:   14 system review of systems is positive for  restless leg, apnea, daytime sleepiness, memory loss, numbness, weakness, confusion, rash and all other systems negative  PMH:  Past Medical History:  Diagnosis Date  . CVA (cerebral vascular accident) (HCC)   . DVT (deep venous thrombosis) (HCC)   . Fracture of left ankle    treated with cast  . Hypertension     Social History:  Social History   Social History  . Marital status: Single    Spouse name: N/A  . Number of children: N/A  . Years of education: N/A   Occupational History  . Not on file.   Social History Main Topics  . Smoking status: Never Smoker  . Smokeless tobacco: Never Used  . Alcohol use No  . Drug use: No  . Sexual activity: No   Other Topics Concern  . Not on file   Social History Narrative  . No narrative on file    Medications:   Current Outpatient Prescriptions on File Prior to Visit  Medication Sig Dispense Refill  . amLODipine (NORVASC) 5 MG tablet Take 1 tablet by mouth 2 (two) times daily.    . famotidine (PEPCID) 40 MG tablet  Take 1 tablet (40 mg total) by mouth daily. 30 tablet 0  . hydrochlorothiazide (HYDRODIURIL) 12.5 MG tablet Take 1 tablet by mouth daily.    Marland Kitchen lisinopril (PRINIVIL,ZESTRIL) 20 MG tablet Take 1 tablet (20 mg total) by mouth 2 (two) times daily. 60 tablet 0  . Multiple Vitamin (MULTIVITAMIN WITH MINERALS) TABS tablet Take 1 tablet by mouth daily. 100 tablet 0   No current facility-administered medications on file prior to visit.     Allergies:   Allergies  Allergen Reactions  . Bee Venom Anaphylaxis  . Coconut Oil Anaphylaxis  . Ancef  [Cefazolin] Rash    Physical Exam General: Obese young African-American male, seated, in no evident distress Head: head normocephalic and atraumatic.  Neck: supple with no carotid or supraclavicular bruits Cardiovascular: regular rate and rhythm, no murmurs Musculoskeletal: no deformity Skin:  no rash/petichiae Vascular:  Normal pulses all extremities Vitals:   11/13/15 1448  BP: 134/82  Pulse: 100   Neurologic Exam Mental Status: Awake and fully alert. Oriented to place and time. Recent and remote memory intact. Attention span, concentration and fund of knowledge appropriate. Mood and affect appropriate.  Cranial Nerves: Fundoscopic exam reveals sharp disc margins. Pupils equal, briskly reactive to light. Extraocular movements full without nystagmus. Visual fields full to confrontation. Hearing intact. Facial sensation intact. Mild left lower facial weakness., palate moves normally and symmetrically.  Motor: Normal bulk and tone. Normal strength in all tested extremity muscles On the right side. Mild left hemiplegia with left upper and lower extremity drift. Weakness of left grip and intrinsic hand muscles. Orbits right over left upper extremity. Mild weakness of left hip results and ankle dorsiflexors.. Sensory.: intact to touch ,pinprick .position and vibratory sensation.  Coordination: Rapid alternating movements normal in all extremities. Finger-to-nose and heel-to-shin performed accurately bilaterally. Gait and Station: Arises from chair without difficulty. Stance is normal. Gait demonstrates slight dragging of the left leg and uses a cane.  Reflexes: 2+ and asymmetric brisker on the left. Toes downgoing.   NIHSS  3 Modified Rankin  3   ASSESSMENT: 31 year Male with right basal ganglia hematoma likely secondary to malignant hypertension. He has residual mild left hemiplegia but is eyt significantly disabled. Family concerned about mild cognitive impairment poststroke but I  anticipate this will improve with time    PLAN: I had a long d/w patient and his parents about his recent hemorrhagic stroke, risk for recurrent stroke/TIAs, personally independently reviewed imaging studies and stroke evaluation results and answered questions. Start  aspirin 81 mg daily  for secondary stroke prevention and maintain strict control of hypertension with blood pressure goal below 130/90, diabetes with hemoglobin A1c goal below 6.5% and lipids with LDL cholesterol goal below 70 mg/dL. I also advised the patient to eat a healthy diet with plenty of whole grains, cereals, fruits and vegetables, exercise regularly and maintain ideal body weight .He was encouraged to use a cane while walking outdoors and since he is clearly disabled he was advised not to return to work or drive till next follow-up visit in 3 months Followup in the future with me in 3 months or call earlier if necessar Greater than 50% of time during this 25 minute visit was spent on counseling,explanation of diagnosis, planning of further management, discussion with patient and family and coordination of care Delia Heady, MD  [facility] [address] [place], [place] [address]  Phone [phone] Fax [phone] Note: This document was prepared with digital dictation  and possible smart Company secretary. Any transcriptional errors that result from this process are unintentional

## 2015-11-13 NOTE — Patient Instructions (Signed)
I had a long d/w patient and his parents about his recent hemorrhagic stroke, risk for recurrent stroke/TIAs, personally independently reviewed imaging studies and stroke evaluation results and answered questions. Start  aspirin 81 mg daily  for secondary stroke prevention and maintain strict control of hypertension with blood pressure goal below 130/90, diabetes with hemoglobin A1c goal below 6.5% and lipids with LDL cholesterol goal below 70 mg/dL. I also advised the patient to eat a healthy diet with plenty of whole grains, cereals, fruits and vegetables, exercise regularly and maintain ideal body weight .He was encouraged to use a cane while walking outdoors and since he is clearly disabled he was advised not to return to work or drive till next follow-up visit in 3 months Followup in the future with me in 3 months or call earlier if necessary

## 2015-11-14 ENCOUNTER — Ambulatory Visit: Payer: 59 | Admitting: Occupational Therapy

## 2015-11-14 ENCOUNTER — Ambulatory Visit: Payer: 59 | Admitting: Physical Therapy

## 2015-11-14 ENCOUNTER — Encounter: Payer: Self-pay | Admitting: Occupational Therapy

## 2015-11-14 ENCOUNTER — Telehealth: Payer: Self-pay | Admitting: Internal Medicine

## 2015-11-14 VITALS — BP 141/88

## 2015-11-14 VITALS — BP 138/90 | HR 87

## 2015-11-14 DIAGNOSIS — R2689 Other abnormalities of gait and mobility: Secondary | ICD-10-CM

## 2015-11-14 DIAGNOSIS — M6281 Muscle weakness (generalized): Secondary | ICD-10-CM

## 2015-11-14 DIAGNOSIS — R2681 Unsteadiness on feet: Secondary | ICD-10-CM | POA: Diagnosis not present

## 2015-11-14 DIAGNOSIS — R278 Other lack of coordination: Secondary | ICD-10-CM

## 2015-11-14 DIAGNOSIS — Z0289 Encounter for other administrative examinations: Secondary | ICD-10-CM

## 2015-11-14 DIAGNOSIS — G8191 Hemiplegia, unspecified affecting right dominant side: Secondary | ICD-10-CM

## 2015-11-14 NOTE — Therapy (Signed)
[facility] [address] [place], [place], [address] Phone: [phone]   Fax:  [phone]  Occupational Therapy Treatment  Patient Details  Name: Brad MaxwellShawon M Leisey MRN: [id number] Date of Birth: 06/11/1985 Referring Provider: Dr. Marcello FennelAnkit Anil Patel  Encounter Date: 11/14/2015      OT End of Session - 11/14/15 1211    Visit Number 5   Number of Visits 8   Date for OT Re-Evaluation 11/14/15   Authorization Type UHC   Authorization Time Period 60 visit limit for OT   OT Start Time 0847   OT Stop Time 0930   OT Time Calculation (min) 43 min   Activity Tolerance Patient tolerated treatment well      Past Medical History:  Diagnosis Date  . CVA (cerebral vascular accident) (HCC)   . DVT (deep venous thrombosis) (HCC)   . Fracture of left ankle    treated with cast  . Hypertension     Past Surgical History:  Procedure Laterality Date  . greenfield filter      Vitals:   11/14/15 0856  BP: 138/90  Pulse: 87        Subjective Assessment - 11/14/15 0851    Subjective  I just want to get back to my own home.    Pertinent History see epic snapshot, R BG CVA , HTN.Marland Kitchen. CHECK BP!!!   Patient Stated Goals Strengthen my leg and work on my left arm   Currently in Pain? No/denies                      OT Treatments/Exercises (OP) - 11/14/15 0001      Exercises   Exercises Shoulder     Shoulder Exercises: Seated   Other Seated Exercises Pt issued theraband execise program (green band) for HEP to assist with LUE proximal strength.  Exercises focused on shoulder flexion, abduction and bicep curls. Pt able to complete 12 reps x2 of all exercises. Pt's  mother present and able to cue pt prn. Pt given hand out. Pt does need vc's for form and benefits from mirror due to altered sensation.  Will review one more time next session with pt.                 OT Education - 11/14/15 1210    Education  provided Yes   Education Details HEP for LUE proximal strength   Person(s) Educated Patient;Parent(s)   Methods Explanation;Demonstration;Verbal cues;Tactile cues;Handout   Comprehension Verbalized understanding;Returned demonstration;Need further instruction  needs reinforcement- mom to cue prn at home             OT Long Term Goals - 11/14/15 1210      OT LONG TERM GOAL #1   Title Pt will be mod I with HEP - 11/27/2015 (date  adjusted as pt has been on hold for 2 weeks)   Status On-going     OT LONG TERM GOAL #2   Title Pt will demonstrate improved coordination as evidenced by decreasing time on 9 hole peg test by at least 15 seconds (baseline= 1.02.59)   Status On-going     OT LONG TERM GOAL #3   Title Pt demonstrate 5/5 proximal shoulder strenghth in LUE to assist with functional tasks   Status Achieved               Plan - 11/14/15 1211    Clinical Impression Statement Pt progresssing toward goals. Pt with much  better BP contol.     Rehab Potential Good   Clinical Impairments Affecting Rehab Potential uncontrolled BP - pt and pt's father  instructed to follow up today for high BP   OT Frequency 2x / week   OT Duration 4 weeks   OT Treatment/Interventions Self-care/ADL training;Neuromuscular education;Therapeutic exercise;Manual Therapy;Therapeutic activities;Patient/family education   Plan check 9 hole peg test, review HEP, d/c from OT   Consulted and Agree with Plan of Care Patient;Family member/caregiver   Family Member Consulted mom      Patient will benefit from skilled therapeutic intervention in order to improve the following deficits and impairments:  Cardiopulmonary status limiting activity, Decreased coordination, Decreased strength, Impaired UE functional use, Obesity  Visit Diagnosis: Muscle weakness (generalized)  Hemiplegia, unspecified affecting right dominant side (HCC)  Other lack of coordination    Problem List Patient Active Problem  List   Diagnosis Date Noted  . GERD (gastroesophageal reflux disease) 11/09/2015  . Deep venous thrombosis (DVT) of left peroneal vein (HCC) 09/01/2015  . Right Basal ganglia hemorrhage (HCC)   . Accelerated hypertension 08/16/2015    Norton Pastel, OTR/L 11/14/2015, 12:14 PM  [facility] [address] [place], [place], [address] Phone: [phone]   Fax:  [phone]  Name: Brad Mcgee MRN: [id number] Date of Birth: Nov 13, 1985

## 2015-11-14 NOTE — Telephone Encounter (Signed)
Tried calling pt no voice mail Have questions about fmla paperwork

## 2015-11-14 NOTE — Telephone Encounter (Signed)
Done given back to robin 

## 2015-11-14 NOTE — Telephone Encounter (Signed)
Paperwork faxed to [employer] Copy for pt Copy for file Copy for scan  Pt aware

## 2015-11-14 NOTE — Therapy (Signed)
[facility] [address] [place], [place], [address] Phone: [phone]   Fax:  [phone]  Physical Therapy Treatment  Patient Details  Name: Brad Mcgee MRN: [id number] Date of Birth: 07/08/1985 Referring Provider: Dr. Maryla MorrowAnkit Patel  Encounter Date: 11/14/2015      PT End of Session - 11/15/15 1710    Visit Number 5   Number of Visits 9   Date for PT Re-Evaluation 11/16/15   Authorization Type UHC   Authorization - Visit Number 4   Authorization - Number of Visits 60   PT Start Time 0934   PT Stop Time 1020   PT Time Calculation (min) 46 min      Past Medical History:  Diagnosis Date  . CVA (cerebral vascular accident) (HCC)   . DVT (deep venous thrombosis) (HCC)   . Fracture of left ankle    treated with cast  . Hypertension     Past Surgical History:  Procedure Laterality Date  . greenfield filter      Vitals:   11/14/15 0937  BP: (!) 141/88        Subjective Assessment - 11/15/15 1706    Subjective Mom reports that Dr. Pearlean BrownieSethi said that pt's BP just needs to stay below 180/110 - pt has been taking medication and BP has been lower   Patient is accompained by: Family member   Patient Stated Goals To walk without cane and improve balance   Currently in Pain? No/denies     TherEx: L SLR 4# 10 reps Knee to chest 4# 10 reps  L hip extension prone 4# with knee flexed 10 reps L knee flexion prone 4# 10 reps    Leg press 50# bil. LE's 10 reps;  LLE only 35# 10 reps  Quadriped position - lifting opposite UE and LE - 3 reps each side with 5 sec hold attempted  L plantarflexion in closed chain position off side of hi/lo mat table  - 2 sets of 10 reps             [facility] Adult PT Treatment/Exercise - 11/15/15 0001      Lumbar Exercises: Supine   Bridge 5 reps     Knee/Hip Exercises: Sidelying   Hip ABduction AROM;Left;10 reps     Bridging with hip abduction/adduction, with marching  and with RLE extension - 5 reps each  L hip abduction with 4# weight 10 reps L hip abduction with ER 4# 10 reps              PT Long Term Goals - 10/31/15 1718      PT LONG TERM GOAL #1   Title Improve gait velocity to >/= 2.8 ft/sec without cane for incr. gait efficiency.  (11-16-15)   Status New     PT LONG TERM GOAL #2   Title Improve TUG score to </= 11.0 secs without device for incr. safety with mobility.  (11-16-15)   Status New     PT LONG TERM GOAL #3   Title Incr. Berg score to >/= 53/56 for incr. safety with leisure activities and for decr. fall risk. (11-16-15)   Status New     PT LONG TERM GOAL #4   Title Complete DGI and establish goal as appropriate.  (11-16-15)  - Incr. DGI score to >/= 22/24 to reduce fall risk.  (established 10-31-15 by SD)      PT LONG TERM GOAL #5   Title Amb. 1000' on  all surfaces without device modified independently. (11-16-15)   Status New     PT LONG TERM GOAL #6   Title Independent in HEP for balance and strengthening.  (11-16-15)   Status New               Plan - 11/15/15 1711    Clinical Impression Statement Pt progressing well with increasing LLE strength - tolerated strengthening exercises well with use of weight for resistance; pt has decr. strength in L plantarflexors which contribute to gait deviation of decr. push off in stance   Rehab Potential Good   PT Frequency 2x / week   PT Duration 4 weeks   PT Treatment/Interventions ADLs/Self Care Home Management;Gait training;Stair training;Functional mobility training;Therapeutic activities;Patient/family education;Neuromuscular re-education;Balance training;Therapeutic exercise   PT Next Visit Plan cont LLE strengthening   PT Home Exercise Plan see above   Family Member Consulted mother      Patient will benefit from skilled therapeutic intervention in order to improve the following deficits and impairments:  Abnormal gait, Decreased balance, Decreased endurance,  Decreased activity tolerance, Decreased coordination, Decreased strength  Visit Diagnosis: Other abnormalities of gait and mobility  Muscle weakness (generalized)     Problem List Patient Active Problem List   Diagnosis Date Noted  . GERD (gastroesophageal reflux disease) 11/09/2015  . Deep venous thrombosis (DVT) of left peroneal vein (HCC) 09/01/2015  . Right Basal ganglia hemorrhage (HCC)   . Accelerated hypertension 08/16/2015  . Essential (primary) hypertension 08/16/2015    Latima Hamza, [name], PT 11/15/2015, 5:15 PM  [facility] [address] [place], [place], [address] Phone: [phone]   Fax:  [phone]  Name: Brad Mcgee MRN: [id number] Date of Birth: 08/16/1985

## 2015-11-14 NOTE — Telephone Encounter (Signed)
Spoke with pt He stated [facility]15 has not went back to work yet  [employer] paperwork in [employer] IN BOX For review and signature

## 2015-11-14 NOTE — Patient Instructions (Signed)
Pt issued theraband hand out (green band). Shoulder flexion, abduction and bicep curl.

## 2015-11-16 ENCOUNTER — Ambulatory Visit: Payer: 59 | Admitting: Physical Therapy

## 2015-11-16 ENCOUNTER — Encounter: Payer: Self-pay | Admitting: Occupational Therapy

## 2015-11-16 ENCOUNTER — Ambulatory Visit: Payer: 59 | Admitting: Occupational Therapy

## 2015-11-16 VITALS — BP 129/89 | HR 81

## 2015-11-16 DIAGNOSIS — M6281 Muscle weakness (generalized): Secondary | ICD-10-CM

## 2015-11-16 DIAGNOSIS — R2689 Other abnormalities of gait and mobility: Secondary | ICD-10-CM

## 2015-11-16 DIAGNOSIS — R2681 Unsteadiness on feet: Secondary | ICD-10-CM | POA: Diagnosis not present

## 2015-11-16 NOTE — Patient Instructions (Signed)
Updates as of TODAY: - Do all these exercises with BOTH arms.so that your left arm doesn't become stronger than your right. You can do them separately so do the left arm first and then the right arm. Rest as you need to.  This will also help your endurance.!!  How to upgrade your home program in the future for your left arm:  :Remember that the shorter the band the more resistance the longer the band the less resistance. Always use a mirror as that helps you with your form.    1. Increase reps (can go from 12 to 15 to 18 to 20 OVER TIME!!!!  2. You can "hold" at the end range for a slow count of 5-10   3.  The slower you do the exercise the more the arm has to work.  4. Eventually you can use the blue if you want to.  Start with just one exercise with the blue and the others with the green. SLOWLY transition over several weeks to using the blue for all.  You may need to back down your reps to 10 when you start with the blue.  Do not change more than one thing at a time.  Once your body adjusts to the first change, you can change something else.

## 2015-11-16 NOTE — Therapy (Signed)
[facility] [address] [place], [place], [address] Phone: [phone]   Fax:  [phone]  Occupational Therapy Treatment  Patient Details  Name: Brad Mcgee MRN: [id number] Date of Birth: 11-22-1985 Referring Provider: Dr. Jamse Arn  Encounter Date: 11/16/2015      OT End of Session - 11/16/15 1154    Visit Number 6   Number of Visits 8   Date for OT Re-Evaluation 11/14/15   Authorization Type UHC   Authorization Time Period 60 visit limit for OT   OT Start Time 0845   OT Stop Time 0929   OT Time Calculation (min) 44 min   Activity Tolerance Patient tolerated treatment well      Past Medical History:  Diagnosis Date  . CVA (cerebral vascular accident) ([place])   . DVT (deep venous thrombosis) ([place])   . Fracture of left ankle    treated with cast  . Hypertension     Past Surgical History:  Procedure Laterality Date  . greenfield filter      Vitals:   11/16/15 0853  BP: 129/89  Pulse: 81        Subjective Assessment - 11/16/15 0856    Subjective  I did my new arm exercises yesterday and they went pretty well   Patient is accompained by: Family member  gma   Pertinent History see epic snapshot, R BG CVA , HTN.Marland Kitchen CHECK BP!!!   Patient Stated Goals Strengthen my leg and work on my left arm   Currently in Pain? No/denies                      OT Treatments/Exercises (OP) - 11/16/15 0001      Exercises   Exercises Shoulder     Shoulder Exercises: Seated   Other Seated Exercises reviewed all UE theraband exercises and pt only needed two vc's. Pt requested mirror independently to assist with form. Pt also instructed in numerous ways to upgrade program as he progresses - see pt instruction section for details. Pt able to verbalize understanding as well as grandmother and given to pt in writing.                 OT Education - 11/16/15 1153    Education provided Yes   Education Details how to upgrade UE HEP as pt progresses   Person(s) Educated Patient;Other (comment)  gma   Comprehension Verbalized understanding;Returned demonstration             OT Long Term Goals - 11/16/15 1153      OT LONG TERM GOAL #1   Title Pt will be mod I with HEP - 11/27/2015 (date  adjusted as pt has been on hold for 2 weeks)   Status Achieved     OT LONG TERM GOAL #2   Title Pt will demonstrate improved coordination as evidenced by decreasing time on 9 hole peg test by at least 15 seconds (baseline= 1.02.59)   Status Achieved  32.22     OT LONG TERM GOAL #3   Title Pt demonstrate 5/5 proximal shoulder strenghth in LUE to assist with functional tasks   Status Achieved               Plan - 11/16/15 1153    Clinical Impression Statement Pt has met all goals and is ready for discharge from OT   Rehab Potential Good   Clinical Impairments Affecting Rehab Potential uncontrolled BP -  pt and pt's father  instructed to follow up today for high BP   OT Frequency 2x / week   OT Duration 4 weeks   OT Treatment/Interventions Self-care/ADL training;Neuromuscular education;Therapeutic exercise;Manual Therapy;Therapeutic activities;Patient/family education   Plan d/c from OT   Consulted and Agree with Plan of Care Patient;Family member/caregiver   Family Member Consulted gma      Patient will benefit from skilled therapeutic intervention in order to improve the following deficits and impairments:  Cardiopulmonary status limiting activity, Decreased coordination, Decreased strength, Impaired UE functional use, Obesity  Visit Diagnosis: Muscle weakness (generalized)    Problem List Patient Active Problem List   Diagnosis Date Noted  . GERD (gastroesophageal reflux disease) 11/09/2015  . Deep venous thrombosis (DVT) of left peroneal vein ([place]) 09/01/2015  . Right Basal ganglia hemorrhage ([place])   . Accelerated hypertension 08/16/2015  . Essential (primary)  hypertension 08/16/2015   OCCUPATIONAL THERAPY DISCHARGE SUMMARY  Visits from Start of Care: 6  Current functional level related to goals / functional outcomes: See above   Remaining deficits: Balance (PT addressing)   Education / Equipment: HEP  Plan: Patient agrees to discharge.  Patient goals were met. Patient is being discharged due to meeting the stated rehab goals.  ?????     Quay Burow, OTR/L 11/16/2015, 11:56 AM  [facility] [address] [place], [place], [address] Phone: [phone]   Fax:  [phone]  Name: TORRIAN CANION MRN: [id number] Date of Birth: September 24, 1985

## 2015-11-16 NOTE — Therapy (Signed)
[facility] [address] [place], [place], [address] Phone: [phone]   Fax:  [phone]  Physical Therapy Treatment  Patient Details  Name: Brad MaxwellShawon M Plaia MRN: [id number] Date of Birth: 08/14/1985 Referring Provider: Dr. Maryla MorrowAnkit Patel  Encounter Date: 11/16/2015      PT End of Session - 11/16/15 1750    Visit Number 6   Number of Visits 9   Date for PT Re-Evaluation 11/16/15   Authorization Type UHC   Authorization - Visit Number 5   Authorization - Number of Visits 60   PT Start Time 0933   PT Stop Time 1015   PT Time Calculation (min) 42 min      Past Medical History:  Diagnosis Date  . CVA (cerebral vascular accident) (HCC)   . DVT (deep venous thrombosis) (HCC)   . Fracture of left ankle    treated with cast  . Hypertension     Past Surgical History:  Procedure Laterality Date  . greenfield filter      There were no vitals filed for this visit.      Subjective Assessment - 11/16/15 1744    Subjective Pt reports no problems or changes since last visit   Patient is accompained by: Family member  grandmother   Patient Stated Goals To walk without cane and improve balance   Currently in Pain? No/denies                         [facility] Adult PT Treatment/Exercise - 11/16/15 1744      Ambulation/Gait   Ambulation/Gait Yes   Ambulation/Gait Assistance 5: Supervision   Ambulation/Gait Assistance Details verbal cues to internall rotate LLE and incr. push off in stance phase of gait   Ambulation Distance (Feet) 100 Feet   Assistive device None   Gait Pattern Step-through pattern   Ambulation Surface Level;Indoor     Exercises   Exercises Lumbar     Lumbar Exercises: Machines for Strengthening   Leg Press 60# 10 reps bil. LE's:  LLE only 35# 15 reps     Neuro Re-ed; standing on BOSU - moving RLE for incr. LLE SLS and balance - cues to incr. Knee flexion on LLE for  quad Strengthening Squats on Bosu x 10 reps with minimal UE support Amb. On tip toes inside bars forward and back 20' x 2 reps Amb. Sideways on tip toes for plantarflexion strengthening 10' x 4 reps with UE support prn   TherEx: step ups LLE to 6" step 10 reps;  Step down from 4" step for L eccentric quad strengthening x 10 reps with UE support prn              PT Long Term Goals - 10/31/15 1718      PT LONG TERM GOAL #1   Title Improve gait velocity to >/= 2.8 ft/sec without cane for incr. gait efficiency.  (11-16-15)   Status New     PT LONG TERM GOAL #2   Title Improve TUG score to </= 11.0 secs without device for incr. safety with mobility.  (11-16-15)   Status New     PT LONG TERM GOAL #3   Title Incr. Berg score to >/= 53/56 for incr. safety with leisure activities and for decr. fall risk. (11-16-15)   Status New     PT LONG TERM GOAL #4   Title Complete DGI and establish goal as appropriate.  (11-16-15)  -  Incr. DGI score to >/= 22/24 to reduce fall risk.  (established 10-31-15 by SD)      PT LONG TERM GOAL #5   Title Amb. 1000' on all surfaces without device modified independently. (11-16-15)   Status New     PT LONG TERM GOAL #6   Title Independent in HEP for balance and strengthening.  (11-16-15)   Status New               Plan - 11/16/15 1751    Clinical Impression Statement Gait pattern improved with cues to increase heel contact and push off with increased LLE internal rotation of hip.  Balance is improving with L SLS   Rehab Potential Good   PT Frequency 2x / week   PT Duration 4 weeks   PT Treatment/Interventions ADLs/Self Care Home Management;Gait training;Stair training;Functional mobility training;Therapeutic activities;Patient/family education;Neuromuscular re-education;Balance training;Therapeutic exercise   PT Next Visit Plan cont LLE strengthening   Consulted and Agree with Plan of Care Patient;Family member/caregiver   Family Member  Consulted grandmother      Patient will benefit from skilled therapeutic intervention in order to improve the following deficits and impairments:  Abnormal gait, Decreased balance, Decreased endurance, Decreased activity tolerance, Decreased coordination, Decreased strength  Visit Diagnosis: Muscle weakness (generalized)  Other abnormalities of gait and mobility     Problem List Patient Active Problem List   Diagnosis Date Noted  . GERD (gastroesophageal reflux disease) 11/09/2015  . Deep venous thrombosis (DVT) of left peroneal vein (HCC) 09/01/2015  . Right Basal ganglia hemorrhage (HCC)   . Accelerated hypertension 08/16/2015  . Essential (primary) hypertension 08/16/2015    DildayDonavan Burnet, PT 11/16/2015, 5:54 PM  [facility] [address] [place], [place], [address] Phone: [phone]   Fax:  [phone]  Name: Brad Mcgee MRN: [id number] Date of Birth: 31-Mar-1985

## 2015-11-17 ENCOUNTER — Telehealth: Payer: Self-pay

## 2015-11-17 NOTE — Telephone Encounter (Signed)
[employer] for disability form fax to [phone]. Forms were fax twice and receive.

## 2015-11-17 NOTE — Telephone Encounter (Signed)
LFt vm for patients mom that patients [employer] form for STD and fax to company. Pts PCP filled out the form when he was discharge from hospital. Pt did pay the 50.00 on 11/13/2015 for fee of forms.

## 2015-11-21 ENCOUNTER — Ambulatory Visit: Payer: 59 | Attending: Physical Medicine & Rehabilitation | Admitting: Physical Therapy

## 2015-11-21 ENCOUNTER — Telehealth: Payer: Self-pay | Admitting: Neurology

## 2015-11-21 DIAGNOSIS — R2689 Other abnormalities of gait and mobility: Secondary | ICD-10-CM | POA: Diagnosis not present

## 2015-11-21 DIAGNOSIS — G8191 Hemiplegia, unspecified affecting right dominant side: Secondary | ICD-10-CM | POA: Diagnosis present

## 2015-11-21 DIAGNOSIS — M6281 Muscle weakness (generalized): Secondary | ICD-10-CM

## 2015-11-21 NOTE — Telephone Encounter (Signed)
Dr.Sethi FYI. 

## 2015-11-21 NOTE — Telephone Encounter (Signed)
[facility] ask if he spoke with his therapist at [facility]. Pt stated his therapist stated [facility]. Rn stated if his PT approve the [facility] exercise, he should follow their plan, and be safe. Pt verbalized understand.

## 2015-11-21 NOTE — Telephone Encounter (Signed)
Mom Remo LippsSheridan returned Katrina's call. Advised Sheridan per Katrina, form is ready up front for pick up.

## 2015-11-21 NOTE — Telephone Encounter (Signed)
Pt called inquiring if he could do bike [facility]th [facility]?

## 2015-11-21 NOTE — Telephone Encounter (Signed)
Thanks. Agree with plan 

## 2015-11-23 ENCOUNTER — Ambulatory Visit: Payer: 59 | Admitting: Physical Therapy

## 2015-11-23 VITALS — BP 116/84

## 2015-11-23 DIAGNOSIS — R2689 Other abnormalities of gait and mobility: Secondary | ICD-10-CM | POA: Diagnosis not present

## 2015-11-23 DIAGNOSIS — M6281 Muscle weakness (generalized): Secondary | ICD-10-CM

## 2015-11-23 NOTE — Therapy (Signed)
[facility] [address] [place], [place], [address] Phone: [phone]   Fax:  [phone]  Physical Therapy Treatment  Patient Details  Name: Brad Mcgee MRN: [id number] Date of Birth: 05/15/1985 Referring Provider: Dr. Maryla MorrowAnkit Patel  Encounter Date: 11/21/2015      PT End of Session - 11/23/15 1048    Visit Number 7   Number of Visits 9   Date for PT Re-Evaluation 11/16/15   Authorization Type UHC   Authorization - Visit Number 6   Authorization - Number of Visits 60   PT Start Time 0932   PT Stop Time 1016   PT Time Calculation (min) 44 min      Past Medical History:  Diagnosis Date  . CVA (cerebral vascular accident) (HCC)   . DVT (deep venous thrombosis) (HCC)   . Fracture of left ankle    treated with cast  . Hypertension     Past Surgical History:  Procedure Laterality Date  . greenfield filter      There were no vitals filed for this visit.      Subjective Assessment - 11/23/15 1044    Subjective Pt reports no problems or changes - wants to go to gym to start working out but his dad wants to get permission from his MD first   Patient is accompained by: Family member   Patient Stated Goals To walk without cane and improve balance   Currently in Pain? No/denies                         [facility] Adult PT Treatment/Exercise - 11/23/15 0001      Ambulation/Gait   Ambulation/Gait Yes   Ambulation/Gait Assistance 5: Supervision   Ambulation Distance (Feet) 400 Feet   Assistive device None   Gait Pattern Step-through pattern   Ambulation Surface Level;Unlevel;Indoor;Outdoor;Paved;Grass   Curb 5: Supervision     Lumbar Exercises: Machines for Strengthening   Leg Press 60# 10 reps bil. LE's:  LLE only 40# 15 reps     Lumbar Exercises: Standing   Heel Raises 10 reps   Functional Squats 10 reps  on BOSU     Pt gait trained outside approx. 300' on paved incline and decline and  on grassy hill - with no device with SBA; pt had no LOB- Discontinued use of device after today's PT session  TherAct; practiced jumping with bil. LE's - 5 reps inside bars for UE support prn; 5 reps LLE single limb hopping 1/2 jumping jacks on floor x 5 reps;  Lunges on floor - alternating each foot up/back x 5 reps each - no LOB occurred   Neuro Re-ed:   Quick marching in place with opposite elbow to knee for incr. Coordination  Standing on BOSU - LLE SLS - moving RLE up/back and out to side x 10 reps each direction LLE partial squats for improved LLE control and SLS                 PT Long Term Goals - 10/31/15 1718      PT LONG TERM GOAL #1   Title Improve gait velocity to >/= 2.8 ft/sec without cane for incr. gait efficiency.  (11-16-15)   Status New     PT LONG TERM GOAL #2   Title Improve TUG score to </= 11.0 secs without device for incr. safety with mobility.  (11-16-15)   Status New  PT LONG TERM GOAL #3   Title Incr. Berg score to >/= 53/56 for incr. safety with leisure activities and for decr. fall risk. (11-16-15)   Status New     PT LONG TERM GOAL #4   Title Complete DGI and establish goal as appropriate.  (11-16-15)  - Incr. DGI score to >/= 22/24 to reduce fall risk.  (established 10-31-15 by SD)      PT LONG TERM GOAL #5   Title Amb. 1000' on all surfaces without device modified independently. (11-16-15)   Status New     PT LONG TERM GOAL #6   Title Independent in HEP for balance and strengthening.  (11-16-15)   Status New               Plan - 11/23/15 1048    Clinical Impression Statement Pt's gait and balance have significantly improved so pt is able to ambulate without use of SBQC on all surfaces - no device needed as of current time   Rehab Potential Good   PT Frequency 2x / week   PT Duration 4 weeks   PT Treatment/Interventions ADLs/Self Care Home Management;Gait training;Stair training;Functional mobility training;Therapeutic  activities;Patient/family education;Neuromuscular re-education;Balance training;Therapeutic exercise   PT Next Visit Plan cont LLE strengthening   PT Home Exercise Plan see above   Consulted and Agree with Plan of Care Patient;Family member/caregiver   Family Member Consulted sister      Patient will benefit from skilled therapeutic intervention in order to improve the following deficits and impairments:  Abnormal gait, Decreased balance, Decreased endurance, Decreased activity tolerance, Decreased coordination, Decreased strength  Visit Diagnosis: Other abnormalities of gait and mobility  Muscle weakness (generalized)     Problem List Patient Active Problem List   Diagnosis Date Noted  . GERD (gastroesophageal reflux disease) 11/09/2015  . Deep venous thrombosis (DVT) of left peroneal vein (HCC) 09/01/2015  . Right Basal ganglia hemorrhage (HCC)   . Accelerated hypertension 08/16/2015  . Essential (primary) hypertension 08/16/2015    DildayDonavan Burnet, PT 11/23/2015, 10:59 AM  [facility] [address] [place], [place], [address] Phone: [phone]   Fax:  [phone]  Name: Brad Mcgee MRN: [id number] Date of Birth: 02-18-1986

## 2015-11-23 NOTE — Therapy (Signed)
[facility] [address] [place], [place], [address] Phone: [phone]   Fax:  [phone]  Physical Therapy Treatment  Patient Details  Name: Brad Mcgee MRN: [id number] Date of Birth: 1985-10-07 Referring Provider: Dr. Maryla Morrow  Encounter Date: 11/23/2015      PT End of Session - 11/26/15 2033    Visit Number 8   Number of Visits 9   Date for PT Re-Evaluation 11/16/15   Authorization Type UHC   Authorization - Visit Number 7   Authorization - Number of Visits 60   PT Start Time 0804   PT Stop Time 0850   PT Time Calculation (min) 46 min      Past Medical History:  Diagnosis Date  . CVA (cerebral vascular accident) (HCC)   . DVT (deep venous thrombosis) (HCC)   . Fracture of left ankle    treated with cast  . Hypertension     Past Surgical History:  Procedure Laterality Date  . greenfield filter      Vitals:   11/23/15 0837  BP: 116/84        Subjective Assessment - 11/26/15 2031    Subjective Pt not using cane - has not had any LOB - "doing fine"   Patient is accompained by: Family member   Patient Stated Goals To walk without cane and improve balance   Currently in Pain? No/denies       TherEx;  Bil. Heel raises 10 reps               LLE only heel raise 10 reps      NeuroRe-ed:  1/2 jumping jacks x 10 reps for improved coordination Lunges - alternating lead leg x 5 reps each Jumping up/back and then laterally -over yardstick on floor - 5 reps each  Standing on BOSU - moving RLE up/back and laterally x 5 reps each direction  Squats x 10 reps for improved core stabilization - with UE support prn         [facility] Adult PT Treatment/Exercise - 11/26/15 0001      Lumbar Exercises: Stretches   Active Hamstring Stretch 1 rep;30 seconds  LLE     Lumbar Exercises: Aerobic   Elliptical 1" forward     Lumbar Exercises: Machines for Strengthening   Leg Press 60# 10 reps bil.  LE's:  LLE only 40# 15 reps     Lumbar Exercises: Standing   Heel Raises 10 reps   Functional Squats 10 reps  on BOSU                     PT Long Term Goals - 10/31/15 1718      PT LONG TERM GOAL #1   Title Improve gait velocity to >/= 2.8 ft/sec without cane for incr. gait efficiency.  (11-16-15)   Status New     PT LONG TERM GOAL #2   Title Improve TUG score to </= 11.0 secs without device for incr. safety with mobility.  (11-16-15)   Status New     PT LONG TERM GOAL #3   Title Incr. Berg score to >/= 53/56 for incr. safety with leisure activities and for decr. fall risk. (11-16-15)   Status New     PT LONG TERM GOAL #4   Title Complete DGI and establish goal as appropriate.  (11-16-15)  - Incr. DGI score to >/= 22/24 to reduce fall risk.  (established 10-31-15 by SD)  PT LONG TERM GOAL #5   Title Amb. 1000' on all surfaces without device modified independently. (11-16-15)   Status New     PT LONG TERM GOAL #6   Title Independent in HEP for balance and strengthening.  (11-16-15)   Status New               Plan - 11/26/15 2034    Clinical Impression Statement Pt progressing well towards goals - continues to have gait deviations including LLE externally rotated in stance and decreased L arm swing in gait   Rehab Potential Good   PT Frequency 2x / week   PT Duration 4 weeks   PT Treatment/Interventions ADLs/Self Care Home Management;Gait training;Stair training;Functional mobility training;Therapeutic activities;Patient/family education;Neuromuscular re-education;Balance training;Therapeutic exercise   PT Next Visit Plan cont LLE strengthening   PT Home Exercise Plan see above   Consulted and Agree with Plan of Care Patient;Family member/caregiver   Family Member Consulted mother      Patient will benefit from skilled therapeutic intervention in order to improve the following deficits and impairments:  Abnormal gait, Decreased balance, Decreased  endurance, Decreased activity tolerance, Decreased coordination, Decreased strength  Visit Diagnosis: Other abnormalities of gait and mobility  Muscle weakness (generalized)     Problem List Patient Active Problem List   Diagnosis Date Noted  . GERD (gastroesophageal reflux disease) 11/09/2015  . Deep venous thrombosis (DVT) of left peroneal vein (HCC) 09/01/2015  . Right Basal ganglia hemorrhage (HCC)   . Accelerated hypertension 08/16/2015  . Essential (primary) hypertension 08/16/2015    DildayDonavan Burnet, Lindey Renzulli Suzanne, PT 11/26/2015, 8:37 PM  [facility] [address] [place], [place], [address] Phone: [phone]   Fax:  [phone]  Name: Brad Mcgee MRN: [id number] Date of Birth: 04/07/1985

## 2015-11-24 ENCOUNTER — Telehealth: Payer: Self-pay | Admitting: *Deleted

## 2015-11-24 NOTE — Telephone Encounter (Signed)
Pt form faxed to the [employer] on 11/24/15. # [phone]

## 2015-11-27 ENCOUNTER — Telehealth: Payer: Self-pay | Admitting: Neurology

## 2015-11-27 NOTE — Telephone Encounter (Signed)
I advised him not to drive till his next follow-up visit with me.

## 2015-11-27 NOTE — Telephone Encounter (Signed)
Patient is calling to ask if Dr. Allena KatzPatel would be able to release him from not driving or does Dr. Pearlean BrownieSethi have to do that? Please call and discuss.

## 2015-11-28 ENCOUNTER — Ambulatory Visit: Payer: 59 | Admitting: Physical Therapy

## 2015-11-28 DIAGNOSIS — R2689 Other abnormalities of gait and mobility: Secondary | ICD-10-CM

## 2015-11-28 DIAGNOSIS — G8191 Hemiplegia, unspecified affecting right dominant side: Secondary | ICD-10-CM

## 2015-11-28 DIAGNOSIS — M6281 Muscle weakness (generalized): Secondary | ICD-10-CM

## 2015-11-29 NOTE — Therapy (Signed)
[facility] [address] [place], [place], [address] Phone: [phone]   Fax:  [phone]  Physical Therapy Treatment  Patient Details  Name: Brad Mcgee MRN: [id number] Date of Birth: [birth year] Referring Provider: Dr. Delice Lesch  Encounter Date: 11/28/2015      PT End of Session - 11/29/15 1730    PT Start Time 0800   PT Stop Time 0845   PT Time Calculation (min) 45 min      Past Medical History:  Diagnosis Date  . CVA (cerebral vascular accident) ([place])   . DVT (deep venous thrombosis) ([place])   . Fracture of left ankle    treated with cast  . Hypertension     Past Surgical History:  Procedure Laterality Date  . greenfield filter      There were no vitals filed for this visit.      Subjective Assessment - 11/29/15 1721    Subjective Pt [place] race [place] - got really tired with standing around for approx. 4 hours - states "I didn't know we were going to be there that long"   Patient is accompained by: Family member  mother   Patient Stated Goals To walk without cane and improve balance   Currently in Pain? No/denies            [facility] PT Assessment - 11/29/15 0001      Assessment   Medical Diagnosis R basal ganglia CVA   Referring Provider Dr. Delice Lesch   Onset Date/Surgical Date 08/15/15     Merrilee Jansky Balance Test   Sit to Stand Able to stand without using hands and stabilize independently   Standing Unsupported Able to stand safely 2 minutes   Sitting with Back Unsupported but Feet Supported on Floor or Stool Able to sit safely and securely 2 minutes   Stand to Sit Sits safely with minimal use of hands   Transfers Able to transfer safely, minor use of hands   Standing Unsupported with Eyes Closed Able to stand 10 seconds safely   Standing Ubsupported with Feet Together Able to place feet together independently and stand 1 minute safely   From Standing, Reach  Forward with Outstretched Arm Can reach confidently >25 cm (10")   From Standing Position, Pick up Object from Floor Able to pick up shoe safely and easily   From Standing Position, Turn to Look Behind Over each Shoulder Looks behind from both sides and weight shifts well   Turn 360 Degrees Able to turn 360 degrees safely in 4 seconds or less  R=2.9  L=3.75   Standing Unsupported, Alternately Place Feet on Step/Stool Able to stand independently and safely and complete 8 steps in 20 seconds   Standing Unsupported, One Foot in Front Able to place foot tandem independently and hold 30 seconds   Standing on One Leg Tries to lift leg/unable to hold 3 seconds but remains standing independently  1.50, 2.03 secs   Total Score 53        Self Care; discussed progress and LTG's with pt and mother; renewal to be completed with focus on trunk strengthening and Gait training to minimize deviations   Prone plank - 3 reps  - 5 sec hold for trunk strengthening L side plank - with RUE support 3 reps - attempted 5 sec hold          [facility] Adult PT Treatment/Exercise - 11/29/15 0001  Ambulation/Gait   Ambulation/Gait Yes   Assistive device None   Gait Pattern Step-through pattern;Lateral trunk lean to left   Ambulation Surface Level;Indoor     Dynamic Gait Index   Level Surface Mild Impairment   Change in Gait Speed Mild Impairment   Gait with Horizontal Head Turns Normal   Gait with Vertical Head Turns Normal   Gait and Pivot Turn Normal   Step Over Obstacle Normal   Step Around Obstacles Normal   Steps Normal   Total Score 22                     PT Long Term Goals - 11/29/15 1806      PT LONG TERM GOAL #1   Title Improve gait velocity to >/= 2.8 ft/sec without cane for incr. gait efficiency.  (11-16-15)   Baseline 11.19 secs = 2.93 ft/sec on 11-28-15   Time 4   Period Weeks   Status Achieved     PT LONG TERM GOAL #2   Title Improve TUG score to </= 11.0 secs  without device for incr. safety with mobility.  (11-16-15)   Baseline 10.16 secs on 11-28-15   Time 4   Period Weeks   Status Achieved     PT LONG TERM GOAL #3   Title Incr. Berg score to >/= 53/56 for incr. safety with leisure activities and for [place]. fall risk. (11-16-15)   Baseline 53/56 on 11-28-15   Time 4   Period Weeks   Status Achieved     PT LONG TERM GOAL #4   Title Complete DGI and establish goal as appropriate.  (11-16-15)  - Incr. DGI score to >/= 22/24 to reduce fall risk.  (established 10-31-15 by SD)    Baseline 22/24 on 11-28-15   Time 4   Period Weeks   Status Achieved     PT LONG TERM GOAL #5   Title Amb. 1000' on all surfaces without device modified independently. (11-16-15)   Baseline met 11-28-15    Time 4   Period Weeks   Status Achieved     PT LONG TERM GOAL #6   Title Independent in HEP for balance and strengthening.  (11-16-15)   Baseline met 11-28-15   Time 4   Period Weeks   Status Achieved     PT LONG TERM GOAL #7   Title Improve Berg score to >/=55/56 to increase safety with high level balance and recreational activities.  (12-28-15)   Baseline 53/56 on 11-28-15   Time 4   Period Weeks   Status New     PT LONG TERM GOAL #8   Title Increase gait velocity to >/= 3.4 ft/sec for incr. gait efficiency.  (12-28-15)   Baseline 2.93 ft/sec on 11-28-15   Time 4   Period Weeks   Status New     PT LONG TERM GOAL  #9   TITLE Jog 120' without LOB for return to recreational activities safely.  (12-28-15)   Time 4   Period Weeks   Status New     PT LONG TERM GOAL  #10   TITLE Amb. 350' on flat surface with minimal to no gait deviations including symmetrical L arm swing and increased internal rotation of LLE during gait.  (12-28-15)   Baseline gait deviations currently present including decr. L arm swing and LLE externally rotated in stance   Time 4   Period Weeks   Status New  Plan - 11/29/15 1731    Rehab Potential Good   PT  Frequency 2x / week   PT Duration 4 weeks  renewal to be completed for additional 4 weeks   PT Treatment/Interventions ADLs/Self Care Home Management;Gait training;Stair training;Functional mobility training;Therapeutic activities;Patient/family education;Neuromuscular re-education;Balance training;Therapeutic exercise   PT Next Visit Plan cont LLE strengthening; trunk strengthening   PT Home Exercise Plan see above   Consulted and Agree with Plan of Care Patient;Family member/caregiver   Family Member Consulted mother      Patient will benefit from skilled therapeutic intervention in order to improve the following deficits and impairments:  Abnormal gait, Decreased balance, Decreased endurance, Decreased activity tolerance, Decreased coordination, Decreased strength  Visit Diagnosis: Other abnormalities of gait and mobility - Plan: PT plan of care cert/re-cert  Hemiplegia, unspecified affecting right dominant side ([place]) - Plan: PT plan of care cert/re-cert  Muscle weakness (generalized) - Plan: PT plan of care cert/re-cert     Problem List Patient Active Problem List   Diagnosis Date Noted  . GERD (gastroesophageal reflux disease) 11/09/2015  . Deep venous thrombosis (DVT) of left peroneal vein ([place]) 09/01/2015  . Right Basal ganglia hemorrhage ([place])   . Accelerated hypertension 08/16/2015  . Essential (primary) hypertension 08/16/2015    DildayJenness Corner, PT 11/29/2015, 6:39 PM  [facility] [address] [place], [place], [address] Phone: [phone]   Fax:  [phone]  Name: ARLANDER GILLEN MRN: [id number] Date of Birth: 09-20-1985

## 2015-11-30 ENCOUNTER — Ambulatory Visit: Payer: 59 | Admitting: Physical Therapy

## 2015-11-30 DIAGNOSIS — R2689 Other abnormalities of gait and mobility: Secondary | ICD-10-CM | POA: Diagnosis not present

## 2015-11-30 DIAGNOSIS — M6281 Muscle weakness (generalized): Secondary | ICD-10-CM

## 2015-11-30 NOTE — Therapy (Signed)
[facility] [address] [place], [place], [address] Phone: [phone]   Fax:  [phone]  Physical Therapy Treatment  Patient Details  Name: Brad Mcgee MRN: [id number] Date of Birth: [birth year] Referring Provider: Dr. Maryla Morrow  Encounter Date: 11/30/2015      PT End of Session - 11/30/15 1158    Visit Number 10   Number of Visits 17   Date for PT Re-Evaluation 12/28/15   Authorization - Visit Number 9   Authorization - Number of Visits 60   PT Start Time 0800   PT Stop Time 0845   PT Time Calculation (min) 45 min      Past Medical History:  Diagnosis Date  . CVA (cerebral vascular accident) (HCC)   . DVT (deep venous thrombosis) (HCC)   . Fracture of left ankle    treated with cast  . Hypertension     Past Surgical History:  Procedure Laterality Date  . greenfield filter      There were no vitals filed for this visit.      Subjective Assessment - 11/30/15 0853    Subjective Pt states he has been working on standing on his L leg - "my grandma has been helping me" - reports he did 2 times for 5 secs (standing on LLE)   Patient is accompained by: Family member  mother   Patient Stated Goals To walk without cane and improve balance   Currently in Pain? No/denies      TherEx:   Abdominal strengthening- leaning on L forearm and lifting up 5 sec hold for external oblique strengthening  Seated trunk rotation with 3# 10 reps to L side - reaching down toward floor then rotating to L side holding 3# dumbbell   L side plank - 10 reps - 3 sec hold - with RUE support for assist   L gastroc strengthening - in standing - LLE only 10 reps 3 sec hold Elliptical Level 1.5 x 1" forward, then 1" backward Standing on foam - LUE flexion/trunk rotation with red theraband  TherAct:  Jumping forward and back 10 reps; LLE only hopping 10 reps without UE support:  Pt jumped rope -  approx. 4-5 consecutive jumps  on 2nd rep without LOB Jogging 120'x 2 reps around track - no LOB (240')   NeuroRe-ed: marching on blue AirEx  - 10 reps each inside bars Lifting LLE up - knee flexed at 90 degrees - knee extension - hip extension, then down, for improved LLE coordination Standing on LLE - same movement with RLE for improved SLS on LLE   Gait:  Gait training without device 120' with tactile cues for incr. L arm swing and L trunk elongation with verbal cues for LLE  internal rotation and incr. push off in stance phase of gait        [facility] Adult PT Treatment/Exercise - 11/30/15 0854      Ambulation/Gait   Ambulation/Gait Yes   Ambulation/Gait Assistance 5: Supervision   Ambulation/Gait Assistance Details tactile cues for L trunk elongation and incr. L arm swing   Ambulation Distance (Feet) 120 Feet   Assistive device None   Gait Pattern Step-through pattern;Lateral trunk lean to left   Ambulation Surface Level;Indoor     Lumbar Exercises: Aerobic   Elliptical 1" forward, 1" backward level 1.5     Lumbar Exercises: Machines for Strengthening   Leg Press 90# bil. LE's 10 reps: 50# LLE only 10 reps  PT Long Term Goals - 11/30/15 1201      PT LONG TERM GOAL #7   Title Improve Berg score to >/=55/56 to increase safety with high level balance and recreational activities.  (12-28-15)   Baseline 53/56 on 11-28-15   Time 4   Period Weeks   Status New     PT LONG TERM GOAL #8   Title Increase gait velocity to >/= 3.4 ft/sec for incr. gait efficiency.  (12-28-15)   Baseline 2.93 ft/sec on 11-28-15   Time 4   Period Weeks   Status New     PT LONG TERM GOAL  #9   TITLE Jog 120' without LOB for return to recreational activities safely.  (12-28-15)   Time 4   Period Weeks   Status New     PT LONG TERM GOAL  #10   TITLE Amb. 350' on flat surface with minimal to no gait deviations including symmetrical L arm swing and increased internal rotation of LLE during gait.   (12-28-15)   Baseline gait deviations currently present including decr. L arm swing and LLE externally rotated in stance   Time 4   Period Weeks   Status New               Plan - 11/30/15 1159    Clinical Impression Statement Pt's gait pattern improved at end of session with tactile cues to incr. L arm swing and for L trunk elongation; LLE is slightly less externally rotated; pt did very well with jumping rope with achieving 4-5 consecutive jumps without LOB   Rehab Potential Good   PT Frequency 2x / week   PT Duration 4 weeks   PT Treatment/Interventions ADLs/Self Care Home Management;Gait training;Stair training;Functional mobility training;Therapeutic activities;Patient/family education;Neuromuscular re-education;Balance training;Therapeutic exercise   PT Next Visit Plan cont LLE strengthening; trunk strengthening   PT Home Exercise Plan see above   Consulted and Agree with Plan of Care Patient;Family member/caregiver   Family Member Consulted mother      Patient will benefit from skilled therapeutic intervention in order to improve the following deficits and impairments:  Abnormal gait, Decreased balance, Decreased endurance, Decreased activity tolerance, Decreased coordination, Decreased strength  Visit Diagnosis: Other abnormalities of gait and mobility  Muscle weakness (generalized)     Problem List Patient Active Problem List   Diagnosis Date Noted  . GERD (gastroesophageal reflux disease) 11/09/2015  . Deep venous thrombosis (DVT) of left peroneal vein (HCC) 09/01/2015  . Right Basal ganglia hemorrhage (HCC)   . Accelerated hypertension 08/16/2015  . Essential (primary) hypertension 08/16/2015    DildayDonavan Burnet, Jia Mohamed Suzanne, PT 11/30/2015, 1:01 PM  [facility] [address] [place], [place], [address] Phone: [phone]   Fax:  [phone]  Name: [name] M Heggs MRN: [id number] Date of Birth:  04/28/1985

## 2015-12-04 ENCOUNTER — Ambulatory Visit: Payer: 59 | Admitting: Physical Therapy

## 2015-12-04 DIAGNOSIS — R2689 Other abnormalities of gait and mobility: Secondary | ICD-10-CM

## 2015-12-04 DIAGNOSIS — M6281 Muscle weakness (generalized): Secondary | ICD-10-CM

## 2015-12-04 NOTE — Therapy (Signed)
[facility] [address] [place], [place], [address] Phone: [phone]   Fax:  [phone]  Physical Therapy Treatment  Patient Details  Name: Brad Mcgee MRN: [id number] Date of Birth: 04/28/1985 Referring Provider: Dr. Maryla MorrowAnkit Patel  Encounter Date: 12/04/2015      PT End of Session - 12/04/15 2040    Visit Number 11   Number of Visits 17   Date for PT Re-Evaluation 12/28/15   Authorization Type UHC   Authorization - Visit Number 10   Authorization - Number of Visits 60   PT Start Time 0846   PT Stop Time 0932   PT Time Calculation (min) 46 min      Past Medical History:  Diagnosis Date  . CVA (cerebral vascular accident) (HCC)   . DVT (deep venous thrombosis) (HCC)   . Fracture of left ankle    treated with cast  . Hypertension     Past Surgical History:  Procedure Laterality Date  . greenfield filter      There were no vitals filed for this visit.      Subjective Assessment - 12/04/15 2038    Subjective Pt reports [place] problems [place] race [place] this [place] - reports he is doing better with walking   Patient is accompained by: Family member   Patient Stated Goals To walk without cane and improve balance   Currently in Pain? No/denies                         [facility] Adult PT Treatment/Exercise - 12/04/15 0911      Ambulation/Gait   Ambulation/Gait Yes   Ambulation/Gait Assistance 5: Supervision   Ambulation/Gait Assistance Details tactile cues for L trunk elongation and incr. L arm swing   Ambulation Distance (Feet) 240 Feet   Assistive device None   Gait Pattern Step-through pattern;Lateral trunk lean to left   Ambulation Surface Level;Indoor   Stairs Yes   Stairs Assistance 5: Supervision   Number of Stairs 4   Height of Stairs 6     Lumbar Exercises: Aerobic   Elliptical 1" forward, 1" backward level 1.5     Lumbar Exercises: Machines for Strengthening   Leg  Press 70# bil. LE's 10 reps: 40# LLE only 20 reps     Lumbar Exercises: Standing   Heel Raises 10 reps      TherAct; pt jumped rope - 6 consecutive jumps for 1 trial, 4 jumps 2 trials and 3 jumps 1 trial - improved from previous  Treatment session Standing on blue Airex - L shoulder flexion and diagonal extension with green theraband x 10 reps each for core stabilization  NeuroRe-ed; L single limb stance activities - on floor; jumping, hopping and lunges inside bars - 5 reps each Side plank 5 reps with UE support prn - 2 sec hold standing on Bosu - on LLE - moving RLE up/back and laterally for improved L SLS Squats x 10 reps for core stabilization            PT Long Term Goals - 11/30/15 1201      PT LONG TERM GOAL #7   Title Improve Berg score to >/=55/56 to increase safety with high level balance and recreational activities.  (12-28-15)   Baseline 53/56 on 11-28-15   Time 4   Period Weeks   Status New     PT LONG TERM GOAL #8   Title Increase gait  velocity to >/= 3.4 ft/sec for incr. gait efficiency.  (12-28-15)   Baseline 2.93 ft/sec on 11-28-15   Time 4   Period Weeks   Status New     PT LONG TERM GOAL  #9   TITLE Jog 120' without LOB for return to recreational activities safely.  (12-28-15)   Time 4   Period Weeks   Status New     PT LONG TERM GOAL  #10   TITLE Amb. 350' on flat surface with minimal to no gait deviations including symmetrical L arm swing and increased internal rotation of LLE during gait.  (12-28-15)   Baseline gait deviations currently present including decr. L arm swing and LLE externally rotated in stance   Time 4   Period Weeks   Status New               Plan - 12/04/15 2041    Clinical Impression Statement Pt is progressing well towards LTG's - gait pattern is improving with less deviations noted - pt had incr. internal rotation LLE today without cueing   Rehab Potential Good   PT Frequency 2x / week   PT Duration 4 weeks    PT Treatment/Interventions ADLs/Self Care Home Management;Gait training;Stair training;Functional mobility training;Therapeutic activities;Patient/family education;Neuromuscular re-education;Balance training;Therapeutic exercise   PT Next Visit Plan cont LLE strengthening; trunk strengthening   PT Home Exercise Plan see above   Consulted and Agree with Plan of Care Patient;Family member/caregiver   Family Member Consulted mother      Patient will benefit from skilled therapeutic intervention in order to improve the following deficits and impairments:  Abnormal gait, Decreased balance, Decreased endurance, Decreased activity tolerance, Decreased coordination, Decreased strength  Visit Diagnosis: Other abnormalities of gait and mobility  Muscle weakness (generalized)     Problem List Patient Active Problem List   Diagnosis Date Noted  . GERD (gastroesophageal reflux disease) 11/09/2015  . Deep venous thrombosis (DVT) of left peroneal vein (HCC) 09/01/2015  . Right Basal ganglia hemorrhage (HCC)   . Accelerated hypertension 08/16/2015  . Essential (primary) hypertension 08/16/2015    Brad Mcgee, PT 12/04/2015, 8:45 PM  [facility] [address] [place], [place], [address] Phone: [phone]   Fax:  [phone]  Name: Brad Mcgee MRN: [id number] Date of Birth: 09/18/1985

## 2015-12-05 ENCOUNTER — Encounter: Payer: Self-pay | Admitting: Neurology

## 2015-12-07 ENCOUNTER — Ambulatory Visit: Payer: 59 | Admitting: Physical Therapy

## 2015-12-07 DIAGNOSIS — R2689 Other abnormalities of gait and mobility: Secondary | ICD-10-CM

## 2015-12-07 DIAGNOSIS — M6281 Muscle weakness (generalized): Secondary | ICD-10-CM

## 2015-12-07 NOTE — Therapy (Signed)
[facility] [address] [place], [place], [address] Phone: [phone]   Fax:  [phone]  Physical Therapy Treatment  Patient Details  Name: Brad Mcgee MRN: [id number] Date of Birth: 1986-01-18 Referring Provider: Dr. Maryla Morrow  Encounter Date: 12/07/2015      PT End of Session - 12/07/15 1150    Visit Number 12   Number of Visits 17   Date for PT Re-Evaluation 12/28/15   Authorization Type UHC   Authorization - Visit Number 11   Authorization - Number of Visits 60   PT Start Time 1015   PT Stop Time 1102   PT Time Calculation (min) 47 min      Past Medical History:  Diagnosis Date  . CVA (cerebral vascular accident) (HCC)   . DVT (deep venous thrombosis) (HCC)   . Fracture of left ankle    treated with cast  . Hypertension     Past Surgical History:  Procedure Laterality Date  . greenfield filter      There were no vitals filed for this visit.      Subjective Assessment - 12/07/15 1147    Subjective Pt states he is going to race in [place], [place] this weekend with his nephew - plans to take cane with him to have just in case he should need it   Patient is accompained by: Family member  Grandmother   Patient Stated Goals To walk without cane and improve balance   Currently in Pain? No/denies     TherAct: 10 trials total of jumping rope  Jump rope - 2, 3 , 1, 1, 4, 1, 7, 1, 8, 1  (clearances)   Jumping forward and back 10 reps over yardstick; jumping laterally over yardstick inside // bars without UE support    Neuro Re-ed; side plank 5 reps with RUE support 3 sec hold: 3 reps without RUE support for 2 sec hold Tall kneeling - squats x 10 reps back onto heels L 1/2 kneeling - reaching diagonally with LUE toward R side - then trunk rotation toward L side with cues for  Trunk elongation Cone taps - standing on blue mat - touching tops 5 times - then tipping over and standing up - 4 cones x  2 reps with CGA with LOB  Standing on LLE - on blue mat - R hip and knee flexion/knee extension then placing foot back down on floor for improved  L SLS x 5 reps          [facility] Adult PT Treatment/Exercise - 12/07/15 1028      Ambulation/Gait   Ambulation/Gait Yes   Ambulation/Gait Assistance 5: Supervision   Ambulation/Gait Assistance Details tactile cues for L trunk elongation   Ambulation Distance (Feet) 240 Feet   Assistive device None   Gait Pattern Step-through pattern;Lateral trunk lean to left   Ambulation Surface Level;Indoor     Lumbar Exercises: Aerobic   Elliptical 1" forward, 1" backward level 1.5     Lumbar Exercises: Machines for Strengthening   Leg Press 80# bil. LE's 15 reps: 50# LLE only 2 sets 10 reps                     PT Long Term Goals - 11/30/15 1201      PT LONG TERM GOAL #7   Title Improve Berg score to >/=55/56 to increase safety with high level balance and recreational activities.  (12-28-15)   Baseline 53/56  on 11-28-15   Time 4   Period Weeks   Status New     PT LONG TERM GOAL #8   Title Increase gait velocity to >/= 3.4 ft/sec for incr. gait efficiency.  (12-28-15)   Baseline 2.93 ft/sec on 11-28-15   Time 4   Period Weeks   Status New     PT LONG TERM GOAL  #9   TITLE Jog 120' without LOB for return to recreational activities safely.  (12-28-15)   Time 4   Period Weeks   Status New     PT LONG TERM GOAL  #10   TITLE Amb. 350' on flat surface with minimal to no gait deviations including symmetrical L arm swing and increased internal rotation of LLE during gait.  (12-28-15)   Baseline gait deviations currently present including decr. L arm swing and LLE externally rotated in stance   Time 4   Period Weeks   Status New               Plan - 12/07/15 1152    Clinical Impression Statement Pt continues to progress well towards LTG's - is demonstrating improvement in positioning in LLE with less cues required:  cont  to have decr. L SLS   Rehab Potential Good   PT Frequency 2x / week   PT Duration 4 weeks   PT Treatment/Interventions ADLs/Self Care Home Management;Gait training;Stair training;Functional mobility training;Therapeutic activities;Patient/family education;Neuromuscular re-education;Balance training;Therapeutic exercise   PT Next Visit Plan cont LLE strengthening; trunk strengthening   PT Home Exercise Plan see above   Consulted and Agree with Plan of Care Patient;Family member/caregiver   Family Member Consulted Grandmother      Patient will benefit from skilled therapeutic intervention in order to improve the following deficits and impairments:  Abnormal gait, Decreased balance, Decreased endurance, Decreased activity tolerance, Decreased coordination, Decreased strength  Visit Diagnosis: Other abnormalities of gait and mobility  Muscle weakness (generalized)     Problem List Patient Active Problem List   Diagnosis Date Noted  . GERD (gastroesophageal reflux disease) 11/09/2015  . Deep venous thrombosis (DVT) of left peroneal vein (HCC) 09/01/2015  . Right Basal ganglia hemorrhage (HCC)   . Accelerated hypertension 08/16/2015  . Essential (primary) hypertension 08/16/2015    DildayDonavan Burnet, Keirston Saephanh Suzanne, PT 12/07/2015, 11:58 AM  [facility] [address] [place], [place], [address] Phone: [phone]   Fax:  [phone]  Name: Brad Mcgee MRN: [id number] Date of Birth: 06/08/1985

## 2015-12-12 ENCOUNTER — Ambulatory Visit: Payer: 59 | Admitting: Physical Therapy

## 2015-12-12 DIAGNOSIS — M6281 Muscle weakness (generalized): Secondary | ICD-10-CM

## 2015-12-12 DIAGNOSIS — R2689 Other abnormalities of gait and mobility: Secondary | ICD-10-CM

## 2015-12-12 NOTE — Therapy (Signed)
[facility] [address] [place], [place], [address] Phone: [phone]   Fax:  [phone]  Physical Therapy Treatment  Patient Details  Name: Brad Mcgee MRN: [id number] Date of Birth: 02/14/1986 Referring Provider: Dr. Maryla MorrowAnkit Patel  Encounter Date: 12/12/2015      PT End of Session - 12/14/15 1045    Visit Number 13   Number of Visits 17   Date for PT Re-Evaluation 12/28/15   Authorization Type UHC   Authorization - Visit Number 12   Authorization - Number of Visits 60   PT Start Time 0932   PT Stop Time 1016   PT Time Calculation (min) 44 min      Past Medical History:  Diagnosis Date  . CVA (cerebral vascular accident) (HCC)   . DVT (deep venous thrombosis) (HCC)   . Fracture of left ankle    treated with cast  . Hypertension     Past Surgical History:  Procedure Laterality Date  . greenfield filter      There were no vitals filed for this visit.      Subjective Assessment - 12/14/15 1023    Subjective Pt states [place] - took his cane but did fine   Patient is accompained by: Family member   Patient Stated Goals To walk without cane and improve balance   Currently in Pain? No/denies       TherAct: Jumping rope:  1, 3, 6, 15, 4 consecutive clearances - bil. Feet together     L single limb stance activities - on floor; jumping, hopping and lunges inside bars - 5 reps each Side plank 5 reps with UE support prn - 2 sec hold standing on Bosu - on LLE - moving RLE up/back and laterally for improved L SLS Squats x 10 reps for core stabilization                 [facility] Adult PT Treatment/Exercise - 12/14/15 0001      Ambulation/Gait   Ambulation/Gait Yes   Ambulation/Gait Assistance 5: Supervision   Ambulation/Gait Assistance Details cues for arm swing and trunk elongation and LLE internal rotation   Ambulation  Distance (Feet) 360 Feet   Assistive device None   Gait Pattern Step-through pattern   Ambulation Surface Level;Indoor     Lumbar Exercises: Aerobic   Elliptical 1" forward, 1" backward level 1.5     Lumbar Exercises: Machines for Strengthening   Leg Press 80# bil. LE's 15 reps: 50# LLE only 2 sets 10 reps                     PT Long Term Goals - 11/30/15 1201      PT LONG TERM GOAL #7   Title Improve Berg score to >/=55/56 to increase safety with high level balance and recreational activities.  (12-28-15)   Baseline 53/56 on 11-28-15   Time 4   Period Weeks   Status New     PT LONG TERM GOAL #8   Title Increase gait velocity to >/= 3.4 ft/sec for incr. gait efficiency.  (12-28-15)   Baseline 2.93 ft/sec on 11-28-15   Time 4   Period Weeks   Status New     PT LONG TERM GOAL  #9   TITLE Jog 120' without LOB for return to recreational activities safely.  (12-28-15)   Time 4   Period  Weeks   Status New     PT LONG TERM GOAL  #10   TITLE Amb. 350' on flat surface with minimal to no gait deviations including symmetrical L arm swing and increased internal rotation of LLE during gait.  (12-28-15)   Baseline gait deviations currently present including decr. L arm swing and LLE externally rotated in stance   Time 4   Period Weeks   Status New               Plan - 12/14/15 1046    Clinical Impression Statement Pt progressing well towards goals - demonstrating increased trunk strength/core stabilization as evidenced by improvement with side plank exercise   Rehab Potential Good   PT Frequency 2x / week   PT Duration 4 weeks   PT Treatment/Interventions ADLs/Self Care Home Management;Gait training;Stair training;Functional mobility training;Therapeutic activities;Patient/family education;Neuromuscular re-education;Balance training;Therapeutic exercise   PT Next Visit Plan cont LLE strengthening; trunk strengthening   PT Home Exercise Plan see above    Consulted and Agree with Plan of Care Patient;Family member/caregiver   Family Member Consulted Grandmother      Patient will benefit from skilled therapeutic intervention in order to improve the following deficits and impairments:  Abnormal gait, Decreased balance, Decreased endurance, Decreased activity tolerance, Decreased coordination, Decreased strength  Visit Diagnosis: Other abnormalities of gait and mobility  Muscle weakness (generalized)     Problem List Patient Active Problem List   Diagnosis Date Noted  . GERD (gastroesophageal reflux disease) 11/09/2015  . Deep venous thrombosis (DVT) of left peroneal vein (HCC) 09/01/2015  . Right Basal ganglia hemorrhage (HCC)   . Accelerated hypertension 08/16/2015  . Essential (primary) hypertension 08/16/2015    DildayDonavan Burnet, PT 12/14/2015, 10:49 AM  [facility] [address] [place], [place], [address] Phone: [phone]   Fax:  [phone]  Name: Brad Mcgee MRN: [id number] Date of Birth: 1985-05-25

## 2015-12-19 ENCOUNTER — Ambulatory Visit: Payer: 59 | Attending: Physical Medicine & Rehabilitation | Admitting: Physical Therapy

## 2015-12-19 DIAGNOSIS — R2689 Other abnormalities of gait and mobility: Secondary | ICD-10-CM

## 2015-12-19 DIAGNOSIS — M6281 Muscle weakness (generalized): Secondary | ICD-10-CM | POA: Diagnosis present

## 2015-12-19 NOTE — Therapy (Signed)
[facility] [address] [place], [place], [address] Phone: [phone]   Fax:  [phone]  Physical Therapy Treatment  Patient Details  Name: Brad Mcgee MRN: [id number] Date of Birth: 1985-07-28 Referring Provider: Dr. Maryla Morrow  Encounter Date: 12/19/2015      PT End of Session - 12/19/15 2030    Visit Number 14   Number of Visits 17   Date for PT Re-Evaluation 12/28/15   Authorization Type UHC   Authorization - Visit Number 13   Authorization - Number of Visits 60   PT Start Time 0931   PT Stop Time 1015   PT Time Calculation (min) 44 min      Past Medical History:  Diagnosis Date  . CVA (cerebral vascular accident) (HCC)   . DVT (deep venous thrombosis) (HCC)   . Fracture of left ankle    treated with cast  . Hypertension     Past Surgical History:  Procedure Laterality Date  . greenfield filter      There were no vitals filed for this visit.      Subjective Assessment - 12/19/15 2027    Subjective Pt reports no problems or changes since last visit   Patient is accompained by: Family member   Patient Stated Goals To walk without cane and improve balance   Currently in Pain? No/denies      Jumping rope - bil. LE's-- 3,3 ,1, 15,19 consecutive jumps    Jumping forward and backward - over yardstick on floor - 5 reps each; 5 reps laterally L single limb hopping forward and backward - 5 reps - pt unable to clear yardstick jumping backward  Amb. On tip toes forward and backward inside // bars 10' x 4 reps with knees flexed    TherEx; side plank for L abdominal strengthening - UE support needed - 3 reps - only approx. 2 sec hold due to difficulty Prone plank 3 reps 5 sec hold for core strengthening   Forward step ups LLE - 10 reps Step down exercise -  LLE 10 reps for L quad eccentric strengthening           [facility] Adult PT Treatment/Exercise - 12/19/15 0952      Lumbar  Exercises: Stretches   Active Hamstring Stretch 1 rep;30 seconds  LLE     Lumbar Exercises: Aerobic   Elliptical 1" forward, 1" backward level 1.5     Lumbar Exercises: Machines for Strengthening   Leg Press 90# bil. LE's 15 reps: 60# LLE only 2 sets 10 reps     Lumbar Exercises: Standing   Heel Raises 10 reps                     PT Long Term Goals - 11/30/15 1201      PT LONG TERM GOAL #7   Title Improve Berg score to >/=55/56 to increase safety with high level balance and recreational activities.  (12-28-15)   Baseline 53/56 on 11-28-15   Time 4   Period Weeks   Status New     PT LONG TERM GOAL #8   Title Increase gait velocity to >/= 3.4 ft/sec for incr. gait efficiency.  (12-28-15)   Baseline 2.93 ft/sec on 11-28-15   Time 4   Period Weeks   Status New     PT LONG TERM GOAL  #9   TITLE Jog 120' without LOB for return to recreational activities safely.  (12-28-15)  Time 4   Period Weeks   Status New     PT LONG TERM GOAL  #10   TITLE Amb. 350' on flat surface with minimal to no gait deviations including symmetrical L arm swing and increased internal rotation of LLE during gait.  (12-28-15)   Baseline gait deviations currently present including decr. L arm swing and LLE externally rotated in stance   Time 4   Period Weeks   Status New               Plan - 12/19/15 2031    Clinical Impression Statement Pt improving with increasing LLE strength and coordination - pt performed most consecutive jumps with jump rope today (19) demonstrating improved coordination and endurance   Rehab Potential Good   PT Frequency 2x / week   PT Duration 4 weeks   PT Treatment/Interventions ADLs/Self Care Home Management;Gait training;Stair training;Functional mobility training;Therapeutic activities;Patient/family education;Neuromuscular re-education;Balance training;Therapeutic exercise   PT Next Visit Plan cont LLE strengthening; trunk strengthening   PT Home  Exercise Plan see above   Consulted and Agree with Plan of Care Patient;Family member/caregiver   Family Member Consulted Grandmother      Patient will benefit from skilled therapeutic intervention in order to improve the following deficits and impairments:  Abnormal gait, Decreased balance, Decreased endurance, Decreased activity tolerance, Decreased coordination, Decreased strength  Visit Diagnosis: Other abnormalities of gait and mobility  Muscle weakness (generalized)     Problem List Patient Active Problem List   Diagnosis Date Noted  . GERD (gastroesophageal reflux disease) 11/09/2015  . Deep venous thrombosis (DVT) of left peroneal vein (HCC) 09/01/2015  . Right Basal ganglia hemorrhage (HCC)   . Accelerated hypertension 08/16/2015  . Essential (primary) hypertension 08/16/2015    DildayDonavan Mcgee, Brad Mcgee, PT 12/19/2015, 8:37 PM  [place] [facility] [address] [place], [place], [address] Phone: [phone]   Fax:  [phone]  Name: Brad Mcgee MRN: [id number] Date of Birth: 10/22/1985

## 2015-12-21 ENCOUNTER — Ambulatory Visit: Payer: 59 | Admitting: Physical Therapy

## 2015-12-21 DIAGNOSIS — R2689 Other abnormalities of gait and mobility: Secondary | ICD-10-CM

## 2015-12-21 DIAGNOSIS — M6281 Muscle weakness (generalized): Secondary | ICD-10-CM

## 2015-12-21 NOTE — Therapy (Signed)
[facility] [address] [place], [place], [address] Phone: [phone]   Fax:  [phone]  Physical Therapy Treatment  Patient Details  Name: Brad Mcgee MRN: [id number] Date of Birth: 03/17/1986 Referring Provider: Dr. Maryla MorrowAnkit Patel  Encounter Date: 12/21/2015      PT End of Session - 12/21/15 1316    Visit Number 15   Number of Visits 17   Date for PT Re-Evaluation 12/28/15   Authorization Type UHC   Authorization - Visit Number 14   Authorization - Number of Visits 60   PT Start Time 0847   PT Stop Time 0932   PT Time Calculation (min) 45 min      Past Medical History:  Diagnosis Date  . CVA (cerebral vascular accident) (HCC)   . DVT (deep venous thrombosis) (HCC)   . Fracture of left ankle    treated with cast  . Hypertension     Past Surgical History:  Procedure Laterality Date  . greenfield filter      There were no vitals filed for this visit.      Subjective Assessment - 12/21/15 1256    Subjective Pt reports no problems - continues to do well - plans to get a jump rope but pt has not gotten one yet   Patient is accompained by: Family member   Patient Stated Goals To walk without cane and improve balance   Currently in Pain? No/denies               TherAct:   Jumping rope = 1, 1, 1, 9, 8, 14 consecutive clearances  Jumping forward and backward over yardstick on floor - 5 reps  Jumping laterally 5 times with pause between jumps, then increased speed for 5 reps - without UE support  L single limb hopping - over and back of yardstick - pt unable to clear yardstick with backwards jump- 5 reps performed        [facility]Exercise - 12/21/15 1003      Lumbar Exercises: Machines for Strengthening   Leg Press 90# bil. LE's 15 reps; LLE only 60# 15 reps     Lumbar Exercises: Standing   Heel Raises 10 reps   Functional Squats 10 reps  on BOSU     Neuro Re-ed;  prone planks 3 reps 5 sec hold:  L side plank 3 reps 5 sec hold Tricep dips on raised hi/lo table - moving RLE up/down and knee extension - 5 reps each  Sidestepping laterally inside // bars - on tip toes for plantarflexor strengthening 10' x 4 reps, then forward and backward 10' x 4 reps   Standing on BOSU - inside // bars - LLE in middle - moving RLE forward and backward, then laterally 5 reps each direction With minimal UE support for improved L SLS          PT Long Term Goals - 11/30/15 1201      PT LONG TERM GOAL #7   Title Improve Berg score to >/=55/56 to increase safety with high level balance and recreational activities.  (12-28-15)   Baseline 53/56 on 11-28-15   Time 4   Period Weeks   Status New     PT LONG TERM GOAL #8   Title Increase gait velocity to >/= 3.4 ft/sec for incr. gait efficiency.  (12-28-15)   Baseline 2.93 ft/sec on 11-28-15   Time 4   Period Weeks   Status New  PT LONG TERM GOAL  #9   TITLE Jog 120' without LOB for return to recreational activities safely.  (12-28-15)   Time 4   Period Weeks   Status New     PT LONG TERM GOAL  #10   TITLE Amb. 350' on flat surface with minimal to no gait deviations including symmetrical L arm swing and increased internal rotation of LLE during gait.  (12-28-15)   Baseline gait deviations currently present including decr. L arm swing and LLE externally rotated in stance   Time 4   Period Weeks   Status New               Plan - 12/21/15 1317    Clinical Impression Statement Pt continues to progress well with improving balance and trunk musc. strength - continues to require cues to decrease LLE internal rotation in stance and gait;  pt able to perform L side plank with less UE support today   Rehab Potential Good   PT Frequency 2x / week   PT Duration 4 weeks   PT Treatment/Interventions ADLs/Self Care Home Management;Gait training;Stair training;Functional mobility training;Therapeutic  activities;Patient/family education;Neuromuscular re-education;Balance training;Therapeutic exercise   PT Next Visit Plan cont LLE strengthening; trunk strengthening   PT Home Exercise Plan see above   Consulted and Agree with Plan of Care Patient;Family member/caregiver   Family Member Consulted mother      Patient will benefit from skilled therapeutic intervention in order to improve the following deficits and impairments:  Abnormal gait, Decreased balance, Decreased endurance, Decreased activity tolerance, Decreased coordination, Decreased strength  Visit Diagnosis: Other abnormalities of gait and mobility  Muscle weakness (generalized)     Problem List Patient Active Problem List   Diagnosis Date Noted  . GERD (gastroesophageal reflux disease) 11/09/2015  . Deep venous thrombosis (DVT) of left peroneal vein (HCC) 09/01/2015  . Right Basal ganglia hemorrhage (HCC)   . Accelerated hypertension 08/16/2015  . Essential (primary) hypertension 08/16/2015    Nicholous Girgenti, [name], PT 12/21/2015, 1:24 PM  [facility] [address] [place], [place], [address] Phone: [phone]   Fax:  [phone]  Name: Brad Mcgee MRN: [id number] Date of Birth: November 21, 1985

## 2015-12-25 ENCOUNTER — Ambulatory Visit: Payer: 59 | Admitting: Physical Therapy

## 2015-12-25 ENCOUNTER — Encounter: Payer: Self-pay | Admitting: Neurology

## 2015-12-25 DIAGNOSIS — R2689 Other abnormalities of gait and mobility: Secondary | ICD-10-CM | POA: Diagnosis not present

## 2015-12-25 DIAGNOSIS — M6281 Muscle weakness (generalized): Secondary | ICD-10-CM

## 2015-12-25 NOTE — Therapy (Signed)
[facility] [address] [place], [place], [address] Phone: [phone]   Fax:  [phone]  Physical Therapy Treatment  Patient Details  Name: Brad Mcgee MRN: [id number] Date of Birth: 03/26/1985 Referring Provider: Dr. Maryla MorrowAnkit Patel  Encounter Date: 12/25/2015      PT End of Session - 12/25/15 1633    Visit Number 16   Number of Visits 17   Date for PT Re-Evaluation 12/28/15   Authorization Type UHC   Authorization - Visit Number 15   Authorization - Number of Visits 60   PT Start Time 0846   PT Stop Time 0932   PT Time Calculation (min) 46 min      Past Medical History:  Diagnosis Date  . CVA (cerebral vascular accident) (HCC)   . DVT (deep venous thrombosis) (HCC)   . Fracture of left ankle    treated with cast  . Hypertension     Past Surgical History:  Procedure Laterality Date  . greenfield filter      There were no vitals filed for this visit.      Subjective Assessment - 12/25/15 1437    Subjective Pt reports his mom got him a jump rope this weekend - plans to start using it soon   Patient is accompained by: Family member   Patient Stated Goals To walk without cane and improve balance   Currently in Pain? No/denies      Jumping rope:  1,1, 7, 1,2 clearances (bil. LE's together)   TherEx:  Abdominal (oblique strengthening) - leaning on L forearm - elevating trunk with obliques x 10 reps   Prone plank - 5 reps  - 5 sec hold for trunk strengthening L side plank - with RUE support prn 5 reps - attempted 5 sec hold; pt able to perform final 2 without UE support for 3-4 sec hold  NeuroRe-ed:  Amb. Forward and backward on tiptoes inside // bars 10' x 2 reps each (40' total) and then laterally on tiptoes 10' x 4 reps  without UE support Single limb stance LLE - 4 trials; 1-2 secs each with longest time achieved 2.85 secs  TherAct; jumping over and back of yardstick on floor - 5 reps with  pause after forward/back; 5 reps quickly for fascilitation of coordination And incr. strengthening Jumping laterally over yardstick - 5 reps with pause, then 5 reps quickly with less pause as possible            [facility] Adult PT Treatment/Exercise - 12/25/15 0001      Lumbar Exercises: Aerobic   Elliptical 1" forward, 1" backward level 1.5     Lumbar Exercises: Machines for Strengthening   Leg Press 90# bil. LE's 15 reps; LLE only 60# 15 reps     Lumbar Exercises: Standing   Heel Raises 10 reps                     PT Long Term Goals - 12/25/15 1640      PT LONG TERM GOAL #8   Title Increase gait velocity to >/= 3.4 ft/sec for incr. gait efficiency.  (12-28-15)   Baseline 10.47, 11.03 = 3.13 ft/sec; 2.97 ft/sec               Plan - 12/25/15 1634    Clinical Impression Statement Pt progressing well towards goals - demonstrating improved LLE strength with ability to single limb hop forward and back over yardstick on floor  with complete clearance for 1st time today; pt also able to perform L side plank exercise without UE support, demonstrating improved core strength; plan D/C next session                                                                                                            Rehab Potential Good   PT Frequency 2x / week   PT Duration 4 weeks   PT Treatment/Interventions ADLs/Self Care Home Management;Gait training;Stair training;Functional mobility training;Therapeutic activities;Patient/family education;Neuromuscular re-education;Balance training;Therapeutic exercise   PT Next Visit Plan check goals - plan D/C   PT Home Exercise Plan see above   Consulted and Agree with Plan of Care Patient      Patient will benefit from skilled therapeutic intervention in order to improve the following deficits and impairments:  Abnormal gait, Decreased balance, Decreased endurance, Decreased activity tolerance, Decreased coordination, Decreased  strength  Visit Diagnosis: Other abnormalities of gait and mobility  Muscle weakness (generalized)     Problem List Patient Active Problem List   Diagnosis Date Noted  . GERD (gastroesophageal reflux disease) 11/09/2015  . Deep venous thrombosis (DVT) of left peroneal vein (HCC) 09/01/2015  . Right Basal ganglia hemorrhage (HCC)   . Accelerated hypertension 08/16/2015  . Essential (primary) hypertension 08/16/2015    DildayDonavan Burnet, PT 12/25/2015, 4:44 PM  [place] [facility] [address] [place], [place], [address] Phone: [phone]   Fax:  [phone]  Name: Brad Mcgee MRN: [id number] Date of Birth: [birth year]

## 2015-12-28 ENCOUNTER — Ambulatory Visit: Payer: 59 | Admitting: Physical Therapy

## 2015-12-28 DIAGNOSIS — R2689 Other abnormalities of gait and mobility: Secondary | ICD-10-CM | POA: Diagnosis not present

## 2015-12-28 DIAGNOSIS — M6281 Muscle weakness (generalized): Secondary | ICD-10-CM

## 2015-12-29 ENCOUNTER — Encounter: Payer: Self-pay | Admitting: Physical Medicine & Rehabilitation

## 2015-12-29 NOTE — Therapy (Signed)
[place] [address] [place], [place], [address] Phone: [phone]   Fax:  [phone]  Physical Therapy Treatment  Patient Details  Name: Brad Mcgee MRN: [id number] Date of Birth: [birth year] Referring Provider: Dr. Delice Lesch  Encounter Date: 12/28/2015      PT End of Session - 12/29/15 1030    Visit Number 17   Number of Visits 17   Date for PT Re-Evaluation 12/28/15   Authorization Type UHC   Authorization - Visit Number 16   Authorization - Number of Visits 71   PT Start Time 9735   PT Stop Time 0934   PT Time Calculation (min) 45 min      Past Medical History:  Diagnosis Date  . CVA (cerebral vascular accident) ([place])   . DVT (deep venous thrombosis) ([place])   . Fracture of left ankle    treated with cast  . Hypertension     Past Surgical History:  Procedure Laterality Date  . greenfield filter      There were no vitals filed for this visit.      Subjective Assessment - 12/28/15 0852    Subjective Pt reports he is doing well - ready for discharge   Patient Stated Goals To walk without cane and improve balance   Currently in Pain? No/denies                         [facility] Adult PT Treatment/Exercise - 12/29/15 0001      Lumbar Exercises: Aerobic   Elliptical 1" forward, 1" backward level 1.5     Lumbar Exercises: Machines for Strengthening   Leg Press 90# bil. LE's 15 reps; LLE only 60# 15 reps     Lumbar Exercises: Standing   Heel Raises 10 reps      Gait: pt amb. 120' with no device with cues for incr. Internal rotation of LLE and incr. L arm swing - minimal gait deviations noted after Cuing  Gait velocity 9.64 secs = 3.4 ft/sec with no device  TherAct:  Pt performed jumping forward and back over yardstick 10 reps; jumping laterally 10 reps; LLE hopping 10 reps attempting to  Completely clear yardstick -- difficult to do with complete clearance Pt jogged 120'  around track without LOB - no difficulty noted with this activity  Pt performed 1/2 jumping jacks with alternate lunges - 5 reps without need for UE support - no LOB noted  L single limb stance time trials - 2.65 secs longest duration out of 5 trials -- other times approx 1-2.4 secs  Amb. Forward and backward on tiptoes with knees flexed inside // bars 10'x 4 reps and then laterally on tip toes with knees flexed for  plantarflexor and hamstring strengthening 10' x 2 reps            PT Long Term Goals - 12/29/15 1025      PT LONG TERM GOAL #1   Title Improve gait velocity to >/= 2.8 ft/sec without cane for incr. gait efficiency.  (11-16-15)   Baseline 11.19 secs = 2.93 ft/sec on 11-28-15   Status Achieved     PT LONG TERM GOAL #2   Title Improve TUG score to </= 11.0 secs without device for incr. safety with mobility.  (11-16-15)   Baseline 10.16 secs on 11-28-15   Status Achieved     PT LONG TERM GOAL #3   Title Incr. Berg score to >/=  53/56 for incr. safety with leisure activities and for [place]. fall risk. (11-16-15)   Baseline 53/56 on 11-28-15   Status Achieved     PT LONG TERM GOAL #4   Title Complete DGI and establish goal as appropriate.  (11-16-15)  - Incr. DGI score to >/= 22/24 to reduce fall risk.  (established 10-31-15 by SD)    Baseline 22/24 on 11-28-15   Status Achieved     PT LONG TERM GOAL #5   Title Amb. 1000' on all surfaces without device modified independently. (11-16-15)   Baseline met 11-28-15    Status Achieved     PT LONG TERM GOAL #6   Title Independent in HEP for balance and strengthening.  (11-16-15)   Baseline met 11-28-15   Status Achieved     PT LONG TERM GOAL #7   Title Improve Berg score to >/=55/56 to increase safety with high level balance and recreational activities.  (12-28-15)   Baseline 55/56 on 12-28-15   Status Achieved     PT LONG TERM GOAL #8   Title Increase gait velocity to >/= 3.4 ft/sec for incr. gait efficiency.  (12-28-15)    Baseline 9.64 secs = 3.41 ft/sec with no device on 12-28-15   Status Achieved     PT LONG TERM GOAL  #9   TITLE Jog 120' without LOB for return to recreational activities safely.  (12-28-15)   Baseline met 12-28-15   Status Achieved     PT LONG TERM GOAL  #10   TITLE Amb. 350' on flat surface with minimal to no gait deviations including symmetrical L arm swing and increased internal rotation of LLE during gait.  (12-28-15)   Baseline met 12-28-15   Status Achieved               Plan - 12/29/15 1033    Clinical Impression Statement Pt has met all LTG's - has made excellent progress; pt wished to talk to MD before going to gym for exercise program, although he is ready and capable to make this transition from OPPT to gym for continued exercise program    Rehab Potential Good   PT Frequency 2x / week   PT Duration 4 weeks   PT Treatment/Interventions ADLs/Self Care Home Management;Gait training;Stair training;Functional mobility training;Therapeutic activities;Patient/family education;Neuromuscular re-education;Balance training;Therapeutic exercise   PT Next Visit Plan N/A   Consulted and Agree with Plan of Care Patient   Family Member Consulted mother      Patient will benefit from skilled therapeutic intervention in order to improve the following deficits and impairments:  Abnormal gait, Decreased balance, Decreased endurance, Decreased activity tolerance, Decreased coordination, Decreased strength  Visit Diagnosis: Other abnormalities of gait and mobility  Muscle weakness (generalized)     Problem List Patient Active Problem List   Diagnosis Date Noted  . GERD (gastroesophageal reflux disease) 11/09/2015  . Deep venous thrombosis (DVT) of left peroneal vein ([place]) 09/01/2015  . Right Basal ganglia hemorrhage ([place])   . Accelerated hypertension 08/16/2015  . Essential (primary) hypertension 08/16/2015     PHYSICAL THERAPY DISCHARGE SUMMARY  Visits from Start of  Care: 17  Current functional level related to goals / functional outcomes: See above for progress towards goals - all LTG's met   Remaining deficits: Decreased L SLS as pt able to perform for 2.65 secs , however, this is not affecting pt functionally as he is able to safely jog for short distances without LOB   Education / Equipment: Pt  has been instructed in HEP for LLE strengthening and high level balance activities;  I have recommended to pt that he continue exercise program at gym so that cardiovascular conditioning exercises are able to be continued, in addition to continued strengthening exs. to maintain progress achieved in PT. Plan: Patient agrees to discharge.  Patient goals were met. Patient is being discharged due to meeting the stated rehab goals.  ?????       Brad Mcgee, PT 12/29/2015, 10:37 AM  [place] [address] [place], [place], [address] Phone: [phone]   Fax:  [phone]  Name: Brad Mcgee MRN: [id number] Date of Birth: 12/29/1985

## 2016-01-03 ENCOUNTER — Other Ambulatory Visit: Payer: Self-pay | Admitting: Physical Medicine & Rehabilitation

## 2016-01-03 ENCOUNTER — Encounter: Payer: Self-pay | Admitting: Physical Medicine & Rehabilitation

## 2016-01-03 ENCOUNTER — Encounter: Payer: 59 | Attending: Physical Medicine & Rehabilitation | Admitting: Physical Medicine & Rehabilitation

## 2016-01-03 VITALS — BP 134/89 | HR 100 | Resp 14

## 2016-01-03 DIAGNOSIS — Z5189 Encounter for other specified aftercare: Secondary | ICD-10-CM | POA: Diagnosis not present

## 2016-01-03 DIAGNOSIS — Z86718 Personal history of other venous thrombosis and embolism: Secondary | ICD-10-CM | POA: Insufficient documentation

## 2016-01-03 DIAGNOSIS — I629 Nontraumatic intracranial hemorrhage, unspecified: Secondary | ICD-10-CM | POA: Diagnosis present

## 2016-01-03 DIAGNOSIS — I10 Essential (primary) hypertension: Secondary | ICD-10-CM | POA: Diagnosis not present

## 2016-01-03 DIAGNOSIS — N179 Acute kidney failure, unspecified: Secondary | ICD-10-CM | POA: Insufficient documentation

## 2016-01-03 DIAGNOSIS — Z9989 Dependence on other enabling machines and devices: Secondary | ICD-10-CM

## 2016-01-03 DIAGNOSIS — I82492 Acute embolism and thrombosis of other specified deep vein of left lower extremity: Secondary | ICD-10-CM | POA: Diagnosis not present

## 2016-01-03 DIAGNOSIS — I82452 Acute embolism and thrombosis of left peroneal vein: Secondary | ICD-10-CM

## 2016-01-03 DIAGNOSIS — G4733 Obstructive sleep apnea (adult) (pediatric): Secondary | ICD-10-CM | POA: Diagnosis not present

## 2016-01-03 DIAGNOSIS — I610 Nontraumatic intracerebral hemorrhage in hemisphere, subcortical: Secondary | ICD-10-CM

## 2016-01-03 DIAGNOSIS — D696 Thrombocytopenia, unspecified: Secondary | ICD-10-CM | POA: Diagnosis not present

## 2016-01-03 NOTE — Progress Notes (Signed)
Subjective:    Patient ID: Brad Mcgee, male    DOB: July 03, 1985, 30 y.o.   MRN: [id number]  HPI  30 y.o. male with history of hypertension presents for follow up after being discharged for basal ganglia hemorrhage.  Last clinic visit 10/05/15.  Since that time, he has d/ced Prozaac.  He is following up with Neurology, no further scans at present.  He was treated for and later evaluated for DVT with dopplers, which showed resolution.  He continues to take BP meds.  He had an incomplete sleep study, which he supposed to follow up for.  Since discharge he had completed HH therapies and is now cleared for outpatient therapies.    Pain Inventory Average Pain 0 Pain Right Now 0 My pain is no pain  In the last 24 hours, has pain interfered with the following? General activity 0 Relation with others 0 Enjoyment of life 0 What TIME of day is your pain at its worst? no pain Sleep (in general) Fair  Pain is worse with: no pain Pain improves with: no pain Relief from Meds: no pain  Mobility ability to climb steps?  yes do you drive?  no  Function Do you have any goals in this area?  yes  Neuro/Psych No problems in this area  Prior Studies hospital f/u  Physicians involved in your care Any changes since last visit?  no   Family History  Problem Relation Age of Onset  . Hypertension Mother   . Breast cancer Maternal Grandmother   . Hypertension Maternal Grandmother   . Prostate cancer Maternal Grandfather   . Hypertension Maternal Grandfather   . Hypertension Paternal Grandfather   . Diabetes Paternal Grandfather   . Multiple sclerosis Paternal Grandmother    Social History   Social History  . Marital status: Single    Spouse name: N/A  . Number of children: N/A  . Years of education: N/A   Social History Main Topics  . Smoking status: Never Smoker  . Smokeless tobacco: Never Used  . Alcohol use No  . Drug use: No  . Sexual activity: No   Other Topics Concern    . None   Social History Narrative  . None   Past Surgical History:  Procedure Laterality Date  . greenfield filter     Past Medical History:  Diagnosis Date  . CVA (cerebral vascular accident) (HCC)   . DVT (deep venous thrombosis) (HCC)   . Fracture of left ankle    treated with cast  . Hypertension    BP 134/89   Pulse 100   Resp 14   SpO2 98%   Opioid Risk Score:   Fall Risk Score:  `1  Depression screen PHQ 2/9  Depression screen [facility] 2/9 01/03/2016 11/09/2015 10/05/2015  Decreased Interest 0 0 0  Down, Depressed, Hopeless 0 0 0  PHQ - 2 Score 0 0 0  Altered sleeping - - 0  Tired, decreased energy - - 0  Change in appetite - - 0  Feeling bad or failure about yourself  - - 0  Trouble concentrating - - 0  Moving slowly or fidgety/restless - - 0  Suicidal thoughts - - 0  PHQ-9 Score - - 0   Review of Systems  Constitutional: Negative.   HENT: Negative.   Eyes: Negative.   Respiratory: Negative.   Cardiovascular: Negative.   Gastrointestinal: Negative.   Genitourinary: Negative.   Hematological: Negative.   Psychiatric/Behavioral: Negative.  All other systems reviewed and are negative.     Objective:   Physical Exam Constitutional: He appears well-developed and well-nourished. No distress. Obese HENT:  Normocephalic and atraumatic.   Eyes: Conjunctivae and EOM are normal.    Neck: Stoma healed.    Cardiovascular: Regular rate and rhythm.     Respiratory: Effort normal. No accessory muscle usage or stridor. No respiratory distress.    GI: Soft. Bowel sounds are normal. He exhibits no distension. There is no tenderness.  Musculoskeletal: He exhibits no edema or tenderness.  Gait: WNL  Neurological: He is alert and oriented.   Motor: RUE/RLE: 5/5 proximal to distal LUE: 4+-5/5 shoulder abduction, 4+-5/5 elbow flexion/extension, 4+-5/5 finger grip (improving) LLE: 5/5 proximal to distal. No increased tone noted.   Speech continues to improve Skin:  Skin is warm and dry. He is not diaphoretic.  Psychiatric: His mood and behavior appear normal.     Assessment & Plan:  30 y.o. male with history of hypertension presents for follow up right basal ganglia hemorrhage.  1.  Right basal ganglia hemorrhage  Cont follow up with Neurology in Aug.  Begin outpatient therapies  Pt requesting paperwork for disability  2.  Left peroneal DVT  Resolved on most recent doppler U/S  IVC filter placed on 06/16, will refer to VIR for evaluation for removal  3. HTN  Cont meds  4. OSA  Complete OSA study, pt following   5. Morbid obesity  Cont weight loss

## 2016-01-04 ENCOUNTER — Encounter: Payer: Self-pay | Admitting: Internal Medicine

## 2016-01-05 ENCOUNTER — Ambulatory Visit: Payer: 59 | Admitting: Physical Medicine & Rehabilitation

## 2016-01-05 ENCOUNTER — Telehealth: Payer: Self-pay | Admitting: Physical Medicine & Rehabilitation

## 2016-01-05 MED ORDER — LISINOPRIL 20 MG PO TABS: 20.0000 mg | ORAL_TABLET | Freq: Two times a day (BID) | ORAL | 5 refills | Status: DC

## 2016-01-05 NOTE — Telephone Encounter (Signed)
Brad Mcgee with [facility] needs to get a new order for a second sleep study.  He had one previously but was incomplete.  She will just need a new order for this put in, once it is put in, she will call patient and schedule this with him.  Her number is [phone].

## 2016-01-05 NOTE — Telephone Encounter (Signed)
Pt needs refill of lisinoprill, please advise if okay to refill as you have not filled as of yet

## 2016-01-05 NOTE — Telephone Encounter (Signed)
I spoke with Brad Mcgee again and Dr Allena KatzPatel is not going to be following the pt for the sleep disorder. PCP will need to order.

## 2016-01-08 NOTE — Telephone Encounter (Signed)
Yes he will need OV

## 2016-01-08 NOTE — Telephone Encounter (Signed)
Mother called back saying that she missed our call, presumably about this situation regarding the sleep study, outgoing call to mother was not documented, She is asking for us to call back

## 2016-01-08 NOTE — Telephone Encounter (Signed)
Pt needs a sleep study---please confirm that pt needs to have an OV to discuss so that you can put in referral to see Pulm

## 2016-01-10 NOTE — Telephone Encounter (Addendum)
No call was made from this office.  The previous message was attached to a conversation between sleep center about an order. Left message for Mrs Brad Mcgee informing her that we did not call her unless there was a robo call reminding of next week appt. No other contact made. It could have been PCP or Sleep Center trying to reach her,

## 2016-01-11 ENCOUNTER — Ambulatory Visit (INDEPENDENT_AMBULATORY_CARE_PROVIDER_SITE_OTHER): Payer: 59 | Admitting: Internal Medicine

## 2016-01-11 ENCOUNTER — Encounter: Payer: Self-pay | Admitting: Internal Medicine

## 2016-01-11 ENCOUNTER — Telehealth: Payer: Self-pay | Admitting: Internal Medicine

## 2016-01-11 VITALS — BP 120/70 | HR 68 | Temp 98.7°F | Wt 249.0 lb

## 2016-01-11 DIAGNOSIS — I10 Essential (primary) hypertension: Secondary | ICD-10-CM

## 2016-01-11 DIAGNOSIS — G4733 Obstructive sleep apnea (adult) (pediatric): Secondary | ICD-10-CM | POA: Diagnosis not present

## 2016-01-11 DIAGNOSIS — E6609 Other obesity due to excess calories: Secondary | ICD-10-CM

## 2016-01-11 DIAGNOSIS — IMO0001 Reserved for inherently not codable concepts without codable children: Secondary | ICD-10-CM

## 2016-01-11 DIAGNOSIS — Z6837 Body mass index (BMI) 37.0-37.9, adult: Secondary | ICD-10-CM | POA: Diagnosis not present

## 2016-01-11 NOTE — Telephone Encounter (Signed)
Shirlee LimerickMarion with [facility] would like to speak with someone regarding this pt sleep study that was performed on [facility] [address]  Please call.

## 2016-01-11 NOTE — Telephone Encounter (Signed)
LMOVM for Shirlee LimerickMarion to give me a call back.

## 2016-01-11 NOTE — Patient Instructions (Signed)
Sleep Apnea  Sleep apnea is a sleep disorder characterized by abnormal pauses in breathing while you sleep. When your breathing pauses, the level of oxygen in your blood decreases. This causes you to move out of deep sleep and into light sleep. As a result, your quality of sleep is poor, and the system that carries your blood throughout your body (cardiovascular system) experiences stress. If sleep apnea remains untreated, the following conditions can develop:  High blood pressure (hypertension).  Coronary artery disease.  Inability to achieve or maintain an erection (impotence).  Impairment of your thought process (cognitive dysfunction). There are three types of sleep apnea: 1. Obstructive sleep apnea--Pauses in breathing during sleep because of a blocked airway. 2. Central sleep apnea--Pauses in breathing during sleep because the area of the brain that controls your breathing does not send the correct signals to the muscles that control breathing. 3. Mixed sleep apnea--A combination of both obstructive and central sleep apnea. RISK FACTORS The following risk factors can increase your risk of developing sleep apnea:  Being overweight.  Smoking.  Having narrow passages in your nose and throat.  Being of older age.  Being male.  Alcohol use.  Sedative and tranquilizer use.  Ethnicity. Among individuals younger than 35 years, African Americans are at increased risk of sleep apnea. SYMPTOMS   Difficulty staying asleep.  Daytime sleepiness and fatigue.  Loss of energy.  Irritability.  Loud, heavy snoring.  Morning headaches.  Trouble concentrating.  Forgetfulness.  Decreased interest in sex.  Unexplained sleepiness. DIAGNOSIS  In order to diagnose sleep apnea, your caregiver will perform a physical examination. A sleep study done in the comfort of your own home may be appropriate if you are otherwise healthy. Your caregiver may also recommend that you spend the  night in a sleep lab. In the sleep lab, several monitors record information about your heart, lungs, and brain while you sleep. Your leg and arm movements and blood oxygen level are also recorded. TREATMENT The following actions may help to resolve mild sleep apnea:  Sleeping on your side.   Using a decongestant if you have nasal congestion.   Avoiding the use of depressants, including alcohol, sedatives, and narcotics.   Losing weight and modifying your diet if you are overweight. There also are devices and treatments to help open your airway:  Oral appliances. These are custom-made mouthpieces that shift your lower jaw forward and slightly open your bite. This opens your airway.  Devices that create positive airway pressure. This positive pressure "splints" your airway open to help you breathe better during sleep. The following devices create positive airway pressure:  Continuous positive airway pressure (CPAP) device. The CPAP device creates a continuous level of air pressure with an air pump. The air is delivered to your airway through a mask while you sleep. This continuous pressure keeps your airway open.  Nasal expiratory positive airway pressure (EPAP) device. The EPAP device creates positive air pressure as you exhale. The device consists of single-use valves, which are inserted into each nostril and held in place by adhesive. The valves create very little resistance when you inhale but create much more resistance when you exhale. That increased resistance creates the positive airway pressure. This positive pressure while you exhale keeps your airway open, making it easier to breath when you inhale again.  Bilevel positive airway pressure (BPAP) device. The BPAP device is used mainly in patients with central sleep apnea. This device is similar to the CPAP device because   it also uses an air pump to deliver continuous air pressure through a mask. However, with the BPAP machine, the  pressure is set at two different levels. The pressure when you exhale is lower than the pressure when you inhale.  Surgery. Typically, surgery is only done if you cannot comply with less invasive treatments or if the less invasive treatments do not improve your condition. Surgery involves removing excess tissue in your airway to create a wider passage way.   This information is not intended to replace advice given to you by your health care provider. Make sure you discuss any questions you have with your health care provider.   Document Released: 02/22/2002 Document Revised: 03/25/2014 Document Reviewed: 07/11/2011 Elsevier Interactive Patient Education 2016 Elsevier Inc.   

## 2016-01-11 NOTE — Progress Notes (Signed)
Subjective:    Patient ID: Brad MaxwellShawon M Cuddeback, male    DOB: 04/15/1985, 30 y.o.   MRN: [id number]  HPI  Pt presents to the clinic today requesting a sleep study. He report when he was in the hospital 08/2015, they advised him that he needed a sleep study. He was referred to Dr. Maple HudsonYoung and completed part 1 of the sleep study, but reports he was never able to go back to finish the part 2 of the sleep study. He called Dr. Roxy CedarYoung's office but was advised he would need a new referral. He reports he has been sleeping with a CPAP since 08/2015, when he was discharged from the hospital. He was never aware that he has sleep apnea. He does feel tired during the day. He does snore if he does not wear his CPAP. He is obese with a history of HTN.  Review of Systems  Past Medical History:  Diagnosis Date  . CVA (cerebral vascular accident) (HCC)   . DVT (deep venous thrombosis) (HCC)   . Fracture of left ankle    treated with cast  . Hypertension     Current Outpatient Prescriptions  Medication Sig Dispense Refill  . amLODipine (NORVASC) 5 MG tablet Take 1 tablet by mouth 2 (two) times daily.    . hydrochlorothiazide (HYDRODIURIL) 12.5 MG tablet Take 1 tablet by mouth daily.    Marland Kitchen. lisinopril (PRINIVIL,ZESTRIL) 20 MG tablet Take 1 tablet (20 mg total) by mouth 2 (two) times daily. 60 tablet 5  . Multiple Vitamin (MULTIVITAMIN WITH MINERALS) TABS tablet Take 1 tablet by mouth daily. 100 tablet 0   No current facility-administered medications for this visit.     Allergies  Allergen Reactions  . Bee Venom Anaphylaxis  . Coconut Oil Anaphylaxis  . Ancef [Cefazolin] Rash    Family History  Problem Relation Age of Onset  . Hypertension Mother   . Breast cancer Maternal Grandmother   . Hypertension Maternal Grandmother   . Prostate cancer Maternal Grandfather   . Hypertension Maternal Grandfather   . Hypertension Paternal Grandfather   . Diabetes Paternal Grandfather   . Multiple sclerosis Paternal  Grandmother     Social History   Social History  . Marital status: Single    Spouse name: N/A  . Number of children: N/A  . Years of education: N/A   Occupational History  . Not on file.   Social History Main Topics  . Smoking status: Never Smoker  . Smokeless tobacco: Never Used  . Alcohol use No  . Drug use: No  . Sexual activity: No   Other Topics Concern  . Not on file   Social History Narrative  . No narrative on file     Constitutional: Denies fever, malaise, fatigue, headache or abrupt weight changes.  Respiratory: Denies difficulty breathing, shortness of breath, cough or sputum production.   Cardiovascular: Denies chest pain, chest tightness, palpitations or swelling in the hands or feet.  Neurological: Denies dizziness, difficulty with memory, difficulty with speech or problems with balance and coordination.    No other specific complaints in a complete review of systems (except as listed in HPI above).     Objective:   Physical Exam   BP 120/70   Pulse 68   Temp 98.7 F (37.1 C) (Oral)   Wt 249 lb (112.9 kg)   SpO2 98%   BMI 37.86 kg/m  Wt Readings from Last 3 Encounters:  01/11/16 249 lb (112.9 kg)  11/13/15  253 lb 6.4 oz (114.9 kg)  11/09/15 254 lb 8 oz (115.4 kg)    General: Appears his stated age, obese in NAD. Neck:  18 inches in circumference. Cardiovascular: Normal rate and rhythm. S1,S2 noted.  No murmur, rubs or gallops noted.  Pulmonary/Chest: Normal effort and positive vesicular breath sounds. No respiratory distress. No wheezes, rales or ronchi noted.  Neurological: Alert and oriented.    BMET    Component Value Date/Time   NA 137 09/28/2015 1434   K 3.3 (L) 09/28/2015 1434   CL 103 09/28/2015 1434   CO2 27 09/28/2015 1434   GLUCOSE 86 09/28/2015 1434   BUN <5 (L) 09/28/2015 1434   CREATININE 0.87 09/28/2015 1434   CALCIUM 9.4 09/28/2015 1434   GFRNONAA >60 09/28/2015 1434   GFRAA >60 09/28/2015 1434    Lipid Panel       Component Value Date/Time   TRIG 331 (H) 08/22/2015 0526    CBC    Component Value Date/Time   WBC 5.4 09/28/2015 1434   RBC 3.62 (L) 09/28/2015 1434   HGB 11.4 (L) 09/28/2015 1434   HCT 34.5 (L) 09/28/2015 1434   PLT 279 09/28/2015 1434   MCV 95.3 09/28/2015 1434   MCH 31.5 09/28/2015 1434   MCHC 33.0 09/28/2015 1434   RDW 13.2 09/28/2015 1434   LYMPHSABS 1.9 09/28/2015 1434   MONOABS 0.4 09/28/2015 1434   EOSABS 0.1 09/28/2015 1434   BASOSABS 0.0 09/28/2015 1434    Hgb A1C Lab Results  Component Value Date   HGBA1C 5.6 09/02/2015       Assessment & Plan:   Obese, HTN, and OSA:  ESS: 5 Referral placed to pulmonology to have sleep study repeated He will see Shirlee Limerick on the way out to schedule appt Encouraged him to work on diet and exercise to lose weight- which may help with HTN and OSA  RTC as previously scheduled Nicki Reaper, NP

## 2016-01-12 NOTE — Telephone Encounter (Signed)
Spoke with Shirlee LimerickMarion- advised consult with physician would be the best to start the patient off with, especially if he needs cpap machine and supplies. Scheduled pt with Dr. Nicholos Johnsamachandran on 01/25/16-pr

## 2016-01-15 ENCOUNTER — Encounter: Payer: Self-pay | Admitting: Physical Medicine & Rehabilitation

## 2016-01-16 ENCOUNTER — Ambulatory Visit
Admission: RE | Admit: 2016-01-16 | Discharge: 2016-01-16 | Disposition: A | Discharge status: Home / Self Care | Payer: 59 | Source: Ambulatory Visit | Attending: Physical Medicine & Rehabilitation | Admitting: Physical Medicine & Rehabilitation

## 2016-01-16 DIAGNOSIS — I82452 Acute embolism and thrombosis of left peroneal vein: Secondary | ICD-10-CM

## 2016-01-16 NOTE — Progress Notes (Signed)
Chief Complaint: Patient was seen in consultation today for presence of IVC filter at the request of Patel,Brad Mcgee  Referring Physician(s): Patel,Brad Mcgee  History of Present Illness: Brad Mcgee is a 30 y.o. male status post right basal ganglia hemorrhagic stroke which he suffered in May 2017.  During his hospital stay, an isolated left calf vein (peroneal) DVT was identified on 08/31/2015.  Given an absolute contraindication to anticoagulation, an IVC filter was placed by interventional radiology (Dr. Bonnielee HaffHoss) later that same day 08/31/2015.  Brad Mcgee presents today to evaluate for possible filter retrieval. He has made a remarkable recovery given the extent of his prior right basal ganglia hemorrhage. He is fully ambulatory and continues to work on his left arm and leg strength and coordination both on his own, and in physical therapy. He goes to the gym nearly every day and exercises on the stationary bike, treadmill, elliptical machine and performs leg press.  He is not currently on any anticoagulation. A bilateral lower extremity duplex ultrasound performed today demonstrates no evidence of acute or chronic DVT. His previously noted isolated peroneal vein DVT has resolved. He denies issues with leg pain or swelling. He has no complaint of chest pain or shortness of breath.  Past Medical History:  Diagnosis Date  . CVA (cerebral vascular accident) (HCC)   . DVT (deep venous thrombosis) (HCC)   . Fracture of left ankle    treated with cast  . Hypertension     Past Surgical History:  Procedure Laterality Date  . greenfield filter      Allergies: Bee venom; Coconut oil; and Ancef [cefazolin]  Medications: Prior to Admission medications   Medication Sig Start Date End Date Taking? Authorizing Provider  amLODipine (NORVASC) 5 MG tablet Take 1 tablet by mouth 2 (two) times daily. 11/08/15 11/07/16 Yes Historical Provider, MD  aspirin 81 MG tablet Take 81 mg by mouth  daily.   Yes Historical Provider, MD  hydrochlorothiazide (HYDRODIURIL) 12.5 MG tablet Take 1 tablet by mouth daily. 11/08/15  Yes Historical Provider, MD  lisinopril (PRINIVIL,ZESTRIL) 20 MG tablet Take 1 tablet (20 mg total) by mouth 2 (two) times daily. 01/05/16  Yes Lorre Munroeegina W Baity, NP  Multiple Vitamin (MULTIVITAMIN WITH MINERALS) TABS tablet Take 1 tablet by mouth daily. 09/21/15  Yes Jacquelynn CreePamela S Love, PA-C     Family History  Problem Relation Age of Onset  . Hypertension Mother   . Breast cancer Maternal Grandmother   . Hypertension Maternal Grandmother   . Prostate cancer Maternal Grandfather   . Hypertension Maternal Grandfather   . Hypertension Paternal Grandfather   . Diabetes Paternal Grandfather   . Multiple sclerosis Paternal Grandmother     Social History   Social History  . Marital status: Single    Spouse name: N/A  . Number of children: N/A  . Years of education: N/A   Social History Main Topics  . Smoking status: Never Smoker  . Smokeless tobacco: Never Used  . Alcohol use No  . Drug use: No  . Sexual activity: No   Other Topics Concern  . Not on file   Social History Narrative  . No narrative on file    Review of Systems: A 12 point ROS discussed and pertinent positives are indicated in the HPI above.  All other systems are negative.  Review of Systems  Vital Signs: BP 114/69 (BP Location: Left Arm, Patient Position: Sitting, Cuff Size: Large)   Pulse 84  Temp 99 F (37.2 C) (Oral)   Resp 14   Ht 5\' 8"  (1.727 m)   Wt 249 lb (112.9 kg)   SpO2 100%   BMI 37.86 kg/m   Physical Exam  Constitutional: He is oriented to person, place, and time. He appears well-developed and well-nourished. No distress.  HENT:  Head: Normocephalic and atraumatic.  Eyes: No scleral icterus.  Cardiovascular: Normal rate and regular rhythm.   Pulmonary/Chest: Effort normal and breath sounds normal.  Abdominal: Soft. He exhibits no distension. There is no tenderness.    Neurological: He is alert and oriented to person, place, and time.  Skin: Skin is warm and dry.  Psychiatric: He has a normal mood and affect. His behavior is normal.  Nursing note and vitals reviewed.    Imaging: No results found.  Labs:  CBC:  Recent Labs  09/21/15 0531 09/22/15 0541 09/23/15 0607 09/28/15 1434  WBC 4.9 4.6 4.6 5.4  HGB 10.1* 10.5* 10.5* 11.4*  HCT 29.6* 30.8* 30.9* 34.5*  PLT 114* 130* 138* 279    COAGS:  Recent Labs  08/15/15 2245 08/23/15 1100  INR 0.98 1.18  APTT 32  --     BMP:  Recent Labs  09/12/15 0629 09/18/15 0933 09/19/15 0536 09/28/15 1434  NA 129* 132* 133* 137  K 4.7 4.0 4.2 3.3*  CL 94* 101 102 103  CO2 24 23 23 27   GLUCOSE 103* 130* 99 86  BUN 32* 15 14 <5*  CALCIUM 10.1 9.6 9.2 9.4  CREATININE 1.19 1.01 1.01 0.87  GFRNONAA >60 >60 >60 >60  GFRAA >60 >60 >60 >60    LIVER FUNCTION TESTS:  Recent Labs  08/15/15 2245  08/21/15 0555 08/22/15 0500 09/02/15 0340 09/28/15 1434  BILITOT 0.4  --   --   --  0.3 0.7  AST 23  --   --   --  27 26  ALT 20  --   --   --  43 31  ALKPHOS 58  --   --   --  46 46  PROT 7.1  --   --   --  7.2 6.6  ALBUMIN 4.3  < > 2.9* 2.5* 3.0* 3.6  < > = values in this interval not displayed.  TUMOR MARKERS: No results for input(s): AFPTM, CEA, CA199, CHROMGRNA in the last 8760 hours.  Assessment and Plan:  30 year old gentleman with a history of right basal ganglia hemorrhagic stroke and isolated calf vein DVT requiring IVC filter placement. He has undergone a remarkable recovery and is doing very well and fully ambulatory. Bilateral lower extremity clicks venous ultrasound performed today demonstrates complete resolution of the previously noted DVT and no evidence of new or chronic DVT.  He has undergone adequate PE prophylaxis for 3 months following his provoked calf DVT and no longer requires the presence of the IVC filter.  1.) I will have my office staff schedule him for IVC  filter retrieval to be performed as soon as possible at [facility].    Electronically Signed: Malachy MoanMCCULLOUGH, HEATH 01/16/2016, 10:08 AM   I spent a total of  15 Minutes in face to face in clinical consultation, greater than 50% of which was counseling/coordinating care for presence of IVC filter.

## 2016-01-18 ENCOUNTER — Other Ambulatory Visit (HOSPITAL_COMMUNITY): Payer: Self-pay | Admitting: Interventional Radiology

## 2016-01-18 DIAGNOSIS — I82452 Acute embolism and thrombosis of left peroneal vein: Secondary | ICD-10-CM

## 2016-01-23 NOTE — Progress Notes (Signed)
[facility]      Assessment and Plan:  Excessive daytime sleepiness.  -This is improved since the patient has been started on empiric CPAP.  Obstructive sleep apnea.  -HST 10/17/15 AHI=36 -He has completed the initial sleep study, we'll send for a titration study. -He's already been fitted with a mask, he can continue to use that with future machine.  Essential hypertension.  -Sleep apnea can contribute to essential hypertension, therefore, it is important to treat this at present.  Stroke.  -The patient suffered a a hemorrhagic stroke thought to be related to uncontrolled hypertension.  Date: 01/23/2016  MRN# [id number] Brad Mcgee 05/09/1985   Brad Mcgee is a 30 y.o. old male seen in consultation for chief complaint of:    Chief Complaint  Patient presents with  . SLEEP CONSULT    [facility] prior sleep study on 10-17-15. pt currently wears cpap avg 8hr nightly. pt unsure if pressure is okay.     HPI:   Pt is status post right basal ganglia hemorrhagic stroke which he suffered in May 2017, also had a IVC filter placed for peroneal vein thrombosis, awaiting IVC retreival.  He was discharged from the hospital in the past with CPAP, he underwent an initial in-lab sleep study on 8/1 which showed OSA. He was fitted with a resmed medium airfit 30 FFM at that time.  His mother is present today and gives some of the history. He does snores and night and has some apnea spells. He is tolerating the cpap well.  He did not undergo a titration study yet. [facility]   Review of sleep study data: HST 10/17/15 AHI=36  PMHX:   Past Medical History:  Diagnosis Date  . CVA (cerebral vascular accident) (HCC)   . DVT (deep venous thrombosis) (HCC)   . Fracture of left ankle    treated with cast  . Hypertension    Surgical Hx:  Past Surgical History:  Procedure Laterality Date  . greenfield filter     Family Hx:    Family History  Problem Relation Age of Onset  . Hypertension Mother   . Breast cancer Maternal Grandmother   . Hypertension Maternal Grandmother   . Prostate cancer Maternal Grandfather   . Hypertension Maternal Grandfather   . Hypertension Paternal Grandfather   . Diabetes Paternal Grandfather   . Multiple sclerosis Paternal Grandmother    Social Hx:   Social History  Substance Use Topics  . Smoking status: Never Smoker  . Smokeless tobacco: Never Used  . Alcohol use No   Medication:   Reviewed.     Allergies:  Bee venom; Coconut oil; and Ancef [cefazolin]  Review of Systems: Gen:  Denies  fever, sweats, chills HEENT: Denies blurred vision, double vision. bleeds, sore throat Cvc:  No dizziness, chest pain. Resp:   Denies cough or sputum production, shortness of breath Gi: Denies swallowing difficulty, stomach pain. Gu:  Denies bladder incontinence, burning urine Ext:   No Joint pain, stiffness. Skin: No skin rash,  hives  Endoc:  No polyuria, polydipsia. Psych: No depression, insomnia. Other:  All other systems were reviewed with the patient and were negative other that what is mentioned in the HPI.   Physical Examination:   VS: BP 128/72 (BP Location: Left Arm, Cuff Size: Normal)   Pulse 88   Ht 5\' 7"  (1.702 m)   Wt 245 lb (111.1 kg)   SpO2  99%   BMI 38.37 kg/m   General Appearance: No distress  Neuro:without focal findings,  speech normal,  HEENT: PERRLA, EOM intact.  Mallampati 4 Pulmonary: normal breath sounds, No wheezing.  CardiovascularNormal S1,S2.  No m/r/g.   Abdomen: Benign, Soft, non-tender. Renal:  No costovertebral tenderness  GU:  No performed at this time. Endoc: No evident thyromegaly, no signs of acromegaly. Skin:   warm, no rashes, no ecchymosis  Extremities: normal, no cyanosis, clubbing.  Other findings:    LABORATORY PANEL:   CBC No results for input(s): WBC, HGB, HCT, PLT in the last 168  hours. ------------------------------------------------------------------------------------------------------------------  Chemistries  No results for input(s): NA, K, CL, CO2, GLUCOSE, BUN, CREATININE, CALCIUM, MG, AST, ALT, ALKPHOS, BILITOT in the last 168 hours.  Invalid input(s): GFRCGP ------------------------------------------------------------------------------------------------------------------  Cardiac Enzymes No results for input(s): TROPONINI in the last 168 hours. ------------------------------------------------------------  RADIOLOGY:  No results found.     Thank  you for the consultation and for allowing [facility] to assist in the care of your patient. Our recommendations are noted above.  Please contact us if we can be of further service.   Wells Guileseep Chrisoula Zegarra, MD.  Board Certified in Internal Medicine, Pulmonary Medicine, Critical Care Medicine, and Sleep Medicine.  [facility] Office Number: [phone]  Santiago Gladavid Kasa, M.D.  Stephanie AcreVishal Mungal, M.D.  Billy Fischeravid Simonds, M.D  01/23/2016

## 2016-01-25 ENCOUNTER — Encounter: Payer: Self-pay | Admitting: Internal Medicine

## 2016-01-25 ENCOUNTER — Other Ambulatory Visit: Payer: Self-pay | Admitting: Radiology

## 2016-01-25 ENCOUNTER — Ambulatory Visit (INDEPENDENT_AMBULATORY_CARE_PROVIDER_SITE_OTHER): Payer: 59 | Admitting: Internal Medicine

## 2016-01-25 VITALS — BP 128/72 | HR 88 | Ht 67.0 in | Wt 245.0 lb

## 2016-01-25 DIAGNOSIS — Z0271 Encounter for disability determination: Secondary | ICD-10-CM

## 2016-01-25 DIAGNOSIS — G4733 Obstructive sleep apnea (adult) (pediatric): Secondary | ICD-10-CM | POA: Diagnosis not present

## 2016-01-25 NOTE — Patient Instructions (Addendum)
--   Will send for titration study.    Sleep Apnea Sleep apnea is disorder that affects a person's sleep. A person with sleep apnea has abnormal pauses in their breathing when they sleep. It is hard for them to get a good sleep. This makes a person tired during the day. It also can lead to other physical problems. There are three types of sleep apnea. One type is when breathing stops for a short time because your airway is blocked (obstructive sleep apnea). Another type is when the brain sometimes fails to give the normal signal to breathe to the muscles that control your breathing (central sleep apnea). The third type is a combination of the other two types. HOME CARE   Take all medicine as told by your doctor.  Avoid alcohol, calming medicines (sedatives), and depressant drugs.  Try to lose weight if you are overweight. Talk to your doctor about a healthy weight goal.  Your doctor may have you use a device that helps to open your airway. It can help you get the air that you need. It is called a positive airway pressure (PAP) device.   MAKE SURE YOU:   Understand these instructions.  Will watch your condition.  Will get help right away if you are not doing well or get worse.  It may take approximately 1 month for you to get used to wearing her CPAP every night.  Be sure to work with your machine to get used to it, be patient, it may take time!

## 2016-01-26 ENCOUNTER — Encounter (HOSPITAL_COMMUNITY): Payer: Self-pay

## 2016-01-26 ENCOUNTER — Ambulatory Visit (HOSPITAL_COMMUNITY)
Admission: RE | Admit: 2016-01-26 | Discharge: 2016-01-26 | Disposition: A | Discharge status: Home / Self Care | Payer: 59 | Source: Ambulatory Visit | Attending: Interventional Radiology | Admitting: Interventional Radiology

## 2016-01-26 DIAGNOSIS — Z8249 Family history of ischemic heart disease and other diseases of the circulatory system: Secondary | ICD-10-CM | POA: Diagnosis not present

## 2016-01-26 DIAGNOSIS — Z7982 Long term (current) use of aspirin: Secondary | ICD-10-CM | POA: Insufficient documentation

## 2016-01-26 DIAGNOSIS — Z4589 Encounter for adjustment and management of other implanted devices: Secondary | ICD-10-CM | POA: Insufficient documentation

## 2016-01-26 DIAGNOSIS — I82452 Acute embolism and thrombosis of left peroneal vein: Secondary | ICD-10-CM

## 2016-01-26 DIAGNOSIS — I10 Essential (primary) hypertension: Secondary | ICD-10-CM | POA: Insufficient documentation

## 2016-01-26 DIAGNOSIS — Z79899 Other long term (current) drug therapy: Secondary | ICD-10-CM | POA: Insufficient documentation

## 2016-01-26 DIAGNOSIS — Z86718 Personal history of other venous thrombosis and embolism: Secondary | ICD-10-CM | POA: Insufficient documentation

## 2016-01-26 DIAGNOSIS — Z8673 Personal history of transient ischemic attack (TIA), and cerebral infarction without residual deficits: Secondary | ICD-10-CM | POA: Diagnosis not present

## 2016-01-26 HISTORY — PX: IR GENERIC HISTORICAL: IMG1180011

## 2016-01-26 LAB — BASIC METABOLIC PANEL
Anion gap: 12 (ref 5–15)
BUN: 12 mg/dL (ref 6–20)
CO2: 24 mmol/L (ref 22–32)
Calcium: 10 mg/dL (ref 8.9–10.3)
Chloride: 102 mmol/L (ref 101–111)
Creatinine, Ser: 1.12 mg/dL (ref 0.61–1.24)
GFR calc Af Amer: >60 mL/min (ref >60)
GFR calc non Af Amer: >60 mL/min (ref >60)
Glucose, Bld: 90 mg/dL (ref 65–99)
Potassium: 3.6 mmol/L (ref 3.5–5.1)
Sodium: 138 mmol/L (ref 135–145)

## 2016-01-26 LAB — CBC
HCT: 41.3 % (ref 39.0–52.0)
Hemoglobin: 14.4 g/dL (ref 13.0–17.0)
MCH: 32 pg (ref 26.0–34.0)
MCHC: 34.9 g/dL (ref 30.0–36.0)
MCV: 91.8 fL (ref 78.0–100.0)
Platelets: 210 10*3/uL (ref 150–400)
RBC: 4.5 MIL/uL (ref 4.22–5.81)
RDW: 12.6 % (ref 11.5–15.5)
WBC: 5.4 10*3/uL (ref 4.0–10.5)

## 2016-01-26 LAB — PROTIME-INR
INR: 1.04
Prothrombin Time: 13.6 seconds (ref 11.4–15.2)

## 2016-01-26 LAB — APTT: aPTT: 35 seconds (ref 24–36)

## 2016-01-26 MED ORDER — LIDOCAINE HCL 1 % IJ SOLN
INTRAMUSCULAR | Status: AC | PRN
  Administered 2016-01-26: 10 mL

## 2016-01-26 MED ORDER — SODIUM CHLORIDE 0.9 % IV SOLN
INTRAVENOUS | Status: AC | PRN
  Administered 2016-01-26: 75 mL/h via INTRAVENOUS

## 2016-01-26 MED ORDER — MIDAZOLAM HCL 2 MG/2ML IJ SOLN
INTRAMUSCULAR | Status: AC | PRN
  Administered 2016-01-26: 0.5 mg via INTRAVENOUS
  Administered 2016-01-26: 1 mg via INTRAVENOUS

## 2016-01-26 MED ORDER — FENTANYL CITRATE (PF) 100 MCG/2ML IJ SOLN
INTRAMUSCULAR | Status: AC
  Filled 2016-01-26: qty 2

## 2016-01-26 MED ORDER — IOPAMIDOL (ISOVUE-300) INJECTION 61%
INTRAVENOUS | Status: AC
  Administered 2016-01-26: 45 mL
  Filled 2016-01-26: qty 100

## 2016-01-26 MED ORDER — LIDOCAINE HCL 1 % IJ SOLN
INTRAMUSCULAR | Status: AC
  Filled 2016-01-26: qty 20

## 2016-01-26 MED ORDER — FENTANYL CITRATE (PF) 100 MCG/2ML IJ SOLN
INTRAMUSCULAR | Status: AC | PRN
  Administered 2016-01-26: 25 ug via INTRAVENOUS
  Administered 2016-01-26: 50 ug via INTRAVENOUS

## 2016-01-26 MED ORDER — SODIUM CHLORIDE 0.9 % IV SOLN: INTRAVENOUS | Status: DC

## 2016-01-26 MED ORDER — MIDAZOLAM HCL 2 MG/2ML IJ SOLN
INTRAMUSCULAR | Status: AC
  Filled 2016-01-26: qty 2

## 2016-01-26 NOTE — Sedation Documentation (Signed)
Patient is resting comfortably. 

## 2016-01-26 NOTE — Discharge Instructions (Signed)
IVC filter removal, Care After Refer to this sheet in the next few weeks. These instructions provide you with information on caring for yourself after your procedure. Your health care provider may also give you more specific instructions. Your treatment has been planned according to current medical practices, but problems sometimes occur. Call your health care provider if you have any problems or questions after your procedure. WHAT TO EXPECT AFTER THE PROCEDURE After your procedure, it is typical to have the following sensations:  Mild discomfort at the catheter insertion site. HOME CARE INSTRUCTIONS   Take all medicines exactly as directed.  Follow any prescribed diet.  Follow instructions regarding both rest and physical activity.  Drink more fluids for the first several days after the procedure in order to help flush dye from your kidneys. SEEK MEDICAL CARE IF:  You develop a rash.  You have fever not controlled by medicine. SEEK IMMEDIATE MEDICAL CARE IF:  There is pain, drainage, bleeding, redness, swelling, warmth or a red streak at the site of the IV tube.  The extremity where your IV tube was placed becomes discolored, numb, or cool.  You have difficulty breathing or shortness of breath.  You develop chest pain.  You have excessive dizziness or fainting.   This information is not intended to replace advice given to you by your health care provider. Make sure you discuss any questions you have with your health care provider.   Document Released: 12/23/2012 Document Revised: 03/09/2013 Document Reviewed: 12/23/2012 Elsevier Interactive Patient Education [employer]2016 Elsevier Inc.

## 2016-01-26 NOTE — Sedation Documentation (Signed)
Dr Fredia SorrowYamagata at bedside.  Explaining pprocedure and questions answered.  Pt's mother is present.

## 2016-01-26 NOTE — Sedation Documentation (Signed)
Moved to IR 1, o2 2l/[place] applied

## 2016-01-26 NOTE — H&P (Signed)
Chief Complaint: Patient was seen in consultation today for inferior vena cava filter retrieval at the request of Brad Mcgee,Brad Mcgee  Referring Physician(s): Dr Brad Mcgee  Supervising Physician: Brad Mcgee  Patient Status: [facility] - Out-pt  History of Present Illness: [name] is a 30 y.o. male   Suffered hemorrhagic CVA 07/2015 Developed LLE DVT June Due to contraindication of anticoagulation - pt was deemed appropriate for retrievable inferior vena cava filter placement Placed 08/31/2015  Pt was released on Brad Mcgee daily Discontinued after 2- 3 months  Was seen in consultation in IR clinic 01/16/2016 [place] was negative for thrombus IMPRESSION: No evidence of deep venous thrombosis. The previously reported left peroneal vein DVT is no longer evident and has presumably resolved.  Now scheduled for retrieval per Dr Brad Mcgee  Past Medical History:  Diagnosis Date  . CVA (cerebral vascular accident) (HCC)   . DVT (deep venous thrombosis) (HCC)   . Fracture of left ankle    treated with cast  . Hypertension     Past Surgical History:  Procedure Laterality Date  . greenfield filter      Allergies: Bee venom; Coconut oil; and Ancef [cefazolin]  Medications: Prior to Admission medications   Medication Sig Start Date End Date Taking? Authorizing Provider  amLODipine (NORVASC) 5 MG tablet Take 1 tablet by mouth 2 (two) times daily. 11/08/15 11/07/16 Yes Historical Provider, MD  aspirin 81 MG tablet Take 81 mg by mouth daily.   Yes Historical Provider, MD  hydrochlorothiazide (HYDRODIURIL) 12.5 MG tablet Take 1 tablet by mouth daily. 11/08/15  Yes Historical Provider, MD  lisinopril (PRINIVIL,ZESTRIL) 20 MG tablet Take 1 tablet (20 mg total) by mouth 2 (two) times daily. 01/05/16  Yes Brad Munroe, NP  Brad Vitamin (MULTIVITAMIN WITH Mcgee) TABS tablet Take 1 tablet by mouth daily. 09/21/15  Yes Brad Cree, PA-C     Family History  Problem Relation Age  of Onset  . Hypertension Mother   . Breast cancer Maternal Grandmother   . Hypertension Maternal Grandmother   . Prostate cancer Maternal Grandfather   . Hypertension Maternal Grandfather   . Hypertension Paternal Grandfather   . Diabetes Paternal Grandfather   . Brad sclerosis Paternal Grandmother     Social History   Social History  . Marital status: Single    Spouse name: N/A  . Number of children: N/A  . Years of education: N/A   Social History Main Topics  . Smoking status: Never Smoker  . Smokeless tobacco: Never Used  . Alcohol use No  . Drug use: No  . Sexual activity: No   Other Topics Concern  . None   Social History Narrative  . None     Review of Systems: A 12 point ROS discussed and pertinent positives are indicated in the HPI above.  All other systems are negative.  Review of Systems  Constitutional: Negative for activity change and fatigue.  Neurological: Positive for weakness.  Psychiatric/Behavioral: Negative for behavioral problems and confusion.    Vital Signs: BP 133/76   Pulse 94   Temp 98.3 F (36.8 C)   Resp 16   Ht 5\' 7"  (1.702 m)   Wt 245 lb (111.1 kg)   SpO2 100%   BMI 38.37 kg/m   Physical Exam  Constitutional: He is oriented to person, place, and time.  Cardiovascular: Normal rate, regular rhythm and normal heart sounds.   Pulmonary/Chest: Effort normal and breath sounds normal.  Abdominal: Soft. Bowel sounds  are normal.  Musculoskeletal: Normal range of motion. He exhibits no edema.  Neurological: He is alert and oriented to person, place, and time.  Skin: Skin is warm and dry.  Psychiatric: He has a normal mood and affect. His behavior is normal. Judgment and thought content normal.  Nursing note and vitals reviewed.   Mallampati Score:  MD Evaluation Airway: WNL Heart: WNL Abdomen: WNL Chest/ Lungs: WNL Mcgee  Classification: 2 Mallampati/Airway Score: Two  Imaging: [place] Venous Img Lower Bilateral  Result  Date: 01/16/2016 CLINICAL DATA:  30 year old male with a history of isolated, provoked left calf DVT diagnosed in in June 2017. EXAM: BILATERAL LOWER EXTREMITY VENOUS DOPPLER ULTRASOUND TECHNIQUE: Gray-scale sonography with graded compression, as well as color Doppler and duplex ultrasound were performed to evaluate the lower extremity deep venous systems from the level of the common femoral vein and including the common femoral, femoral, profunda femoral, popliteal and calf veins including the posterior tibial, peroneal and gastrocnemius veins when visible. The superficial great saphenous vein was also interrogated. Spectral Doppler was utilized to evaluate flow at rest and with distal augmentation maneuvers in the common femoral, femoral and popliteal veins. COMPARISON:  None. FINDINGS: RIGHT LOWER EXTREMITY Common Femoral Vein: No evidence of thrombus. Normal compressibility, respiratory phasicity and response to augmentation. Saphenofemoral Junction: No evidence of thrombus. Normal compressibility and flow on color Doppler imaging. Profunda Femoral Vein: No evidence of thrombus. Normal compressibility and flow on color Doppler imaging. Femoral Vein: No evidence of thrombus. Normal compressibility, respiratory phasicity and response to augmentation. Popliteal Vein: No evidence of thrombus. Normal compressibility, respiratory phasicity and response to augmentation. Calf Veins: No evidence of thrombus. Normal compressibility and flow on color Doppler imaging. Superficial Great Saphenous Vein: No evidence of thrombus. Normal compressibility and flow on color Doppler imaging. Venous Reflux:  None. Other Findings:  None. LEFT LOWER EXTREMITY Common Femoral Vein: No evidence of thrombus. Normal compressibility, respiratory phasicity and response to augmentation. Saphenofemoral Junction: No evidence of thrombus. Normal compressibility and flow on color Doppler imaging. Profunda Femoral Vein: No evidence of thrombus.  Normal compressibility and flow on color Doppler imaging. Femoral Vein: No evidence of thrombus. Normal compressibility, respiratory phasicity and response to augmentation. Popliteal Vein: No evidence of thrombus. Normal compressibility, respiratory phasicity and response to augmentation. Calf Veins: No evidence of thrombus. Normal compressibility and flow on color Doppler imaging. Superficial Great Saphenous Vein: No evidence of thrombus. Normal compressibility and flow on color Doppler imaging. Venous Reflux:  None. Other Findings:  None. IMPRESSION: No evidence of deep venous thrombosis. The previously reported left peroneal vein DVT is no longer evident and has presumably resolved. Electronically Signed   By: Malachy MoanHeath  Brad Mcgee M.D.   On: 01/16/2016 10:45    Labs:  CBC:  Recent Labs  09/22/15 0541 09/23/15 0607 09/28/15 1434 01/26/16 1225  WBC 4.6 4.6 5.4 5.4  HGB 10.5* 10.5* 11.4* 14.4  HCT 30.8* 30.9* 34.5* 41.3  PLT 130* 138* 279 210    COAGS:  Recent Labs  08/15/15 2245 08/23/15 1100  INR 0.98 1.18  APTT 32  --     BMP:  Recent Labs  09/12/15 0629 09/18/15 0933 09/19/15 0536 09/28/15 1434  NA 129* 132* 133* 137  K 4.7 4.0 4.2 3.3*  CL 94* 101 102 103  CO2 24 23 23 27   GLUCOSE 103* 130* 99 86  BUN 32* 15 14 <5*  CALCIUM 10.1 9.6 9.2 9.4  CREATININE 1.19 1.01 1.01 0.87  GFRNONAA >60 >60 >60 >  60  GFRAA >60 >60 >60 >60    LIVER FUNCTION TESTS:  Recent Labs  08/15/15 2245  08/21/15 0555 08/22/15 0500 09/02/15 0340 09/28/15 1434  BILITOT 0.4  --   --   --  0.3 0.7  AST 23  --   --   --  27 26  ALT 20  --   --   --  43 31  ALKPHOS 58  --   --   --  46 46  PROT 7.1  --   --   --  7.2 6.6  ALBUMIN 4.3  < > 2.9* 2.5* 3.0* 3.6  < > = values in this interval not displayed.  TUMOR MARKERS: No results for input(s): AFPTM, CEA, CA199, CHROMGRNA in the last 8760 hours.  Assessment and Plan:  Retrievable IVC filter placed 08/31/15 Scheduled for removal  today Pt is aware of procedure benefits and risks including but not limited to: Infection; bleeding; vessel damage; damage to surrounding structures Agreeable to proceed Consent  signed andin chart  Thank you for this interesting consult.  I greatly enjoyed meeting Brad Mcgee and look forward to participating in their care.  A copy of this report was sent to the requesting provider on this date.  Electronically Signed: Mally Gavina A 01/26/2016, 1:32 PM   I spent a total of  30 Minutes   in face to face in clinical consultation, greater than 50% of which was counseling/coordinating care for IVC filter removal

## 2016-01-26 NOTE — Procedures (Signed)
Interventional Radiology Procedure Note  Procedure:  IVC venogram and filter removal   Complications: None  Estimated Blood Loss: 10 mL  IVC venogram shows normally patent IVC filter; no thrombus.  IVC filter removed without difficulty.  Post venogram shows no thrombus or IVC injury.  Jodi MarbleGlenn T. Fredia SorrowYamagata, M.D Pager:  [phone]

## 2016-01-31 ENCOUNTER — Encounter: Payer: Self-pay | Admitting: Neurology

## 2016-01-31 ENCOUNTER — Telehealth: Payer: Self-pay

## 2016-01-31 NOTE — Telephone Encounter (Signed)
Rn call patient about 2 forms he turn in from [employer] and attending form from [employer]. PT stated the form has to be turn in before 02/22/2016. Rn stated the forms require a full body assessment stating can he bending, stand, lift etc. Rn stated it requires a face to face. Rn requested that patient call both companies to ask for a extension. Rn stated the original forms were dated till 02/22/2016. Pt will have the [employer] and [employer] call [employer] about extending till 12/72017. PT verbalized understanding.

## 2016-02-06 ENCOUNTER — Encounter: Payer: Self-pay | Admitting: Internal Medicine

## 2016-02-06 NOTE — Telephone Encounter (Signed)
Rn call patient about r/s appt for sooner appt. Rn stated per Dr.Sethi he can be in a work in slot. Pt needs to be evaluated for work or more time off. Pt change to Feb 20, 2016 at 0130pm.

## 2016-02-07 NOTE — Telephone Encounter (Signed)
Ok. Thanks!

## 2016-02-20 ENCOUNTER — Encounter: Payer: Self-pay | Admitting: Neurology

## 2016-02-20 ENCOUNTER — Ambulatory Visit (INDEPENDENT_AMBULATORY_CARE_PROVIDER_SITE_OTHER): Payer: BLUE CROSS/BLUE SHIELD | Admitting: Neurology

## 2016-02-20 VITALS — BP 150/65 | HR 110 | Wt 240.0 lb

## 2016-02-20 DIAGNOSIS — G8194 Hemiplegia, unspecified affecting left nondominant side: Secondary | ICD-10-CM | POA: Diagnosis not present

## 2016-02-20 NOTE — Progress Notes (Signed)
[facility] [address] [place]. [place] [id number] [phone]       OFFICE FOLLOW-UP NOTE  Brad Mcgee Date of Birth:  07/13/1985 Medical Record Number:  [id number]   HPI: Office visit 11/13/15 ; Brad Mcgee is a [age] year Caucasian male seen today for first office follow-up visit following intracerebral hemorrhage in May 2017. He is accompanied today by his father and mother.Brad Mcgee is a 30 y.o. male with a history of hypertension who [facility] with [facility]. He was apparently normal at 9:30 pm 08/15/2015, then developed witnessed onset left sided weakness at that time. He was taken [facility] where he was found to have a basal ganglia ICH. Following the initial CT scan, he had subsequent worsening with depressed mental status. Due to this, he required intubation. He is a history of long standing hypertension. Intracerebral Hemorrhage (ICH) ScoreTotal: 1. Patient was not administered IV t-PA secondary to ICH. [facility] and admitted to the neuro ICU for further evaluation and treatment.he was intubated and sedated pt had repeat Ct head which showed mildly increased size of hmg, and increased mass effect. Pt and was subsequently intubated with difficulty weaning, s/p trach. Developed LLE peroneal DVT s/p IVC filter placement given DVT risk in ICH patient. Transferred to CIR for ongoing PT, OT and ST. patient has finished his stay in rehabilitation and is currently home for last 7 weeks. He is not able to walk with a cane with mild left-sided weakness and drags his leg. His getting home physical and occupational therapy however this was recently placed on hold as his blood pressure was quite elevated. He has seen his primary care physician and blood pressure medications have been increased and it is doing much better and today his blood pressure is 134/82 in office. The patient's parents also concern about cognitive impairment. The fetus  short-term memory is poor and he often forgets himself and exhibits poor judgment. Patient can walk short distances by himself but slowly and does use cane for outdoor dose. He is currently living with his mother and grandmother. Patient has a physical job lifting boxes at the [employer]  and is clearly he presently disabled to go back to his job.   Update 02/20/2016 : He returns for follow-up after last visit 3 months ago. He is doing better now. She has improved her stamina and his left-sided strength is improved. He is not having significant cognitive issues any longer. Patient in fact wants return to work without restrictions. He had disability paperwork which I filled out in his presence. Patient is scheduled to undergo sleep study on 03/01/16 and wants to return to work after that He states his been compliant with taking his medications and following up with primary care physicians. His blood pressure is well controlled though it is slightly elevated in office today at 150/61. ROS:   14 system review of systems is positive for  apnea, leg dragging and all other systems negative  PMH:  Past Medical History:  Diagnosis Date  . CVA (cerebral vascular accident) (HCC)   . DVT (deep venous thrombosis) (HCC)   . Fracture of left ankle    treated with cast  . Hypertension     Social History:  Social History   Social History  . Marital status: Single    Spouse name: N/A  . Number of children: N/A  . Years of education: N/A   Occupational History  .  Not on file.   Social History Main Topics  . Smoking status: Never Smoker  . Smokeless tobacco: Never Used  . Alcohol use No  . Drug use: No  . Sexual activity: No   Other Topics Concern  . Not on file   Social History Narrative  . No narrative on file    Medications:   Current Outpatient Prescriptions on File Prior to Visit  Medication Sig Dispense Refill  . amLODipine (NORVASC) 5 MG tablet Take 1 tablet by mouth 2 (two) times  daily.    Marland Kitchen. aspirin 81 MG tablet Take 81 mg by mouth daily.    . hydrochlorothiazide (HYDRODIURIL) 12.5 MG tablet Take 1 tablet by mouth daily.    Marland Kitchen. lisinopril (PRINIVIL,ZESTRIL) 20 MG tablet Take 1 tablet (20 mg total) by mouth 2 (two) times daily. 60 tablet 5  . Multiple Vitamin (MULTIVITAMIN WITH MINERALS) TABS tablet Take 1 tablet by mouth daily. 100 tablet 0   No current facility-administered medications on file prior to visit.     Allergies:   Allergies  Allergen Reactions  . Bee Venom Anaphylaxis  . Coconut Oil Anaphylaxis  . Ancef [Cefazolin] Rash    Physical Exam General: Obese young African-American male, seated, in no evident distress Head: head normocephalic and atraumatic.  Neck: supple with no carotid or supraclavicular bruits Cardiovascular: regular rate and rhythm, no murmurs Musculoskeletal: no deformity Skin:  no rash/petichiae Vascular:  Normal pulses all extremities Vitals:   02/20/16 1338  BP: (!) 150/65  Pulse: (!) 110   Neurologic Exam Mental Status: Awake and fully alert. Oriented to place and time. Recent and remote memory intact. Attention span, concentration and fund of knowledge appropriate. Mood and affect appropriate.  Cranial Nerves: Fundoscopic exam not done. Pupils equal, briskly reactive to light. Extraocular movements full without nystagmus. Visual fields full to confrontation. Hearing intact. Facial sensation intact. Mild left lower facial weakness., palate moves normally and symmetrically.  Motor: Normal bulk and tone. Normal strength in all tested extremity muscles On the right side. Mild left hemiplegia without any left upper and lower extremity drift. Weakness of left grip and intrinsic hand muscles. Orbits right over left upper extremity. Mild weakness of left hip results and ankle dorsiflexors.. Sensory.: intact to touch ,pinprick .position and vibratory sensation.  Coordination: Rapid alternating movements normal in all extremities.  Finger-to-nose and heel-to-shin performed accurately bilaterally. Gait and Station: Arises from chair without difficulty. Stance is normal. Gait demonstrates slight dragging of the left leg   Reflexes: 2+ and asymmetric brisker on the left. Toes downgoing.   NIHSS  1 Modified Rankin  2   ASSESSMENT: [age] year Male with right basal ganglia hematoma likely secondary to malignant hypertension. He has residual mild left hemiplegia but is now improved.     PLAN: I had a long d/w patient and his wifeabout his recent hemorrhagic stroke, risk for recurrent stroke/TIAs, personally independently reviewed imaging studies and stroke evaluation results and answered questions.Continue aspirin 81 mg daily  for secondary stroke prevention and maintain strict control of hypertension with blood pressure goal below 130/90, diabetes with hemoglobin A1c goal below 6.5% and lipids with LDL cholesterol goal below 70 mg/dL. I also advised the patient to eat a healthy diet with plenty of whole grains, cereals, fruits and vegetables, exercise regularly and maintain ideal body weight .patient had disability paperwork filled out. He may return to work without restrictions from 03/05/16. Followup in the future with my nurse practitioner in 6 months or call  earlier if necessary Greater than 50% of time during this 25 minute visit was spent on counseling,explanation of diagnosis, planning of further management, discussion with patient and family and coordination of care Antony Contras, MD  [facility] [address] [place], [place] [address]  Phone [phone] Fax [phone] Note: This document was prepared with digital dictation and possible smart phrase technology. Any transcriptional errors that result from this process are unintentional

## 2016-02-21 NOTE — Telephone Encounter (Signed)
Form fax to [employer] and [employer] for patients disability. Rn also fax office notes to [employer] and [employer]. Both faxes were done twice and confirmed. Pt paid for forms in 10/2015 of 50.00 dollars already for the year.

## 2016-02-22 ENCOUNTER — Ambulatory Visit: Payer: 59 | Admitting: Neurology

## 2016-02-27 ENCOUNTER — Telehealth: Payer: Self-pay | Admitting: Internal Medicine

## 2016-02-27 DIAGNOSIS — G4719 Other hypersomnia: Secondary | ICD-10-CM

## 2016-02-27 DIAGNOSIS — G4733 Obstructive sleep apnea (adult) (pediatric): Secondary | ICD-10-CM

## 2016-02-27 NOTE — Telephone Encounter (Signed)
Per office protocol in lab study as been canceled and HST has been ordered. Nothing further needed.

## 2016-02-27 NOTE — Telephone Encounter (Signed)
Called and spoke with Lillia AbedLindsay at [facility], who stated that pt was scheduled for an in lab CPAP Titration Study instead of a Split Night. Pt had a HST in August 2017. Since [employer] Denied CPAP Titration Study - an Auto CPAP needs to be order through a DME instead of a HST.  Please advise.  This is a correction to the original phone note that was taken earlier.  Please cancel the HST order and place an order for an Auto CPAP. Roderic Palauhonda J Cobb   Per Dr. Nicholos Johnsamachandran please place order for an Auto CPAP Range 5-20 cm per office protocol and cancel HST order. Rhonda J Cobb

## 2016-02-27 NOTE — Telephone Encounter (Signed)
Brad Mcgee with [facility] called and stated that pt's insurance [employer] denied in lab study. Pt will need HST.   If agreeable, please cancel the in lab study order and place an order for HST. Pt is already aware that in lab study has been cancelled due to insurance reasons. Rhonda J Cobb

## 2016-02-28 NOTE — Telephone Encounter (Signed)
Order placed. Nothing further needed. 

## 2016-03-05 ENCOUNTER — Encounter: Payer: Self-pay | Admitting: Neurology

## 2016-03-06 ENCOUNTER — Telehealth: Payer: Self-pay

## 2016-03-06 NOTE — Telephone Encounter (Signed)
Rn receive incoming call from patient about [employer] not receiving the attending physician form. Rn stated the form was fax on 02/21/2016 to [phone].The fax number was listed on the form. It was also confirmed  Pt stated that they gave him another fax number of [phone] to fax forms to. Rn stated that fax number was not on any of his forms. Rn stated another fax will be sent the number [employer] gave him to [phone].

## 2016-03-08 ENCOUNTER — Encounter: Payer: Self-pay | Admitting: Physical Medicine & Rehabilitation

## 2016-03-08 NOTE — Telephone Encounter (Signed)
Spoke to patient over the phone and patient moved appt to 2:40 on 03/28/2016

## 2016-03-28 ENCOUNTER — Encounter: Payer: Self-pay | Admitting: Physical Medicine & Rehabilitation

## 2016-03-28 ENCOUNTER — Encounter: Payer: 59 | Attending: Physical Medicine & Rehabilitation | Admitting: Physical Medicine & Rehabilitation

## 2016-03-28 VITALS — BP 119/85 | HR 87 | Resp 14

## 2016-03-28 DIAGNOSIS — Z86718 Personal history of other venous thrombosis and embolism: Secondary | ICD-10-CM | POA: Insufficient documentation

## 2016-03-28 DIAGNOSIS — N179 Acute kidney failure, unspecified: Secondary | ICD-10-CM | POA: Diagnosis not present

## 2016-03-28 DIAGNOSIS — I10 Essential (primary) hypertension: Secondary | ICD-10-CM | POA: Insufficient documentation

## 2016-03-28 DIAGNOSIS — D696 Thrombocytopenia, unspecified: Secondary | ICD-10-CM | POA: Insufficient documentation

## 2016-03-28 DIAGNOSIS — G4733 Obstructive sleep apnea (adult) (pediatric): Secondary | ICD-10-CM | POA: Insufficient documentation

## 2016-03-28 DIAGNOSIS — I610 Nontraumatic intracerebral hemorrhage in hemisphere, subcortical: Secondary | ICD-10-CM | POA: Diagnosis not present

## 2016-03-28 DIAGNOSIS — Z9989 Dependence on other enabling machines and devices: Secondary | ICD-10-CM

## 2016-03-28 DIAGNOSIS — Z5189 Encounter for other specified aftercare: Secondary | ICD-10-CM | POA: Diagnosis not present

## 2016-03-28 DIAGNOSIS — I629 Nontraumatic intracranial hemorrhage, unspecified: Secondary | ICD-10-CM | POA: Insufficient documentation

## 2016-03-28 NOTE — Progress Notes (Signed)
Subjective:    Patient ID: Brad Mcgee, male    DOB: 08/28/1985, 31 y.o.   MRN: [id number]  HPI  31 y.o. male with history of hypertension presents for follow up for basal ganglia hemorrhage.   Last clinic visit 01/03/16.  Since last visit he denies falls. He started work and driving again without issues.  He completed outpt therapies.  He goes to the gym now.  His IVC filter has been removed.  His BP has improved.  He has been losing weight.  He had a sleep study and is scheduled to have an in home study later this month.  Overall, pt states he is doing well.    Pain Inventory Average Pain 3 Pain Right Now 0 My pain is no pain  In the last 24 hours, has pain interfered with the following? General activity 0 Relation with others 0 Enjoyment of life 0 What TIME of day is your pain at its worst? no pain Sleep (in general) Fair  Pain is worse with: no pain Pain improves with: no pain Relief from Meds: no pain  Mobility ability to climb steps?  yes do you drive?  no  Function Do you have any goals in this area?  yes  Neuro/Psych No problems in this area  Prior Studies hospital f/u  Physicians involved in your care Any changes since last visit?  no   Family History  Problem Relation Age of Onset  . Hypertension Mother   . Breast cancer Maternal Grandmother   . Hypertension Maternal Grandmother   . Prostate cancer Maternal Grandfather   . Hypertension Maternal Grandfather   . Hypertension Paternal Grandfather   . Diabetes Paternal Grandfather   . Multiple sclerosis Paternal Grandmother    Social History   Social History  . Marital status: Single    Spouse name: N/A  . Number of children: N/A  . Years of education: N/A   Social History Main Topics  . Smoking status: Never Smoker  . Smokeless tobacco: Never Used  . Alcohol use No  . Drug use: No  . Sexual activity: No   Other Topics Concern  . None   Social History Narrative  . None   Past  Surgical History:  Procedure Laterality Date  . greenfield filter    . IR GENERIC HISTORICAL  01/26/2016   IR IVC FILTER RETRIEVAL / S&I Lenise Arena GUID/MOD SED 01/26/2016 Irish Lack, MD [facility]   Past Medical History:  Diagnosis Date  . CVA (cerebral vascular accident) (HCC)   . DVT (deep venous thrombosis) (HCC)   . Fracture of left ankle    treated with cast  . Hypertension    BP 119/85   Pulse 87   Resp 14   SpO2 98%   Opioid Risk Score:   Fall Risk Score:  `1  Depression screen PHQ 2/9  Depression screen [facility] 2/9 01/03/2016 11/09/2015 10/05/2015  Decreased Interest 0 0 0  Down, Depressed, Hopeless 0 0 0  PHQ - 2 Score 0 0 0  Altered sleeping - - 0  Tired, decreased energy - - 0  Change in appetite - - 0  Feeling bad or failure about yourself  - - 0  Trouble concentrating - - 0  Moving slowly or fidgety/restless - - 0  Suicidal thoughts - - 0  PHQ-9 Score - - 0   Review of Systems  Constitutional: Negative.   HENT: Negative.   Eyes: Negative.   Respiratory: Negative.  Cardiovascular: Negative.   Gastrointestinal: Negative.   Genitourinary: Negative.   Musculoskeletal: Positive for arthralgias (Occasional) and myalgias (Occasional).  Hematological: Negative.   Psychiatric/Behavioral: Negative.   All other systems reviewed and are negative.     Objective:   Physical Exam Constitutional: He appears well-developed and well-nourished. No distress.  HENT:  Normocephalic and atraumatic.   Eyes: EOMI. No discharge. Cardiovascular: RRR. No JVD.     Respiratory: Effort normal. Clear.     GI: Soft. Bowel sounds normal.  Musculoskeletal: He exhibits no edema or tenderness.  Gait: WNL  Neurological: He is alert and oriented.   Motor: RUE/RLE: 5/5 proximal to distal LUE: 4+-5/5 shoulder abduction, 4+-5/5 elbow flexion/extension, 4+-5/5 finger grip (improving) LLE: 5/5 proximal to distal. No increased tone noted.   Speech continues to improve Skin: Skin is  warm and dry. He is not diaphoretic.  Psychiatric: His mood and behavior appear normal.     Assessment & Plan:  31 y.o. male with history of hypertension presents for follow up right basal ganglia hemorrhage.  1.  Right basal ganglia hemorrhage  Cont follow up Neurology  Completed gym  Doing well at work and driving  2.  Left peroneal DVT  Resolved on most recent doppler U/S  IVC filter removed  3. HTN  Cont meds, under good control at present  Follow up with PCP  4. OSA  Complete OSA study, pt to have home study later this month  5. Obesity  Cont to lose weight  Pt doing well

## 2016-03-28 NOTE — Progress Notes (Deleted)
   Subjective:    Patient ID: Brad Mcgee, male    DOB: 05/19/1985, 31 y.o.   MRN: [id number]  HPI   Pain Inventory Average Pain 3 Pain Right Now 0 My pain is n/a  In the last 24 hours, has pain interfered with the following? General activity 0 Relation with others 0 Enjoyment of life 0 What TIME of day is your pain at its worst? evening Sleep (in general) Good  Pain is worse with: walking Pain improves with: n/a Relief from Meds: n/a  Mobility walk without assistance how many minutes can you walk? 30 ability to climb steps?  yes do you drive?  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  no  Physicians involved in your care Any changes since last visit?  no   Family History  Problem Relation Age of Onset  . Hypertension Mother   . Breast cancer Maternal Grandmother   . Hypertension Maternal Grandmother   . Prostate cancer Maternal Grandfather   . Hypertension Maternal Grandfather   . Hypertension Paternal Grandfather   . Diabetes Paternal Grandfather   . Multiple sclerosis Paternal Grandmother    Social History   Social History  . Marital status: Single    Spouse name: N/A  . Number of children: N/A  . Years of education: N/A   Social History Main Topics  . Smoking status: Never Smoker  . Smokeless tobacco: Never Used  . Alcohol use No  . Drug use: No  . Sexual activity: No   Other Topics Concern  . None   Social History Narrative  . None   Past Surgical History:  Procedure Laterality Date  . greenfield filter    . IR GENERIC HISTORICAL  01/26/2016   IR IVC FILTER RETRIEVAL / S&I Lenise Arena/IMG GUID/MOD SED 01/26/2016 Irish LackGlenn Yamagata, MD [facility]   Past Medical History:  Diagnosis Date  . CVA (cerebral vascular accident) (HCC)   . DVT (deep venous thrombosis) (HCC)   . Fracture of left ankle    treated with cast  .  Hypertension    BP 119/85   Pulse 87   Resp 14   SpO2 98%   Opioid Risk Score:   Fall Risk Score:  `1  Depression screen PHQ 2/9  [facility] 2/[facility] 01/03/2016 11/09/2015 10/05/2015  Decreased Interest 0 0 0  Down, Depressed, Hopeless 0 0 0  PHQ - 2 Score 0 0 0  Altered sleeping - - 0  Tired, decreased energy - - 0  Change in appetite - - 0  Feeling bad or failure about yourself  - - 0  Trouble concentrating - - 0  Moving slowly or fidgety/restless - - 0  Suicidal thoughts - - 0  PHQ-9 Score - - 0    Review of Systems  Constitutional: Negative.   HENT: Negative.   Eyes: Negative.   Respiratory: Negative.   Cardiovascular: Negative.   Gastrointestinal: Negative.   Endocrine: Negative.   Genitourinary: Negative.   Musculoskeletal: Negative.   Skin: Negative.   Allergic/Immunologic: Negative.   Neurological: Negative.   Hematological: Negative.   Psychiatric/Behavioral: Negative.   All other systems reviewed and are negative.      Objective:   Physical Exam        Assessment & Plan:

## 2016-04-04 ENCOUNTER — Ambulatory Visit: Payer: 59 | Admitting: Physical Medicine & Rehabilitation

## 2016-04-12 ENCOUNTER — Encounter: Payer: Self-pay | Admitting: Internal Medicine

## 2016-04-12 MED ORDER — LOSARTAN POTASSIUM 25 MG PO TABS: 25.0000 mg | ORAL_TABLET | Freq: Every day | ORAL | 2 refills | Status: DC

## 2016-04-19 DIAGNOSIS — Z0271 Encounter for disability determination: Secondary | ICD-10-CM

## 2016-04-22 ENCOUNTER — Telehealth: Payer: Self-pay | Admitting: Internal Medicine

## 2016-04-22 NOTE — Telephone Encounter (Signed)
Just FYI-pt had an [facility] for CP[facility] and did not show for this appointment. Rhonda J Cobb

## 2016-05-29 ENCOUNTER — Encounter: Payer: Self-pay | Admitting: Internal Medicine

## 2016-05-29 MED ORDER — LOSARTAN POTASSIUM 25 MG PO TABS: 25.0000 mg | ORAL_TABLET | Freq: Every day | ORAL | 2 refills | Status: DC

## 2016-06-12 ENCOUNTER — Encounter: Payer: Self-pay | Admitting: Internal Medicine

## 2016-06-12 MED ORDER — LOSARTAN POTASSIUM 25 MG PO TABS: 25.0000 mg | ORAL_TABLET | Freq: Every day | ORAL | 0 refills | Status: DC

## 2016-06-13 MED ORDER — LOSARTAN POTASSIUM 25 MG PO TABS: 25.0000 mg | ORAL_TABLET | Freq: Every day | ORAL | 0 refills | Status: DC

## 2016-07-02 ENCOUNTER — Other Ambulatory Visit: Payer: Self-pay | Admitting: Family Medicine

## 2016-08-15 ENCOUNTER — Encounter: Payer: Self-pay | Admitting: Internal Medicine

## 2016-08-15 MED ORDER — LOSARTAN POTASSIUM 25 MG PO TABS: 25.0000 mg | ORAL_TABLET | Freq: Every day | ORAL | 0 refills | Status: DC

## 2016-08-19 MED ORDER — LOSARTAN POTASSIUM 25 MG PO TABS: 25.0000 mg | ORAL_TABLET | Freq: Every day | ORAL | 0 refills | Status: DC

## 2016-08-20 ENCOUNTER — Ambulatory Visit (INDEPENDENT_AMBULATORY_CARE_PROVIDER_SITE_OTHER): Payer: 59 | Admitting: Nurse Practitioner

## 2016-08-20 ENCOUNTER — Encounter: Payer: Self-pay | Admitting: Nurse Practitioner

## 2016-08-20 VITALS — BP 147/97 | HR 79 | Ht 67.0 in | Wt 272.6 lb

## 2016-08-20 DIAGNOSIS — I610 Nontraumatic intracerebral hemorrhage in hemisphere, subcortical: Secondary | ICD-10-CM

## 2016-08-20 DIAGNOSIS — I10 Essential (primary) hypertension: Secondary | ICD-10-CM

## 2016-08-20 NOTE — Patient Instructions (Addendum)
Continue aspirin 81 mg daily  for secondary stroke prevention  Maintain strict control of hypertension with blood pressure goal below 130/90,  lipids with LDL cholesterol goal below 70 mg/dL I also advised the patient to eat a healthy diet with plenty of whole grains, cereals, fruits and vegetables,  exercise regularly  Neurologically stable will discharge Stroke Prevention Some medical conditions and behaviors are associated with an increased chance of having a stroke. You may prevent a stroke by making healthy choices and managing medical conditions. How can I reduce my risk of having a stroke?  Stay physically active. Get at least 30 minutes of activity on most or all days.  Do not smoke. It may also be helpful to avoid exposure to secondhand smoke.  Limit alcohol use. Moderate alcohol use is considered to be: ? No more than 2 drinks per day for men. ? No more than 1 drink per day for nonpregnant women.  Eat healthy foods. This involves: ? Eating 5 or more servings of fruits and vegetables a day. ? Making dietary changes that address high blood pressure (hypertension), high cholesterol, diabetes, or obesity.  Manage your cholesterol levels. ? Making food choices that are high in fiber and low in saturated fat, trans fat, and cholesterol may control cholesterol levels. ? Take any prescribed medicines to control cholesterol as directed by your health care provider.  Manage your diabetes. ? Controlling your carbohydrate and sugar intake is recommended to manage diabetes. ? Take any prescribed medicines to control diabetes as directed by your health care provider.  Control your hypertension. ? Making food choices that are low in salt (sodium), saturated fat, trans fat, and cholesterol is recommended to manage hypertension. ? Ask your health care provider if you need treatment to lower your blood pressure. Take any prescribed medicines to control hypertension as directed by your health  care provider. ? If you are 63-68 years of age, have your blood pressure checked every 3-5 years. If you are 44 years of age or older, have your blood pressure checked every year.  Maintain a healthy weight. ? Reducing calorie intake and making food choices that are low in sodium, saturated fat, trans fat, and cholesterol are recommended to manage weight.  Stop drug abuse.  Avoid taking birth control pills. ? Talk to your health care provider about the risks of taking birth control pills if you are over 71 years old, smoke, get migraines, or have ever had a blood clot.  Get evaluated for sleep disorders (sleep apnea). ? Talk to your health care provider about getting a sleep evaluation if you snore a lot or have excessive sleepiness.  Take medicines only as directed by your health care provider. ? For some people, aspirin or blood thinners (anticoagulants) are helpful in reducing the risk of forming abnormal blood clots that can lead to stroke. If you have the irregular heart rhythm of atrial fibrillation, you should be on a blood thinner unless there is a good reason you cannot take them. ? Understand all your medicine instructions.  Make sure that other conditions (such as anemia or atherosclerosis) are addressed. Get help right away if:  You have sudden weakness or numbness of the face, arm, or leg, especially on one side of the body.  Your face or eyelid droops to one side.  You have sudden confusion.  You have trouble speaking (aphasia) or understanding.  You have sudden trouble seeing in one or both eyes.  You have sudden trouble walking.  You have dizziness.  You have a loss of balance or coordination.  You have a sudden, severe headache with no known cause.  You have new chest pain or an irregular heartbeat. Any of these symptoms may represent a serious problem that is an emergency. Do not wait to see if the symptoms will go away. Get medical help at once. Call your  local emergency services (911 in [place] Do not drive yourself to the hospital. This information is not intended to replace advice given to you by your health care provider. Make sure you discuss any questions you have with your health care provider. Document Released: 04/11/2004 Document Revised: 08/10/2015 Document Reviewed: 09/04/2012 Elsevier Interactive Patient Education  2017 [employer]

## 2016-08-20 NOTE — Progress Notes (Signed)
[facility]  PATIENT: Brad Mcgee DOB: 07-11-1985   REASON FOR VISIT: Follow-up for history of intracerebral hemorrhage May 2017 HISTORY FROM: Patient    HISTORY OF PRESENT ILLNESS:Office visit 11/13/15 ; Brad Mcgee is a 3 year Caucasian male seen today for first office follow-up visit following intracerebral hemorrhage in May 2017. He is accompanied today by his father and mother.Brad Mcgee is a 31 y.o. male with a history of hypertension who presents with ICH to [facility]. He was apparently normal at 9:30 pm 08/15/2015, then developed witnessed onset left sided weakness at that time. He was taken [facility] where he was found to have a basal ganglia ICH. Following the initial CT scan, he had subsequent worsening with depressed mental status. Due to this, he required intubation. He is a history of long standing hypertension. Intracerebral Hemorrhage (ICH) ScoreTotal: 1. Patient was not administered IV t-PA secondary to ICH. He was transferred to [facility] and admitted to the neuro ICU for further evaluation and treatment.he was intubated and sedated pt had repeat Ct head which showed mildly increased size of hmg, and increased mass effect. Pt and was subsequently intubated with difficulty weaning, s/p trach. Developed LLE peroneal DVT s/p IVC filter placement given DVT risk in ICH patient. Transferred to CIR for ongoing PT, OT and ST. patient has finished his stay in rehabilitation and is currently home for last 7 weeks. He is not able to walk with a cane with mild left-sided weakness and drags his leg. His getting home physical and occupational therapy however this was recently placed on hold as his blood pressure was quite elevated. He has seen his primary care physician and blood pressure medications have been increased and it is doing much better and today his blood pressure is 134/82 in office. The patient's parents also concern about cognitive impairment. The   short-term memory is poor and he often forgets himself and exhibits poor judgment. Patient can walk short distances by himself but slowly and does use cane for outdoor dose. He is currently living with his mother and grandmother. Patient has a physical job lifting boxes at the [employer]  and is clearly he presently disabled to go back to his job.  Update 02/20/2016 : He returns for follow-up after last visit 3 months ago. He is doing better now. He has improved her stamina and his left-sided strength is improved. He is not having significant cognitive issues any longer. Patient in fact wants return to work without restrictions. He had disability paperwork which I filled out in his presence. Patient is scheduled to undergo sleep study on 03/01/16 and wants to return to work after that He states his been compliant with taking his medications and following up with primary care physicians. His blood pressure is well controlled though it is slightly elevated in office today at 150/61. UPDATE 06/05/2018CM Brad Mcgee, 31 year old male returns for follow-up with a history of intracerebral hemorrhage 2017. Patient claims he has been compliant with his 3 blood pressure medications. Blood pressure in the office today 147/97. Patient is back to work full-time. He has no weakness. He denies any memory issues. He is getting married this weekend he has no new neurologic complaints he returns for reevaluation.  REVIEW OF SYSTEMS: Full 14 system review of systems performed and notable only for those listed, all others are neg:  Constitutional: neg  Cardiovascular: neg Ear/Nose/Throat: neg  Skin: neg Eyes: neg Respiratory: neg Gastroitestinal: neg  Hematology/Lymphatic: neg  Endocrine: neg Musculoskeletal:neg Allergy/Immunology: neg Neurological: neg Psychiatric: neg Sleep : neg   ALLERGIES: Allergies  Allergen Reactions  . Bee Venom Anaphylaxis  . Coconut Oil Anaphylaxis  . Ancef [Cefazolin] Rash    HOME  MEDICATIONS: Outpatient Medications Prior to Visit  Medication Sig Dispense Refill  . amLODipine (NORVASC) 5 MG tablet Take 1 tablet by mouth 2 (two) times daily.    Marland Kitchen. aspirin 81 MG tablet Take 81 mg by mouth daily.    . hydrochlorothiazide (HYDRODIURIL) 12.5 MG tablet Take 1 tablet by mouth 2 (two) times daily.     Marland Kitchen. losartan (COZAAR) 25 MG tablet Take 1 tablet (25 mg total) by mouth daily. (Patient taking differently: Take 25 mg by mouth 2 (two) times daily. ) 30 tablet 0  . Multiple Vitamin (MULTIVITAMIN WITH MINERALS) TABS tablet Take 1 tablet by mouth daily. 100 tablet 0   No facility-administered medications prior to visit.     PAST MEDICAL HISTORY: Past Medical History:  Diagnosis Date  . CVA (cerebral vascular accident) (HCC)   . DVT (deep venous thrombosis) (HCC)   . Fracture of left ankle    treated with cast  . Hypertension     PAST SURGICAL HISTORY: Past Surgical History:  Procedure Laterality Date  . greenfield filter    . IR GENERIC HISTORICAL  01/26/2016   IR IVC FILTER RETRIEVAL / S&I Lenise Arena/IMG GUID/MOD SED 01/26/2016 Irish LackGlenn Yamagata, MD [facility]-INTERV RAD    FAMILY HISTORY: Family History  Problem Relation Age of Onset  . Hypertension Mother   . Breast cancer Maternal Grandmother   . Hypertension Maternal Grandmother   . Prostate cancer Maternal Grandfather   . Hypertension Maternal Grandfather   . Hypertension Paternal Grandfather   . Diabetes Paternal Grandfather   . Multiple sclerosis Paternal Grandmother     SOCIAL HISTORY: Social History   Social History  . Marital status: Single    Spouse name: N/A  . Number of children: N/A  . Years of education: N/A   Occupational History  . Not on file.   Social History Main Topics  . Smoking status: Never Smoker  . Smokeless tobacco: Never Used  . Alcohol use No  . Drug use: No  . Sexual activity: No   Other Topics Concern  . Not on file   Social History Narrative  . No narrative on file      PHYSICAL EXAM  Vitals:   08/20/16 0920  BP: (!) 147/97  Pulse: 79  Weight: 272 lb 9.6 oz (123.7 kg)  Height: 5\' 7"  (1.702 m)   Body mass index is 42.7 kg/m.  Generalized: Well developed, Obese male in no acute distress  Head: normocephalic and atraumatic,. Oropharynx benign  Neck: Supple, no carotid bruits  Cardiac: Regular rate rhythm, no murmur  Musculoskeletal: No deformity   Neurological examination   Mentation: Alert oriented to time, place, history taking. Attention span and concentration appropriate. Recent and remote memory intact.  Follows all commands speech and language fluent.   Cranial nerve II-XII: Pupils were equal round reactive to light extraocular movements were full, visual field were full on confrontational test. Facial sensation and strength were normal. hearing was intact to finger rubbing bilaterally. Uvula tongue midline. head turning and shoulder shrug were normal and symmetric.Tongue protrusion into cheek strength was normal. Motor: normal bulk and tone, full strength in the BUE, BLE, fine finger movements normal, no pronator drift. No focal weakness Sensory: normal and symmetric to light touch, pinprick, and  Vibration, in the upper and lower extremities Coordination: finger-nose-finger, heel-to-shin bilaterally, no dysmetria Reflexes: Symmetric upper and lower, plantar responses were flexor bilaterally. Gait and Station: Rising up from seated position without assistance, normal stance,  moderate stride, good arm swing, smooth turning, able to perform tiptoe, and heel walking without difficulty. Tandem gait is steady  DIAGNOSTIC DATA (LABS, IMAGING, TESTING) - I reviewed patient records, labs, notes, testing and imaging myself where available.  Lab Results  Component Value Date   WBC 5.4 01/26/2016   HGB 14.4 01/26/2016   HCT 41.3 01/26/2016   MCV 91.8 01/26/2016   PLT 210 01/26/2016      Component Value Date/Time   NA 138 01/26/2016 1225    K 3.6 01/26/2016 1225   CL 102 01/26/2016 1225   CO2 24 01/26/2016 1225   GLUCOSE 90 01/26/2016 1225   BUN 12 01/26/2016 1225   CREATININE 1.12 01/26/2016 1225   CALCIUM 10.0 01/26/2016 1225   PROT 6.6 09/28/2015 1434   ALBUMIN 3.6 09/28/2015 1434   AST 26 09/28/2015 1434   ALT 31 09/28/2015 1434   ALKPHOS 46 09/28/2015 1434   BILITOT 0.7 09/28/2015 1434   GFRNONAA >60 01/26/2016 1225   GFRAA >60 01/26/2016 1225   Lab Results  Component Value Date   TRIG 331 (H) 08/22/2015   Lab Results  Component Value Date   HGBA1C 5.6 09/02/2015    ASSESSMENT AND PLAN 31 year Male with right basal ganglia hematoma likely secondary to malignant hypertension. He has no residual Neurologic deficits deficits.  Continue aspirin 81 mg daily  for secondary stroke prevention  Maintain strict control of hypertension with blood pressure goal below 130/90, today's reading 147 97 continue antihypertensive medications lipids with LDL cholesterol goal below 70 mg/dL  I also advised the patient to eat a healthy diet with plenty of whole grains, cereals, fruits and vegetables,  exercise regularly to maintain a healthy  weight Neurologically stable will discharge I spent  in total face to face time with the patient more than 50% of which was spent counseling and coordination of care, reviewing test results reviewing medications and discussing and reviewing the diagnosis of intracerebral hemorrhage and importance of controlling blood pressure.Also discussed management of stroke risk factors and given written information A portion of the above document was  copied from a  prior to visit note for review purposes only Nilda Riggs, [facility], [facility], APRN  [facility] [address] [place], [place] [address] [phone]

## 2016-08-20 NOTE — Progress Notes (Signed)
I agree with the above plan 

## 2016-08-21 ENCOUNTER — Encounter: Payer: Self-pay | Admitting: Internal Medicine

## 2016-09-04 IMAGING — XA IR IVC FILTER PLMT / S&I /IMG GUID/MOD SED
1 series · 14 of 24 positions shown · IV contrast (IODINE)
Comparison: none

INDICATION: DVT.  Intracranial hemorrhage.

[Series 300: dsa body · 14 of 28 slices shown]
[im 1/28]
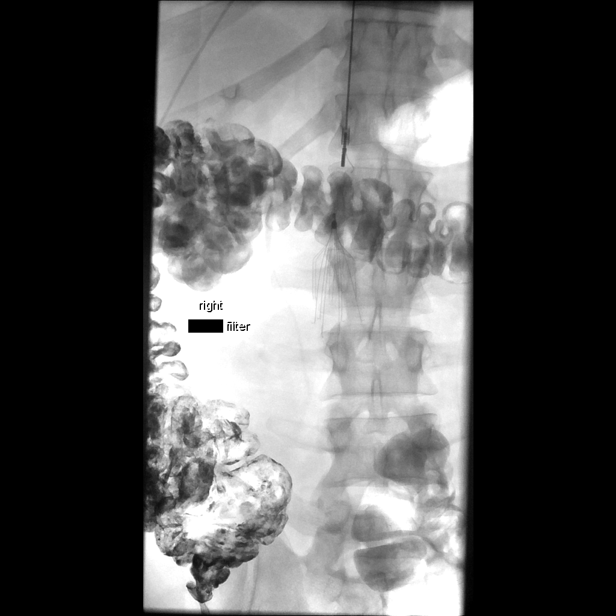
[im 3/28]
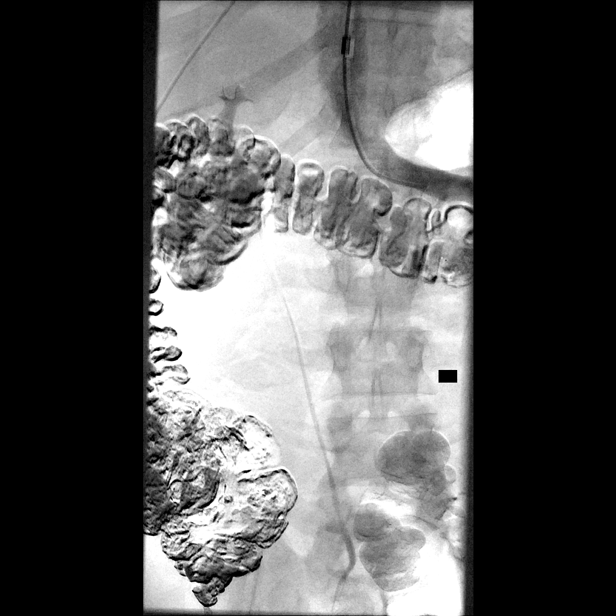
[im 5/28]
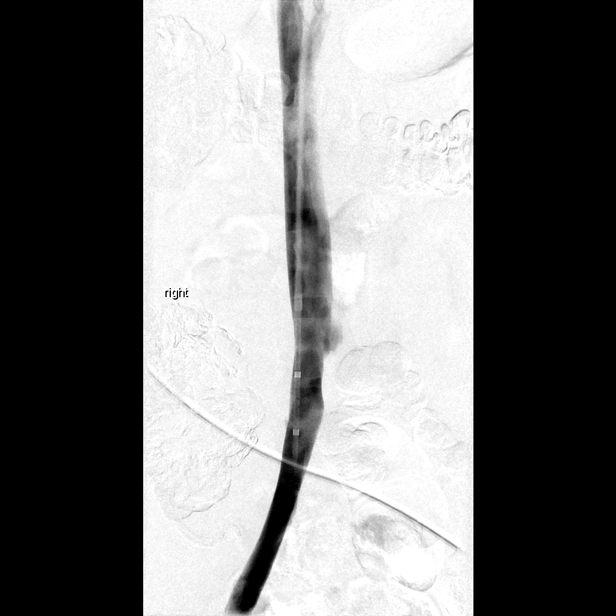
[im 8/28]
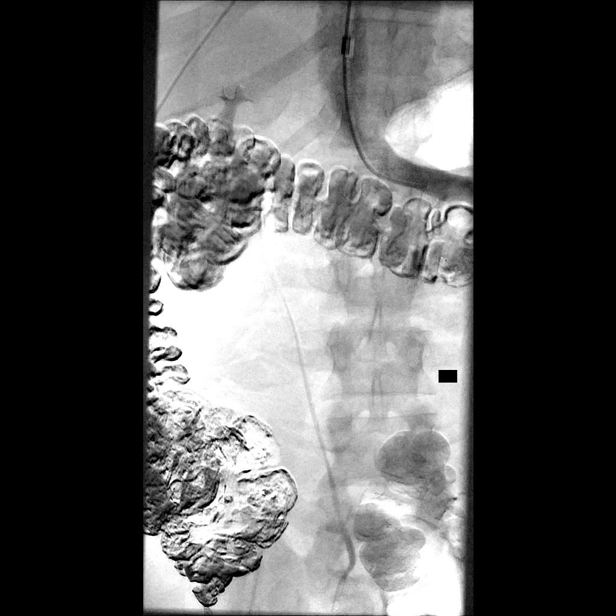
[im 9/28]
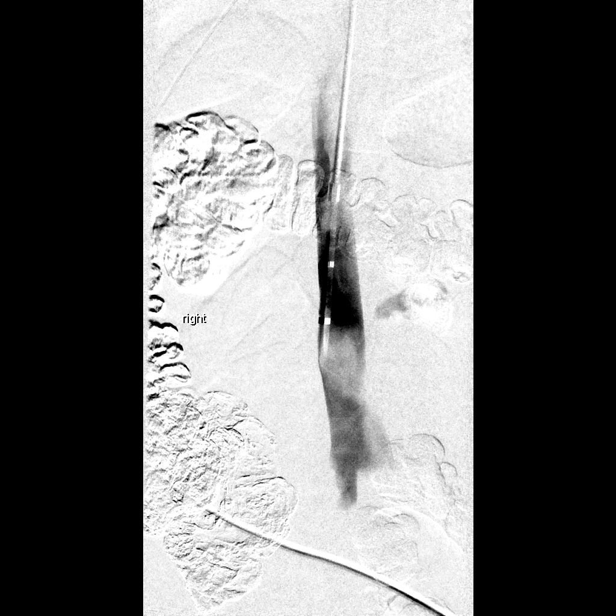
[im 11/28]
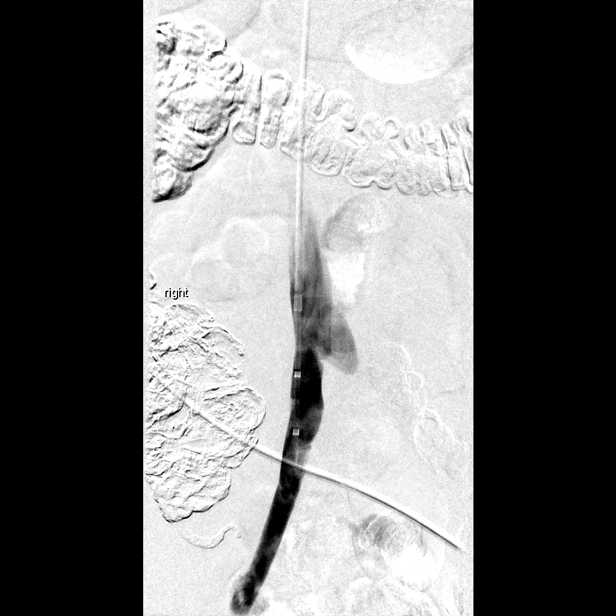
[im 13/28]
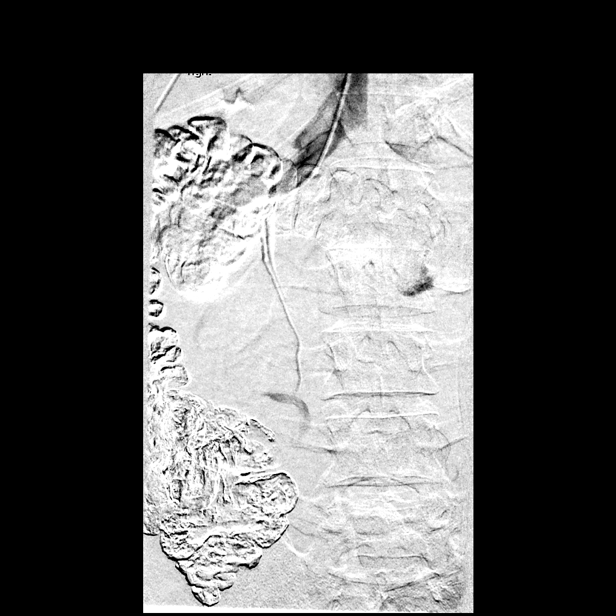
[im 15/28]
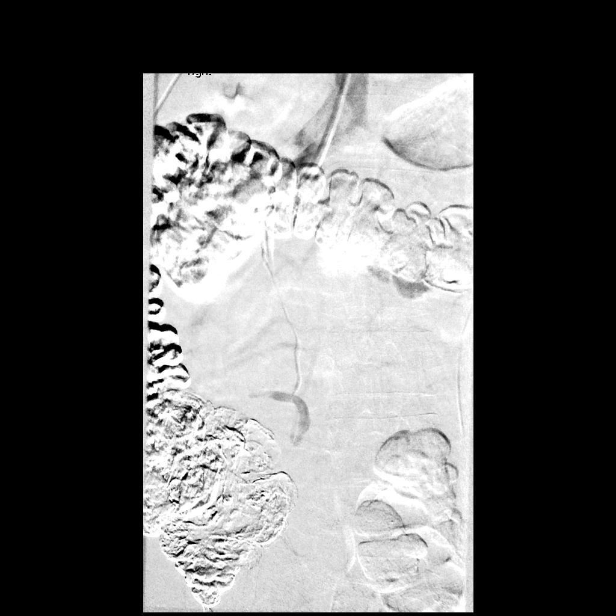
[im 17/28]
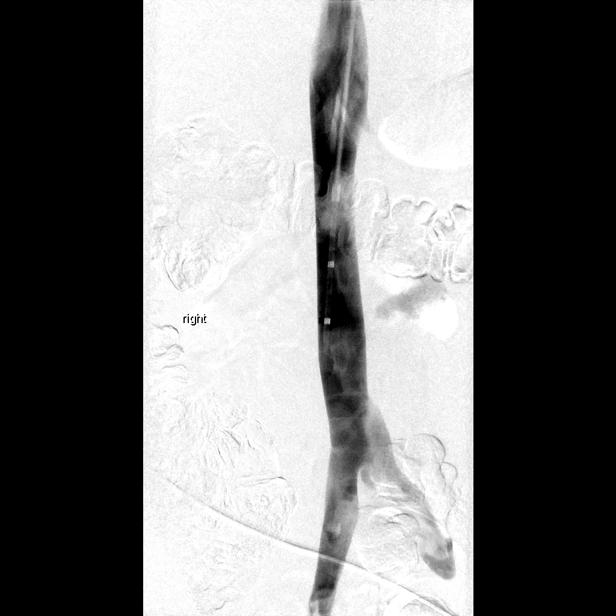
[im 19/28]
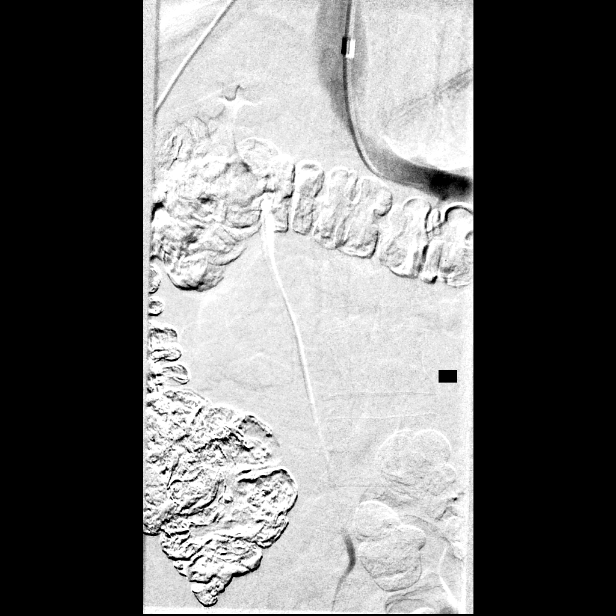
[im 22/28]
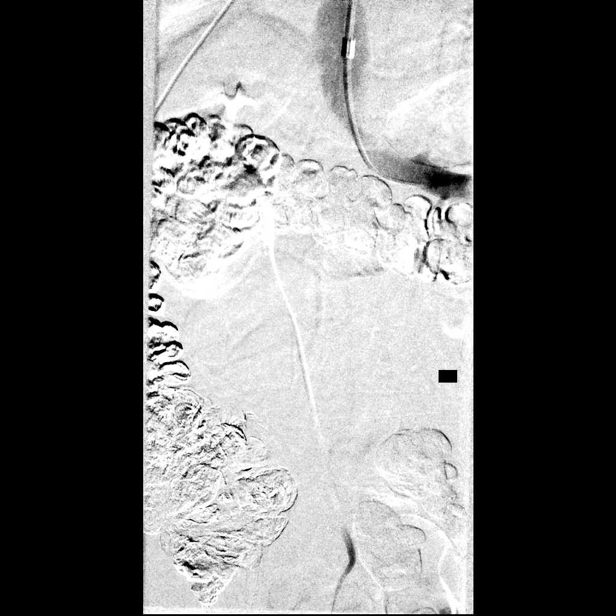
[im 23/28]
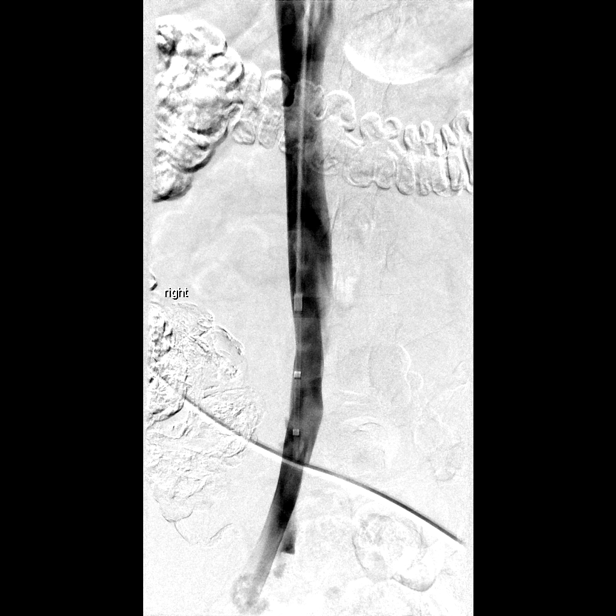
[im 25/28]
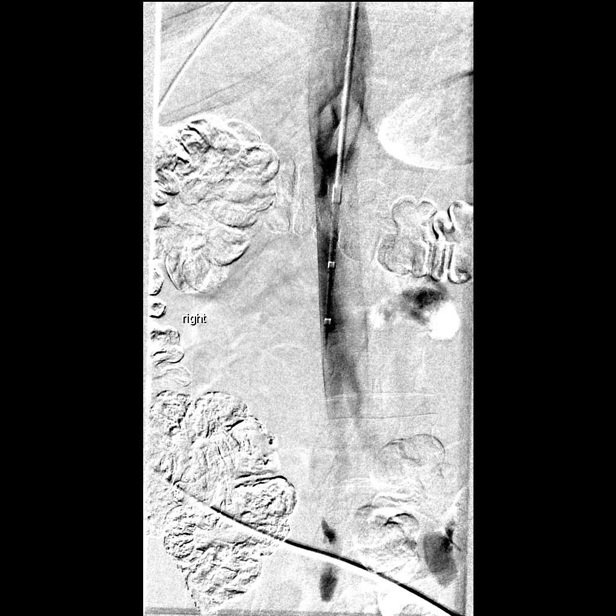
[im 28/28]
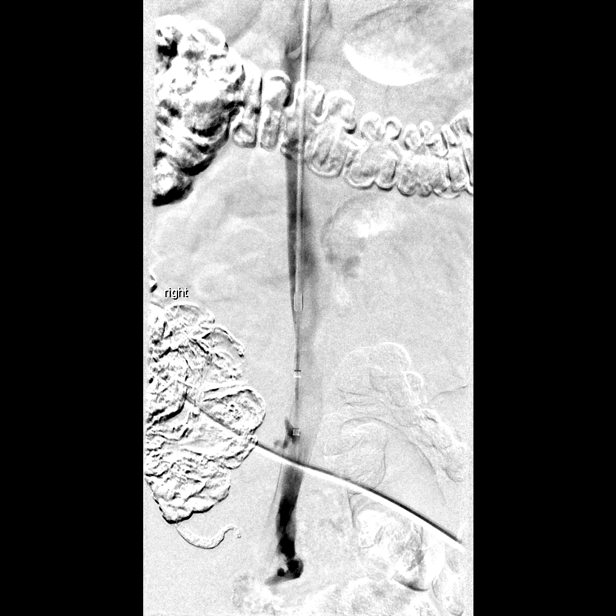

[14 of 24 positions shown; findings below may reference images not displayed]

EXAM:
IVC FILTER,INFERIOR VENA CAVOGRAM

MEDICATIONS:
None.

ANESTHESIA/SEDATION:
Fentanyl 25 mcg IV; Versed 1 mg IV

Moderate Sedation Time:  22 minutes

The patient was continuously monitored during the procedure by the
interventional radiology nurse under my direct supervision.

FLUOROSCOPY TIME:  Fluoroscopy Time: 3 minutes 12 seconds (319 mGy).

COMPLICATIONS:
None immediate.

PROCEDURE:
Informed written consent was obtained from the patient after a
thorough discussion of the procedural risks, benefits and
alternatives. All questions were addressed. Maximal Sterile Barrier
Technique was utilized including caps, mask, sterile gowns, sterile
gloves, sterile drape, hand hygiene and skin antiseptic. A timeout
was performed prior to the initiation of the procedure.

The right neck was prepped with Betadine in a sterile fashion, and a
sterile drape was applied covering the operative field. A sterile
gown and sterile gloves were used for the procedure.

The right jugular vein was noted to be patent initially with
ultrasound. Under sonographic guidance, a micropuncture needle was
inserted into the right jugular vein (Ultrasound image documentation
was performed). It was removed over an 018 wire which was up-sized
to Janki Eli. The sheath was inserted over the wire and into the IVC.
IVC venography was performed.

The temporary filter was then deployed in the infrarenal IVC. The
sheath was removed and hemostasis was achieved with direct pressure.
FINDINGS: IVC and bilateral renal venography demonstrates renal vein inflow at
L1 and no venous anomaly.

Final image demonstrates an IVC filter in place with its tip at the
lower L1 endplate.
IMPRESSION: Successful infrarenal IVC filter placement. This is a temporary
filter. It can be removed or remain in place to become permanent.

## 2016-09-07 ENCOUNTER — Other Ambulatory Visit: Payer: Self-pay | Admitting: Family Medicine

## 2016-09-26 ENCOUNTER — Encounter: Payer: Self-pay | Admitting: Internal Medicine

## 2016-09-26 ENCOUNTER — Encounter: Payer: Self-pay | Admitting: Physical Medicine & Rehabilitation

## 2016-09-26 ENCOUNTER — Encounter: Payer: 59 | Attending: Physical Medicine & Rehabilitation | Admitting: Physical Medicine & Rehabilitation

## 2016-09-26 VITALS — BP 156/97 | HR 88

## 2016-09-26 DIAGNOSIS — E669 Obesity, unspecified: Secondary | ICD-10-CM | POA: Insufficient documentation

## 2016-09-26 DIAGNOSIS — Z8673 Personal history of transient ischemic attack (TIA), and cerebral infarction without residual deficits: Secondary | ICD-10-CM | POA: Diagnosis not present

## 2016-09-26 DIAGNOSIS — I10 Essential (primary) hypertension: Secondary | ICD-10-CM

## 2016-09-26 DIAGNOSIS — I610 Nontraumatic intracerebral hemorrhage in hemisphere, subcortical: Secondary | ICD-10-CM | POA: Insufficient documentation

## 2016-09-26 NOTE — Progress Notes (Signed)
Subjective:    Patient ID: Brad Mcgee, male    DOB: September 24, 1985, 31 y.o.   MRN: [id number]  HPI  31 y.o. male with history of hypertension presents for follow up for basal ganglia hemorrhage.   Last clinic visit 03/28/16.  Since last visit, he has been discharged from Neurology.  He continues to do HEP.  His BP is elevated.  He states he hasn't been to PCP in some time.  He states he didn't have a sleep study because it was not covered by insurance. He states his weight loss is variable.    Pain Inventory Average Pain 3 Pain Right Now 0 My pain is no pain  In the last 24 hours, has pain interfered with the following? General activity 0 Relation with others 0 Enjoyment of life 0 What TIME of day is your pain at its worst? no pain Sleep (in general) Fair  Pain is worse with: no pain Pain improves with: no pain Relief from Meds: no pain  Mobility ability to climb steps?  yes do you drive?  no  Function Do you have any goals in this area?  yes  Neuro/Psych No problems in this area  Prior Studies hospital f/u  Physicians involved in your care Any changes since last visit?  no   Family History  Problem Relation Age of Onset  . Hypertension Mother   . Breast cancer Maternal Grandmother   . Hypertension Maternal Grandmother   . Prostate cancer Maternal Grandfather   . Hypertension Maternal Grandfather   . Hypertension Paternal Grandfather   . Diabetes Paternal Grandfather   . Multiple sclerosis Paternal Grandmother    Social History   Social History  . Marital status: Single    Spouse name: N/A  . Number of children: N/A  . Years of education: N/A   Social History Main Topics  . Smoking status: Never Smoker  . Smokeless tobacco: Never Used  . Alcohol use No  . Drug use: No  . Sexual activity: No   Other Topics Concern  . None   Social History Narrative  . None   Past Surgical History:  Procedure Laterality Date  . greenfield filter    . IR  GENERIC HISTORICAL  01/26/2016   IR IVC FILTER RETRIEVAL / S&I Lenise Arena GUID/MOD SED 01/26/2016 Irish Lack, MD [facility]-INTERV RAD   Past Medical History:  Diagnosis Date  . CVA (cerebral vascular accident) (HCC)   . DVT (deep venous thrombosis) (HCC)   . Fracture of left ankle    treated with cast  . Hypertension    BP (!) 156/97   Pulse 88   SpO2 99%   Opioid Risk Score:   Fall Risk Score:  `1  Depression screen PHQ 2/9  Depression screen [facility] 2/9 01/03/2016 11/09/2015 10/05/2015  Decreased Interest 0 0 0  Down, Depressed, Hopeless 0 0 0  PHQ - 2 Score 0 0 0  Altered sleeping - - 0  Tired, decreased energy - - 0  Change in appetite - - 0  Feeling bad or failure about yourself  - - 0  Trouble concentrating - - 0  Moving slowly or fidgety/restless - - 0  Suicidal thoughts - - 0  PHQ-9 Score - - 0   Review of Systems  Constitutional: Negative.   HENT: Negative.   Eyes: Negative.   Respiratory: Negative.   Cardiovascular: Negative.   Gastrointestinal: Negative.   Endocrine: Negative.   Genitourinary: Negative.   Musculoskeletal: Positive  for arthralgias (Occasional) and myalgias (Occasional).  Skin: Negative.   Allergic/Immunologic: Negative.   Neurological: Negative.   Hematological: Negative.   Psychiatric/Behavioral: Negative.   All other systems reviewed and are negative.     Objective:   Physical Exam Constitutional: He appears well-developed and well-nourished. No distress.  HENT:  Normocephalic and atraumatic.   Eyes: EOMI. No discharge. Cardiovascular: RRR. No JVD.     Respiratory: Effort normal. Clear.     GI: Soft. Bowel sounds normal.  Musculoskeletal: He exhibits no edema or tenderness.  Gait: WNL  Neurological: He is alert and oriented.   Motor: RUE/RLE: 5/5 proximal to distal LUE: 5/5 shoulder abduction, 5/5 elbow flexion/extension, 5/5 finger grip LLE: 5/5 proximal to distal. No increased tone noted.   Speech continues to improve Skin: Skin is  warm and dry. He is not diaphoretic.  Psychiatric: His mood and behavior appear normal.     Assessment & Plan:  31 y.o. male with history of hypertension presents for follow up right basal ganglia hemorrhage.  1.  Right basal ganglia hemorrhage  Discharged by Neurology  Doing well  2. HTN  Elevated  Encouraged follow up with PCP  3. Obesity  Cont weight loss

## 2016-09-27 ENCOUNTER — Encounter: Payer: Self-pay | Admitting: *Deleted

## 2016-10-08 ENCOUNTER — Other Ambulatory Visit: Payer: Self-pay | Admitting: Family Medicine

## 2016-11-07 ENCOUNTER — Other Ambulatory Visit: Payer: Self-pay | Admitting: Internal Medicine

## 2016-11-15 ENCOUNTER — Encounter: Payer: Self-pay | Admitting: Internal Medicine

## 2016-11-26 MED ORDER — LOSARTAN POTASSIUM 25 MG PO TABS: 25.0000 mg | ORAL_TABLET | Freq: Every day | ORAL | 0 refills | Status: DC

## 2016-11-26 MED ORDER — HYDROCHLOROTHIAZIDE 12.5 MG PO TABS: 12.5000 mg | ORAL_TABLET | Freq: Two times a day (BID) | ORAL | 0 refills | Status: DC

## 2016-12-26 ENCOUNTER — Encounter: Payer: Self-pay | Admitting: Physical Medicine & Rehabilitation

## 2016-12-30 ENCOUNTER — Ambulatory Visit (INDEPENDENT_AMBULATORY_CARE_PROVIDER_SITE_OTHER): Payer: 59 | Admitting: Internal Medicine

## 2016-12-30 ENCOUNTER — Encounter: Payer: Self-pay | Admitting: Internal Medicine

## 2016-12-30 VITALS — BP 150/100 | HR 74 | Temp 98.0°F | Ht 67.75 in | Wt 288.0 lb

## 2016-12-30 DIAGNOSIS — Z0000 Encounter for general adult medical examination without abnormal findings: Secondary | ICD-10-CM | POA: Diagnosis not present

## 2016-12-30 DIAGNOSIS — I10 Essential (primary) hypertension: Secondary | ICD-10-CM

## 2016-12-30 DIAGNOSIS — G4733 Obstructive sleep apnea (adult) (pediatric): Secondary | ICD-10-CM | POA: Insufficient documentation

## 2016-12-30 DIAGNOSIS — I610 Nontraumatic intracerebral hemorrhage in hemisphere, subcortical: Secondary | ICD-10-CM

## 2016-12-30 MED ORDER — LOSARTAN POTASSIUM 25 MG PO TABS: 25.0000 mg | ORAL_TABLET | Freq: Two times a day (BID) | ORAL | 0 refills | Status: DC

## 2016-12-30 MED ORDER — LOSARTAN POTASSIUM 25 MG PO TABS
25.0000 mg | ORAL_TABLET | Freq: Two times a day (BID) | ORAL | 2 refills | Status: DC
Start: 2016-12-30 — End: 2017-07-01

## 2016-12-30 MED ORDER — AMLODIPINE BESYLATE 5 MG PO TABS: 5.0000 mg | ORAL_TABLET | Freq: Two times a day (BID) | ORAL | 2 refills | Status: DC

## 2016-12-30 MED ORDER — HYDROCHLOROTHIAZIDE 12.5 MG PO TABS: 12.5000 mg | ORAL_TABLET | Freq: Every day | ORAL | 2 refills | Status: DC

## 2016-12-30 NOTE — Assessment & Plan Note (Signed)
No residual effect Discussed importance of weight control, BP control Continue ASA for now

## 2016-12-30 NOTE — Progress Notes (Signed)
Subjective:    Patient ID: Brad Mcgee, male    DOB: Jul 26, 1985, 31 y.o.   MRN: [id number]  HPI  Pt presents to the clinic today for his annual exam. He is also due to follow up chronic conditions.  HTN: His BP today is 150/100. He reports his BP normally runs 130/80-90. He is taking Amlodipine, HCTZ and Losartan as prescribed. ECG from 07/2015 reviewed.  OSA: He is not sleeping with a CPAP, he is sleeping on his stomach now. He averages 6 hours of sleep at night. He feels rested when he wakes up.  History of Hemorrhagic Stroke: No residual effect. He does have a filter and is taking a baby ASA daily.  Flu: never Tetanus: > 1 0years Dentist: as needed  Diet: He is currently on a keto diet. He does eat meat. He consumes some fruits and veggies daily. He does eat fried foods. He drinks mostly water. Exercise: He walks at work daily on his 15 minute breaks.  Review of Systems  Past Medical History:  Diagnosis Date  . CVA (cerebral vascular accident) (HCC)   . DVT (deep venous thrombosis) (HCC)   . Fracture of left ankle    treated with cast  . Hypertension     Current Outpatient Prescriptions  Medication Sig Dispense Refill  . amLODipine (NORVASC) 5 MG tablet Take 1 tablet by mouth 2 (two) times daily.    Marland Kitchen aspirin 81 MG tablet Take 81 mg by mouth daily.    . hydrochlorothiazide (HYDRODIURIL) 12.5 MG tablet Take 1 tablet (12.5 mg total) by mouth 2 (two) times daily. 60 tablet 0  . losartan (COZAAR) 25 MG tablet Take 1 tablet (25 mg total) by mouth daily. (Patient taking differently: Take 25 mg by mouth 2 (two) times daily. ) 30 tablet 0  . losartan (COZAAR) 25 MG tablet Take 1 tablet (25 mg total) by mouth daily. NO MORE REFILLS MUST SCHEDULE ANNUAL EXAM 90 tablet 0  . losartan (COZAAR) 25 MG tablet Take 1 tablet (25 mg total) by mouth daily. 90 tablet 0  . Multiple Vitamin (MULTIVITAMIN WITH MINERALS) TABS tablet Take 1 tablet by mouth daily. 100 tablet 0   No current  facility-administered medications for this visit.     Allergies  Allergen Reactions  . Bee Venom Anaphylaxis  . Coconut Oil Anaphylaxis  . Ancef [Cefazolin] Rash    Family History  Problem Relation Age of Onset  . Hypertension Mother   . Breast cancer Maternal Grandmother   . Hypertension Maternal Grandmother   . Prostate cancer Maternal Grandfather   . Hypertension Maternal Grandfather   . Hypertension Paternal Grandfather   . Diabetes Paternal Grandfather   . Multiple sclerosis Paternal Grandmother     Social History   Social History  . Marital status: Single    Spouse name: N/A  . Number of children: N/A  . Years of education: N/A   Occupational History  . Not on file.   Social History Main Topics  . Smoking status: Never Smoker  . Smokeless tobacco: Never Used  . Alcohol use No  . Drug use: No  . Sexual activity: No   Other Topics Concern  . Not on file   Social History Narrative  . No narrative on file     Constitutional: Pt reports weight gain. Denies fever, malaise, fatigue, headache.  HEENT: Denies eye pain, eye redness, ear pain, ringing in the ears, wax buildup, runny nose, nasal congestion, bloody nose, or  sore throat. Respiratory: Denies difficulty breathing, shortness of breath, cough or sputum production.   Cardiovascular: Denies chest pain, chest tightness, palpitations or swelling in the hands or feet.  Gastrointestinal: Denies abdominal pain, bloating, constipation, diarrhea or blood in the stool.  GU: Denies urgency, frequency, pain with urination, burning sensation, blood in urine, odor or discharge. Musculoskeletal: Denies decrease in range of motion, difficulty with gait, muscle pain or joint pain and swelling.  Skin: Denies redness, rashes, lesions or ulcercations.  Neurological: Denies dizziness, difficulty with memory, difficulty with speech or problems with balance and coordination.  Psych: Denies anxiety, depression, SI/HI.  No  other specific complaints in a complete review of systems (except as listed in HPI above).     Objective:   Physical Exam   BP (!) 150/100   Pulse 74   Temp 98 F (36.7 C) (Oral)   Ht 5' 7.75" (1.721 m)   Wt 288 lb (130.6 kg)   SpO2 98%   BMI 44.11 kg/m  Wt Readings from Last 3 Encounters:  12/30/16 288 lb (130.6 kg)  08/20/16 272 lb 9.6 oz (123.7 kg)  02/20/16 240 lb (108.9 kg)    General: Appears his stated age, obese in NAD. Skin: Warm, dry and intact. Trach scar with keloid noted on anterior neck.  HEENT: Head: normal shape and size; Eyes: sclera white, no icterus, conjunctiva pink, PERRLA and EOMs intact; Ears: Tm's gray and intact, normal light reflex; Throat/Mouth: Teeth present, mucosa pink and moist, no exudate, lesions or ulcerations noted.  Neck:  Neck supple, trachea midline. No masses, lumps or thyromegaly present.  Cardiovascular: Normal rate and rhythm. S1,S2 noted.  No murmur, rubs or gallops noted. No JVD or BLE edema.  Pulmonary/Chest: Normal effort and positive vesicular breath sounds. No respiratory distress. No wheezes, rales or ronchi noted.  Abdomen: Soft and nontender. Normal bowel sounds. No distention or masses noted. Liver, spleen and kidneys non palpable. Musculoskeletal: Strength 5/5 BUE/BLE. No difficulty with gait.  Neurological: Alert and oriented. Cranial nerves II-XII grossly intact. Coordination normal.  Psychiatric: Mood and affect normal. Behavior is normal. Judgment and thought content normal.     BMET    Component Value Date/Time   NA 138 01/26/2016 1225   K 3.6 01/26/2016 1225   CL 102 01/26/2016 1225   CO2 24 01/26/2016 1225   GLUCOSE 90 01/26/2016 1225   BUN 12 01/26/2016 1225   CREATININE 1.12 01/26/2016 1225   CALCIUM 10.0 01/26/2016 1225   GFRNONAA >60 01/26/2016 1225   GFRAA >60 01/26/2016 1225    Lipid Panel     Component Value Date/Time   TRIG 331 (H) 08/22/2015 0526    CBC    Component Value Date/Time   WBC  5.4 01/26/2016 1225   RBC 4.50 01/26/2016 1225   HGB 14.4 01/26/2016 1225   HCT 41.3 01/26/2016 1225   PLT 210 01/26/2016 1225   MCV 91.8 01/26/2016 1225   MCH 32.0 01/26/2016 1225   MCHC 34.9 01/26/2016 1225   RDW 12.6 01/26/2016 1225   LYMPHSABS 1.9 09/28/2015 1434   MONOABS 0.4 09/28/2015 1434   EOSABS 0.1 09/28/2015 1434   BASOSABS 0.0 09/28/2015 1434    Hgb A1C Lab Results  Component Value Date   HGBA1C 5.6 09/02/2015          Assessment & Plan:   Preventative Health Maintenance:  He declines flu shot and tetanus today Encouraged him to consume a balanced diet and exercise regimen Advised him to see  a dentist at least annually Will check CBC, CMET, Lipid, and A1C today  RTC in 1 year, sooner if needed Nicki Reaper, NP

## 2016-12-30 NOTE — Patient Instructions (Signed)

## 2016-12-30 NOTE — Assessment & Plan Note (Signed)
Elevated today, but generally okay at home Continue Amlodipine, HCTZ and Losartan as prescribed CMET today Discussed exercise for weight loss and DASH diet

## 2016-12-30 NOTE — Assessment & Plan Note (Signed)
Encouraged weight loss He does not wear with a CPAP machine

## 2016-12-30 NOTE — Addendum Note (Signed)
Addended by: Roena Malady on: 12/30/2016 09:34 AM   Modules accepted: Orders

## 2016-12-31 LAB — COMPREHENSIVE METABOLIC PANEL
ALT: 19 IU/L (ref 0–44)
AST: 20 IU/L (ref 0–40)
Albumin/Globulin Ratio: 1.3 (ref 1.2–2.2)
Albumin: 4.2 g/dL (ref 3.5–5.5)
Alkaline Phosphatase: 52 IU/L (ref 39–117)
BUN/Creatinine Ratio: 15 (ref 9–20)
BUN: 17 mg/dL (ref 6–20)
Bilirubin Total: 0.3 mg/dL (ref 0.0–1.2)
CO2: 19 mmol/L — ABNORMAL LOW (ref 20–29)
Calcium: 9.4 mg/dL (ref 8.7–10.2)
Chloride: 104 mmol/L (ref 96–106)
Creatinine, Ser: 1.13 mg/dL (ref 0.76–1.27)
GFR calc Af Amer: 99 mL/min/{1.73_m2} (ref >59)
GFR calc non Af Amer: 86 mL/min/{1.73_m2} (ref >59)
Globulin, Total: 3.3 g/dL (ref 1.5–4.5)
Glucose: 105 mg/dL — ABNORMAL HIGH (ref 65–99)
Potassium: 4.1 mmol/L (ref 3.5–5.2)
Sodium: 140 mmol/L (ref 134–144)
Total Protein: 7.5 g/dL (ref 6.0–8.5)

## 2016-12-31 LAB — LIPID PANEL
Chol/HDL Ratio: 5.4 ratio — ABNORMAL HIGH (ref 0.0–5.0)
Cholesterol, Total: 166 mg/dL (ref 100–199)
HDL: 31 mg/dL — ABNORMAL LOW (ref >39)
LDL Calculated: 113 mg/dL — ABNORMAL HIGH (ref 0–99)
Triglycerides: 108 mg/dL (ref 0–149)
VLDL Cholesterol Cal: 22 mg/dL (ref 5–40)

## 2016-12-31 LAB — HEMOGLOBIN A1C
Est. average glucose Bld gHb Est-mCnc: 117 mg/dL
Hgb A1c MFr Bld: 5.7 % — ABNORMAL HIGH (ref 4.8–5.6)

## 2016-12-31 LAB — CBC
Hematocrit: 44.2 % (ref 37.5–51.0)
Hemoglobin: 15.4 g/dL (ref 13.0–17.7)
MCH: 32.5 pg (ref 26.6–33.0)
MCHC: 34.8 g/dL (ref 31.5–35.7)
MCV: 93 fL (ref 79–97)
Platelets: 216 10*3/uL (ref 150–379)
RBC: 4.74 x10E6/uL (ref 4.14–5.80)
RDW: 13.5 % (ref 12.3–15.4)
WBC: 5 10*3/uL (ref 3.4–10.8)

## 2017-01-29 ENCOUNTER — Encounter: Payer: Self-pay | Admitting: Internal Medicine

## 2017-02-01 ENCOUNTER — Ambulatory Visit
Admission: EM | Admit: 2017-02-01 | Discharge: 2017-02-01 | Disposition: A | Discharge status: Home / Self Care | Payer: 59 | Attending: Emergency Medicine | Admitting: Emergency Medicine

## 2017-02-01 ENCOUNTER — Encounter: Payer: Self-pay | Admitting: Gynecology

## 2017-02-01 ENCOUNTER — Other Ambulatory Visit: Payer: Self-pay

## 2017-02-01 ENCOUNTER — Ambulatory Visit (INDEPENDENT_AMBULATORY_CARE_PROVIDER_SITE_OTHER): Payer: 59

## 2017-02-01 DIAGNOSIS — S60458A Superficial foreign body of other finger, initial encounter: Secondary | ICD-10-CM

## 2017-02-01 DIAGNOSIS — S60450A Superficial foreign body of right index finger, initial encounter: Secondary | ICD-10-CM | POA: Diagnosis not present

## 2017-02-01 DIAGNOSIS — Z23 Encounter for immunization: Secondary | ICD-10-CM

## 2017-02-01 MED ORDER — TETANUS-DIPHTH-ACELL PERTUSSIS 5-2.5-18.5 LF-MCG/0.5 IM SUSP
0.5000 mL | Freq: Once | INTRAMUSCULAR | Status: AC
  Administered 2017-02-01: 0.5 mL via INTRAMUSCULAR

## 2017-02-01 MED ORDER — LIDOCAINE-EPINEPHRINE-TETRACAINE (LET) SOLUTION
3.0000 mL | Freq: Once | NASAL | Status: AC
  Administered 2017-02-01: 3 mL via TOPICAL

## 2017-02-01 MED ORDER — AMOXICILLIN-POT CLAVULANATE 875-125 MG PO TABS: 1.0000 | ORAL_TABLET | Freq: Two times a day (BID) | ORAL | 0 refills | Status: DC

## 2017-02-01 MED ORDER — LIDOCAINE-EPINEPHRINE-TETRACAINE (LET) TOPICAL GEL: 3.0000 mL | Freq: Once | TOPICAL | Status: DC

## 2017-02-01 MED ORDER — IBUPROFEN 600 MG PO TABS: 600.0000 mg | ORAL_TABLET | Freq: Four times a day (QID) | ORAL | 0 refills | Status: DC | PRN

## 2017-02-01 NOTE — ED Triage Notes (Signed)
Per patient moving a bed and splinter from bed slot in right index finger

## 2017-02-01 NOTE — Discharge Instructions (Signed)
Finish the antibiotics unless your primary care provider tells you to stop.  600 mg ibuprofen with 1 g of Tylenol 3-4 times a day as needed for pain.  Go to the ER for any the signs and symptoms of infection that we talked about.

## 2017-02-01 NOTE — ED Provider Notes (Signed)
HPI  SUBJECTIVE:  Brad Mcgee is a right-handed 31 y.o. male who presents with a wooden splinter in his right middle finger.  States that he was moving a bed and got the splinter.  His friend tried pulling it out, but states that it broke off.  He reports constant, sore, throbbing pain.  No numbness, tingling, limitation of motion.  There are no other aggravating or alleviating factors.  He has a past medical history of hypertension, stroke, DVT.  No history of diabetes.  Tetanus unknown.  BMW:UXLKGPMD:Baity, Salvadore Oxfordegina W, NP   Past Medical History:  Diagnosis Date  . CVA (cerebral vascular accident) (HCC)   . DVT (deep venous thrombosis) (HCC)   . Fracture of left ankle    treated with cast  . Hypertension     Past Surgical History:  Procedure Laterality Date  . greenfield filter    . IR GENERIC HISTORICAL  01/26/2016   IR IVC FILTER RETRIEVAL / S&I Lenise Arena/IMG GUID/MOD SED 01/26/2016 Irish LackGlenn Yamagata, MD [facility]    Family History  Problem Relation Age of Onset  . Hypertension Mother   . Breast cancer Maternal Grandmother   . Hypertension Maternal Grandmother   . Prostate cancer Maternal Grandfather   . Hypertension Maternal Grandfather   . Hypertension Paternal Grandfather   . Diabetes Paternal Grandfather   . Multiple sclerosis Paternal Grandmother     Social History   Tobacco Use  . Smoking status: Never Smoker  . Smokeless tobacco: Never Used  Substance Use Topics  . Alcohol use: No  . Drug use: No     Current Facility-Administered Medications:  .  lidocaine-EPINEPHrine-tetracaine (LET) solution, 3 mL, Topical, Once, Domenick GongMortenson, Dechelle Attaway, MD  Current Outpatient Medications:  .  amLODipine (NORVASC) 5 MG tablet, Take 1 tablet (5 mg total) by mouth 2 (two) times daily., Disp: 180 tablet, Rfl: 2 .  aspirin 81 MG tablet, Take 81 mg by mouth daily., Disp: , Rfl:  .  hydrochlorothiazide (HYDRODIURIL) 12.5 MG tablet, Take 1 tablet (12.5 mg total) by mouth daily., Disp: 180 tablet,  Rfl: 2 .  losartan (COZAAR) 25 MG tablet, Take 1 tablet (25 mg total) by mouth 2 (two) times daily., Disp: 180 tablet, Rfl: 2 .  Multiple Vitamin (MULTIVITAMIN WITH MINERALS) TABS tablet, Take 1 tablet by mouth daily., Disp: 100 tablet, Rfl: 0 .  amoxicillin-clavulanate (AUGMENTIN) 875-125 MG tablet, Take 1 tablet 2 (two) times daily by mouth. X 10 days, Disp: 20 tablet, Rfl: 0 .  ibuprofen (ADVIL,MOTRIN) 600 MG tablet, Take 1 tablet (600 mg total) every 6 (six) hours as needed by mouth., Disp: 20 tablet, Rfl: 0  Allergies  Allergen Reactions  . Bee Venom Anaphylaxis  . Coconut Oil Anaphylaxis  . Ancef [Cefazolin] Rash     ROS  As noted in HPI.   Physical Exam  BP (!) 144/88 (BP Location: Left Arm)   Pulse 87   Temp 99.1 F (37.3 C) (Oral)   Resp 19   Ht 5\' 8"  (1.727 m)   Wt 296 lb 1.2 oz (134.3 kg)   SpO2 100%   BMI 45.02 kg/m   Constitutional: Well developed, well nourished, no acute distress Eyes:  EOMI, conjunctiva normal bilaterally HENT: Normocephalic, atraumatic,mucus membranes moist Respiratory: Normal inspiratory effort Cardiovascular: Normal rate GI: nondistended skin: No rash, skin intact Musculoskeletal: Puncture wound with visualized splinter in the pad of the right middle finger.  FDS, FDP intact.  Two-point discrimination intact.  Cap refill less than 2 seconds.  Neurologic: Alert & oriented x 3, no focal neuro deficits Psychiatric: Speech and behavior appropriate   ED Course   Medications  lidocaine-EPINEPHrine-tetracaine (LET) solution (not administered)  Tdap (BOOSTRIX) injection 0.5 mL (0.5 mLs Intramuscular Given 02/01/17 1411)    Orders Placed This Encounter  Procedures  . DG Finger Middle Right    Standing Status:   Standing    Number of Occurrences:   1    Order Specific Question:   Reason for Exam (SYMPTOM  OR DIAGNOSIS REQUIRED)    Answer:   eval for FB -known splinter    No results found for this or any previous visit (from the past  24 hour(s)). Dg Finger Middle Right  Result Date: 02/01/2017 CLINICAL DATA:  Evaluate for foreign body.  Splinter. EXAM: RIGHT MIDDLE FINGER 2+V COMPARISON:  None. FINDINGS: Three views of the right third digit are provided. No radiodense foreign body appreciated within the soft tissues. No osseous abnormality. IMPRESSION: Negative.  No foreign body seen. Electronically Signed   By: Bary RichardStan  Maynard M.D.   On: 02/01/2017 13:50    ED Clinical Impression  Acute foreign body of middle finger, initial encounter   ED Assessment/Plan  Updating tetanus.  X-ray independently reviewed no radiopaque foreign body seen on x-ray per radiology however I see a lucency in the pad of the finger.  See radiology report for details  Procedure note soak finger and iodine/tap water.  Performed digital block with 1% plain lidocaine with adequate anesthesia.  Using an 18-gauge needle and iris scissors, opened up the puncture wound.  Removed a 1 cm black wooden splinter in its entirety.  Explored wound further without any obvious foreign body remaining.  Pressure dressing, bacitracin placed.  Patient tolerated procedure well.  Advised patient that there may still be some residual material, but I did not visualize anything on exam.   Home with 10 days of Augmentin.  Patient states that he has tolerated penicillins before.  Also ibuprofen 600 mg.  Follow-up with PMD as needed.  To the ER if he gets worse.    Discussed  imaging, MDM, plan and followup with patient. Discussed sn/sx that should prompt return to the ED. patient agrees with plan.   Meds ordered this encounter  Medications  . Tdap (BOOSTRIX) injection 0.5 mL  . DISCONTD: lidocaine-EPINEPHrine-tetracaine (LET) topical gel  . lidocaine-EPINEPHrine-tetracaine (LET) solution  . amoxicillin-clavulanate (AUGMENTIN) 875-125 MG tablet    Sig: Take 1 tablet 2 (two) times daily by mouth. X 10 days    Dispense:  20 tablet    Refill:  0  . ibuprofen (ADVIL,MOTRIN)  600 MG tablet    Sig: Take 1 tablet (600 mg total) every 6 (six) hours as needed by mouth.    Dispense:  20 tablet    Refill:  0    *This clinic note was created using Scientist, clinical (histocompatibility and immunogenetics)Dragon dictation software. Therefore, there may be occasional mistakes despite careful proofreading.   ?    Domenick GongMortenson, Brynlei Klausner, MD 02/01/17 1506

## 2017-06-18 ENCOUNTER — Other Ambulatory Visit: Payer: Self-pay | Admitting: Internal Medicine

## 2017-06-18 ENCOUNTER — Encounter: Payer: Self-pay | Admitting: Internal Medicine

## 2017-06-18 NOTE — Telephone Encounter (Signed)
Please verify if pt is supposed to take QD or BID.Brad Mcgee.Brad Mcgee..Brad Mcgee

## 2017-06-19 NOTE — Telephone Encounter (Signed)
I think it should be BID. Can you call and ask him how he is taking it.

## 2017-07-01 ENCOUNTER — Other Ambulatory Visit: Payer: Self-pay | Admitting: Internal Medicine

## 2017-07-23 ENCOUNTER — Telehealth: Payer: Self-pay | Admitting: Internal Medicine

## 2017-07-23 NOTE — Telephone Encounter (Signed)
3 attempts to schedule fu appt from recall list.  Mailed Letter. Deleting recall.  ° °

## 2017-09-07 ENCOUNTER — Other Ambulatory Visit: Payer: Self-pay | Admitting: Internal Medicine

## 2017-09-23 ENCOUNTER — Telehealth: Payer: Self-pay | Admitting: Physical Medicine & Rehabilitation

## 2017-09-23 NOTE — Telephone Encounter (Signed)
Brad Mcgee, Brad Mcgee sent to Brad Mcgee, Brad Anil, Brad Mcgee        [facility], thanks,  Brad Mcgee.   Previous Messages    ----- Message -----  From: Brad Mcgee, Brad Anil, Brad Mcgee  Sent: 09/09/2017 10:55 AM  To: Brad GallowayLisa Mcgee Miller   That is fine in the interim, but his work should not be able to decline him for follow up stroke visit. They can call out office if there are any questions.   ----- Message -----  From: Brad Mcgee, Brad Mcgee  Sent: 09/09/2017 10:14 AM  To: Brad FennelAnkit Mcgee Patel, Brad Mcgee   Pt called to say that his work had declined him asking to get off for drs appt X 2.  He has to make his appts on Monday mornings to accommodate his work schedule. I have scheduled him with Riley LamEunice on 7/15 at 9a. Please let me know if that is ok?   Thanks, Brad Mcgee

## 2017-09-26 ENCOUNTER — Ambulatory Visit: Payer: 59 | Admitting: Physical Medicine & Rehabilitation

## 2017-09-29 ENCOUNTER — Encounter: Payer: Self-pay | Admitting: Registered Nurse

## 2017-09-29 ENCOUNTER — Other Ambulatory Visit: Payer: Self-pay

## 2017-09-29 ENCOUNTER — Encounter: Payer: 59 | Attending: Physical Medicine & Rehabilitation | Admitting: Registered Nurse

## 2017-09-29 VITALS — BP 155/98 | HR 63 | Ht 69.0 in | Wt 306.0 lb

## 2017-09-29 DIAGNOSIS — I619 Nontraumatic intracerebral hemorrhage, unspecified: Secondary | ICD-10-CM | POA: Diagnosis not present

## 2017-09-29 DIAGNOSIS — M79642 Pain in left hand: Secondary | ICD-10-CM | POA: Insufficient documentation

## 2017-09-29 DIAGNOSIS — I610 Nontraumatic intracerebral hemorrhage in hemisphere, subcortical: Secondary | ICD-10-CM | POA: Diagnosis not present

## 2017-09-29 DIAGNOSIS — Z8249 Family history of ischemic heart disease and other diseases of the circulatory system: Secondary | ICD-10-CM | POA: Diagnosis not present

## 2017-09-29 DIAGNOSIS — Z803 Family history of malignant neoplasm of breast: Secondary | ICD-10-CM | POA: Insufficient documentation

## 2017-09-29 DIAGNOSIS — Z8042 Family history of malignant neoplasm of prostate: Secondary | ICD-10-CM | POA: Diagnosis not present

## 2017-09-29 DIAGNOSIS — Z820 Family history of epilepsy and other diseases of the nervous system: Secondary | ICD-10-CM | POA: Insufficient documentation

## 2017-09-29 DIAGNOSIS — I10 Essential (primary) hypertension: Secondary | ICD-10-CM | POA: Insufficient documentation

## 2017-09-29 DIAGNOSIS — Z833 Family history of diabetes mellitus: Secondary | ICD-10-CM | POA: Diagnosis not present

## 2017-09-29 DIAGNOSIS — Z86718 Personal history of other venous thrombosis and embolism: Secondary | ICD-10-CM | POA: Diagnosis not present

## 2017-09-29 DIAGNOSIS — M79672 Pain in left foot: Secondary | ICD-10-CM | POA: Diagnosis not present

## 2017-09-29 DIAGNOSIS — Z9889 Other specified postprocedural states: Secondary | ICD-10-CM | POA: Insufficient documentation

## 2017-09-29 NOTE — Progress Notes (Signed)
Subjective:    Patient ID: Brad Mcgee, male    DOB: 06/20/1985, 32 y.o.   MRN: [id number]  HPI: Brad Mcgee is a 32 year old male who returns for follow up on his right basal ganglia hemorrhage. He states he has pain in his left hand and left foot. He rates his pain 5. His current exercise regime is attending [employer] 5-6 days a week, he using the treadmill for an hour, stationary bicycle for an hour, free weights and the cardio circuit.    Pain Inventory Average Pain 5 Pain Right Now 5 My pain is other  In the last 24 hours, has pain interfered with the following? General activity 0 Relation with others 0 Enjoyment of life 5 What TIME of day is your pain at its worst? all Sleep (in general) Fair  Pain is worse with: n/a Pain improves with: na Relief from Meds: 0  Mobility walk without assistance how many minutes can you walk? all day ability to climb steps?  yes do you drive?  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  no  Physicians involved in your care Any changes since last visit?  no   Family History  Problem Relation Age of Onset  . Hypertension Mother   . Breast cancer Maternal Grandmother   . Hypertension Maternal Grandmother   . Prostate cancer Maternal Grandfather   . Hypertension Maternal Grandfather   . Hypertension Paternal Grandfather   . Diabetes Paternal Grandfather   . Multiple sclerosis Paternal Grandmother    Social History   Socioeconomic History  . Marital status: Single    Spouse name: Not on file  . Number of children: Not on file  . Years of education: Not on file  . Highest education level: Not on file  Occupational History  . Not on file  Social Needs  . Financial resource strain: Not on file  . Food insecurity:    Worry: Not on file    Inability: Not on file  . Transportation needs:    Medical: Not on  file    Non-medical: Not on file  Tobacco Use  . Smoking status: Never Smoker  . Smokeless tobacco: Never Used  Substance and Sexual Activity  . Alcohol use: No  . Drug use: No  . Sexual activity: Never  Lifestyle  . Physical activity:    Days per week: Not on file    Minutes per session: Not on file  . Stress: Not on file  Relationships  . Social connections:    Talks on phone: Not on file    Gets together: Not on file    Attends religious service: Not on file    Active member of club or organization: Not on file    Attends meetings of clubs or organizations: Not on file    Relationship status: Not on file  Other Topics Concern  . Not on file  Social History Narrative  . Not on file   Past Surgical History:  Procedure Laterality Date  . greenfield filter    . IR GENERIC HISTORICAL  01/26/2016   IR IVC FILTER RETRIEVAL / S&I Lenise Arena/IMG GUID/MOD SED 01/26/2016 Irish LackGlenn Yamagata, MD [facility]   Past Medical History:  Diagnosis Date  . CVA (cerebral vascular accident) (HCC)   . DVT (deep venous thrombosis) (HCC)   . Fracture of left ankle  treated with cast  . Hypertension    BP (!) 155/98   Pulse 63   Ht 5\' 9"  (1.753 m)   Wt (!) 306 lb (138.8 kg)   SpO2 98%   BMI 45.19 kg/m   Opioid Risk Score:   Fall Risk Score:  `1  Depression screen PHQ 2/9  Depression screen [facility] 2/9 09/29/2017 12/30/2016 01/03/2016 11/09/2015 10/05/2015  Decreased Interest 0 0 0 0 0  Down, Depressed, Hopeless 0 0 0 0 0  PHQ - 2 Score 0 0 0 0 0  Altered sleeping - - - - 0  Tired, decreased energy - - - - 0  Change in appetite - - - - 0  Feeling bad or failure about yourself  - - - - 0  Trouble concentrating - - - - 0  Moving slowly or fidgety/restless - - - - 0  Suicidal thoughts - - - - 0  PHQ-9 Score - - - - 0    Review of Systems  Constitutional: Negative.   HENT: Negative.   Eyes: Negative.   Respiratory: Negative.   Cardiovascular: Negative.   Gastrointestinal: Negative.     Endocrine: Negative.   Genitourinary: Negative.   Musculoskeletal: Negative.   Skin: Negative.   Allergic/Immunologic: Negative.   Neurological: Negative.   Hematological: Negative.   Psychiatric/Behavioral: The patient is nervous/anxious.   All other systems reviewed and are negative.      Objective:   Physical Exam  Constitutional: He is oriented to person, place, and time. He appears well-developed and well-nourished.  HENT:  Head: Normocephalic and atraumatic.  Neck: Normal range of motion. Neck supple.  Cardiovascular: Normal rate and regular rhythm.  Pulmonary/Chest: Effort normal and breath sounds normal.  Musculoskeletal:  Normal Muscle Bulk and Muscle Testing Reveals: Upper Extremities: Full ROM and Muscle Strength 5/5 Lower Extremities: Full ROM and Muscle Strength 5/5 Arises from Table with Ease Narrow Based Gait  Neurological: He is alert and oriented to person, place, and time.  Skin: Skin is warm and dry.  Psychiatric: He has a normal mood and affect. His behavior is normal.  Nursing note and vitals reviewed.         Assessment & Plan:  1. Right Basal Ganglia Hemorrhage: Doing Well: Continue to Monitor 2. Benign Essential Hypertension: Continue current medication regimen. PCP Following.  3. Morbid Obesity: Continue with Healthy Diet Regime and HEP as Tolerated.   30 minutes of face to face patient care time was spent during this visit. All questions were encouraged and answered  F/U with Dr Allena Katz in  A year

## 2017-11-14 ENCOUNTER — Other Ambulatory Visit: Payer: Self-pay | Admitting: Internal Medicine

## 2017-12-17 ENCOUNTER — Encounter: Payer: Self-pay | Admitting: Internal Medicine

## 2018-01-12 ENCOUNTER — Encounter: Payer: Self-pay | Admitting: Internal Medicine

## 2018-01-12 ENCOUNTER — Ambulatory Visit (INDEPENDENT_AMBULATORY_CARE_PROVIDER_SITE_OTHER): Payer: BLUE CROSS/BLUE SHIELD | Admitting: Internal Medicine

## 2018-01-12 VITALS — BP 144/104 | HR 77 | Temp 98.1°F | Ht 67.5 in | Wt 307.0 lb

## 2018-01-12 DIAGNOSIS — I5032 Chronic diastolic (congestive) heart failure: Secondary | ICD-10-CM

## 2018-01-12 DIAGNOSIS — G8194 Hemiplegia, unspecified affecting left nondominant side: Secondary | ICD-10-CM

## 2018-01-12 DIAGNOSIS — G4733 Obstructive sleep apnea (adult) (pediatric): Secondary | ICD-10-CM

## 2018-01-12 DIAGNOSIS — Z0000 Encounter for general adult medical examination without abnormal findings: Secondary | ICD-10-CM

## 2018-01-12 DIAGNOSIS — I610 Nontraumatic intracerebral hemorrhage in hemisphere, subcortical: Secondary | ICD-10-CM

## 2018-01-12 DIAGNOSIS — I10 Essential (primary) hypertension: Secondary | ICD-10-CM

## 2018-01-12 LAB — CBC
HCT: 44.7 % (ref 39.0–52.0)
Hemoglobin: 15.7 g/dL (ref 13.0–17.0)
MCHC: 35.2 g/dL (ref 30.0–36.0)
MCV: 95.8 fl (ref 78.0–100.0)
Platelets: 231 10*3/uL (ref 150.0–400.0)
RBC: 4.67 Mil/uL (ref 4.22–5.81)
RDW: 13.3 % (ref 11.5–15.5)
WBC: 5.7 10*3/uL (ref 4.0–10.5)

## 2018-01-12 LAB — LIPID PANEL
Cholesterol: 190 mg/dL (ref 0–200)
HDL: 34.8 mg/dL — ABNORMAL LOW (ref >39.00)
LDL Cholesterol: 119 mg/dL — ABNORMAL HIGH (ref 0–99)
NonHDL: 155.34
Total CHOL/HDL Ratio: 5
Triglycerides: 182 mg/dL — ABNORMAL HIGH (ref 0.0–149.0)
VLDL: 36.4 mg/dL (ref 0.0–40.0)

## 2018-01-12 LAB — COMPREHENSIVE METABOLIC PANEL
ALT: 25 U/L (ref 0–53)
AST: 17 U/L (ref 0–37)
Albumin: 4.2 g/dL (ref 3.5–5.2)
Alkaline Phosphatase: 63 U/L (ref 39–117)
BUN: 17 mg/dL (ref 6–23)
CO2: 27 mEq/L (ref 19–32)
Calcium: 9.6 mg/dL (ref 8.4–10.5)
Chloride: 102 mEq/L (ref 96–112)
Creatinine, Ser: 1.17 mg/dL (ref 0.40–1.50)
GFR: 92.05 mL/min (ref >60.00)
Glucose, Bld: 122 mg/dL — ABNORMAL HIGH (ref 70–99)
Potassium: 3.9 mEq/L (ref 3.5–5.1)
Sodium: 137 mEq/L (ref 135–145)
Total Bilirubin: 0.3 mg/dL (ref 0.2–1.2)
Total Protein: 7.3 g/dL (ref 6.0–8.3)

## 2018-01-12 LAB — HEMOGLOBIN A1C: Hgb A1c MFr Bld: 6.2 % (ref 4.6–6.5)

## 2018-01-12 MED ORDER — LOSARTAN POTASSIUM 50 MG PO TABS: 50.0000 mg | ORAL_TABLET | Freq: Every day | ORAL | 3 refills | Status: DC

## 2018-01-12 NOTE — Assessment & Plan Note (Signed)
Persistent but not bothersome He is still able to work

## 2018-01-12 NOTE — Progress Notes (Signed)
Subjective:    Patient ID: Brad Mcgee, male    DOB: [birth year], 32 y.o.   MRN: [id number]  HPI  Pt presents to the clinic today for his annual exam. He is also due to follow up chronic conditions.  HTN: His BP today is 142/106. He is taking Amlodipine, Losartan and HCTZ as prescribed. ECG from 07/2015 reviewed.  Hx of Stroke: With residual left side weakness. His last LDL was 113, 12/2016. He is not on any cholesterol lowering medications at this time. BP controlled with Amlodipine, Losartan and HCTZ. He is taking ASA 81 mg daily.  OSA: He averages 7 hours of sleep per night without use of his CPAP. He feels rested when he wakes up. Sleep study from 10/2015 reviewed.  CHF, Diastolic: Echo from 07/2015 reviewed. He denies edema or shortness of breath. He is taking Amlodipine, Losartan and HCTZ as prescribed.  Flu: never Tetanus: 01/2017 Dentist: as needed  Diet: He does eat meat. He consumes more veggies than fruits. He does eat fried foods. He drinks mostly water. Exercise: None  Review of Systems      Past Medical History:  Diagnosis Date  . CVA (cerebral vascular accident) (HCC)   . DVT (deep venous thrombosis) (HCC)   . Fracture of left ankle    treated with cast  . Hypertension     Current Outpatient Medications  Medication Sig Dispense Refill  . amLODipine (NORVASC) 5 MG tablet Take 1 tablet (5 mg total) by mouth 2 (two) times daily. 180 tablet 2  . aspirin 81 MG tablet Take 81 mg by mouth daily.    . hydrochlorothiazide (HYDRODIURIL) 12.5 MG tablet Take 1 tablet (12.5 mg total) by mouth daily. 180 tablet 2  . losartan (COZAAR) 25 MG tablet TAKE 1 TABLET BY MOUTH TWO  TIMES DAILY 180 tablet 0   No current facility-administered medications for this visit.     Allergies  Allergen Reactions  . Bee Venom Anaphylaxis  . Coconut Oil Anaphylaxis  . Ancef [Cefazolin] Rash    Family History  Problem Relation Age of Onset  . Hypertension Mother   . Breast cancer  Maternal Grandmother   . Hypertension Maternal Grandmother   . Prostate cancer Maternal Grandfather   . Hypertension Maternal Grandfather   . Hypertension Paternal Grandfather   . Diabetes Paternal Grandfather   . Multiple sclerosis Paternal Grandmother     Social History   Socioeconomic History  . Marital status: Single    Spouse name: Not on file  . Number of children: Not on file  . Years of education: Not on file  . Highest education level: Not on file  Occupational History  . Not on file  Social Needs  . Financial resource strain: Not on file  . Food insecurity:    Worry: Not on file    Inability: Not on file  . Transportation needs:    Medical: Not on file    Non-medical: Not on file  Tobacco Use  . Smoking status: Never Smoker  . Smokeless tobacco: Never Used  Substance and Sexual Activity  . Alcohol use: No  . Drug use: No  . Sexual activity: Never  Lifestyle  . Physical activity:    Days per week: Not on file    Minutes per session: Not on file  . Stress: Not on file  Relationships  . Social connections:    Talks on phone: Not on file    Gets together: Not on file  Attends religious service: Not on file    Active member of club or organization: Not on file    Attends meetings of clubs or organizations: Not on file    Relationship status: Not on file  . Intimate partner violence:    Fear of current or ex partner: Not on file    Emotionally abused: Not on file    Physically abused: Not on file    Forced sexual activity: Not on file  Other Topics Concern  . Not on file  Social History Narrative  . Not on file     Constitutional: Denies fever, malaise, fatigue, headache or abrupt weight changes.  HEENT: Denies eye pain, eye redness, ear pain, ringing in the ears, wax buildup, runny nose, nasal congestion, bloody nose, or sore throat. Respiratory: Denies difficulty breathing, shortness of breath, cough or sputum production.   Cardiovascular: Denies  chest pain, chest tightness, palpitations or swelling in the hands or feet.  Gastrointestinal: Denies abdominal pain, bloating, constipation, diarrhea or blood in the stool.  GU: Denies urgency, frequency, pain with urination, burning sensation, blood in urine, odor or discharge. Musculoskeletal: Pt reports left sided weakness. Denies decrease in range of motion, difficulty with gait, muscle pain or joint pain and swelling.  Skin: Denies redness, rashes, lesions or ulcercations.  Neurological: Denies dizziness, difficulty with memory, difficulty with speech or problems with balance and coordination.  Psych: Denies anxiety, depression, SI/HI.  No other specific complaints in a complete review of systems (except as listed in HPI above).  Objective:   Physical Exam  BP (!) 142/106   Pulse 77   Temp 98.1 F (36.7 C) (Oral)   Ht 5' 7.5" (1.715 m)   Wt (!) 307 lb (139.3 kg)   SpO2 97%   BMI 47.37 kg/m  Wt Readings from Last 3 Encounters:  01/12/18 (!) 307 lb (139.3 kg)  09/29/17 (!) 306 lb (138.8 kg)  02/01/17 296 lb 1.2 oz (134.3 kg)    General: Appears his stated age, obese, in NAD. Skin: Warm, dry and intact. No rashes, lesions or ulcerations noted. HEENT: Head: normal shape and size; Eyes: sclera white, no icterus, conjunctiva pink, PERRLA and EOMs intact; Ears: Tm's gray and intact, normal light reflex; Throat/Mouth: Teeth present, mucosa pink and moist, no exudate, lesions or ulcerations noted.  Neck:  Neck supple, trachea midline. No masses, lumps or thyromegaly present.  Cardiovascular: Normal rate and rhythm. S1,S2 noted.  No murmur, rubs or gallops noted. No JVD or BLE edema.  Pulmonary/Chest: Normal effort and positive vesicular breath sounds. No respiratory distress. No wheezes, rales or ronchi noted.  Abdomen: Soft and nontender. Normal bowel sounds. No distention or masses noted. Liver, spleen and kidneys non palpable. Musculoskeletal: Strength 4/5 LUE,/LLE. Strength 5/5  RUE/RLE. No difficulty with gait.  Neurological: Alert and oriented. Cranial nerves II-XII grossly intact. Coordination normal.  Psychiatric: Mood and affect normal. Behavior is normal. Judgment and thought content normal.     BMET    Component Value Date/Time   NA 140 12/30/2016 1001   K 4.1 12/30/2016 1001   CL 104 12/30/2016 1001   CO2 19 (L) 12/30/2016 1001   GLUCOSE 105 (H) 12/30/2016 1001   GLUCOSE 90 01/26/2016 1225   BUN 17 12/30/2016 1001   CREATININE 1.13 12/30/2016 1001   CALCIUM 9.4 12/30/2016 1001   GFRNONAA 86 12/30/2016 1001   GFRAA 99 12/30/2016 1001    Lipid Panel     Component Value Date/Time   CHOL 166  12/30/2016 1001   TRIG 108 12/30/2016 1001   HDL 31 (L) 12/30/2016 1001   CHOLHDL 5.4 (H) 12/30/2016 1001   LDLCALC 113 (H) 12/30/2016 1001    CBC    Component Value Date/Time   WBC 5.0 12/30/2016 1001   WBC 5.4 01/26/2016 1225   RBC 4.74 12/30/2016 1001   RBC 4.50 01/26/2016 1225   HGB 15.4 12/30/2016 1001   HCT 44.2 12/30/2016 1001   PLT 216 12/30/2016 1001   MCV 93 12/30/2016 1001   MCH 32.5 12/30/2016 1001   MCH 32.0 01/26/2016 1225   MCHC 34.8 12/30/2016 1001   MCHC 34.9 01/26/2016 1225   RDW 13.5 12/30/2016 1001   LYMPHSABS 1.9 09/28/2015 1434   MONOABS 0.4 09/28/2015 1434   EOSABS 0.1 09/28/2015 1434   BASOSABS 0.0 09/28/2015 1434    Hgb A1C Lab Results  Component Value Date   HGBA1C 5.7 (H) 12/30/2016            Assessment & Plan:   Preventative Health Maintenance:  He declines flu shot today Tetanus UTD Encouraged him to consume a balanced diet and exercise regimen Advised him to see a dentist annually Will check CBC, CMET, Lipid and A1C today  RTC in 3 weeks for follow up HTN Nicki Reaper, NP

## 2018-01-12 NOTE — Assessment & Plan Note (Signed)
Uncontrolled Increase Losartan to 50 mg daily Continue Amlodipine and HCTZ Reinforced DASH diet CBC and CMET today

## 2018-01-12 NOTE — Assessment & Plan Note (Signed)
Compensated Continue Amlodipine, Losartan and HCTZ

## 2018-01-12 NOTE — Assessment & Plan Note (Signed)
He reports he is sleeping well without CPAP He does not follow with pulmonology Encouraged weight loss and discussed how this could help his OSA

## 2018-01-12 NOTE — Assessment & Plan Note (Signed)
CBC, CMET and Lipid profile today Encouraged him to consume a low fat diet Continue Amlodipine, HCTZ, ASA Increased Losartan to 50 mg for better HTN control Encouraged regular physical exercise

## 2018-01-12 NOTE — Patient Instructions (Signed)

## 2018-01-14 ENCOUNTER — Encounter: Payer: Self-pay | Admitting: Internal Medicine

## 2018-01-15 ENCOUNTER — Encounter: Payer: Self-pay | Admitting: Internal Medicine

## 2018-02-03 ENCOUNTER — Encounter: Payer: Self-pay | Admitting: Internal Medicine

## 2018-02-03 ENCOUNTER — Ambulatory Visit: Payer: BLUE CROSS/BLUE SHIELD | Admitting: Internal Medicine

## 2018-02-03 DIAGNOSIS — I10 Essential (primary) hypertension: Secondary | ICD-10-CM | POA: Diagnosis not present

## 2018-02-03 MED ORDER — LOSARTAN POTASSIUM 50 MG PO TABS: 50.0000 mg | ORAL_TABLET | Freq: Every day | ORAL | 1 refills | Status: DC

## 2018-02-03 NOTE — Progress Notes (Signed)
Subjective:    Patient ID: Brad Mcgee, male    DOB: 01/22/1986, 32 y.o.   MRN: [id number]  HPI  Pt presents to the clinic today for 3 week follow up of HTN. At his last visit, his Losartan was increased to 50 mg daily, in addition to his Amlodipine and HCTZ. He has been taking the medication as prescribed. He denies adverse effects. His BP today is 132/86. ECG from 07/2015 reviewed.  Review of Systems  Past Medical History:  Diagnosis Date  . CVA (cerebral vascular accident) (HCC)   . DVT (deep venous thrombosis) (HCC)   . Fracture of left ankle    treated with cast  . Hypertension     Current Outpatient Medications  Medication Sig Dispense Refill  . amLODipine (NORVASC) 5 MG tablet Take 1 tablet (5 mg total) by mouth 2 (two) times daily. 180 tablet 2  . aspirin 81 MG tablet Take 81 mg by mouth daily.    . hydrochlorothiazide (HYDRODIURIL) 12.5 MG tablet Take 1 tablet (12.5 mg total) by mouth daily. 180 tablet 2  . losartan (COZAAR) 50 MG tablet Take 1 tablet (50 mg total) by mouth daily. 90 tablet 3   No current facility-administered medications for this visit.     Allergies  Allergen Reactions  . Bee Venom Anaphylaxis  . Coconut Oil Anaphylaxis  . Ancef [Cefazolin] Rash    Family History  Problem Relation Age of Onset  . Hypertension Mother   . Breast cancer Maternal Grandmother   . Hypertension Maternal Grandmother   . Prostate cancer Maternal Grandfather   . Hypertension Maternal Grandfather   . Hypertension Paternal Grandfather   . Diabetes Paternal Grandfather   . Multiple sclerosis Paternal Grandmother     Social History   Socioeconomic History  . Marital status: Single    Spouse name: Not on file  . Number of children: Not on file  . Years of education: Not on file  . Highest education level: Not on file  Occupational History  . Not on file  Social Needs  . Financial resource strain: Not on file  . Food insecurity:    Worry: Not on file   Inability: Not on file  . Transportation needs:    Medical: Not on file    Non-medical: Not on file  Tobacco Use  . Smoking status: Never Smoker  . Smokeless tobacco: Never Used  Substance and Sexual Activity  . Alcohol use: No  . Drug use: No  . Sexual activity: Never  Lifestyle  . Physical activity:    Days per week: Not on file    Minutes per session: Not on file  . Stress: Not on file  Relationships  . Social connections:    Talks on phone: Not on file    Gets together: Not on file    Attends religious service: Not on file    Active member of club or organization: Not on file    Attends meetings of clubs or organizations: Not on file    Relationship status: Not on file  . Intimate partner violence:    Fear of current or ex partner: Not on file    Emotionally abused: Not on file    Physically abused: Not on file    Forced sexual activity: Not on file  Other Topics Concern  . Not on file  Social History Narrative  . Not on file     Constitutional: Denies fever, malaise, fatigue, headache or abrupt  weight changes.  Respiratory: Denies difficulty breathing, shortness of breath, cough or sputum production.   Cardiovascular: Denies chest pain, chest tightness, palpitations or swelling in the hands or feet.  Neurological: Denies dizziness, difficulty with memory, difficulty with speech or problems with balance and coordination.    No other specific complaints in a complete review of systems (except as listed in HPI above).     Objective:   Physical Exam   BP 132/86   Pulse 79   Temp 98.2 F (36.8 C) (Oral)   Wt (!) 302 lb (137 kg)   SpO2 98%   BMI 46.60 kg/m  Wt Readings from Last 3 Encounters:  02/03/18 (!) 302 lb (137 kg)  01/12/18 (!) 307 lb (139.3 kg)  09/29/17 (!) 306 lb (138.8 kg)    General: Appears his stated age, obese, in NAD. Cardiovascular: Normal rate and rhythm. S1,S2 noted.  No murmur, rubs or gallops noted.  Pulmonary/Chest: Normal effort  and positive vesicular breath sounds. No respiratory distress. No wheezes, rales or ronchi noted.  ANeurological: Alert and oriented.   BMET    Component Value Date/Time   NA 137 01/12/2018 0832   NA 140 12/30/2016 1001   K 3.9 01/12/2018 0832   CL 102 01/12/2018 0832   CO2 27 01/12/2018 0832   GLUCOSE 122 (H) 01/12/2018 0832   BUN 17 01/12/2018 0832   BUN 17 12/30/2016 1001   CREATININE 1.17 01/12/2018 0832   CALCIUM 9.6 01/12/2018 0832   GFRNONAA 86 12/30/2016 1001   GFRAA 99 12/30/2016 1001    Lipid Panel     Component Value Date/Time   CHOL 190 01/12/2018 0832   CHOL 166 12/30/2016 1001   TRIG 182.0 (H) 01/12/2018 0832   HDL 34.80 (L) 01/12/2018 0832   HDL 31 (L) 12/30/2016 1001   CHOLHDL 5 01/12/2018 0832   VLDL 36.4 01/12/2018 0832   LDLCALC 119 (H) 01/12/2018 0832   LDLCALC 113 (H) 12/30/2016 1001    CBC    Component Value Date/Time   WBC 5.7 01/12/2018 0832   RBC 4.67 01/12/2018 0832   HGB 15.7 01/12/2018 0832   HGB 15.4 12/30/2016 1001   HCT 44.7 01/12/2018 0832   HCT 44.2 12/30/2016 1001   PLT 231.0 01/12/2018 0832   PLT 216 12/30/2016 1001   MCV 95.8 01/12/2018 0832   MCV 93 12/30/2016 1001   MCH 32.5 12/30/2016 1001   MCH 32.0 01/26/2016 1225   MCHC 35.2 01/12/2018 0832   RDW 13.3 01/12/2018 0832   RDW 13.5 12/30/2016 1001   LYMPHSABS 1.9 09/28/2015 1434   MONOABS 0.4 09/28/2015 1434   EOSABS 0.1 09/28/2015 1434   BASOSABS 0.0 09/28/2015 1434    Hgb A1C Lab Results  Component Value Date   HGBA1C 6.2 01/12/2018           Assessment & Plan:

## 2018-02-03 NOTE — Patient Instructions (Signed)

## 2018-02-03 NOTE — Assessment & Plan Note (Signed)
Better control with increased Losartan Will refill today Continue Amlodipine and HCTZ Reinforced DASH diet and exercise for weight loss  RTC 12/2018 for annual exam, sooner if needed Nicki Reaperegina Kaveh Kissinger, NP

## 2018-02-05 ENCOUNTER — Encounter: Payer: Self-pay | Admitting: Internal Medicine

## 2018-02-05 MED ORDER — HYDROCHLOROTHIAZIDE 12.5 MG PO TABS: 12.5000 mg | ORAL_TABLET | Freq: Every day | ORAL | 2 refills | Status: DC

## 2018-06-05 ENCOUNTER — Encounter: Payer: Self-pay | Admitting: Internal Medicine

## 2018-06-05 MED ORDER — HYDROCHLOROTHIAZIDE 12.5 MG PO TABS: 12.5000 mg | ORAL_TABLET | Freq: Every day | ORAL | 0 refills | Status: DC

## 2018-06-05 MED ORDER — AMLODIPINE BESYLATE 5 MG PO TABS: 5.0000 mg | ORAL_TABLET | Freq: Two times a day (BID) | ORAL | 0 refills | Status: DC

## 2018-07-04 ENCOUNTER — Ambulatory Visit
Admission: EM | Admit: 2018-07-04 | Discharge: 2018-07-04 | Disposition: A | Discharge status: Home / Self Care | Payer: BLUE CROSS/BLUE SHIELD | Attending: Family Medicine | Admitting: Family Medicine

## 2018-07-04 ENCOUNTER — Other Ambulatory Visit: Payer: Self-pay

## 2018-07-04 DIAGNOSIS — M2141 Flat foot [pes planus] (acquired), right foot: Secondary | ICD-10-CM

## 2018-07-04 DIAGNOSIS — M2142 Flat foot [pes planus] (acquired), left foot: Secondary | ICD-10-CM

## 2018-07-04 DIAGNOSIS — M25572 Pain in left ankle and joints of left foot: Secondary | ICD-10-CM

## 2018-07-04 MED ORDER — MELOXICAM 15 MG PO TABS: 15.0000 mg | ORAL_TABLET | Freq: Every day | ORAL | 0 refills | Status: DC | PRN

## 2018-07-04 NOTE — ED Triage Notes (Signed)
Pt started a new job on Monday. Stands on concrete 10 hours a day. Bilateral ankle aching and worse on the left. Had ankle pain after his stroke but started bothering him again after starting the new job.

## 2018-07-04 NOTE — ED Provider Notes (Signed)
[facility]    CSN: [id number] Arrival date & time: 07/04/18  1046  History   Chief Complaint Chief Complaint  Patient presents with  . Ankle Pain   HPI  33 year old male with a prior history of ankle fracture as well as stroke presents with ankle pain.  Patient states that he has ongoing ankle pain.  Worsened after recently starting a new job where he is on his feet for 10 hours a day.  Reports that both of his ankles are bothering him however the left is worse.  He denies right ankle pain at this time.  His left ankle pain is located on the medial aspect.  He has decreased range of motion of his left ankle secondary to stroke which has affected his left side.  Patient is ambulating with a cane.  Patient has tried shoe inserts without resolution.  Exacerbated by prolonged standing and activity.  No relieving factors.  No other complaints.  PMH, Surgical Hx, Family Hx, Social History reviewed and updated as below.  Past Medical History:  Diagnosis Date  . CVA (cerebral vascular accident) (HCC)   . DVT (deep venous thrombosis) (HCC)   . Fracture of left ankle    treated with cast  . Hypertension    Patient Active Problem List   Diagnosis Date Noted  . Left hemiparesis (HCC) 01/12/2018  . CHF (congestive heart failure), NYHA class II, chronic, diastolic (HCC) 01/12/2018  . OSA (obstructive sleep apnea) 12/30/2016  . Deep venous thrombosis (DVT) of left peroneal vein 09/01/2015  . Right Basal ganglia hemorrhage (HCC)   . Essential (primary) hypertension 08/16/2015   Past Surgical History:  Procedure Laterality Date  . greenfield filter    . IR GENERIC HISTORICAL  01/26/2016   IR IVC FILTER RETRIEVAL / S&I Lenise Arena/IMG GUID/MOD SED 01/26/2016 Irish LackGlenn Yamagata, MD [facility]   Home Medications    Prior to Admission medications   Medication Sig Start Date End Date Taking? Authorizing Provider  aspirin 81 MG tablet Take 81 mg by mouth daily.    [provider]   hydrochlorothiazide (HYDRODIURIL) 12.5 MG tablet Take 1 tablet (12.5 mg total) by mouth daily. 06/05/18   Lorre MunroeBaity, Regina W, NP  losartan (COZAAR) 50 MG tablet Take 1 tablet (50 mg total) by mouth daily. 02/03/18   Lorre MunroeBaity, Regina W, NP  meloxicam (MOBIC) 15 MG tablet Take 1 tablet (15 mg total) by mouth daily as needed. 07/04/18   Tommie Samsook, Lue Sykora G, DO   Family History Family History  Problem Relation Age of Onset  . Hypertension Mother   . Breast cancer Maternal Grandmother   . Hypertension Maternal Grandmother   . Prostate cancer Maternal Grandfather   . Hypertension Maternal Grandfather   . Hypertension Paternal Grandfather   . Diabetes Paternal Grandfather   . Multiple sclerosis Paternal Grandmother    Social History Social History   Tobacco Use  . Smoking status: Never Smoker  . Smokeless tobacco: Never Used  Substance Use Topics  . Alcohol use: No  . Drug use: No   Allergies   Bee venom; Coconut oil; and Ancef [cefazolin]   Review of Systems Review of Systems  Constitutional: Negative.   Musculoskeletal:       Ankle pain.   Physical Exam Triage Vital Signs ED Triage Vitals  Enc Vitals Group     BP 07/04/18 1100 (!) 146/82     Pulse Rate 07/04/18 1100 70     Resp 07/04/18 1100 18  Temp 07/04/18 1100 97.6 F (36.4 C)     Temp Source 07/04/18 1100 Oral     SpO2 07/04/18 1100 100 %     Weight 07/04/18 1101 275 lb (124.7 kg)     Height 07/04/18 1101  (1.727 m)     Head Circumference --      Peak Flow --      Pain Score 07/04/18 1101 4     Pain Loc --      Pain Edu? --      Excl. in GC? --    Updated Vital Signs BP (!) 146/82 (BP Location: Right Arm)   Pulse 70   Temp 97.6 F (36.4 C) (Oral)   Resp 18   Ht  (1.727 m)   Wt 124.7 kg   SpO2 100%   BMI 41.81 kg/m   Visual Acuity Right Eye Distance:   Left Eye Distance:   Bilateral Distance:    Right Eye Near:   Left Eye Near:    Bilateral Near:     Physical Exam Vitals signs and nursing  note reviewed.  Constitutional:      General: He is not in acute distress.    Appearance: Normal appearance. He is obese.  HENT:     Head: Normocephalic and atraumatic.  Eyes:     General:        Right eye: No discharge.        Left eye: No discharge.     Conjunctiva/sclera: Conjunctivae normal.  Cardiovascular:     Rate and Rhythm: Normal rate and regular rhythm.  Pulmonary:     Effort: Pulmonary effort is normal.     Breath sounds: Normal breath sounds.  Musculoskeletal:     Comments: Left ankle & foot -severe pes planus.  Patient with mild tenderness over the medial aspect of the ankle.  Markedly decreased range of motion in all planes.  Neurological:     Mental Status: He is alert.  Psychiatric:        Mood and Affect: Mood normal.        Behavior: Behavior normal.    UC Treatments / Results  Labs (all labs ordered are listed, but only abnormal results are displayed) Labs Reviewed - No data to display  EKG None  Radiology No results found.  Procedures Procedures (including critical care time)  Medications Ordered in UC Medications - No data to display  Initial Impression / Assessment and Plan / UC Course  I have reviewed the triage vital signs and the nursing notes.  Pertinent labs & imaging results that were available during my care of the patient were reviewed by me and considered in my medical decision making (see chart for details).    33 year old male presents with ankle pain and severe pes planus.  Meloxicam as needed.  Advised to see podiatry as I feel that he needs custom orthotics and further intervention from a specialist given pes planus and markedly decreased range of motion of the ankle.  Final Clinical Impressions(s) / UC Diagnoses   Final diagnoses:  Acute left ankle pain  Pes planus of both feet     Discharge Instructions     Medication as directed.  See Podiatry - Gavin Potters ([place] or [facility]).  Take care  Dr. Adriana Simas    ED  Prescriptions    Medication Sig Dispense Auth. Provider   meloxicam (MOBIC) 15 MG tablet Take 1 tablet (15 mg total) by mouth daily as needed. 14 tablet  Tommie Sams, DO     Controlled Substance Prescriptions [place] Controlled Substance Registry consulted? Not Applicable   Tommie Sams, DO 07/04/18 1150

## 2018-07-04 NOTE — Discharge Instructions (Signed)
Medication as directed.  See Podiatry - Gavin Potters ([place] or [facility]).  Take care  Dr. Adriana Simas

## 2018-07-08 ENCOUNTER — Encounter: Payer: Self-pay | Admitting: Internal Medicine

## 2018-07-13 ENCOUNTER — Encounter: Payer: Self-pay | Admitting: Internal Medicine

## 2018-07-13 ENCOUNTER — Other Ambulatory Visit: Payer: Self-pay

## 2018-07-13 ENCOUNTER — Ambulatory Visit (INDEPENDENT_AMBULATORY_CARE_PROVIDER_SITE_OTHER): Payer: Self-pay | Admitting: Internal Medicine

## 2018-07-13 DIAGNOSIS — R7303 Prediabetes: Secondary | ICD-10-CM | POA: Insufficient documentation

## 2018-07-13 DIAGNOSIS — G4733 Obstructive sleep apnea (adult) (pediatric): Secondary | ICD-10-CM

## 2018-07-13 DIAGNOSIS — I5032 Chronic diastolic (congestive) heart failure: Secondary | ICD-10-CM

## 2018-07-13 DIAGNOSIS — I610 Nontraumatic intracerebral hemorrhage in hemisphere, subcortical: Secondary | ICD-10-CM

## 2018-07-13 DIAGNOSIS — Z8673 Personal history of transient ischemic attack (TIA), and cerebral infarction without residual deficits: Secondary | ICD-10-CM

## 2018-07-13 DIAGNOSIS — G8194 Hemiplegia, unspecified affecting left nondominant side: Secondary | ICD-10-CM

## 2018-07-13 DIAGNOSIS — I10 Essential (primary) hypertension: Secondary | ICD-10-CM

## 2018-07-13 DIAGNOSIS — E119 Type 2 diabetes mellitus without complications: Secondary | ICD-10-CM | POA: Insufficient documentation

## 2018-07-13 NOTE — Patient Instructions (Signed)
Prediabetes Eating Plan  Prediabetes is a condition that causes blood sugar (glucose) levels to be higher than normal. This increases the risk for developing diabetes. In order to prevent diabetes from developing, your health care provider may recommend a diet and other lifestyle changes to help you:  · Control your blood glucose levels.  · Improve your cholesterol levels.  · Manage your blood pressure.  Your health care provider may recommend working with a diet and nutrition specialist (dietitian) to make a meal plan that is best for you.  What are tips for following this plan?  Lifestyle  · Set weight loss goals with the help of your health care team. It is recommended that most people with prediabetes lose 7% of their current body weight.  · Exercise for at least 30 minutes at least 5 days a week.  · Attend a support group or seek ongoing support from a mental health counselor.  · Take over-the-counter and prescription medicines only as told by your health care provider.  Reading food labels  · Read food labels to check the amount of fat, salt (sodium), and sugar in prepackaged foods. Avoid foods that have:  ? Saturated fats.  ? Trans fats.  ? Added sugars.  · Avoid foods that have more than 300 milligrams (mg) of sodium per serving. Limit your daily sodium intake to less than 2,300 mg each day.  Shopping  · Avoid buying pre-made and processed foods.  Cooking  · Cook with olive oil. Do not use butter, lard, or ghee.  · Bake, broil, grill, or boil foods. Avoid frying.  Meal planning    · Work with your dietitian to develop an eating plan that is right for you. This may include:  ? Tracking how many calories you take in. Use a food diary, notebook, or mobile application to track what you eat at each meal.  ? Using the glycemic index (GI) to plan your meals. The index tells you how quickly a food will raise your blood glucose. Choose low-GI foods. These foods take a longer time to raise blood glucose.  · Consider  following a Mediterranean diet. This diet includes:  ? Several servings each day of fresh fruits and vegetables.  ? Eating fish at least twice a week.  ? Several servings each day of whole grains, beans, nuts, and seeds.  ? Using olive oil instead of other fats.  ? Moderate alcohol consumption.  ? Eating small amounts of red meat and whole-fat dairy.  · If you have high blood pressure, you may need to limit your sodium intake or follow a diet such as the DASH eating plan. DASH is an eating plan that aims to lower high blood pressure.  What foods are recommended?  The items listed below may not be a complete list. Talk with your dietitian about what dietary choices are best for you.  Grains  Whole grains, such as whole-wheat or whole-grain breads, crackers, cereals, and pasta. Unsweetened oatmeal. Bulgur. Barley. Quinoa. Kroeker rice. Corn or whole-wheat flour tortillas or taco shells.  Vegetables  Lettuce. Spinach. Peas. Beets. Cauliflower. Cabbage. Broccoli. Carrots. Tomatoes. Squash. Eggplant. Herbs. Peppers. Onions. Cucumbers. Brussels sprouts.  Fruits  Berries. Bananas. Apples. Oranges. Grapes. Papaya. Mango. Pomegranate. Kiwi. Grapefruit. Cherries.  Meats and other protein foods  Seafood. Poultry without skin. Lean cuts of pork and beef. Tofu. Eggs. Nuts. Beans.  Dairy  Low-fat or fat-free dairy products, such as yogurt, cottage cheese, and cheese.  Beverages  Water.   Tea. Coffee. Sugar-free or diet soda. Seltzer water. Lowfat or no-fat milk. Milk alternatives, such as soy or almond milk.  Fats and oils  Olive oil. Canola oil. Sunflower oil. Grapeseed oil. Avocado. Walnuts.  Sweets and desserts  Sugar-free or low-fat pudding. Sugar-free or low-fat ice cream and other frozen treats.  Seasoning and other foods  Herbs. Sodium-free spices. Mustard. Relish. Low-fat, low-sugar ketchup. Low-fat, low-sugar barbecue sauce. Low-fat or fat-free mayonnaise.  What foods are not recommended?  The items listed below may not be a  complete list. Talk with your dietitian about what dietary choices are best for you.  Grains  Refined white flour and flour products, such as bread, pasta, snack foods, and cereals.  Vegetables  Canned vegetables. Frozen vegetables with butter or cream sauce.  Fruits  Fruits canned with syrup.  Meats and other protein foods  Fatty cuts of meat. Poultry with skin. Breaded or fried meat. Processed meats.  Dairy  Full-fat yogurt, cheese, or milk.  Beverages  Sweetened drinks, such as sweet iced tea and soda.  Fats and oils  Butter. Lard. Ghee.  Sweets and desserts  Baked goods, such as cake, cupcakes, pastries, cookies, and cheesecake.  Seasoning and other foods  Spice mixes with added salt. Ketchup. Barbecue sauce. Mayonnaise.  Summary  · To prevent diabetes from developing, you may need to make diet and other lifestyle changes to help control blood sugar, improve cholesterol levels, and manage your blood pressure.  · Set weight loss goals with the help of your health care team. It is recommended that most people with prediabetes lose 7 percent of their current body weight.  · Consider following a Mediterranean diet that includes plenty of fresh fruits and vegetables, whole grains, beans, nuts, seeds, fish, lean meat, low-fat dairy, and healthy oils.  This information is not intended to replace advice given to you by your health care provider. Make sure you discuss any questions you have with your health care provider.  Document Released: 07/19/2014 Document Revised: 05/08/2016 Document Reviewed: 05/08/2016  Elsevier Interactive Patient Education © 2019 [employer]

## 2018-07-13 NOTE — Assessment & Plan Note (Signed)
Discussed how weight loss could help improve reflux Will monitor

## 2018-07-13 NOTE — Assessment & Plan Note (Signed)
Will have him set up lab only appt for CMET, Lipid and A1C Encouraged him to consume a low carb diet and exercise for weight loss

## 2018-07-13 NOTE — Assessment & Plan Note (Signed)
Continue Losartan, HCTZ  Discussed the importance of weight loss, good BP control Will have him set up lab visit for CBC and CMET

## 2018-07-13 NOTE — Assessment & Plan Note (Signed)
Compensated Continue Losartan and HCTZ ? If we should add beta blocker to reduce heart workload and improve blood pressure Encouraged DASH diet, exercise for weight loss Encouraged daily weights Will have him set up lab only appt for CBC and CMET

## 2018-07-13 NOTE — Assessment & Plan Note (Addendum)
Reasonable control on Losartan and HCTZ Reinforced DASH diet and exercise for weight loss Will have him schedule lab only appt for CMET Will have him set up nurse visit for BP check

## 2018-07-13 NOTE — Progress Notes (Signed)
Patient ID: Brad Mcgee, male   DOB: 01-11-1986, 33 y.o.   MRN: [id number] Virtual Visit via Video Note  I connected with Brad Mcgee on 07/13/18 at  2:00 PM EDT by a video enabled telemedicine application and verified that I am speaking with the correct person using two identifiers.   I discussed the limitations of evaluation and management by telemedicine and the availability of in person appointments. The patient expressed understanding and agreed to proceed.  Patient Location: Home Provider Location: Office  History of Present Illness:  Pt due for follow up of chronic conditions.  CHF: He denies chronic cough, SOB or increase peripheral edema. He is taking Losartan and HCTZ as prescribed. Echo from 07/2015 reviewed.  HTN: His BP at home ranges 138/85. He is taking Losartan and HCTZ as prescribed. ECG from 07/2015 reviewed.  History of CVA with Left Hemiparesis: He feels like the weakness on his left side has worsened. He is having trouble gripping things with his left hand. He has been seeing podiatry because his left foot won't relax. He received a steroid shot today and was placed in a brace for 2 weeks. He is still trying to apply for disability. He is taking Losartan, HCTZ and ASA as prescribed. His last LDL was 119, triglycerides 182, 12/2017.   OSA: He reports he turned in the CPAP because he could not afford it. He feels rested when he wakes up. There is no sleep study on file.  Prediabetes: His last A1C was 6.2%, 01/2018. He does not monitor his sugars. He does not consume a low carb diet but has been working on weight loss.  Past Medical History:  Diagnosis Date  . CVA (cerebral vascular accident) ([facility])   . DVT (deep venous thrombosis) ([facility])   . Fracture of left ankle    treated with cast  . Hypertension     Current Outpatient Medications  Medication Sig Dispense Refill  . aspirin 81 MG tablet Take 81 mg by mouth daily.    . hydrochlorothiazide (HYDRODIURIL) 12.5 MG  tablet Take 1 tablet (12.5 mg total) by mouth daily. 180 tablet 0  . losartan (COZAAR) 50 MG tablet Take 1 tablet (50 mg total) by mouth daily. 90 tablet 1  . meloxicam (MOBIC) 15 MG tablet Take 1 tablet (15 mg total) by mouth daily as needed. 14 tablet 0   No current facility-administered medications for this visit.     Allergies  Allergen Reactions  . Bee Venom Anaphylaxis  . Coconut Oil Anaphylaxis  . Ancef [Cefazolin] Rash    Family History  Problem Relation Age of Onset  . Hypertension Mother   . Breast cancer Maternal Grandmother   . Hypertension Maternal Grandmother   . Prostate cancer Maternal Grandfather   . Hypertension Maternal Grandfather   . Hypertension Paternal Grandfather   . Diabetes Paternal Grandfather   . Multiple sclerosis Paternal Grandmother     Social History   Socioeconomic History  . Marital status: Single    Spouse name: Not on file  . Number of children: Not on file  . Years of education: Not on file  . Highest education level: Not on file  Occupational History  . Not on file  Social Needs  . Financial resource strain: Not on file  . Food insecurity:    Worry: Not on file    Inability: Not on file  . Transportation needs:    Medical: Not on file    Non-medical: Not on  file  Tobacco Use  . Smoking status: Never Smoker  . Smokeless tobacco: Never Used  Substance and Sexual Activity  . Alcohol use: No  . Drug use: No  . Sexual activity: Never  Lifestyle  . Physical activity:    Days per week: Not on file    Minutes per session: Not on file  . Stress: Not on file  Relationships  . Social connections:    Talks on phone: Not on file    Gets together: Not on file    Attends religious service: Not on file    Active member of club or organization: Not on file    Attends meetings of clubs or organizations: Not on file    Relationship status: Not on file  . Intimate partner violence:    Fear of current or ex partner: Not on file     Emotionally abused: Not on file    Physically abused: Not on file    Forced sexual activity: Not on file  Other Topics Concern  . Not on file  Social History Narrative  . Not on file     Constitutional: Denies fever, malaise, fatigue, headache or abrupt weight changes.  HEENT: Denies eye pain, eye redness, ear pain, ringing in the ears, wax buildup, runny nose, nasal congestion, bloody nose, or sore throat. Respiratory: Denies difficulty breathing, shortness of breath, cough or sputum production.   Cardiovascular: Denies chest pain, chest tightness, palpitations or swelling in the hands or feet.  Gastrointestinal: Denies abdominal pain, bloating, constipation, diarrhea or blood in the stool.  GU: Denies urgency, frequency, pain with urination, burning sensation, blood in urine, odor or discharge. Musculoskeletal: Pt reports weakness in left hand, tension in his left ankle/foot.Denies decrease in range of motion, difficulty with gait, muscle pain or joint pain and swelling.  Skin: Denies redness, rashes, lesions or ulcercations.  Neurological: Denies dizziness, difficulty with memory, difficulty with speech or problems with balance and coordination.  Psych: Denies anxiety, depression, SI/HI.  No other specific complaints in a complete review of systems (except as listed in HPI above).   Wt Readings from Last 3 Encounters:  07/04/18 275 lb (124.7 kg)  02/03/18 (!) 302 lb (137 kg)  01/12/18 (!) 307 lb (139.3 kg)    General: Appears his stated age, obese, in NAD. Skin: Warm, dry and intact. Pulmonary/Chest: Normal effort  No respiratory distress.  Musculoskeletal: Left ankle in brace. Neurological: Alert and oriented. Psychiatric: Mood and affect normal. Behavior is normal. Judgment and thought content normal.     BMET    Component Value Date/Time   NA 137 01/12/2018 0832   NA 140 12/30/2016 1001   K 3.9 01/12/2018 0832   CL 102 01/12/2018 0832   CO2 27 01/12/2018 0832    GLUCOSE 122 (H) 01/12/2018 0832   BUN 17 01/12/2018 0832   BUN 17 12/30/2016 1001   CREATININE 1.17 01/12/2018 0832   CALCIUM 9.6 01/12/2018 0832   GFRNONAA 86 12/30/2016 1001   GFRAA 99 12/30/2016 1001    Lipid Panel     Component Value Date/Time   CHOL 190 01/12/2018 0832   CHOL 166 12/30/2016 1001   TRIG 182.0 (H) 01/12/2018 0832   HDL 34.80 (L) 01/12/2018 0832   HDL 31 (L) 12/30/2016 1001   CHOLHDL 5 01/12/2018 0832   VLDL 36.4 01/12/2018 0832   LDLCALC 119 (H) 01/12/2018 0832   LDLCALC 113 (H) 12/30/2016 1001    CBC    Component Value Date/Time  WBC 5.7 01/12/2018 0832   RBC 4.67 01/12/2018 0832   HGB 15.7 01/12/2018 0832   HGB 15.4 12/30/2016 1001   HCT 44.7 01/12/2018 0832   HCT 44.2 12/30/2016 1001   PLT 231.0 01/12/2018 0832   PLT 216 12/30/2016 1001   MCV 95.8 01/12/2018 0832   MCV 93 12/30/2016 1001   MCH 32.5 12/30/2016 1001   MCH 32.0 01/26/2016 1225   MCHC 35.2 01/12/2018 0832   RDW 13.3 01/12/2018 0832   RDW 13.5 12/30/2016 1001   LYMPHSABS 1.9 09/28/2015 1434   MONOABS 0.4 09/28/2015 1434   EOSABS 0.1 09/28/2015 1434   BASOSABS 0.0 09/28/2015 1434    Hgb A1C Lab Results  Component Value Date   HGBA1C 6.2 01/12/2018         Assessment and Plan:  See problem based charting  Follow Up Instructions:    I discussed the assessment and treatment plan with the patient. The patient was provided an opportunity to ask questions and all were answered. The patient agreed with the plan and demonstrated an understanding of the instructions.   The patient was advised to call back or seek an in-person evaluation if the symptoms worsen or if the condition fails to improve as anticipated.     Nicki Reaperegina Baity, NP

## 2018-07-13 NOTE — Assessment & Plan Note (Signed)
Persistent Currently getting treatment for the LLE by podiatry Continue brace for now Follow up as originally planned Work note provided

## 2018-07-14 ENCOUNTER — Other Ambulatory Visit (INDEPENDENT_AMBULATORY_CARE_PROVIDER_SITE_OTHER): Payer: Self-pay

## 2018-07-14 DIAGNOSIS — G8194 Hemiplegia, unspecified affecting left nondominant side: Secondary | ICD-10-CM

## 2018-07-14 DIAGNOSIS — I10 Essential (primary) hypertension: Secondary | ICD-10-CM

## 2018-07-14 DIAGNOSIS — Z8673 Personal history of transient ischemic attack (TIA), and cerebral infarction without residual deficits: Secondary | ICD-10-CM

## 2018-07-14 DIAGNOSIS — I5032 Chronic diastolic (congestive) heart failure: Secondary | ICD-10-CM

## 2018-07-14 DIAGNOSIS — G4733 Obstructive sleep apnea (adult) (pediatric): Secondary | ICD-10-CM

## 2018-07-14 LAB — LIPID PANEL
Cholesterol: 185 mg/dL (ref 0–200)
HDL: 39.7 mg/dL (ref >39.00)
LDL Cholesterol: 131 mg/dL — ABNORMAL HIGH (ref 0–99)
NonHDL: 145.6
Total CHOL/HDL Ratio: 5
Triglycerides: 74 mg/dL (ref 0.0–149.0)
VLDL: 14.8 mg/dL (ref 0.0–40.0)

## 2018-07-14 LAB — COMPREHENSIVE METABOLIC PANEL
ALT: 39 U/L (ref 0–53)
AST: 29 U/L (ref 0–37)
Albumin: 4.4 g/dL (ref 3.5–5.2)
Alkaline Phosphatase: 51 U/L (ref 39–117)
BUN: 15 mg/dL (ref 6–23)
CO2: 26 mEq/L (ref 19–32)
Calcium: 9.6 mg/dL (ref 8.4–10.5)
Chloride: 102 mEq/L (ref 96–112)
Creatinine, Ser: 1.05 mg/dL (ref 0.40–1.50)
GFR: 97.84 mL/min (ref >60.00)
Glucose, Bld: 113 mg/dL — ABNORMAL HIGH (ref 70–99)
Potassium: 3.8 mEq/L (ref 3.5–5.1)
Sodium: 136 mEq/L (ref 135–145)
Total Bilirubin: 0.5 mg/dL (ref 0.2–1.2)
Total Protein: 7.5 g/dL (ref 6.0–8.3)

## 2018-07-14 LAB — CBC
HCT: 44.7 % (ref 39.0–52.0)
Hemoglobin: 15.5 g/dL (ref 13.0–17.0)
MCHC: 34.7 g/dL (ref 30.0–36.0)
MCV: 95.2 fl (ref 78.0–100.0)
Platelets: 233 10*3/uL (ref 150.0–400.0)
RBC: 4.69 Mil/uL (ref 4.22–5.81)
RDW: 13.1 % (ref 11.5–15.5)
WBC: 10.2 10*3/uL (ref 4.0–10.5)

## 2018-07-14 LAB — HEMOGLOBIN A1C: Hgb A1c MFr Bld: 6.1 % (ref 4.6–6.5)

## 2018-07-21 ENCOUNTER — Encounter: Payer: Self-pay | Admitting: Internal Medicine

## 2018-08-20 ENCOUNTER — Encounter: Payer: Self-pay | Admitting: Internal Medicine

## 2018-08-24 ENCOUNTER — Encounter: Payer: Self-pay | Admitting: Internal Medicine

## 2018-08-24 MED ORDER — LOSARTAN POTASSIUM 50 MG PO TABS: 50.0000 mg | ORAL_TABLET | Freq: Every day | ORAL | 1 refills | Status: DC

## 2018-09-30 ENCOUNTER — Encounter: Payer: Self-pay | Admitting: Physical Medicine & Rehabilitation

## 2019-01-14 ENCOUNTER — Encounter: Payer: BLUE CROSS/BLUE SHIELD | Admitting: Internal Medicine

## 2019-03-31 DIAGNOSIS — R509 Fever, unspecified: Secondary | ICD-10-CM | POA: Diagnosis not present

## 2019-03-31 DIAGNOSIS — R5383 Other fatigue: Secondary | ICD-10-CM | POA: Diagnosis not present

## 2019-03-31 DIAGNOSIS — M791 Myalgia, unspecified site: Secondary | ICD-10-CM | POA: Diagnosis not present

## 2019-03-31 DIAGNOSIS — Z20828 Contact with and (suspected) exposure to other viral communicable diseases: Secondary | ICD-10-CM | POA: Diagnosis not present

## 2019-04-01 ENCOUNTER — Encounter: Payer: Self-pay | Admitting: Internal Medicine

## 2019-05-13 ENCOUNTER — Ambulatory Visit (INDEPENDENT_AMBULATORY_CARE_PROVIDER_SITE_OTHER): Payer: BLUE CROSS/BLUE SHIELD | Admitting: Internal Medicine

## 2019-05-13 ENCOUNTER — Other Ambulatory Visit: Payer: Self-pay

## 2019-05-13 VITALS — BP 142/94 | HR 86 | Temp 97.9°F | Ht 67.5 in | Wt 291.0 lb

## 2019-05-13 DIAGNOSIS — G4733 Obstructive sleep apnea (adult) (pediatric): Secondary | ICD-10-CM | POA: Diagnosis not present

## 2019-05-13 DIAGNOSIS — I610 Nontraumatic intracerebral hemorrhage in hemisphere, subcortical: Secondary | ICD-10-CM

## 2019-05-13 DIAGNOSIS — R7303 Prediabetes: Secondary | ICD-10-CM | POA: Diagnosis not present

## 2019-05-13 DIAGNOSIS — I5032 Chronic diastolic (congestive) heart failure: Secondary | ICD-10-CM | POA: Diagnosis not present

## 2019-05-13 DIAGNOSIS — G8194 Hemiplegia, unspecified affecting left nondominant side: Secondary | ICD-10-CM

## 2019-05-13 DIAGNOSIS — Z0000 Encounter for general adult medical examination without abnormal findings: Secondary | ICD-10-CM | POA: Diagnosis not present

## 2019-05-13 DIAGNOSIS — I10 Essential (primary) hypertension: Secondary | ICD-10-CM | POA: Diagnosis not present

## 2019-05-13 MED ORDER — LOSARTAN POTASSIUM 50 MG PO TABS: 50.0000 mg | ORAL_TABLET | Freq: Every day | ORAL | 2 refills | Status: DC

## 2019-05-13 MED ORDER — HYDROCHLOROTHIAZIDE 12.5 MG PO TABS: 12.5000 mg | ORAL_TABLET | Freq: Every day | ORAL | 0 refills | Status: DC

## 2019-05-13 NOTE — Progress Notes (Signed)
Subjective:    Patient ID: Brad Mcgee, male    DOB: 06/23/1985, 34 y.o.   MRN: [id number]  HPI  Pt presents to the clinic today for his annual exam. He is also due to follow up chronic conditions.  CHF: He denies chronic cough, SOB or lower extremity edema. He is taking Losartan and HCTZ as prescribed. Echo from 07/2015 reviewed.  HTN: His BP today is 144/96. His BP at home runs in the 130's/80's. He is taking Losartan and HCTZ as prescribed. ECG from 07/2015 reviewed.   History of CVA with Left Hemiparesis: He reports persistent left sided weakness. His last LDL was 131, 06/2018. He is not taking any cholesterol lowering medication at this time. He is taking Losartan, HCTZ and ASA as prescribed.   OSA: He sleeps well without the use of his CPAP. Sleep study from 10/2015 reviewed.  Prediabetes. His last A1C was 6.1%, 06/2018. He is not on any oral diabetic medication at this time. He does not check his sugars.  Flu: never Tetanus: 01/2017 Dentist: as needed  Diet: He does eat meat. He consumes more veggies than fruits. He rarely eats fried foods. He drinks mostly water. Exercise: None  Review of Systems      Past Medical History:  Diagnosis Date  . CVA (cerebral vascular accident) ([place])   . DVT (deep venous thrombosis) ([place])   . Fracture of left ankle    treated with cast  . Hypertension     Current Outpatient Medications  Medication Sig Dispense Refill  . aspirin 81 MG tablet Take 81 mg by mouth daily.    . hydrochlorothiazide (HYDRODIURIL) 12.5 MG tablet Take 1 tablet (12.5 mg total) by mouth daily. 180 tablet 0  . losartan (COZAAR) 50 MG tablet Take 1 tablet (50 mg total) by mouth daily. 90 tablet 1  . meloxicam (MOBIC) 15 MG tablet Take 1 tablet (15 mg total) by mouth daily as needed. 14 tablet 0   No current facility-administered medications for this visit.    Allergies  Allergen Reactions  . Bee Venom Anaphylaxis  . Coconut Oil Anaphylaxis  . Ancef  [Cefazolin] Rash    Family History  Problem Relation Age of Onset  . Hypertension Mother   . Breast cancer Maternal Grandmother   . Hypertension Maternal Grandmother   . Prostate cancer Maternal Grandfather   . Hypertension Maternal Grandfather   . Hypertension Paternal Grandfather   . Diabetes Paternal Grandfather   . Multiple sclerosis Paternal Grandmother     Social History   Socioeconomic History  . Marital status: Single    Spouse name: Not on file  . Number of children: Not on file  . Years of education: Not on file  . Highest education level: Not on file  Occupational History  . Not on file  Tobacco Use  . Smoking status: Never Smoker  . Smokeless tobacco: Never Used  Substance and Sexual Activity  . Alcohol use: No  . Drug use: No  . Sexual activity: Never  Other Topics Concern  . Not on file  Social History Narrative  . Not on file   Social Determinants of Health   Financial Resource Strain:   . Difficulty of Paying Living Expenses: Not on file  Food Insecurity:   . Worried About Charity fundraiser in the Last Year: Not on file  . Ran Out of Food in the Last Year: Not on file  Transportation Needs:   . Lack of  Transportation (Medical): Not on file  . Lack of Transportation (Non-Medical): Not on file  Physical Activity:   . Days of Exercise per Week: Not on file  . Minutes of Exercise per Session: Not on file  Stress:   . Feeling of Stress : Not on file  Social Connections:   . Frequency of Communication with Friends and Family: Not on file  . Frequency of Social Gatherings with Friends and Family: Not on file  . Attends Religious Services: Not on file  . Active Member of Clubs or Organizations: Not on file  . Attends Banker Meetings: Not on file  . Marital Status: Not on file  Intimate Partner Violence:   . Fear of Current or Ex-Partner: Not on file  . Emotionally Abused: Not on file  . Physically Abused: Not on file  . Sexually  Abused: Not on file     Constitutional: Denies fever, malaise, fatigue, headache or abrupt weight changes.  HEENT: Denies eye pain, eye redness, ear pain, ringing in the ears, wax buildup, runny nose, nasal congestion, bloody nose, or sore throat. Respiratory: Denies difficulty breathing, shortness of breath, cough or sputum production.   Cardiovascular: Denies chest pain, chest tightness, palpitations or swelling in the hands or feet.  Gastrointestinal: Denies abdominal pain, bloating, constipation, diarrhea or blood in the stool.  GU: Denies urgency, frequency, pain with urination, burning sensation, blood in urine, odor or discharge. Musculoskeletal: Denies decrease in range of motion, difficulty with gait, muscle pain or joint pain and swelling.  Skin: Denies redness, rashes, lesions or ulcercations.  Neurological: Denies dizziness, difficulty with memory, difficulty with speech or problems with balance and coordination.  Psych: Denies anxiety, depression, SI/HI.  No other specific complaints in a complete review of systems (except as listed in HPI above).  Objective:   Physical Exam BP (!) 142/94   Pulse 86   Temp 97.9 F (36.6 C) (Temporal)   Ht 5' 7.5" (1.715 m)   Wt 291 lb (132 kg)   SpO2 97%   BMI 44.90 kg/m   Wt Readings from Last 3 Encounters:  07/04/18 275 lb (124.7 kg)  02/03/18 (!) 302 lb (137 kg)  01/12/18 (!) 307 lb (139.3 kg)    General: Appears his stated age, obese, in NAD. Skin: Warm, dry and intact. No ulcerations noted. HEENT: Head: normal shape and size; Eyes: sclera white, no icterus, conjunctiva pink, PERRLA and EOMs intact;  Neck:  Neck supple, trachea midline. No masses, lumps or thyromegaly present.  Cardiovascular: Normal rate and rhythm. S1,S2 noted.  No murmur, rubs or gallops noted. No JVD or BLE edema.  Pulmonary/Chest: Normal effort and positive vesicular breath sounds. No respiratory distress. No wheezes, rales or ronchi noted.  Abdomen: No  distention or masses noted. Liver, spleen and kidneys non palpable. Musculoskeletal: Strength 5/5 BUE/BLE No difficulty with gait.  Neurological: Alert and oriented. Cranial nerves II-XII grossly intact. Coordination normal.  Psychiatric: Mood and affect normal. Behavior is normal. Judgment and thought content normal.     BMET    Component Value Date/Time   NA 136 07/14/2018 0947   NA 140 12/30/2016 1001   K 3.8 07/14/2018 0947   CL 102 07/14/2018 0947   CO2 26 07/14/2018 0947   GLUCOSE 113 (H) 07/14/2018 0947   BUN 15 07/14/2018 0947   BUN 17 12/30/2016 1001   CREATININE 1.05 07/14/2018 0947   CALCIUM 9.6 07/14/2018 0947   GFRNONAA 86 12/30/2016 1001   GFRAA 99 12/30/2016  1001    Lipid Panel     Component Value Date/Time   CHOL 185 07/14/2018 0947   CHOL 166 12/30/2016 1001   TRIG 74.0 07/14/2018 0947   HDL 39.70 07/14/2018 0947   HDL 31 (L) 12/30/2016 1001   CHOLHDL 5 07/14/2018 0947   VLDL 14.8 07/14/2018 0947   LDLCALC 131 (H) 07/14/2018 0947   LDLCALC 113 (H) 12/30/2016 1001    CBC    Component Value Date/Time   WBC 10.2 07/14/2018 0947   RBC 4.69 07/14/2018 0947   HGB 15.5 07/14/2018 0947   HGB 15.4 12/30/2016 1001   HCT 44.7 07/14/2018 0947   HCT 44.2 12/30/2016 1001   PLT 233.0 07/14/2018 0947   PLT 216 12/30/2016 1001   MCV 95.2 07/14/2018 0947   MCV 93 12/30/2016 1001   MCH 32.5 12/30/2016 1001   MCH 32.0 01/26/2016 1225   MCHC 34.7 07/14/2018 0947   RDW 13.1 07/14/2018 0947   RDW 13.5 12/30/2016 1001   LYMPHSABS 1.9 09/28/2015 1434   MONOABS 0.4 09/28/2015 1434   EOSABS 0.1 09/28/2015 1434   BASOSABS 0.0 09/28/2015 1434    Hgb A1C Lab Results  Component Value Date   HGBA1C 6.1 07/14/2018            Assessment & Plan:   Preventtative Health Maintenance:  Flu shot declined Tetanus UTD Encouraged him to consume a balanced diet and exercise regimen. Advised him to see a dentist annually Will check CBC, CMET, Lipid, A1C  today  RTC in 6 months, follow up chronic conditions Nicki Reaper, NP This visit occurred during the SARS-CoV-2 public health emergency.  Safety protocols were in place, including screening questions prior to the visit, additional usage of staff PPE, and extensive cleaning of exam room while observing appropriate contact time as indicated for disinfecting solutions.

## 2019-05-14 ENCOUNTER — Encounter: Payer: Self-pay | Admitting: Internal Medicine

## 2019-05-14 LAB — CBC
HCT: 47.6 % (ref 39.0–52.0)
Hemoglobin: 16.3 g/dL (ref 13.0–17.0)
MCHC: 34.3 g/dL (ref 30.0–36.0)
MCV: 95.6 fl (ref 78.0–100.0)
Platelets: 280 10*3/uL (ref 150.0–400.0)
RBC: 4.98 Mil/uL (ref 4.22–5.81)
RDW: 13.5 % (ref 11.5–15.5)
WBC: 6.7 10*3/uL (ref 4.0–10.5)

## 2019-05-14 LAB — HEMOGLOBIN A1C
Hgb A1c MFr Bld: 6 % of total Hgb — ABNORMAL HIGH (ref <5.7)
Mean Plasma Glucose: 126 (calc)
eAG (mmol/L): 7 (calc)

## 2019-05-14 LAB — COMPREHENSIVE METABOLIC PANEL
ALT: 26 U/L (ref 0–53)
AST: 25 U/L (ref 0–37)
Albumin: 4.4 g/dL (ref 3.5–5.2)
Alkaline Phosphatase: 61 U/L (ref 39–117)
BUN: 15 mg/dL (ref 6–23)
CO2: 27 mEq/L (ref 19–32)
Calcium: 9.9 mg/dL (ref 8.4–10.5)
Chloride: 100 mEq/L (ref 96–112)
Creatinine, Ser: 1.06 mg/dL (ref 0.40–1.50)
GFR: 96.3 mL/min (ref >60.00)
Glucose, Bld: 78 mg/dL (ref 70–99)
Potassium: 4 mEq/L (ref 3.5–5.1)
Sodium: 135 mEq/L (ref 135–145)
Total Bilirubin: 0.6 mg/dL (ref 0.2–1.2)
Total Protein: 8.1 g/dL (ref 6.0–8.3)

## 2019-05-14 LAB — LIPID PANEL
Cholesterol: 191 mg/dL (ref 0–200)
HDL: 34.1 mg/dL — ABNORMAL LOW (ref >39.00)
LDL Cholesterol: 133 mg/dL — ABNORMAL HIGH (ref 0–99)
NonHDL: 156.99
Total CHOL/HDL Ratio: 6
Triglycerides: 121 mg/dL (ref 0.0–149.0)
VLDL: 24.2 mg/dL (ref 0.0–40.0)

## 2019-05-14 NOTE — Assessment & Plan Note (Signed)
Compensated Continue Losartan HCTZ CMET today Reinforced DASH diet, exercise for weight loss, monitor weights

## 2019-05-14 NOTE — Assessment & Plan Note (Signed)
Not wearing CPAP Will monitor 

## 2019-05-14 NOTE — Assessment & Plan Note (Signed)
With left hemiparesis Continue Losartan and HCTZ

## 2019-05-14 NOTE — Assessment & Plan Note (Signed)
A1C today Encouraged low carb diet and exercise for weight loss 

## 2019-05-14 NOTE — Assessment & Plan Note (Signed)
Stable.       - Continue to monitor

## 2019-05-14 NOTE — Assessment & Plan Note (Signed)
Discussed adding additional medication but he declines at this time Losartan nand HCTZ refilled today CMET today

## 2019-05-14 NOTE — Patient Instructions (Signed)

## 2019-07-29 ENCOUNTER — Encounter: Payer: Self-pay | Admitting: Internal Medicine

## 2019-07-29 ENCOUNTER — Ambulatory Visit: Payer: 59 | Admitting: Internal Medicine

## 2019-07-29 ENCOUNTER — Other Ambulatory Visit: Payer: Self-pay

## 2019-07-29 VITALS — BP 144/98 | HR 79 | Temp 97.6°F | Ht 67.5 in | Wt 302.8 lb

## 2019-07-29 DIAGNOSIS — S40862A Insect bite (nonvenomous) of left upper arm, initial encounter: Secondary | ICD-10-CM

## 2019-07-29 DIAGNOSIS — W57XXXA Bitten or stung by nonvenomous insect and other nonvenomous arthropods, initial encounter: Secondary | ICD-10-CM

## 2019-07-29 MED ORDER — DOXYCYCLINE HYCLATE 100 MG PO TABS: 100.0000 mg | ORAL_TABLET | Freq: Two times a day (BID) | ORAL | 0 refills | Status: DC

## 2019-07-29 NOTE — Telephone Encounter (Signed)
Patient has an appointment today for evaluation for this.

## 2019-07-29 NOTE — Patient Instructions (Signed)

## 2019-07-29 NOTE — Progress Notes (Signed)
Subjective:    Patient ID: Brad Mcgee, male    DOB: 1985/11/25, 34 y.o.   MRN: [id number]  HPI  Pt presents to the clinic today with c/o tick bite under his left axilla.  He noticed this 4 days ago after pulling a tick off of him.  He reports the tick was not engorged.  He was able to get the entire head out.  Believes the tick was on him less than 24 hours.  Since that time, he has noticed increased swelling, redness and pain of the area.  He denies fever, chills or body aches.  He has not tried anything over-the-counter for this.  Review of Systems      Past Medical History:  Diagnosis Date  . CVA (cerebral vascular accident) ([place])   . DVT (deep venous thrombosis) ([place])   . Fracture of left ankle    treated with cast  . Hypertension     Current Outpatient Medications  Medication Sig Dispense Refill  . aspirin 81 MG tablet Take 81 mg by mouth daily.    . hydrochlorothiazide (HYDRODIURIL) 12.5 MG tablet Take 1 tablet (12.5 mg total) by mouth daily. 180 tablet 0  . losartan (COZAAR) 50 MG tablet Take 1 tablet (50 mg total) by mouth daily. 90 tablet 2   No current facility-administered medications for this visit.    Allergies  Allergen Reactions  . Bee Venom Anaphylaxis  . Coconut Oil Anaphylaxis  . Ancef [Cefazolin] Rash    Family History  Problem Relation Age of Onset  . Hypertension Mother   . Breast cancer Maternal Grandmother   . Hypertension Maternal Grandmother   . Prostate cancer Maternal Grandfather   . Hypertension Maternal Grandfather   . Hypertension Paternal Grandfather   . Diabetes Paternal Grandfather   . Multiple sclerosis Paternal Grandmother     Social History   Socioeconomic History  . Marital status: Married    Spouse name: Not on file  . Number of children: Not on file  . Years of education: Not on file  . Highest education level: Not on file  Occupational History  . Not on file  Tobacco Use  . Smoking status: Never Smoker  .  Smokeless tobacco: Never Used  Substance and Sexual Activity  . Alcohol use: No  . Drug use: No  . Sexual activity: Never  Other Topics Concern  . Not on file  Social History Narrative  . Not on file   Social Determinants of Health   Financial Resource Strain:   . Difficulty of Paying Living Expenses:   Food Insecurity:   . Worried About Charity fundraiser in the Last Year:   . Arboriculturist in the Last Year:   Transportation Needs:   . Film/video editor (Medical):   Marland Kitchen Lack of Transportation (Non-Medical):   Physical Activity:   . Days of Exercise per Week:   . Minutes of Exercise per Session:   Stress:   . Feeling of Stress :   Social Connections:   . Frequency of Communication with Friends and Family:   . Frequency of Social Gatherings with Friends and Family:   . Attends Religious Services:   . Active Member of Clubs or Organizations:   . Attends Archivist Meetings:   Marland Kitchen Marital Status:   Intimate Partner Violence:   . Fear of Current or Ex-Partner:   . Emotionally Abused:   Marland Kitchen Physically Abused:   . Sexually Abused:  Constitutional: Denies fever, malaise, fatigue, headache or abrupt weight changes.  Respiratory: Denies difficulty breathing, shortness of breath, cough or sputum production.   Cardiovascular: Denies chest pain, chest tightness, palpitations or swelling in the hands or feet.  Skin: Patient reports tick bite of left axilla.  Denies ulcercations.  Neurological: Denies dizziness, difficulty with memory, difficulty with speech or problems with balance and coordination.    No other specific complaints in a complete review of systems (except as listed in HPI above).  Objective:   Physical Exam  BP (!) 144/98 (BP Location: Right Arm, Patient Position: Sitting, Cuff Size: Large)   Pulse 79   Temp 97.6 F (36.4 C) (Other (Comment))   Ht 5' 7.5" (1.715 m)   Wt (!) 302 lb 12 oz (137.3 kg)   SpO2 98%   BMI 46.72 kg/m   Wt  Readings from Last 3 Encounters:  05/13/19 291 lb (132 kg)  07/04/18 275 lb (124.7 kg)  02/03/18 (!) 302 lb (137 kg)    General: Appears his stated age, obese, in NAD. Skin: 3 inch x 2 inch area of redness, swelling and pain with palpation inferior to the left axilla.  Scab noted in the middle of the area of induration.  No drainage noted. Cardiovascular: Normal rate and rhythm.  Pulmonary/Chest: Normal effort. Neurological: Alert and oriented.    BMET    Component Value Date/Time   NA 135 05/13/2019 1625   NA 140 12/30/2016 1001   K 4.0 05/13/2019 1625   CL 100 05/13/2019 1625   CO2 27 05/13/2019 1625   GLUCOSE 78 05/13/2019 1625   BUN 15 05/13/2019 1625   BUN 17 12/30/2016 1001   CREATININE 1.06 05/13/2019 1625   CALCIUM 9.9 05/13/2019 1625   GFRNONAA 86 12/30/2016 1001   GFRAA 99 12/30/2016 1001    Lipid Panel     Component Value Date/Time   CHOL 191 05/13/2019 1625   CHOL 166 12/30/2016 1001   TRIG 121.0 05/13/2019 1625   HDL 34.10 (L) 05/13/2019 1625   HDL 31 (L) 12/30/2016 1001   CHOLHDL 6 05/13/2019 1625   VLDL 24.2 05/13/2019 1625   LDLCALC 133 (H) 05/13/2019 1625   LDLCALC 113 (H) 12/30/2016 1001    CBC    Component Value Date/Time   WBC 6.7 05/13/2019 1625   RBC 4.98 05/13/2019 1625   HGB 16.3 05/13/2019 1625   HGB 15.4 12/30/2016 1001   HCT 47.6 05/13/2019 1625   HCT 44.2 12/30/2016 1001   PLT 280.0 05/13/2019 1625   PLT 216 12/30/2016 1001   MCV 95.6 05/13/2019 1625   MCV 93 12/30/2016 1001   MCH 32.5 12/30/2016 1001   MCH 32.0 01/26/2016 1225   MCHC 34.3 05/13/2019 1625   RDW 13.5 05/13/2019 1625   RDW 13.5 12/30/2016 1001   LYMPHSABS 1.9 09/28/2015 1434   MONOABS 0.4 09/28/2015 1434   EOSABS 0.1 09/28/2015 1434   BASOSABS 0.0 09/28/2015 1434    Hgb A1C Lab Results  Component Value Date   HGBA1C 6.0 (H) 05/13/2019           Assessment & Plan:  Infected Tick Bite:  Rx for Doxycycline 100 mg p.o. twice daily x10 days Warm  compresses as needed for comfort  Return precautions discussed Nicki Reaper, NP This visit occurred during the SARS-CoV-2 public health emergency.  Safety protocols were in place, including screening questions prior to the visit, additional usage of staff PPE, and extensive cleaning of exam room while observing  appropriate contact time as indicated for disinfecting solutions.

## 2019-12-03 DIAGNOSIS — M76822 Posterior tibial tendinitis, left leg: Secondary | ICD-10-CM | POA: Diagnosis not present

## 2019-12-03 DIAGNOSIS — M7672 Peroneal tendinitis, left leg: Secondary | ICD-10-CM | POA: Diagnosis not present

## 2019-12-03 DIAGNOSIS — M19072 Primary osteoarthritis, left ankle and foot: Secondary | ICD-10-CM | POA: Diagnosis not present

## 2019-12-03 DIAGNOSIS — M2141 Flat foot [pes planus] (acquired), right foot: Secondary | ICD-10-CM | POA: Diagnosis not present

## 2019-12-03 DIAGNOSIS — M79672 Pain in left foot: Secondary | ICD-10-CM | POA: Diagnosis not present

## 2019-12-03 DIAGNOSIS — G8929 Other chronic pain: Secondary | ICD-10-CM | POA: Diagnosis not present

## 2019-12-03 DIAGNOSIS — M25572 Pain in left ankle and joints of left foot: Secondary | ICD-10-CM | POA: Diagnosis not present

## 2019-12-26 DIAGNOSIS — G4733 Obstructive sleep apnea (adult) (pediatric): Secondary | ICD-10-CM | POA: Diagnosis not present

## 2019-12-26 DIAGNOSIS — I69354 Hemiplegia and hemiparesis following cerebral infarction affecting left non-dominant side: Secondary | ICD-10-CM | POA: Diagnosis not present

## 2019-12-26 DIAGNOSIS — I5032 Chronic diastolic (congestive) heart failure: Secondary | ICD-10-CM | POA: Diagnosis not present

## 2019-12-26 DIAGNOSIS — I10 Essential (primary) hypertension: Secondary | ICD-10-CM | POA: Diagnosis not present

## 2019-12-26 DIAGNOSIS — I110 Hypertensive heart disease with heart failure: Secondary | ICD-10-CM | POA: Diagnosis not present

## 2019-12-26 DIAGNOSIS — Z79899 Other long term (current) drug therapy: Secondary | ICD-10-CM | POA: Diagnosis not present

## 2019-12-27 ENCOUNTER — Encounter: Payer: Self-pay | Admitting: Internal Medicine

## 2019-12-31 DIAGNOSIS — M19072 Primary osteoarthritis, left ankle and foot: Secondary | ICD-10-CM | POA: Diagnosis not present

## 2019-12-31 DIAGNOSIS — M25572 Pain in left ankle and joints of left foot: Secondary | ICD-10-CM | POA: Diagnosis not present

## 2019-12-31 DIAGNOSIS — M2141 Flat foot [pes planus] (acquired), right foot: Secondary | ICD-10-CM | POA: Diagnosis not present

## 2019-12-31 DIAGNOSIS — M76822 Posterior tibial tendinitis, left leg: Secondary | ICD-10-CM | POA: Diagnosis not present

## 2019-12-31 DIAGNOSIS — M7672 Peroneal tendinitis, left leg: Secondary | ICD-10-CM | POA: Diagnosis not present

## 2020-01-03 ENCOUNTER — Other Ambulatory Visit: Payer: Self-pay

## 2020-01-03 ENCOUNTER — Encounter: Payer: Self-pay | Admitting: Internal Medicine

## 2020-01-03 ENCOUNTER — Ambulatory Visit (INDEPENDENT_AMBULATORY_CARE_PROVIDER_SITE_OTHER): Payer: BC Managed Care – PPO | Admitting: Internal Medicine

## 2020-01-03 VITALS — BP 164/108 | HR 86 | Temp 98.1°F

## 2020-01-03 DIAGNOSIS — I5032 Chronic diastolic (congestive) heart failure: Secondary | ICD-10-CM | POA: Diagnosis not present

## 2020-01-03 DIAGNOSIS — I610 Nontraumatic intracerebral hemorrhage in hemisphere, subcortical: Secondary | ICD-10-CM | POA: Diagnosis not present

## 2020-01-03 DIAGNOSIS — I160 Hypertensive urgency: Secondary | ICD-10-CM

## 2020-01-03 NOTE — Progress Notes (Signed)
Subjective:    Patient ID: Brad Mcgee, male    DOB: [birth year], 34 y.o.   MRN: [id number]  HPI  Pt presents to the clinic today for ER follow up. He went to the ER 10/10 with c/o elevated blood pressure. He had been taking Losartan and HCT as prescribed. He has a history of left side hemiparesis s/p stroke and CHF. His BP was in the 170-180's/110. Labs were unremarkable. He was advises if SBP > 180 to double up on either Losartan or HCT but not both. He was discharged the same day, advised to follow up with cardiology/PCP. Since discharge, he reports he has been checking his BP at home, running 140/90. He feels like this is related to increase in dose of Aspirin which he has decreased. He has not doubled up on either of his blood pressure medications. Of note, he is waiting to have surgery on his left ankle. He is currently wearing a boot and reports he is in significant pain from this. He is following with podiatry.  Review of Systems      Past Medical History:  Diagnosis Date  . CVA (cerebral vascular accident) (HCC)   . DVT (deep venous thrombosis) (HCC)   . Fracture of left ankle    treated with cast  . Hypertension     Current Outpatient Medications  Medication Sig Dispense Refill  . aspirin 81 MG tablet Take 81 mg by mouth daily.    Marland Kitchen doxycycline (VIBRA-TABS) 100 MG tablet Take 1 tablet (100 mg total) by mouth 2 (two) times daily. 20 tablet 0  . hydrochlorothiazide (HYDRODIURIL) 12.5 MG tablet Take 1 tablet (12.5 mg total) by mouth daily. 180 tablet 0  . losartan (COZAAR) 50 MG tablet Take 1 tablet (50 mg total) by mouth daily. 90 tablet 2   No current facility-administered medications for this visit.    Allergies  Allergen Reactions  . Bee Venom Anaphylaxis  . Coconut Oil Anaphylaxis  . Ancef [Cefazolin] Rash    Family History  Problem Relation Age of Onset  . Hypertension Mother   . Breast cancer Maternal Grandmother   . Hypertension Maternal Grandmother   .  Prostate cancer Maternal Grandfather   . Hypertension Maternal Grandfather   . Hypertension Paternal Grandfather   . Diabetes Paternal Grandfather   . Multiple sclerosis Paternal Grandmother     Social History   Socioeconomic History  . Marital status: Married    Spouse name: Not on file  . Number of children: Not on file  . Years of education: Not on file  . Highest education level: Not on file  Occupational History  . Not on file  Tobacco Use  . Smoking status: Never Smoker  . Smokeless tobacco: Never Used  Substance and Sexual Activity  . Alcohol use: No  . Drug use: No  . Sexual activity: Never  Other Topics Concern  . Not on file  Social History Narrative  . Not on file   Social Determinants of Health   Financial Resource Strain:   . Difficulty of Paying Living Expenses: Not on file  Food Insecurity:   . Worried About Programme researcher, broadcasting/film/video in the Last Year: Not on file  . Ran Out of Food in the Last Year: Not on file  Transportation Needs:   . Lack of Transportation (Medical): Not on file  . Lack of Transportation (Non-Medical): Not on file  Physical Activity:   . Days of Exercise per Week: Not  on file  . Minutes of Exercise per Session: Not on file  Stress:   . Feeling of Stress : Not on file  Social Connections:   . Frequency of Communication with Friends and Family: Not on file  . Frequency of Social Gatherings with Friends and Family: Not on file  . Attends Religious Services: Not on file  . Active Member of Clubs or Organizations: Not on file  . Attends Banker Meetings: Not on file  . Marital Status: Not on file  Intimate Partner Violence:   . Fear of Current or Ex-Partner: Not on file  . Emotionally Abused: Not on file  . Physically Abused: Not on file  . Sexually Abused: Not on file     Constitutional: Denies fever, malaise, fatigue, headache or abrupt weight changes.  HEENT: Denies eye pain, eye redness, ear pain, ringing in the  ears, wax buildup, runny nose, nasal congestion, bloody nose, or sore throat. Respiratory: Denies difficulty breathing, shortness of breath, cough or sputum production.   Cardiovascular: Denies chest pain, chest tightness, palpitations or swelling in the hands or feet.  Neurological: Denies dizziness, difficulty with memory, difficulty with speech or problems with balance and coordination.   No other specific complaints in a complete review of systems (except as listed in HPI above).  Objective:   Physical Exam   BP (!) 164/108   Pulse 86   Temp 98.1 F (36.7 C) (Temporal)   SpO2 97%   Wt Readings from Last 3 Encounters:  07/29/19 (!) 302 lb 12 oz (137.3 kg)  05/13/19 291 lb (132 kg)  07/04/18 275 lb (124.7 kg)    General: Appears his stated age, obese, in NAD. HEENT: Head: normal shape and size; Eyes: sclera white, no icterus, conjunctiva pink, PERRLA and EOMs intact;  Cardiovascular: Normal rate and rhythm. S1,S2 noted.  No murmur, rubs or gallops noted. No JVD or BLE edema. No carotid bruits noted. Pulmonary/Chest: Normal effort and positive vesicular breath sounds. No respiratory distress. No wheezes, rales or ronchi noted.  Musculoskeletal: Boot to LLE. Gait slow and steady without device. Neurological: Alert and oriented.   BMET    Component Value Date/Time   NA 135 05/13/2019 1625   NA 140 12/30/2016 1001   K 4.0 05/13/2019 1625   CL 100 05/13/2019 1625   CO2 27 05/13/2019 1625   GLUCOSE 78 05/13/2019 1625   BUN 15 05/13/2019 1625   BUN 17 12/30/2016 1001   CREATININE 1.06 05/13/2019 1625   CALCIUM 9.9 05/13/2019 1625   GFRNONAA 86 12/30/2016 1001   GFRAA 99 12/30/2016 1001    Lipid Panel     Component Value Date/Time   CHOL 191 05/13/2019 1625   CHOL 166 12/30/2016 1001   TRIG 121.0 05/13/2019 1625   HDL 34.10 (L) 05/13/2019 1625   HDL 31 (L) 12/30/2016 1001   CHOLHDL 6 05/13/2019 1625   VLDL 24.2 05/13/2019 1625   LDLCALC 133 (H) 05/13/2019 1625    LDLCALC 113 (H) 12/30/2016 1001    CBC    Component Value Date/Time   WBC 6.7 05/13/2019 1625   RBC 4.98 05/13/2019 1625   HGB 16.3 05/13/2019 1625   HGB 15.4 12/30/2016 1001   HCT 47.6 05/13/2019 1625   HCT 44.2 12/30/2016 1001   PLT 280.0 05/13/2019 1625   PLT 216 12/30/2016 1001   MCV 95.6 05/13/2019 1625   MCV 93 12/30/2016 1001   MCH 32.5 12/30/2016 1001   MCH 32.0 01/26/2016 1225  MCHC 34.3 05/13/2019 1625   RDW 13.5 05/13/2019 1625   RDW 13.5 12/30/2016 1001   LYMPHSABS 1.9 09/28/2015 1434   MONOABS 0.4 09/28/2015 1434   EOSABS 0.1 09/28/2015 1434   BASOSABS 0.0 09/28/2015 1434    Hgb A1C Lab Results  Component Value Date   HGBA1C 6.0 (H) 05/13/2019           Assessment & Plan:   ER Follow Up for Hypertensive Urgency:  ER notes, labs and imaging reviewed Will increase Losartan to 100 mg daily Continue HCTZ as currently prescribed No indication for repeat labs at this time Encouraged DASH diet and exercise for weight loss  RTC in 2 weeks, follow up for HTN Nicki Reaper, NP  This visit occurred during the SARS-CoV-2 public health emergency.  Safety protocols were in place, including screening questions prior to the visit, additional usage of staff PPE, and extensive cleaning of exam room while observing appropriate contact time as indicated for disinfecting solutions.

## 2020-01-03 NOTE — Patient Instructions (Signed)

## 2020-01-04 ENCOUNTER — Other Ambulatory Visit: Payer: Self-pay | Admitting: Podiatry

## 2020-01-04 ENCOUNTER — Other Ambulatory Visit (HOSPITAL_COMMUNITY): Payer: Self-pay | Admitting: Podiatry

## 2020-01-04 DIAGNOSIS — M25572 Pain in left ankle and joints of left foot: Secondary | ICD-10-CM

## 2020-01-17 ENCOUNTER — Other Ambulatory Visit: Payer: Self-pay

## 2020-01-17 ENCOUNTER — Ambulatory Visit
Admission: RE | Admit: 2020-01-17 | Discharge: 2020-01-17 | Disposition: A | Discharge status: Home / Self Care | Payer: BC Managed Care – PPO | Source: Ambulatory Visit | Attending: Podiatry | Admitting: Podiatry

## 2020-01-17 DIAGNOSIS — M65872 Other synovitis and tenosynovitis, left ankle and foot: Secondary | ICD-10-CM | POA: Diagnosis not present

## 2020-01-17 DIAGNOSIS — M19072 Primary osteoarthritis, left ankle and foot: Secondary | ICD-10-CM | POA: Diagnosis not present

## 2020-01-17 DIAGNOSIS — M25572 Pain in left ankle and joints of left foot: Secondary | ICD-10-CM | POA: Diagnosis not present

## 2020-01-17 DIAGNOSIS — S86312A Strain of muscle(s) and tendon(s) of peroneal muscle group at lower leg level, left leg, initial encounter: Secondary | ICD-10-CM | POA: Diagnosis not present

## 2020-01-17 DIAGNOSIS — Q666 Other congenital valgus deformities of feet: Secondary | ICD-10-CM | POA: Diagnosis not present

## 2020-01-21 ENCOUNTER — Ambulatory Visit: Payer: BC Managed Care – PPO | Admitting: Internal Medicine

## 2020-01-21 DIAGNOSIS — S86312D Strain of muscle(s) and tendon(s) of peroneal muscle group at lower leg level, left leg, subsequent encounter: Secondary | ICD-10-CM | POA: Diagnosis not present

## 2020-01-21 DIAGNOSIS — M2141 Flat foot [pes planus] (acquired), right foot: Secondary | ICD-10-CM | POA: Diagnosis not present

## 2020-01-21 DIAGNOSIS — M25572 Pain in left ankle and joints of left foot: Secondary | ICD-10-CM | POA: Diagnosis not present

## 2020-01-21 DIAGNOSIS — M76822 Posterior tibial tendinitis, left leg: Secondary | ICD-10-CM | POA: Diagnosis not present

## 2020-02-01 DIAGNOSIS — M62838 Other muscle spasm: Secondary | ICD-10-CM | POA: Diagnosis not present

## 2020-02-01 DIAGNOSIS — Z03818 Encounter for observation for suspected exposure to other biological agents ruled out: Secondary | ICD-10-CM | POA: Diagnosis not present

## 2020-02-01 DIAGNOSIS — S86312D Strain of muscle(s) and tendon(s) of peroneal muscle group at lower leg level, left leg, subsequent encounter: Secondary | ICD-10-CM | POA: Diagnosis not present

## 2020-02-01 DIAGNOSIS — Z20822 Contact with and (suspected) exposure to covid-19: Secondary | ICD-10-CM | POA: Diagnosis not present

## 2020-02-14 ENCOUNTER — Encounter: Payer: Self-pay | Admitting: Internal Medicine

## 2020-02-14 MED ORDER — LOSARTAN POTASSIUM 50 MG PO TABS
50.0000 mg | ORAL_TABLET | Freq: Every day | ORAL | 0 refills | Status: DC
Start: 2020-02-14 — End: 2020-02-18

## 2020-02-14 MED ORDER — HYDROCHLOROTHIAZIDE 12.5 MG PO TABS
12.5000 mg | ORAL_TABLET | Freq: Every day | ORAL | 0 refills | Status: DC
Start: 2020-02-14 — End: 2020-02-18

## 2020-02-18 ENCOUNTER — Encounter: Payer: Self-pay | Admitting: Internal Medicine

## 2020-02-18 MED ORDER — LOSARTAN POTASSIUM 50 MG PO TABS
50.0000 mg | ORAL_TABLET | Freq: Every day | ORAL | 0 refills | Status: DC
Start: 2020-02-18 — End: 2020-05-16

## 2020-02-18 MED ORDER — HYDROCHLOROTHIAZIDE 12.5 MG PO TABS
12.5000 mg | ORAL_TABLET | Freq: Every day | ORAL | 0 refills | Status: DC
Start: 2020-02-18 — End: 2021-01-17

## 2020-04-10 DIAGNOSIS — I69354 Hemiplegia and hemiparesis following cerebral infarction affecting left non-dominant side: Secondary | ICD-10-CM | POA: Diagnosis not present

## 2020-04-10 DIAGNOSIS — G4733 Obstructive sleep apnea (adult) (pediatric): Secondary | ICD-10-CM | POA: Diagnosis not present

## 2020-04-10 DIAGNOSIS — I10 Essential (primary) hypertension: Secondary | ICD-10-CM | POA: Diagnosis not present

## 2020-04-10 DIAGNOSIS — R0683 Snoring: Secondary | ICD-10-CM | POA: Diagnosis not present

## 2020-05-15 ENCOUNTER — Other Ambulatory Visit: Payer: Self-pay | Admitting: Internal Medicine

## 2020-05-19 ENCOUNTER — Encounter: Payer: Self-pay | Admitting: Internal Medicine

## 2020-08-14 ENCOUNTER — Other Ambulatory Visit: Payer: Self-pay | Admitting: Internal Medicine

## 2020-08-16 NOTE — Telephone Encounter (Signed)
Refill request Losartan Last refill 05/16/20 #90 Last office visit 01/03/20 No upcoming appointment scheduled

## 2020-11-12 ENCOUNTER — Other Ambulatory Visit: Payer: Self-pay | Admitting: Family

## 2021-01-17 ENCOUNTER — Other Ambulatory Visit: Payer: Self-pay | Admitting: Internal Medicine

## 2021-01-17 MED ORDER — LOSARTAN POTASSIUM 50 MG PO TABS
50.0000 mg | ORAL_TABLET | Freq: Every day | ORAL | 0 refills | Status: DC
Start: 2021-01-17 — End: 2021-03-22
  Filled 2021-01-17: qty 30, 30d supply, fill #0

## 2021-01-17 MED ORDER — HYDROCHLOROTHIAZIDE 12.5 MG PO TABS
12.5000 mg | ORAL_TABLET | Freq: Every day | ORAL | 0 refills | Status: DC
Start: 2021-01-17 — End: 2021-03-22
  Filled 2021-01-17: qty 30, 30d supply, fill #0

## 2021-01-17 NOTE — Telephone Encounter (Signed)
Patient advised that Nicki Reaper has left [place]. Patient would like to follow Rene Kocher to her new office. Provided that offices information and advised to call and set up follow up appointment.  Sending refill request to Nicki Reaper to see if this can be filled until patient gets in.

## 2021-01-18 ENCOUNTER — Other Ambulatory Visit: Payer: Self-pay

## 2021-01-19 ENCOUNTER — Other Ambulatory Visit: Payer: Self-pay

## 2021-03-22 ENCOUNTER — Ambulatory Visit (INDEPENDENT_AMBULATORY_CARE_PROVIDER_SITE_OTHER): Payer: 59 | Admitting: Internal Medicine

## 2021-03-22 ENCOUNTER — Other Ambulatory Visit: Payer: Self-pay

## 2021-03-22 ENCOUNTER — Encounter: Payer: Self-pay | Admitting: Internal Medicine

## 2021-03-22 VITALS — BP 171/106 | HR 82 | Temp 97.8°F | Resp 18 | Ht 67.5 in | Wt 309.6 lb

## 2021-03-22 DIAGNOSIS — R7303 Prediabetes: Secondary | ICD-10-CM | POA: Diagnosis not present

## 2021-03-22 DIAGNOSIS — Z8673 Personal history of transient ischemic attack (TIA), and cerebral infarction without residual deficits: Secondary | ICD-10-CM

## 2021-03-22 DIAGNOSIS — G4733 Obstructive sleep apnea (adult) (pediatric): Secondary | ICD-10-CM

## 2021-03-22 DIAGNOSIS — Z114 Encounter for screening for human immunodeficiency virus [HIV]: Secondary | ICD-10-CM

## 2021-03-22 DIAGNOSIS — I10 Essential (primary) hypertension: Secondary | ICD-10-CM | POA: Diagnosis not present

## 2021-03-22 DIAGNOSIS — I5032 Chronic diastolic (congestive) heart failure: Secondary | ICD-10-CM

## 2021-03-22 DIAGNOSIS — G8194 Hemiplegia, unspecified affecting left nondominant side: Secondary | ICD-10-CM

## 2021-03-22 DIAGNOSIS — Z1159 Encounter for screening for other viral diseases: Secondary | ICD-10-CM | POA: Diagnosis not present

## 2021-03-22 MED ORDER — LOSARTAN POTASSIUM 50 MG PO TABS: 50.0000 mg | ORAL_TABLET | Freq: Every day | ORAL | 1 refills | Status: DC

## 2021-03-22 MED ORDER — HYDROCHLOROTHIAZIDE 12.5 MG PO TABS: 12.5000 mg | ORAL_TABLET | Freq: Every day | ORAL | 1 refills | Status: DC

## 2021-03-22 MED ORDER — GABAPENTIN 100 MG PO CAPS: 100.0000 mg | ORAL_CAPSULE | Freq: Every day | ORAL | 1 refills | Status: DC

## 2021-03-22 NOTE — Progress Notes (Signed)
Subjective:    Patient ID: Brad Mcgee, male    DOB: 1985/11/12, 36 y.o.   MRN: [id number]  HPI  Pt presents to the clinic today for follow up of chronic conditions.  HTN: His BP today is 171/106. He has not had his medication today and reports he drove all night from [place] to get here. He checks it at home, the highest is 140/90. He is taking Losartan and HCTZ as prescribed. ECG from 07/2015 reviewed.  CHF: He denies lower extremity edema or shortness of breath. He is taking HCTZ as prescribed. Echo from 07/2015 reviewed.  HLD with Hx of Stroke with Left Hemiparesis: Hemorrhagic.His last LDL was 133, triglycerides  121, 04/2019. He is not taking any cholesterol lowering medication at this time. He does not consume a low fat diet.  He is not following with neurology.  Prediabetes: His last A1C was 6%, 04/2019. He is not taking any oral diabetic medication at this time. He does not check his sugars.  OSA: He averages 7 hours of sleep without the use of his CPAP. Sleep study from 10/2015 reviewed.  He has been having some left leg pain. He describes as prickley. He denies back pain, numbness, tingling, weakness or burning. He noticed this after his stroke but has been intermittent. Walking and stretching makes the pain better. He has taken his wifes Gabapentin with good relief of symptoms.   Review of Systems     Past Medical History:  Diagnosis Date   CVA (cerebral vascular accident) (HCC)    DVT (deep venous thrombosis) (HCC)    Fracture of left ankle    treated with cast   Hypertension     Current Outpatient Medications  Medication Sig Dispense Refill   aspirin 81 MG tablet Take 81 mg by mouth daily.     hydrochlorothiazide (HYDRODIURIL) 12.5 MG tablet Take 1 tablet (12.5 mg total) by mouth daily. 180 tablet 0   losartan (COZAAR) 50 MG tablet Take 1 tablet (50 mg total) by mouth daily. 90 tablet 0   No current facility-administered medications for this visit.    Allergies   Allergen Reactions   Bee Venom Anaphylaxis   Coconut Oil Anaphylaxis   Ancef [Cefazolin] Rash    Family History  Problem Relation Age of Onset   Hypertension Mother    Breast cancer Maternal Grandmother    Hypertension Maternal Grandmother    Prostate cancer Maternal Grandfather    Hypertension Maternal Grandfather    Hypertension Paternal Grandfather    Diabetes Paternal Grandfather    Multiple sclerosis Paternal Grandmother     Social History   Socioeconomic History   Marital status: Married    Spouse name: Not on file   Number of children: Not on file   Years of education: Not on file   Highest education level: Not on file  Occupational History   Not on file  Tobacco Use   Smoking status: Never   Smokeless tobacco: Never  Substance and Sexual Activity   Alcohol use: No   Drug use: No   Sexual activity: Never  Other Topics Concern   Not on file  Social History Narrative   Not on file   Social Determinants of Health   Financial Resource Strain: Not on file  Food Insecurity: Not on file  Transportation Needs: Not on file  Physical Activity: Not on file  Stress: Not on file  Social Connections: Not on file  Intimate Partner Violence: Not on file  Constitutional: Denies fever, malaise, fatigue, headache or abrupt weight changes.  Respiratory: Denies difficulty breathing, shortness of breath, cough or sputum production.   Cardiovascular: Denies chest pain, chest tightness, palpitations or swelling in the hands or feet.  Gastrointestinal: Denies abdominal pain, bloating, constipation, diarrhea or blood in the stool.  GU: Denies urgency, frequency, pain with urination, burning sensation, blood in urine, odor or discharge. Musculoskeletal: Pt reports left leg pain. Denies decrease in range of motion, difficulty with gait, muscle pain or joint swelling.  Skin: Denies redness, rashes, lesions or ulcercations.  Neurological: Denies dizziness, difficulty with  memory, difficulty with speech or problems with balance and coordination.  Psych: Denies anxiety, depression, SI/HI.  No other specific complaints in a complete review of systems (except as listed in HPI above).  Objective:   Physical Exam  BP (!) 171/106 (BP Location: Right Arm, Patient Position: Sitting, Cuff Size: Large)    Pulse 82    Temp 97.8 F (36.6 C) (Temporal)    Resp 18    Ht 5' 7.5" (1.715 m)    Wt (!) 309 lb 9.6 oz (140.4 kg)    SpO2 100%    BMI 47.77 kg/m   Wt Readings from Last 3 Encounters:  07/29/19 (!) 302 lb 12 oz (137.3 kg)  05/13/19 291 lb (132 kg)  07/04/18 275 lb (124.7 kg)    General: Appears his stated age, obese, in NAD. Skin: Warm, dry and intact.  HEENT: Head: normal shape and size; Eyes: EOMs intact;  Cardiovascular: Normal rate and rhythm. S1,S2 noted.  No murmur, rubs or gallops noted. No JVD or BLE edema. No carotid bruits noted. Pulmonary/Chest: Normal effort and positive vesicular breath sounds. No respiratory distress. No wheezes, rales or ronchi noted.  Musculoskeletal: No difficulty with gait.  Neurological: Alert and oriented.  Psychiatric: Mood and affect normal. Behavior is normal. Judgment and thought content normal.    BMET    Component Value Date/Time   NA 135 05/13/2019 1625   NA 140 12/30/2016 1001   K 4.0 05/13/2019 1625   CL 100 05/13/2019 1625   CO2 27 05/13/2019 1625   GLUCOSE 78 05/13/2019 1625   BUN 15 05/13/2019 1625   BUN 17 12/30/2016 1001   CREATININE 1.06 05/13/2019 1625   CALCIUM 9.9 05/13/2019 1625   GFRNONAA 86 12/30/2016 1001   GFRAA 99 12/30/2016 1001    Lipid Panel     Component Value Date/Time   CHOL 191 05/13/2019 1625   CHOL 166 12/30/2016 1001   TRIG 121.0 05/13/2019 1625   HDL 34.10 (L) 05/13/2019 1625   HDL 31 (L) 12/30/2016 1001   CHOLHDL 6 05/13/2019 1625   VLDL 24.2 05/13/2019 1625   LDLCALC 133 (H) 05/13/2019 1625   LDLCALC 113 (H) 12/30/2016 1001    CBC    Component Value Date/Time    WBC 6.7 05/13/2019 1625   RBC 4.98 05/13/2019 1625   HGB 16.3 05/13/2019 1625   HGB 15.4 12/30/2016 1001   HCT 47.6 05/13/2019 1625   HCT 44.2 12/30/2016 1001   PLT 280.0 05/13/2019 1625   PLT 216 12/30/2016 1001   MCV 95.6 05/13/2019 1625   MCV 93 12/30/2016 1001   MCH 32.5 12/30/2016 1001   MCH 32.0 01/26/2016 1225   MCHC 34.3 05/13/2019 1625   RDW 13.5 05/13/2019 1625   RDW 13.5 12/30/2016 1001   LYMPHSABS 1.9 09/28/2015 1434   MONOABS 0.4 09/28/2015 1434   EOSABS 0.1 09/28/2015 1434   BASOSABS 0.0  09/28/2015 1434    Hgb A1C Lab Results  Component Value Date   HGBA1C 6.0 (H) 05/13/2019           Assessment & Plan:    Nicki Reaperegina Lexany Belknap, NP This visit occurred during the SARS-CoV-2 public health emergency.  Safety protocols were in place, including screening questions prior to the visit, additional usage of staff PPE, and extensive cleaning of exam room while observing appropriate contact time as indicated for disinfecting solutions.

## 2021-03-22 NOTE — Assessment & Plan Note (Signed)
Not wearing CPAP Encouraged weight loss as this can help reduce sleep apnea symptoms 

## 2021-03-22 NOTE — Assessment & Plan Note (Signed)
C-Met and lipid profile today Discussed the importance of blood pressure control, Losartan and HCTZ- refilled today Encouraged him to consume a low-salt, low-fat diet

## 2021-03-22 NOTE — Assessment & Plan Note (Signed)
Uncontrolled today but has not taken his medication He reports this is better controlled at home Refilled Losartan and HCTZ today Reinforced DASH diet and exercise for weight loss C-Met today

## 2021-03-22 NOTE — Patient Instructions (Signed)

## 2021-03-22 NOTE — Assessment & Plan Note (Signed)
Mainly in his left lower extremity We will start gabapentin at his request

## 2021-03-22 NOTE — Assessment & Plan Note (Signed)
Compensated Continue Losartan and HCTZ, refilled today Reinforced DASH diet Monitor weights

## 2021-03-22 NOTE — Assessment & Plan Note (Signed)
Encourage diet and exercise for weight loss 

## 2021-03-22 NOTE — Assessment & Plan Note (Signed)
A1c today Encourage low-carb diet and exercise for weight loss 

## 2021-03-23 LAB — LIPID PANEL
Cholesterol: 214 mg/dL — ABNORMAL HIGH (ref <200)
HDL: 41 mg/dL (ref >=40)
LDL Cholesterol (Calc): 146 mg/dL (calc) — ABNORMAL HIGH
Non-HDL Cholesterol (Calc): 173 mg/dL (calc) — ABNORMAL HIGH (ref <130)
Total CHOL/HDL Ratio: 5.2 (calc) — ABNORMAL HIGH (ref <5.0)
Triglycerides: 144 mg/dL (ref <150)

## 2021-03-23 LAB — CBC
HCT: 46.3 % (ref 38.5–50.0)
Hemoglobin: 15.9 g/dL (ref 13.2–17.1)
MCH: 33.9 pg — ABNORMAL HIGH (ref 27.0–33.0)
MCHC: 34.3 g/dL (ref 32.0–36.0)
MCV: 98.7 fL (ref 80.0–100.0)
MPV: 9.9 fL (ref 7.5–12.5)
Platelets: 221 10*3/uL (ref 140–400)
RBC: 4.69 10*6/uL (ref 4.20–5.80)
RDW: 12.8 % (ref 11.0–15.0)
WBC: 4.3 10*3/uL (ref 3.8–10.8)

## 2021-03-23 LAB — HEPATITIS C ANTIBODY
Hepatitis C Ab: NONREACTIVE
SIGNAL TO CUT-OFF: 0.03 (ref <1.00)

## 2021-03-23 LAB — HEMOGLOBIN A1C
Hgb A1c MFr Bld: 6.2 % of total Hgb — ABNORMAL HIGH (ref <5.7)
Mean Plasma Glucose: 131 mg/dL
eAG (mmol/L): 7.3 mmol/L

## 2021-03-23 LAB — COMPLETE METABOLIC PANEL WITH GFR
AG Ratio: 1.6 (calc) (ref 1.0–2.5)
ALT: 29 U/L (ref 9–46)
AST: 22 U/L (ref 10–40)
Albumin: 4.4 g/dL (ref 3.6–5.1)
Alkaline phosphatase (APISO): 60 U/L (ref 36–130)
BUN: 16 mg/dL (ref 7–25)
CO2: 26 mmol/L (ref 20–32)
Calcium: 9.4 mg/dL (ref 8.6–10.3)
Chloride: 104 mmol/L (ref 98–110)
Creat: 1.04 mg/dL (ref 0.60–1.26)
Globulin: 2.8 g/dL (calc) (ref 1.9–3.7)
Glucose, Bld: 108 mg/dL — ABNORMAL HIGH (ref 65–99)
Potassium: 4.2 mmol/L (ref 3.5–5.3)
Sodium: 138 mmol/L (ref 135–146)
Total Bilirubin: 0.5 mg/dL (ref 0.2–1.2)
Total Protein: 7.2 g/dL (ref 6.1–8.1)
eGFR: 95 mL/min/{1.73_m2} (ref >=60)

## 2021-03-23 LAB — HIV ANTIBODY (ROUTINE TESTING W REFLEX): HIV 1&2 Ab, 4th Generation: NONREACTIVE

## 2021-06-13 ENCOUNTER — Telehealth: Payer: Self-pay | Admitting: Pharmacist

## 2021-06-13 NOTE — Telephone Encounter (Addendum)
? ?  Outreach Note ? ?06/13/2021 ?Name: Brad Mcgee MRN: [id number] DOB: 01-May-1985 ? ?Patient appearing on report for [facility] [place] [facility] Hypertension Control due to last documented ambulatory blood pressure of 171/106 on 03/22/2021. Next appointment with PCP is 09/20/2021  ? ?Outreached patient to discuss hypertension control and medication management.  ? ?Outpatient Encounter Medications as of 06/13/2021  ?Medication Sig  ? aspirin 81 MG tablet Take 81 mg by mouth daily.  ? gabapentin (NEURONTIN) 100 MG capsule Take 1 capsule (100 mg total) by mouth at bedtime.  ? hydrochlorothiazide (HYDRODIURIL) 12.5 MG tablet Take 1 tablet (12.5 mg total) by mouth daily.  ? losartan (COZAAR) 50 MG tablet Take 1 tablet (50 mg total) by mouth daily.  ? ?No facility-administered encounter medications on file as of 06/13/2021.  ? ? ?Hypertension: ?Current medications: HCTZ 12.5 mg daily, losartan 50 mg daily ?Patient has an automated upper arm home BP machine. ?Recalls last checked on 3/25: 140/86 ?Current physical activity: Walks ~5 minutes x 7 days/week ?Reports currently working on weight loss, down ~10 lbs over past 6 weeks ?Reports completed home sleep study as ordered by Neurology in 2022, but CPAP was unaffordable. ?Discuss importance of CPAP/OSA management ?Encourage to follow up with health plan regarding cost of CPAP ? ?Assessment/Plan: ?Currently uncontrolled ?Reviewed goal blood pressure <130/90 ?Counseled on long term microvascular and macrovascular complications of uncontrolled hypertension ?Reviewed appropriate home BP monitoring technique (avoid caffeine, smoking, and exercise for 30 minutes before checking, rest for at least 5 minutes before taking BP, sit with feet flat on the floor and back against a hard surface, uncross legs, and rest arm on flat surface) ?Reviewed to check blood pressure, document, and provide at next provider visit ?Discussed dietary modifications, such as reduced salt intake, focus on whole grains,  vegetables, lean proteins ?Discussed goal of 150 minutes of moderate intensity physical activity weekly ?Reviewed strategies to improve medication adherence such as using weekly pillbox or daily phone alarm ? ? ? ?Follow Up Plan: CM Pharmacist will outreach to patient by telephone again within the next 30 days ? ?Estelle Grumbles, PharmD, BCACP ?Clinical Pharmacist ?[facility] ?[phone] ? ?

## 2021-06-13 NOTE — Telephone Encounter (Incomplete Revision)
? ?  Outreach Note ? ?06/13/2021 ?Name: Brad Mcgee MRN: [id number] DOB: 03/05/1986 ? ?Patient appearing on report for [place] Metric Hypertension Control due to last documented ambulatory blood pressure of 171/106 on 03/22/2021. Next appointment with PCP is 09/20/2021  ? ?Outreached patient to discuss hypertension control and medication management.  ? ?Outpatient Encounter Medications as of 06/13/2021  ?Medication Sig  ? aspirin 81 MG tablet Take 81 mg by mouth daily.  ? gabapentin (NEURONTIN) 100 MG capsule Take 1 capsule (100 mg total) by mouth at bedtime.  ? hydrochlorothiazide (HYDRODIURIL) 12.5 MG tablet Take 1 tablet (12.5 mg total) by mouth daily.  ? losartan (COZAAR) 50 MG tablet Take 1 tablet (50 mg total) by mouth daily.  ? ?No facility-administered encounter medications on file as of 06/13/2021.  ? ? ?Hypertension: ?Current medications: HCTZ 12.5 mg daily, losartan 50 mg daily ?Patient has an automated upper arm home BP machine. ?Recalls last checked on 3/25: 140/86 ?Current physical activity: Walks ~5 minutes x 7 days/week ?Reports currently working on weight loss, down ~10 lbs over past 6 weeks ?Reports completed home sleep study as ordered by Neurology in 2022, but CPAP was unaffordable. ?Discuss importance of CPAP/OSA management ?Encourage to follow up with health plan regarding cost of CPAP ? ?Assessment/Plan: ?Currently uncontrolled ?Reviewed goal blood pressure <130/90 ?Counseled on long term microvascular and macrovascular complications of uncontrolled hypertension ?Reviewed appropriate home BP monitoring technique (avoid caffeine, smoking, and exercise for 30 minutes before checking, rest for at least 5 minutes before taking BP, sit with feet flat on the floor and back against a hard surface, uncross legs, and rest arm on flat surface) ?Reviewed to check blood pressure, document, and provide at next provider visit ?Discussed dietary modifications, such as reduced salt intake, focus on whole grains,  vegetables, lean proteins ?Discussed goal of 150 minutes of moderate intensity physical activity weekly ?Reviewed strategies to improve medication adherence such as using weekly pillbox or daily phone alarm ? ? ? ?Follow Up Plan: CM Pharmacist will outreach to patient by telephone again within the next 30 days ? ?Ahonesty Woodfin Lamontae Ricardo, PharmD, BCACP ?Clinical Pharmacist ?[facility] ?[phone] ? ?

## 2021-06-18 ENCOUNTER — Encounter: Payer: Self-pay | Admitting: Internal Medicine

## 2021-07-04 ENCOUNTER — Telehealth: Payer: 59

## 2021-07-30 ENCOUNTER — Ambulatory Visit: Payer: 59 | Admitting: Pharmacist

## 2021-07-30 DIAGNOSIS — I10 Essential (primary) hypertension: Secondary | ICD-10-CM

## 2021-07-30 NOTE — Patient Instructions (Signed)
Check your blood pressure once daily, and any time you have concerning symptoms like headache, chest pain, dizziness, shortness of breath, or vision changes.   Our goal is less than 130/80.  To appropriately check your blood pressure, make sure you do the following:  1) Avoid caffeine, exercise, or tobacco products for 30 minutes before checking. Empty your bladder. 2) Sit with your back supported in a flat-backed chair. Rest your arm on something flat (arm of the chair, table, etc). 3) Sit still with your feet flat on the floor, resting, for at least 5 minutes.  4) Check your blood pressure. Take 1-2 readings.  5) Write down these readings and bring with you to any provider appointments.  Bring your home blood pressure machine with you to a provider's office for accuracy comparison at least once a year.   Make sure you take your blood pressure medications before you come to any office visit, even if you were asked to fast for labs.  Lulamae Skorupski Lyndell Gillyard, PharmD, BCACP Clinical Pharmacist [facility] [place] [phone]  

## 2021-07-30 NOTE — Chronic Care Management (AMB) (Signed)
?  Chronic Care Management  ? ?Outreach Note ? ?07/30/2021 ?Name: Brad Mcgee MRN: [id number] DOB: [birth year] ? ?I connected with Brad Mcgee on 07/30/21 by telephone outreach and verified that I am speaking with the correct person using two identifiers. ? ?Patient appearing on report for [place] [facility] Hypertension Control due to last documented ambulatory blood pressure of 171/106 on 03/22/2021. Next appointment with PCP is 09/20/2021  ? ?Outreached patient to discuss hypertension control and medication management.  ? ?Outpatient Encounter Medications as of 07/30/2021  ?Medication Sig  ? aspirin 81 MG tablet Take 81 mg by mouth daily.  ? gabapentin (NEURONTIN) 100 MG capsule Take 1 capsule (100 mg total) by mouth at bedtime.  ? hydrochlorothiazide (HYDRODIURIL) 12.5 MG tablet Take 1 tablet (12.5 mg total) by mouth daily.  ? losartan (COZAAR) 50 MG tablet Take 1 tablet (50 mg total) by mouth daily.  ? ?No facility-administered encounter medications on file as of 07/30/2021.  ? ? ?Lab Results  ?Component Value Date  ? CREATININE 1.04 03/22/2021  ? BUN 16 03/22/2021  ? NA 138 03/22/2021  ? K 4.2 03/22/2021  ? CL 104 03/22/2021  ? CO2 26 03/22/2021  ? ? ?BP Readings from Last 3 Encounters:  ?03/22/21 (!) 171/106  ?01/03/20 (!) 164/108  ?07/29/19 (!) 144/98  ? ? ?Pulse Readings from Last 3 Encounters:  ?03/22/21 82  ?01/03/20 86  ?07/29/19 79  ? ? ?Hypertension: ?Current medications: HCTZ 12.5 mg daily, losartan 50 mg daily ?Patient has an automated upper arm home BP machine. ?Reports checked today and recalls reading ~138/90s ? ?CPAP/OSA: ?Note patient previously reported/ per review of chart, completed home sleep study as ordered by Neurology in 2022, but CPAP was unaffordable. ?Discussed importance of CPAP/OSA management ?Today offer patient referral to Care Guide team for resources to help with affordability of obtaining CPAP, but patient declines at this time ? ?Today reports has a lot going on in his life at this  time and not currently ready to focus on improving blood pressure control at this time. ? ? ?Assessment/Plan: ?- Currently uncontrolled ?- Reviewed goal blood pressure <130/80 ?- Counseled on long term microvascular and macrovascular complications of uncontrolled hypertension ?- Encourage to check blood pressure, document, provide at next provider visit, but to call office sooner if readings persistently >130/80 ? ? ?Follow Up Plan: Patient denies need to schedule further follow up with CM Pharmacist at this time, but confirms has contact information for CM Pharmacist. ? ?Estelle Grumbles, PharmD, BCACP ?Clinical Pharmacist ?[facility] ?[place] ?[phone] ? ?

## 2021-09-20 ENCOUNTER — Ambulatory Visit (INDEPENDENT_AMBULATORY_CARE_PROVIDER_SITE_OTHER): Payer: 59 | Admitting: Internal Medicine

## 2021-09-20 ENCOUNTER — Encounter: Payer: Self-pay | Admitting: Internal Medicine

## 2021-09-20 VITALS — BP 146/94 | HR 77 | Temp 96.8°F | Wt 298.0 lb

## 2021-09-20 DIAGNOSIS — I10 Essential (primary) hypertension: Secondary | ICD-10-CM

## 2021-09-20 DIAGNOSIS — Z0001 Encounter for general adult medical examination with abnormal findings: Secondary | ICD-10-CM | POA: Diagnosis not present

## 2021-09-20 DIAGNOSIS — R7303 Prediabetes: Secondary | ICD-10-CM | POA: Diagnosis not present

## 2021-09-20 MED ORDER — LOSARTAN POTASSIUM-HCTZ 100-12.5 MG PO TABS: 1.0000 | ORAL_TABLET | Freq: Every day | ORAL | 1 refills | Status: DC

## 2021-09-20 MED ORDER — GABAPENTIN 100 MG PO CAPS: 100.0000 mg | ORAL_CAPSULE | Freq: Every day | ORAL | 1 refills | Status: DC

## 2021-09-20 NOTE — Patient Instructions (Signed)
Health Maintenance, Male Adopting a healthy lifestyle and getting preventive care are important in promoting health and wellness. Ask your health care provider about: The right schedule for you to have regular tests and exams. Things you can do on your own to prevent diseases and keep yourself healthy. What should I know about diet, weight, and exercise? Eat a healthy diet  Eat a diet that includes plenty of vegetables, fruits, low-fat dairy products, and lean protein. Do not eat a lot of foods that are high in solid fats, added sugars, or sodium. Maintain a healthy weight Body mass index (BMI) is a measurement that can be used to identify possible weight problems. It estimates body fat based on height and weight. Your health care provider can help determine your BMI and help you achieve or maintain a healthy weight. Get regular exercise Get regular exercise. This is one of the most important things you can do for your health. Most adults should: Exercise for at least 150 minutes each week. The exercise should increase your heart rate and make you sweat (moderate-intensity exercise). Do strengthening exercises at least twice a week. This is in addition to the moderate-intensity exercise. Spend less time sitting. Even light physical activity can be beneficial. Watch cholesterol and blood lipids Have your blood tested for lipids and cholesterol at 37 years of age, then have this test every 5 years. You may need to have your cholesterol levels checked more often if: Your lipid or cholesterol levels are high. You are older than 37 years of age. You are at high risk for heart disease. What should I know about cancer screening? Many types of cancers can be detected early and may often be prevented. Depending on your health history and family history, you may need to have cancer screening at various ages. This may include screening for: Colorectal cancer. Prostate cancer. Skin cancer. Lung  cancer. What should I know about heart disease, diabetes, and high blood pressure? Blood pressure and heart disease High blood pressure causes heart disease and increases the risk of stroke. This is more likely to develop in people who have high blood pressure readings or are overweight. Talk with your health care provider about your target blood pressure readings. Have your blood pressure checked: Every 3-5 years if you are 18-39 years of age. Every year if you are 40 years old or older. If you are between the ages of 65 and 75 and are a current or former smoker, ask your health care provider if you should have a one-time screening for abdominal aortic aneurysm (AAA). Diabetes Have regular diabetes screenings. This checks your fasting blood sugar level. Have the screening done: Once every three years after age 45 if you are at a normal weight and have a low risk for diabetes. More often and at a younger age if you are overweight or have a high risk for diabetes. What should I know about preventing infection? Hepatitis B If you have a higher risk for hepatitis B, you should be screened for this virus. Talk with your health care provider to find out if you are at risk for hepatitis B infection. Hepatitis C Blood testing is recommended for: Everyone born from 1945 through 1965. Anyone with known risk factors for hepatitis C. Sexually transmitted infections (STIs) You should be screened each year for STIs, including gonorrhea and chlamydia, if: You are sexually active and are younger than 37 years of age. You are older than 37 years of age and your   health care provider tells you that you are at risk for this type of infection. Your sexual activity has changed since you were last screened, and you are at increased risk for chlamydia or gonorrhea. Ask your health care provider if you are at risk. Ask your health care provider about whether you are at high risk for HIV. Your health care provider  may recommend a prescription medicine to help prevent HIV infection. If you choose to take medicine to prevent HIV, you should first get tested for HIV. You should then be tested every 3 months for as long as you are taking the medicine. Follow these instructions at home: Alcohol use Do not drink alcohol if your health care provider tells you not to drink. If you drink alcohol: Limit how much you have to 0-2 drinks a day. Know how much alcohol is in your drink. In the [place] one drink equals one 12 oz bottle of beer (355 mL), one 5 oz glass of wine (148 mL), or one 1 oz glass of hard liquor (44 mL). Lifestyle Do not use any products that contain nicotine or tobacco. These products include cigarettes, chewing tobacco, and vaping devices, such as e-cigarettes. If you need help quitting, ask your health care provider. Do not use street drugs. Do not share needles. Ask your health care provider for help if you need support or information about quitting drugs. General instructions Schedule regular health, dental, and eye exams. Stay current with your vaccines. Tell your health care provider if: You often feel depressed. You have ever been abused or do not feel safe at home. Summary Adopting a healthy lifestyle and getting preventive care are important in promoting health and wellness. Follow your health care provider's instructions about healthy diet, exercising, and getting tested or screened for diseases. Follow your health care provider's instructions on monitoring your cholesterol and blood pressure. This information is not intended to replace advice given to you by your health care provider. Make sure you discuss any questions you have with your health care provider. Document Revised: 07/24/2020 Document Reviewed: 07/24/2020 Elsevier Patient Education  2023 [employer] Inc.  

## 2021-09-20 NOTE — Assessment & Plan Note (Signed)
Encouraged diet and exercise for weight loss ?

## 2021-09-20 NOTE — Assessment & Plan Note (Signed)
Uncontrolled Increase Losartan HCT to 100-12.5 mg daily Reinforced DASH diet and exercise for weight loss

## 2021-09-20 NOTE — Progress Notes (Signed)
Subjective:    Patient ID: Brad Mcgee, male    DOB: 02/01/1986, 36 y.o.   MRN: [id number]  HPI  Patient presents to clinic today for his annual exam.  Of note, his BP today is 146/94.  He is taking Losartan and HCTZ as prescribed.  Flu: never Tetanus: 01/2017 COVID: x2 Dentist: biannually  Diet: He does eat meat. He consumes more veggies than fruits. He does eat some fried foods. He drinks mostly water and tea. Exercise: Walking  Review of Systems     Past Medical History:  Diagnosis Date   CVA (cerebral vascular accident) ([place])    DVT (deep venous thrombosis) ([place])    Fracture of left ankle    treated with cast   Hypertension     Current Outpatient Medications  Medication Sig Dispense Refill   aspirin 81 MG tablet Take 81 mg by mouth daily.     gabapentin (NEURONTIN) 100 MG capsule Take 1 capsule (100 mg total) by mouth at bedtime. 90 capsule 1   hydrochlorothiazide (HYDRODIURIL) 12.5 MG tablet Take 1 tablet (12.5 mg total) by mouth daily. 90 tablet 1   losartan (COZAAR) 50 MG tablet Take 1 tablet (50 mg total) by mouth daily. 90 tablet 1   No current facility-administered medications for this visit.    Allergies  Allergen Reactions   Bee Venom Anaphylaxis   Coconut (Cocos Nucifera) Anaphylaxis   Ancef [Cefazolin] Rash    Family History  Problem Relation Age of Onset   Hypertension Mother    Breast cancer Maternal Grandmother    Hypertension Maternal Grandmother    Prostate cancer Maternal Grandfather    Hypertension Maternal Grandfather    Hypertension Paternal Grandfather    Diabetes Paternal Grandfather    Multiple sclerosis Paternal Grandmother     Social History   Socioeconomic History   Marital status: Married    Spouse name: Not on file   Number of children: Not on file   Years of education: Not on file   Highest education level: Not on file  Occupational History   Not on file  Tobacco Use   Smoking status: Never   Smokeless tobacco:  Never  Substance and Sexual Activity   Alcohol use: No   Drug use: No   Sexual activity: Never  Other Topics Concern   Not on file  Social History Narrative   Not on file   Social Determinants of Health   Financial Resource Strain: Not on file  Food Insecurity: Not on file  Transportation Needs: Not on file  Physical Activity: Not on file  Stress: Not on file  Social Connections: Not on file  Intimate Partner Violence: Not on file     Constitutional: Denies fever, malaise, fatigue, headache or abrupt weight changes.  HEENT: Denies eye pain, eye redness, ear pain, ringing in the ears, wax buildup, runny nose, nasal congestion, bloody nose, or sore throat. Respiratory: Denies difficulty breathing, shortness of breath, cough or sputum production.   Cardiovascular: Denies chest pain, chest tightness, palpitations or swelling in the hands or feet.  Gastrointestinal: Denies abdominal pain, bloating, constipation, diarrhea or blood in the stool.  GU: Denies urgency, frequency, pain with urination, burning sensation, blood in urine, odor or discharge. Musculoskeletal: Pt reports left upper extremity weakness. Denies decrease in range of motion, difficulty with gait, muscle pain or joint pain and swelling.  Skin: Denies redness, rashes, lesions or ulcercations.  Neurological: Denies dizziness, difficulty with memory, difficulty with speech or  problems with balance and coordination.  Psych: Denies anxiety, depression, SI/HI.  No other specific complaints in a complete review of systems (except as listed in HPI above).  Objective:   Physical Exam BP (!) 146/94 (BP Location: Right Arm, Patient Position: Sitting, Cuff Size: Large)   Pulse 77   Temp (!) 96.8 F (36 C) (Temporal)   Wt 298 lb (135.2 kg)   SpO2 99%   BMI 45.98 kg/m   Wt Readings from Last 3 Encounters:  03/22/21 (!) 309 lb 9.6 oz (140.4 kg)  07/29/19 (!) 302 lb 12 oz (137.3 kg)  05/13/19 291 lb (132 kg)    General:  Appears his stated age, obese, in NAD. Skin: Warm, dry and intact.  HEENT: Head: normal shape and size; Eyes: sclera white, no icterus, conjunctiva pink, pupils pinpoint but PERRLA and EOMs intact;  Neck:  Neck supple, trachea midline. No masses, lumps or thyromegaly present.  Cardiovascular: Normal rate and rhythm. S1,S2 noted.  No murmur, rubs or gallops noted. No JVD or BLE edema.  Pulmonary/Chest: Normal effort and positive vesicular breath sounds. No respiratory distress. No wheezes, rales or ronchi noted.  Abdomen: Soft and nontender. Normal bowel sounds.  Musculoskeletal: Strength 4/5 LUE.  Strength 5/5 RUE and BLE.  No difficulty with gait.  Neurological: Alert and oriented. He is under the influence of THC. Cranial nerves II-XII grossly intact. Coordination normal.  Psychiatric: Mood and affect normal. Behavior is normal. Judgment and thought content normal.    BMET    Component Value Date/Time   NA 138 03/22/2021 0909   NA 140 12/30/2016 1001   K 4.2 03/22/2021 0909   CL 104 03/22/2021 0909   CO2 26 03/22/2021 0909   GLUCOSE 108 (H) 03/22/2021 0909   BUN 16 03/22/2021 0909   BUN 17 12/30/2016 1001   CREATININE 1.04 03/22/2021 0909   CALCIUM 9.4 03/22/2021 0909   GFRNONAA 86 12/30/2016 1001   GFRAA 99 12/30/2016 1001    Lipid Panel     Component Value Date/Time   CHOL 214 (H) 03/22/2021 0909   CHOL 166 12/30/2016 1001   TRIG 144 03/22/2021 0909   HDL 41 03/22/2021 0909   HDL 31 (L) 12/30/2016 1001   CHOLHDL 5.2 (H) 03/22/2021 0909   VLDL 24.2 05/13/2019 1625   LDLCALC 146 (H) 03/22/2021 0909    CBC    Component Value Date/Time   WBC 4.3 03/22/2021 0909   RBC 4.69 03/22/2021 0909   HGB 15.9 03/22/2021 0909   HGB 15.4 12/30/2016 1001   HCT 46.3 03/22/2021 0909   HCT 44.2 12/30/2016 1001   PLT 221 03/22/2021 0909   PLT 216 12/30/2016 1001   MCV 98.7 03/22/2021 0909   MCV 93 12/30/2016 1001   MCH 33.9 (H) 03/22/2021 0909   MCHC 34.3 03/22/2021 0909   RDW  12.8 03/22/2021 0909   RDW 13.5 12/30/2016 1001   LYMPHSABS 1.9 09/28/2015 1434   MONOABS 0.4 09/28/2015 1434   EOSABS 0.1 09/28/2015 1434   BASOSABS 0.0 09/28/2015 1434    Hgb A1C Lab Results  Component Value Date   HGBA1C 6.2 (H) 03/22/2021           Assessment & Plan:   Preventative Health Maintenance:  Encouraged him to get a flu shot in the fall Tetanus UTD Encouraged him to get a COVID-vaccine Encouraged him to consume a balanced diet and exercise regimen Advised him to see an eye doctor and dentist annually We will check CBC, c-Met,  lipid, A1c today  RTC in 2 weeks for BP check and 6 months, follow-up chronic conditions [name], NP

## 2021-09-21 LAB — COMPLETE METABOLIC PANEL WITH GFR
AG Ratio: 1.3 (calc) (ref 1.0–2.5)
ALT: 33 U/L (ref 9–46)
AST: 22 U/L (ref 10–40)
Albumin: 3.9 g/dL (ref 3.6–5.1)
Alkaline phosphatase (APISO): 59 U/L (ref 36–130)
BUN: 12 mg/dL (ref 7–25)
CO2: 26 mmol/L (ref 20–32)
Calcium: 9.3 mg/dL (ref 8.6–10.3)
Chloride: 103 mmol/L (ref 98–110)
Creat: 0.95 mg/dL (ref 0.60–1.26)
Globulin: 2.9 g/dL (calc) (ref 1.9–3.7)
Glucose, Bld: 105 mg/dL — ABNORMAL HIGH (ref 65–99)
Potassium: 4 mmol/L (ref 3.5–5.3)
Sodium: 138 mmol/L (ref 135–146)
Total Bilirubin: 0.4 mg/dL (ref 0.2–1.2)
Total Protein: 6.8 g/dL (ref 6.1–8.1)
eGFR: 106 mL/min/{1.73_m2} (ref >=60)

## 2021-09-21 LAB — LIPID PANEL
Cholesterol: 143 mg/dL (ref <200)
HDL: 23 mg/dL — ABNORMAL LOW (ref >=40)
LDL Cholesterol (Calc): 97 mg/dL (calc)
Non-HDL Cholesterol (Calc): 120 mg/dL (calc) (ref <130)
Total CHOL/HDL Ratio: 6.2 (calc) — ABNORMAL HIGH (ref <5.0)
Triglycerides: 132 mg/dL (ref <150)

## 2021-09-21 LAB — CBC
HCT: 44.2 % (ref 38.5–50.0)
Hemoglobin: 15 g/dL (ref 13.2–17.1)
MCH: 32.8 pg (ref 27.0–33.0)
MCHC: 33.9 g/dL (ref 32.0–36.0)
MCV: 96.5 fL (ref 80.0–100.0)
MPV: 9 fL (ref 7.5–12.5)
Platelets: 218 10*3/uL (ref 140–400)
RBC: 4.58 10*6/uL (ref 4.20–5.80)
RDW: 12.6 % (ref 11.0–15.0)
WBC: 4 10*3/uL (ref 3.8–10.8)

## 2021-09-21 LAB — HEMOGLOBIN A1C
Hgb A1c MFr Bld: 6.1 % of total Hgb — ABNORMAL HIGH (ref <5.7)
Mean Plasma Glucose: 128 mg/dL
eAG (mmol/L): 7.1 mmol/L

## 2021-10-11 ENCOUNTER — Ambulatory Visit (INDEPENDENT_AMBULATORY_CARE_PROVIDER_SITE_OTHER): Payer: 59 | Admitting: Internal Medicine

## 2021-10-11 ENCOUNTER — Encounter: Payer: Self-pay | Admitting: Internal Medicine

## 2021-10-11 DIAGNOSIS — I10 Essential (primary) hypertension: Secondary | ICD-10-CM

## 2021-10-11 NOTE — Assessment & Plan Note (Signed)
Encourage diet and exercise for weight loss 

## 2021-10-11 NOTE — Assessment & Plan Note (Signed)
Discussed the importance of taking his medication prior to his appointments Discussed the risk of heart attack, stroke and death given chronically elevated blood pressure.  He states "if it is going to happen, it is going to happen, there is nothing I can do about it" Continue losartan HCT 100-12.5 mg Reinforced DASH diet and exercise for weight loss

## 2021-10-11 NOTE — Progress Notes (Signed)
Subjective:    Patient ID: Brad Mcgee, male    DOB: 1986/02/23, 36 y.o.   MRN: [id number]  HPI  Patient presents to clinic today for 2-week follow-up of HTN.  At his last visit his Losartan HCT was increased to 100-12.5 mg daily.  He has been taking the medication as prescribed.  His BP today is 156/122 but he admits he did not take his blood pressure medication this morning and he has been rushing. He has been monitering his blood pressure at home which typically runs 140/90.  ECG from 07/2015 reviewed.     Review of Systems     Past Medical History:  Diagnosis Date   CVA (cerebral vascular accident) ([facility])    DVT (deep venous thrombosis) ([facility])    Fracture of left ankle    treated with cast   Hypertension     Current Outpatient Medications  Medication Sig Dispense Refill   aspirin 81 MG tablet Take 81 mg by mouth daily. (Patient not taking: Reported on 09/20/2021)     gabapentin (NEURONTIN) 100 MG capsule Take 1 capsule (100 mg total) by mouth at bedtime. 90 capsule 1   losartan-hydrochlorothiazide (HYZAAR) 100-12.5 MG tablet Take 1 tablet by mouth daily. 90 tablet 1   No current facility-administered medications for this visit.    Allergies  Allergen Reactions   Bee Venom Anaphylaxis   Coconut (Cocos Nucifera) Anaphylaxis   Ancef [Cefazolin] Rash    Family History  Problem Relation Age of Onset   Hypertension Mother    Breast cancer Maternal Grandmother    Hypertension Maternal Grandmother    Prostate cancer Maternal Grandfather    Hypertension Maternal Grandfather    Hypertension Paternal Grandfather    Diabetes Paternal Grandfather    Multiple sclerosis Paternal Grandmother     Social History   Socioeconomic History   Marital status: Married    Spouse name: Not on file   Number of children: Not on file   Years of education: Not on file   Highest education level: Not on file  Occupational History   Not on file  Tobacco Use   Smoking status: Never    Smokeless tobacco: Never  Vaping Use   Vaping Use: Never used  Substance and Sexual Activity   Alcohol use: No   Drug use: No   Sexual activity: Never  Other Topics Concern   Not on file  Social History Narrative   Not on file   Social Determinants of Health   Financial Resource Strain: Not on file  Food Insecurity: Not on file  Transportation Needs: Not on file  Physical Activity: Not on file  Stress: Not on file  Social Connections: Not on file  Intimate Partner Violence: Not on file     Constitutional: Denies fever, malaise, fatigue, headache or abrupt weight changes.  HEENT: Denies eye pain, eye redness, ear pain, ringing in the ears, wax buildup, runny nose, nasal congestion, bloody nose, or sore throat. Respiratory: Denies difficulty breathing, shortness of breath, cough or sputum production.   Cardiovascular: Denies chest pain, chest tightness, palpitations or swelling in the hands or feet.  Neurological: Denies dizziness, difficulty with memory, difficulty with speech or problems with balance and coordination.    No other specific complaints in a complete review of systems (except as listed in HPI above).  Objective:   Physical Exam BP (!) 156/112 (BP Location: Left Arm, Patient Position: Sitting, Cuff Size: Large)   Pulse 85   Temp Marland Kitchen)  96.8 F (36 C) (Temporal)   Wt (!) 306 lb (138.8 kg)   SpO2 98%   BMI 47.22 kg/m   Wt Readings from Last 3 Encounters:  09/20/21 298 lb (135.2 kg)  03/22/21 (!) 309 lb 9.6 oz (140.4 kg)  07/29/19 (!) 302 lb 12 oz (137.3 kg)    General: Appears his stated age, obese, in NAD. Cardiovascular: Normal rate and rhythm.  Pulmonary/Chest: Normal effort and positive vesicular breath sounds. No respiratory distress. No wheezes, rales or ronchi noted.  Musculoskeletal: No difficulty with gait.  Neurological: Alert and oriented.  Coordination normal.    BMET    Component Value Date/Time   NA 138 09/20/2021 0819   NA 140  12/30/2016 1001   K 4.0 09/20/2021 0819   CL 103 09/20/2021 0819   CO2 26 09/20/2021 0819   GLUCOSE 105 (H) 09/20/2021 0819   BUN 12 09/20/2021 0819   BUN 17 12/30/2016 1001   CREATININE 0.95 09/20/2021 0819   CALCIUM 9.3 09/20/2021 0819   GFRNONAA 86 12/30/2016 1001   GFRAA 99 12/30/2016 1001    Lipid Panel     Component Value Date/Time   CHOL 143 09/20/2021 0819   CHOL 166 12/30/2016 1001   TRIG 132 09/20/2021 0819   HDL 23 (L) 09/20/2021 0819   HDL 31 (L) 12/30/2016 1001   CHOLHDL 6.2 (H) 09/20/2021 0819   VLDL 24.2 05/13/2019 1625   LDLCALC 97 09/20/2021 0819    CBC    Component Value Date/Time   WBC 4.0 09/20/2021 0819   RBC 4.58 09/20/2021 0819   HGB 15.0 09/20/2021 0819   HGB 15.4 12/30/2016 1001   HCT 44.2 09/20/2021 0819   HCT 44.2 12/30/2016 1001   PLT 218 09/20/2021 0819   PLT 216 12/30/2016 1001   MCV 96.5 09/20/2021 0819   MCV 93 12/30/2016 1001   MCH 32.8 09/20/2021 0819   MCHC 33.9 09/20/2021 0819   RDW 12.6 09/20/2021 0819   RDW 13.5 12/30/2016 1001   LYMPHSABS 1.9 09/28/2015 1434   MONOABS 0.4 09/28/2015 1434   EOSABS 0.1 09/28/2015 1434   BASOSABS 0.0 09/28/2015 1434    Hgb A1C Lab Results  Component Value Date   HGBA1C 6.1 (H) 09/20/2021            Assessment & Plan:     RTC in 2 weeks for BP check 6 months for your annual exam Nicki Reaper, NP

## 2021-10-11 NOTE — Patient Instructions (Signed)
Hypertension, Adult ?Hypertension is another name for high blood pressure. High blood pressure forces your heart to work harder to pump blood. This can cause problems over time. ?There are two numbers in a blood pressure reading. There is a top number (systolic) over a bottom number (diastolic). It is best to have a blood pressure that is below 120/80. ?What are the causes? ?The cause of this condition is not known. Some other conditions can lead to high blood pressure. ?What increases the risk? ?Some lifestyle factors can make you more likely to develop high blood pressure: ?Smoking. ?Not getting enough exercise or physical activity. ?Being overweight. ?Having too much fat, sugar, calories, or salt (sodium) in your diet. ?Drinking too much alcohol. ?Other risk factors include: ?Having any of these conditions: ?Heart disease. ?Diabetes. ?High cholesterol. ?Kidney disease. ?Obstructive sleep apnea. ?Having a family history of high blood pressure and high cholesterol. ?Age. The risk increases with age. ?Stress. ?What are the signs or symptoms? ?High blood pressure may not cause symptoms. Very high blood pressure (hypertensive crisis) may cause: ?Headache. ?Fast or uneven heartbeats (palpitations). ?Shortness of breath. ?Nosebleed. ?Vomiting or feeling like you may vomit (nauseous). ?Changes in how you see. ?Very bad chest pain. ?Feeling dizzy. ?Seizures. ?How is this treated? ?This condition is treated by making healthy lifestyle changes, such as: ?Eating healthy foods. ?Exercising more. ?Drinking less alcohol. ?Your doctor may prescribe medicine if lifestyle changes do not help enough and if: ?Your top number is above 130. ?Your bottom number is above 80. ?Your personal target blood pressure may vary. ?Follow these instructions at home: ?Eating and drinking ? ?If told, follow the DASH eating plan. To follow this plan: ?Fill one half of your plate at each meal with fruits and vegetables. ?Fill one fourth of your plate  at each meal with whole grains. Whole grains include whole-wheat pasta, [place] rice, and whole-grain bread. ?Eat or drink low-fat dairy products, such as skim milk or low-fat yogurt. ?Fill one fourth of your plate at each meal with low-fat (lean) proteins. Low-fat proteins include fish, chicken without skin, eggs, beans, and tofu. ?Avoid fatty meat, cured and processed meat, or chicken with skin. ?Avoid pre-made or processed food. ?Limit the amount of salt in your diet to less than 1,500 mg each day. ?Do not drink alcohol if: ?Your doctor tells you not to drink. ?You are pregnant, may be pregnant, or are planning to become pregnant. ?If you drink alcohol: ?Limit how much you have to: ?0-1 drink a day for women. ?0-2 drinks a day for men. ?Know how much alcohol is in your drink. In the [place] one drink equals one 12 oz bottle of beer (355 mL), one 5 oz glass of wine (148 mL), or one 1? oz glass of hard liquor (44 mL). ?Lifestyle ? ?Work with your doctor to stay at a healthy weight or to lose weight. Ask your doctor what the best weight is for you. ?Get at least 30 minutes of exercise that causes your heart to beat faster (aerobic exercise) most days of the week. This may include walking, swimming, or biking. ?Get at least 30 minutes of exercise that strengthens your muscles (resistance exercise) at least 3 days a week. This may include lifting weights or doing Pilates. ?Do not smoke or use any products that contain nicotine or tobacco. If you need help quitting, ask your doctor. ?Check your blood pressure at home as told by your doctor. ?Keep all follow-up visits. ?Medicines ?Take over-the-counter and prescription medicines   only as told by your doctor. Follow directions carefully. ?Do not skip doses of blood pressure medicine. The medicine does not work as well if you skip doses. Skipping doses also puts you at risk for problems. ?Ask your doctor about side effects or reactions to medicines that you should watch  for. ?Contact a doctor if: ?You think you are having a reaction to the medicine you are taking. ?You have headaches that keep coming back. ?You feel dizzy. ?You have swelling in your ankles. ?You have trouble with your vision. ?Get help right away if: ?You get a very bad headache. ?You start to feel mixed up (confused). ?You feel weak or numb. ?You feel faint. ?You have very bad pain in your: ?Chest. ?Belly (abdomen). ?You vomit more than once. ?You have trouble breathing. ?These symptoms may be an emergency. Get help right away. Call 911. ?Do not wait to see if the symptoms will go away. ?Do not drive yourself to the hospital. ?Summary ?Hypertension is another name for high blood pressure. ?High blood pressure forces your heart to work harder to pump blood. ?For most people, a normal blood pressure is less than 120/80. ?Making healthy choices can help lower blood pressure. If your blood pressure does not get lower with healthy choices, you may need to take medicine. ?This information is not intended to replace advice given to you by your health care provider. Make sure you discuss any questions you have with your health care provider. ?Document Revised: 12/21/2020 Document Reviewed: 12/21/2020 ?Elsevier Patient Education ? 2023 [employer] Inc[employer] ? ?

## 2021-10-24 ENCOUNTER — Other Ambulatory Visit: Payer: Self-pay | Admitting: Internal Medicine

## 2021-10-24 NOTE — Telephone Encounter (Signed)
Medication was discontinued 09/20/2021. Pt now taking combination med which includes this medication. Requested Prescriptions  Pending Prescriptions Disp Refills  . hydrochlorothiazide (HYDRODIURIL) 12.5 MG tablet [Pharmacy Med Name: HYDROCHLOROTHIAZIDE 12.5 MG TAB] 90 tablet 1    Sig: TAKE (1) TABLET BY MOUTH EVERY DAY     Cardiovascular: Diuretics - Thiazide Failed - 10/24/2021 10:41 AM      Failed - Last BP in normal range    BP Readings from Last 1 Encounters:  10/11/21 (!) 156/112         Passed - Cr in normal range and within 180 days    Creat  Date Value Ref Range Status  09/20/2021 0.95 0.60 - 1.26 mg/dL Final         Passed - K in normal range and within 180 days    Potassium  Date Value Ref Range Status  09/20/2021 4.0 3.5 - 5.3 mmol/L Final         Passed - Na in normal range and within 180 days    Sodium  Date Value Ref Range Status  09/20/2021 138 135 - 146 mmol/L Final  12/30/2016 140 134 - 144 mmol/L Final         Passed - Valid encounter within last 6 months    Recent Outpatient Visits          1 week ago Essential (primary) hypertension   [facility] [place], Salvadore Oxford, NP   1 month ago Encounter for general adult medical examination with abnormal findings   [facility] [place], Salvadore Oxford, NP   2 months ago Essential (primary) hypertension   [facility] [place], Jackelyn Poling, RPH-CPP   7 months ago Prediabetes   [facility] [place], Salvadore Oxford, NP      Future Appointments            Tomorrow [facility], Salvadore Oxford, NP [facility]

## 2021-10-25 ENCOUNTER — Encounter: Payer: Self-pay | Admitting: Internal Medicine

## 2021-10-25 ENCOUNTER — Ambulatory Visit: Payer: 59 | Admitting: Internal Medicine

## 2021-10-25 DIAGNOSIS — I10 Essential (primary) hypertension: Secondary | ICD-10-CM

## 2021-10-25 MED ORDER — HYDRALAZINE HCL 25 MG PO TABS: 25.0000 mg | ORAL_TABLET | Freq: Two times a day (BID) | ORAL | 0 refills | Status: DC

## 2021-10-25 NOTE — Assessment & Plan Note (Signed)
Remains uncontrolled despite losartan HCT I wanted to add labetalol 100 mg 3 times daily but he refuses to take a medication 3 times daily Rx for hydralazine 25 mg added to regimen Reinforced DASH diet and exercise for weight loss

## 2021-10-25 NOTE — Assessment & Plan Note (Signed)
Encourage diet and exercise for weight loss 

## 2021-10-25 NOTE — Patient Instructions (Signed)

## 2021-10-25 NOTE — Progress Notes (Signed)
Subjective:    Patient ID: Brad Mcgee, male    DOB: 01-12-1986, 36 y.o.   MRN: [id number]  HPI  Patient presents to clinic today for 2-week follow-up of HTN.  At his last visit, his BP remained elevated but he did not take his medication prior to his visit.  He was advised to take his medication as prescribed.  He is taking Losartan HCT.  His BP today is 176/132.  ECG from 07/2015 reviewed.  Review of Systems     Past Medical History:  Diagnosis Date   CVA (cerebral vascular accident) (HCC)    DVT (deep venous thrombosis) (HCC)    Fracture of left ankle    treated with cast   Hypertension     Current Outpatient Medications  Medication Sig Dispense Refill   aspirin 81 MG tablet Take 81 mg by mouth daily.     gabapentin (NEURONTIN) 100 MG capsule Take 1 capsule (100 mg total) by mouth at bedtime. 90 capsule 1   losartan-hydrochlorothiazide (HYZAAR) 100-12.5 MG tablet Take 1 tablet by mouth daily. 90 tablet 1   No current facility-administered medications for this visit.    Allergies  Allergen Reactions   Bee Venom Anaphylaxis   Coconut (Cocos Nucifera) Anaphylaxis   Ancef [Cefazolin] Rash    Family History  Problem Relation Age of Onset   Hypertension Mother    Breast cancer Maternal Grandmother    Hypertension Maternal Grandmother    Prostate cancer Maternal Grandfather    Hypertension Maternal Grandfather    Hypertension Paternal Grandfather    Diabetes Paternal Grandfather    Multiple sclerosis Paternal Grandmother     Social History   Socioeconomic History   Marital status: Married    Spouse name: Not on file   Number of children: Not on file   Years of education: Not on file   Highest education level: Not on file  Occupational History   Not on file  Tobacco Use   Smoking status: Never   Smokeless tobacco: Never  Vaping Use   Vaping Use: Never used  Substance and Sexual Activity   Alcohol use: No   Drug use: No   Sexual activity: Never  Other  Topics Concern   Not on file  Social History Narrative   Not on file   Social Determinants of Health   Financial Resource Strain: Not on file  Food Insecurity: Not on file  Transportation Needs: Not on file  Physical Activity: Not on file  Stress: Not on file  Social Connections: Not on file  Intimate Partner Violence: Not on file     Constitutional: Denies fever, malaise, fatigue, headache or abrupt weight changes.  Respiratory: Denies difficulty breathing, shortness of breath, cough or sputum production.   Cardiovascular: Denies chest pain, chest tightness, palpitations or swelling in the hands or feet.  Neurological: Denies dizziness, difficulty with memory, difficulty with speech or problems with balance and coordination.    No other specific complaints in a complete review of systems (except as listed in HPI above).  Objective:   Physical Exam  BP (!) 176/132 (BP Location: Right Arm, Patient Position: Sitting, Cuff Size: Large)   Pulse 80   Temp (!) 96.9 F (36.1 C) (Temporal)   Wt (!) 309 lb (140.2 kg)   SpO2 99%   BMI 47.68 kg/m   Wt Readings from Last 3 Encounters:  10/11/21 (!) 306 lb (138.8 kg)  09/20/21 298 lb (135.2 kg)  03/22/21 (!) 309 lb  9.6 oz (140.4 kg)    General: Appears his stated age, obese, in NAD. Cardiovascular: Normal rate and rhythm. S1,S2 noted.  No murmur, rubs or gallops noted. No JVD or BLE edema.  Pulmonary/Chest: Normal effort and positive vesicular breath sounds. No respiratory distress. No wheezes, rales or ronchi noted.  Neurological: Alert and oriented.   BMET    Component Value Date/Time   NA 138 09/20/2021 0819   NA 140 12/30/2016 1001   K 4.0 09/20/2021 0819   CL 103 09/20/2021 0819   CO2 26 09/20/2021 0819   GLUCOSE 105 (H) 09/20/2021 0819   BUN 12 09/20/2021 0819   BUN 17 12/30/2016 1001   CREATININE 0.95 09/20/2021 0819   CALCIUM 9.3 09/20/2021 0819   GFRNONAA 86 12/30/2016 1001   GFRAA 99 12/30/2016 1001     Lipid Panel     Component Value Date/Time   CHOL 143 09/20/2021 0819   CHOL 166 12/30/2016 1001   TRIG 132 09/20/2021 0819   HDL 23 (L) 09/20/2021 0819   HDL 31 (L) 12/30/2016 1001   CHOLHDL 6.2 (H) 09/20/2021 0819   VLDL 24.2 05/13/2019 1625   LDLCALC 97 09/20/2021 0819    CBC    Component Value Date/Time   WBC 4.0 09/20/2021 0819   RBC 4.58 09/20/2021 0819   HGB 15.0 09/20/2021 0819   HGB 15.4 12/30/2016 1001   HCT 44.2 09/20/2021 0819   HCT 44.2 12/30/2016 1001   PLT 218 09/20/2021 0819   PLT 216 12/30/2016 1001   MCV 96.5 09/20/2021 0819   MCV 93 12/30/2016 1001   MCH 32.8 09/20/2021 0819   MCHC 33.9 09/20/2021 0819   RDW 12.6 09/20/2021 0819   RDW 13.5 12/30/2016 1001   LYMPHSABS 1.9 09/28/2015 1434   MONOABS 0.4 09/28/2015 1434   EOSABS 0.1 09/28/2015 1434   BASOSABS 0.0 09/28/2015 1434    Hgb A1C Lab Results  Component Value Date   HGBA1C 6.1 (H) 09/20/2021           Assessment & Plan:   RTC in 2 weeks, nurse visit BP check, 5 months for follow-up of chronic conditions Nicki Reaper, NP

## 2021-11-08 ENCOUNTER — Ambulatory Visit: Payer: 59

## 2021-11-08 NOTE — Progress Notes (Signed)
Hypertension, follow-up  BP Readings from Last 3 Encounters:  10/25/21 (!) 176/132  10/11/21 (!) 156/112  09/20/21 (!) 146/94   Wt Readings from Last 3 Encounters:  10/25/21 (!) 309 lb (140.2 kg)  10/11/21 (!) 306 lb (138.8 kg)  09/20/21 298 lb (135.2 kg)     He was last seen for hypertension 10/25/2021. BP at that visit was 176/132.  Management since that visit includes adding hydralazine.  He reports excellent compliance with treatment. He is not having side effects.    180/114 right arm ---------------------------------------------------------------------------------------------------

## 2021-11-09 NOTE — Progress Notes (Signed)
Blood pressure remains uncontrolled.  I would like to refer him to cardiology for further evaluation and treatment.  Please let me know if he is agreeable with this.

## 2022-03-27 ENCOUNTER — Other Ambulatory Visit: Payer: Self-pay | Admitting: Internal Medicine

## 2022-03-27 NOTE — Telephone Encounter (Signed)
Requested Prescriptions  Pending Prescriptions Disp Refills   hydrALAZINE (APRESOLINE) 25 MG tablet [Pharmacy Med Name: HYDRALAZINE HCL 25 MG TAB] 180 tablet 0    Sig: TAKE (1) TABLET BY MOUTH TWICE DAILY     Cardiovascular:  Vasodilators Failed - 03/27/2022 10:23 AM      Failed - ANA Screen, Ifa, Serum in normal range and within 360 days    No results found for: "ANA", "ANATITER", "LABANTI"       Failed - Last BP in normal range    BP Readings from Last 1 Encounters:  11/08/21 (!) 180/114         Passed - HCT in normal range and within 360 days    HCT  Date Value Ref Range Status  09/20/2021 44.2 38.5 - 50.0 % Final   Hematocrit  Date Value Ref Range Status  12/30/2016 44.2 37.5 - 51.0 % Final         Passed - HGB in normal range and within 360 days    Hemoglobin  Date Value Ref Range Status  09/20/2021 15.0 13.2 - 17.1 g/dL Final  12/30/2016 15.4 13.0 - 17.7 g/dL Final         Passed - RBC in normal range and within 360 days    RBC  Date Value Ref Range Status  09/20/2021 4.58 4.20 - 5.80 Million/uL Final         Passed - WBC in normal range and within 360 days    WBC  Date Value Ref Range Status  09/20/2021 4.0 3.8 - 10.8 Thousand/uL Final         Passed - PLT in normal range and within 360 days    Platelets  Date Value Ref Range Status  09/20/2021 218 140 - 400 Thousand/uL Final  12/30/2016 216 150 - 379 x10E3/uL Final         Passed - Valid encounter within last 12 months    Recent Outpatient Visits           5 months ago Essential (primary) hypertension   [place], Coralie Keens, NP   5 months ago Essential (primary) hypertension   [place], Coralie Keens, NP   6 months ago Encounter for general adult medical examination with abnormal findings   [facility] [place], Coralie Keens, NP   8 months ago Essential (primary) hypertension   [place], Virl Diamond, RPH-CPP   1 year ago  Prediabetes   [facility] [place], Coralie Keens, NP

## 2022-05-06 ENCOUNTER — Encounter: Payer: Self-pay | Admitting: Internal Medicine

## 2022-05-30 ENCOUNTER — Ambulatory Visit (INDEPENDENT_AMBULATORY_CARE_PROVIDER_SITE_OTHER): Payer: 59 | Admitting: Internal Medicine

## 2022-05-30 ENCOUNTER — Encounter: Payer: Self-pay | Admitting: Internal Medicine

## 2022-05-30 VITALS — BP 210/130 | HR 85 | Temp 96.8°F | Wt 309.0 lb

## 2022-05-30 DIAGNOSIS — I69359 Hemiplegia and hemiparesis following cerebral infarction affecting unspecified side: Secondary | ICD-10-CM

## 2022-05-30 DIAGNOSIS — I5032 Chronic diastolic (congestive) heart failure: Secondary | ICD-10-CM

## 2022-05-30 DIAGNOSIS — R7303 Prediabetes: Secondary | ICD-10-CM

## 2022-05-30 DIAGNOSIS — G8194 Hemiplegia, unspecified affecting left nondominant side: Secondary | ICD-10-CM

## 2022-05-30 DIAGNOSIS — Z6841 Body Mass Index (BMI) 40.0 and over, adult: Secondary | ICD-10-CM

## 2022-05-30 DIAGNOSIS — G4733 Obstructive sleep apnea (adult) (pediatric): Secondary | ICD-10-CM

## 2022-05-30 DIAGNOSIS — I10 Essential (primary) hypertension: Secondary | ICD-10-CM | POA: Diagnosis not present

## 2022-05-30 MED ORDER — HYDRALAZINE HCL 25 MG PO TABS: ORAL_TABLET | ORAL | 1 refills | Status: DC

## 2022-05-30 MED ORDER — LOSARTAN POTASSIUM-HCTZ 100-12.5 MG PO TABS: 1.0000 | ORAL_TABLET | Freq: Every day | ORAL | 1 refills | Status: DC

## 2022-05-30 NOTE — Assessment & Plan Note (Signed)
Uncontrolled but noncompliant with medications Continue lisinopril HCT I tried to increase hydralazine to 3 times daily and add labetalol 100 mg 3 times daily however he declines Discussed his risk for immediate heart attack, stroke and possible death and his response was "it is what it is"

## 2022-05-30 NOTE — Assessment & Plan Note (Signed)
Noncompliant with CPAP Encouraged diet and exercise for weight loss as this can help reduce sleep apnea symptoms

## 2022-05-30 NOTE — Assessment & Plan Note (Signed)
Improved We will monitor

## 2022-05-30 NOTE — Assessment & Plan Note (Signed)
Encourage diet and exercise as for weight loss

## 2022-05-30 NOTE — Progress Notes (Signed)
Subjective:    Patient ID: Brad Mcgee, male    DOB: November 03, 1985, 37 y.o.   MRN: [id number]  HPI  Patient presents to clinic today for follow-up of chronic conditions.  HTN: His BP today is 218/142.  He is not taking Losartan HCT and Hydralazine as prescribed.  ECG from 07/2015 reviewed.  CHF: He denies lower extremity edema, shortness of breath or swelling in his legs.  He is   not taking HCTZ as prescribed.  Echo from 07/2015 reviewed.  HLD with History of Stroke with Left Hemiparesis: Hemorrhagic.  His last LDL was 97, triglycerides 132, 09/2021.  He is not taking any cholesterol-lowering medication at this time.  He does not consume a low-fat diet.  He does not follow with neurology.  Prediabetes: His last A1c was 6.1%, 09/2021.  He is not taking any oral diabetic medication this time.  He does not check his sugars.  OSA: He averages 7 hours of sleep per night without the use of his CPAP.  Sleep study from 10/2015 reviewed.      OA: Bilateral s/p fractures. He is taking Tylenol OTC with minimal relief of symptoms. He would like to try Meloxicam.   Review of Systems     Past Medical History:  Diagnosis Date   CVA (cerebral vascular accident) ([place])    DVT (deep venous thrombosis) ([place])    Fracture of left ankle    treated with cast   Hypertension     Current Outpatient Medications  Medication Sig Dispense Refill   aspirin 81 MG tablet Take 81 mg by mouth daily.     gabapentin (NEURONTIN) 100 MG capsule Take 1 capsule (100 mg total) by mouth at bedtime. 90 capsule 1   hydrALAZINE (APRESOLINE) 25 MG tablet TAKE (1) TABLET BY MOUTH TWICE DAILY 180 tablet 0   losartan-hydrochlorothiazide (HYZAAR) 100-12.5 MG tablet Take 1 tablet by mouth daily. 90 tablet 1   No current facility-administered medications for this visit.    Allergies  Allergen Reactions   Bee Venom Anaphylaxis   Coconut (Cocos Nucifera) Anaphylaxis   Ancef [Cefazolin] Rash    Family History  Problem  Relation Age of Onset   Hypertension Mother    Breast cancer Maternal Grandmother    Hypertension Maternal Grandmother    Prostate cancer Maternal Grandfather    Hypertension Maternal Grandfather    Hypertension Paternal Grandfather    Diabetes Paternal Grandfather    Multiple sclerosis Paternal Grandmother     Social History   Socioeconomic History   Marital status: Married    Spouse name: Not on file   Number of children: Not on file   Years of education: Not on file   Highest education level: Not on file  Occupational History   Not on file  Tobacco Use   Smoking status: Never   Smokeless tobacco: Never  Vaping Use   Vaping Use: Never used  Substance and Sexual Activity   Alcohol use: No   Drug use: No   Sexual activity: Never  Other Topics Concern   Not on file  Social History Narrative   Not on file   Social Determinants of Health   Financial Resource Strain: Not on file  Food Insecurity: Not on file  Transportation Needs: Not on file  Physical Activity: Not on file  Stress: Not on file  Social Connections: Not on file  Intimate Partner Violence: Not on file     Constitutional: Denies fever, malaise, fatigue, headache or  abrupt weight changes.  HEENT: Denies eye pain, eye redness, ear pain, ringing in the ears, wax buildup, runny nose, nasal congestion, bloody nose, or sore throat. Respiratory: Denies difficulty breathing, shortness of breath, cough or sputum production.   Cardiovascular: Denies chest pain, chest tightness, palpitations or swelling in the hands or feet.  Gastrointestinal: Denies abdominal pain, bloating, constipation, diarrhea or blood in the stool.  GU: Denies urgency, frequency, pain with urination, burning sensation, blood in urine, odor or discharge. Musculoskeletal: Pt reports bilateral joint pain. Denies decrease in range of motion, difficulty with gait, muscle pain or joint swelling.  Skin: Denies redness, rashes, lesions or  ulcercations.  Neurological: Denies dizziness, difficulty with memory, difficulty with speech or problems with balance and coordination.  Psych: Denies anxiety, depression, SI/HI.  No other specific complaints in a complete review of systems (except as listed in HPI above).  Objective:   Physical Exam  BP (!) 210/130 (BP Location: Right Arm, Patient Position: Sitting, Cuff Size: Large)   Pulse 85   Temp (!) 96.8 F (36 C) (Temporal)   Wt (!) 309 lb (140.2 kg)   SpO2 98%   BMI 47.68 kg/m   Wt Readings from Last 3 Encounters:  10/25/21 (!) 309 lb (140.2 kg)  10/11/21 (!) 306 lb (138.8 kg)  09/20/21 298 lb (135.2 kg)    General: Appears his stated age, obese, in NAD. Skin: Warm, dry and intact.  HEENT: Head: normal shape and size; Eyes: sclera white, no icterus, conjunctiva pink, PERRLA and EOMs intact;  Cardiovascular: Normal rate and rhythm. S1,S2 noted.  No murmur, rubs or gallops noted. No JVD or BLE edema.  Pulmonary/Chest: Normal effort and positive vesicular breath sounds. No respiratory distress. No wheezes, rales or ronchi noted.  Musculoskeletal: No signs of joint swelling. No difficulty with gait.  Neurological: Alert and oriented. Coordination normal.  Psychiatric: Mood and affect normal. Behavior is normal. Judgment and thought content normal.     BMET    Component Value Date/Time   NA 138 09/20/2021 0819   NA 140 12/30/2016 1001   K 4.0 09/20/2021 0819   CL 103 09/20/2021 0819   CO2 26 09/20/2021 0819   GLUCOSE 105 (H) 09/20/2021 0819   BUN 12 09/20/2021 0819   BUN 17 12/30/2016 1001   CREATININE 0.95 09/20/2021 0819   CALCIUM 9.3 09/20/2021 0819   GFRNONAA 86 12/30/2016 1001   GFRAA 99 12/30/2016 1001    Lipid Panel     Component Value Date/Time   CHOL 143 09/20/2021 0819   CHOL 166 12/30/2016 1001   TRIG 132 09/20/2021 0819   HDL 23 (L) 09/20/2021 0819   HDL 31 (L) 12/30/2016 1001   CHOLHDL 6.2 (H) 09/20/2021 0819   VLDL 24.2 05/13/2019 1625    LDLCALC 97 09/20/2021 0819    CBC    Component Value Date/Time   WBC 4.0 09/20/2021 0819   RBC 4.58 09/20/2021 0819   HGB 15.0 09/20/2021 0819   HGB 15.4 12/30/2016 1001   HCT 44.2 09/20/2021 0819   HCT 44.2 12/30/2016 1001   PLT 218 09/20/2021 0819   PLT 216 12/30/2016 1001   MCV 96.5 09/20/2021 0819   MCV 93 12/30/2016 1001   MCH 32.8 09/20/2021 0819   MCHC 33.9 09/20/2021 0819   RDW 12.6 09/20/2021 0819   RDW 13.5 12/30/2016 1001   LYMPHSABS 1.9 09/28/2015 1434   MONOABS 0.4 09/28/2015 1434   EOSABS 0.1 09/28/2015 1434   BASOSABS 0.0 09/28/2015 1434  Hgb A1C Lab Results  Component Value Date   HGBA1C 6.1 (H) 09/20/2021            Assessment & Plan:    RTC in 6 months for annual exam [name], NP

## 2022-05-30 NOTE — Assessment & Plan Note (Signed)
A1c today Encourage low-carb diet and exercise for weight loss 

## 2022-05-30 NOTE — Assessment & Plan Note (Signed)
Discussed the importance of good blood pressure, cholesterol and glucose control He will not allow me to adjust his blood pressure medication He reports he is taking aspirin daily, advised him to stop this

## 2022-05-30 NOTE — Assessment & Plan Note (Signed)
Compensated Encouraged daily weights Reinforced DASH diet and exercise for weight loss Advised him to take HCTZ as prescribed

## 2022-05-30 NOTE — Patient Instructions (Signed)

## 2022-05-31 LAB — LIPID PANEL
Cholesterol: 204 mg/dL — ABNORMAL HIGH (ref <200)
HDL: 42 mg/dL (ref >=40)
LDL Cholesterol (Calc): 135 mg/dL (calc) — ABNORMAL HIGH
Non-HDL Cholesterol (Calc): 162 mg/dL (calc) — ABNORMAL HIGH (ref <130)
Total CHOL/HDL Ratio: 4.9 (calc) (ref <5.0)
Triglycerides: 148 mg/dL (ref <150)

## 2022-05-31 LAB — COMPLETE METABOLIC PANEL WITH GFR
AG Ratio: 1.5 (calc) (ref 1.0–2.5)
ALT: 19 U/L (ref 9–46)
AST: 16 U/L (ref 10–40)
Albumin: 4.3 g/dL (ref 3.6–5.1)
Alkaline phosphatase (APISO): 63 U/L (ref 36–130)
BUN: 16 mg/dL (ref 7–25)
CO2: 23 mmol/L (ref 20–32)
Calcium: 9.9 mg/dL (ref 8.6–10.3)
Chloride: 104 mmol/L (ref 98–110)
Creat: 1.11 mg/dL (ref 0.60–1.26)
Globulin: 2.8 g/dL (calc) (ref 1.9–3.7)
Glucose, Bld: 127 mg/dL (ref 65–139)
Potassium: 4.1 mmol/L (ref 3.5–5.3)
Sodium: 137 mmol/L (ref 135–146)
Total Bilirubin: 0.4 mg/dL (ref 0.2–1.2)
Total Protein: 7.1 g/dL (ref 6.1–8.1)
eGFR: 88 mL/min/{1.73_m2} (ref >=60)

## 2022-05-31 LAB — HEMOGLOBIN A1C
Hgb A1c MFr Bld: 6.6 % of total Hgb — ABNORMAL HIGH (ref <5.7)
Mean Plasma Glucose: 143 mg/dL
eAG (mmol/L): 7.9 mmol/L

## 2022-06-01 ENCOUNTER — Encounter: Payer: Self-pay | Admitting: Internal Medicine

## 2022-06-04 ENCOUNTER — Encounter: Payer: Self-pay | Admitting: Internal Medicine

## 2022-06-05 NOTE — Telephone Encounter (Signed)
The patient called back again in reference to this call and said he still hasn't heard back from anyone at the office. Please assist patient as soon as possible

## 2022-06-06 ENCOUNTER — Encounter: Payer: Self-pay | Admitting: Internal Medicine

## 2022-06-06 ENCOUNTER — Telehealth (INDEPENDENT_AMBULATORY_CARE_PROVIDER_SITE_OTHER): Payer: 59 | Admitting: Internal Medicine

## 2022-06-06 DIAGNOSIS — Z6841 Body Mass Index (BMI) 40.0 and over, adult: Secondary | ICD-10-CM

## 2022-06-06 DIAGNOSIS — E1165 Type 2 diabetes mellitus with hyperglycemia: Secondary | ICD-10-CM

## 2022-06-06 MED ORDER — GABAPENTIN 100 MG PO CAPS: 100.0000 mg | ORAL_CAPSULE | Freq: Every day | ORAL | 1 refills | Status: DC

## 2022-06-06 NOTE — Progress Notes (Signed)
Virtual Visit via Video Note  I connected with Brad Mcgee on 06/06/22 at  4:00 PM EDT by a video enabled telemedicine application and verified that I am speaking with the correct person using two identifiers.  Location: Patient: Home Provider: Office  Person's participating in this video call: [name], NP and Ethlyn Daniels.   I discussed the limitations of evaluation and management by telemedicine and the availability of in person appointments. The patient expressed understanding and agreed to proceed.  History of Present Illness:  Pt due for follow up of recent labs. His recent A1C was 6/6% indicating a diagnosis of new onset diabetes. His last LDL was 134, triglycerides 148. He is not currently taking any oral diabetic or cholesterol lowering medication at this time.    Past Medical History:  Diagnosis Date   CVA (cerebral vascular accident) ([facility])    DVT (deep venous thrombosis) ([facility])    Fracture of left ankle    treated with cast   Hypertension     Current Outpatient Medications  Medication Sig Dispense Refill   gabapentin (NEURONTIN) 100 MG capsule Take 1 capsule (100 mg total) by mouth at bedtime. 90 capsule 1   hydrALAZINE (APRESOLINE) 25 MG tablet TAKE (1) TABLET BY MOUTH TWICE DAILY 180 tablet 1   losartan-hydrochlorothiazide (HYZAAR) 100-12.5 MG tablet Take 1 tablet by mouth daily. 90 tablet 1   No current facility-administered medications for this visit.    Allergies  Allergen Reactions   Bee Venom Anaphylaxis   Coconut (Cocos Nucifera) Anaphylaxis   Ancef [Cefazolin] Rash    Family History  Problem Relation Age of Onset   Hypertension Mother    Breast cancer Maternal Grandmother    Hypertension Maternal Grandmother    Prostate cancer Maternal Grandfather    Hypertension Maternal Grandfather    Hypertension Paternal Grandfather    Diabetes Paternal Grandfather    Multiple sclerosis Paternal Grandmother     Social History   Socioeconomic  History   Marital status: Married    Spouse name: Not on file   Number of children: Not on file   Years of education: Not on file   Highest education level: Not on file  Occupational History   Not on file  Tobacco Use   Smoking status: Never   Smokeless tobacco: Never  Vaping Use   Vaping Use: Never used  Substance and Sexual Activity   Alcohol use: No   Drug use: No   Sexual activity: Never  Other Topics Concern   Not on file  Social History Narrative   Not on file   Social Determinants of Health   Financial Resource Strain: Not on file  Food Insecurity: Not on file  Transportation Needs: Not on file  Physical Activity: Not on file  Stress: Not on file  Social Connections: Not on file  Intimate Partner Violence: Not on file     Constitutional: Denies fever, malaise, fatigue, headache or abrupt weight changes.  HEENT: Denies eye pain, eye redness, ear pain, ringing in the ears, wax buildup, runny nose, nasal congestion, bloody nose, or sore throat. Respiratory: Denies difficulty breathing, shortness of breath, cough or sputum production.   Cardiovascular: Denies chest pain, chest tightness, palpitations or swelling in the hands or feet.  Gastrointestinal: Denies abdominal pain, bloating, constipation, diarrhea or blood in the stool.  GU: Pt reports Denies urgency, frequency, pain with urination, burning sensation, blood in urine, odor or discharge. Musculoskeletal: Denies decrease in range of motion, difficulty with  gait, muscle pain or joint pain and swelling.  Skin: Denies redness, rashes, lesions or ulcercations.  Neurological: Denies dizziness, difficulty with memory, difficulty with speech or problems with balance and coordination.  Psych: Denies anxiety, depression, SI/HI.  No other specific complaints in a complete review of systems (except as listed in HPI above).    Observations/Objective:   Wt Readings from Last 3 Encounters:  05/30/22 (!) 309 lb (140.2  kg)  10/25/21 (!) 309 lb (140.2 kg)  10/11/21 (!) 306 lb (138.8 kg)    General: Appears his  stated age, obese, in NAD. Pulmonary/Chest: Normal effort. No respiratory distress.  Neurological: Alert and oriented.   BMET    Component Value Date/Time   NA 137 05/30/2022 0846   NA 140 12/30/2016 1001   K 4.1 05/30/2022 0846   CL 104 05/30/2022 0846   CO2 23 05/30/2022 0846   GLUCOSE 127 05/30/2022 0846   BUN 16 05/30/2022 0846   BUN 17 12/30/2016 1001   CREATININE 1.11 05/30/2022 0846   CALCIUM 9.9 05/30/2022 0846   GFRNONAA 86 12/30/2016 1001   GFRAA 99 12/30/2016 1001    Lipid Panel     Component Value Date/Time   CHOL 204 (H) 05/30/2022 0846   CHOL 166 12/30/2016 1001   TRIG 148 05/30/2022 0846   HDL 42 05/30/2022 0846   HDL 31 (L) 12/30/2016 1001   CHOLHDL 4.9 05/30/2022 0846   VLDL 24.2 05/13/2019 1625   LDLCALC 135 (H) 05/30/2022 0846    CBC    Component Value Date/Time   WBC 4.0 09/20/2021 0819   RBC 4.58 09/20/2021 0819   HGB 15.0 09/20/2021 0819   HGB 15.4 12/30/2016 1001   HCT 44.2 09/20/2021 0819   HCT 44.2 12/30/2016 1001   PLT 218 09/20/2021 0819   PLT 216 12/30/2016 1001   MCV 96.5 09/20/2021 0819   MCV 93 12/30/2016 1001   MCH 32.8 09/20/2021 0819   MCHC 33.9 09/20/2021 0819   RDW 12.6 09/20/2021 0819   RDW 13.5 12/30/2016 1001   LYMPHSABS 1.9 09/28/2015 1434   MONOABS 0.4 09/28/2015 1434   EOSABS 0.1 09/28/2015 1434   BASOSABS 0.0 09/28/2015 1434    Hgb A1C Lab Results  Component Value Date   HGBA1C 6.6 (H) 05/30/2022       Assessment and Plan:  RTC in 3 months, follow up chronic conditions  Follow Up Instructions:    I discussed the assessment and treatment plan with the patient. The patient was provided an opportunity to ask questions and all were answered. The patient agreed with the plan and demonstrated an understanding of the instructions.   The patient was advised to call back or seek an in-person evaluation if the  symptoms worsen or if the condition fails to improve as anticipated.   [name], NP

## 2022-06-06 NOTE — Assessment & Plan Note (Signed)
Encourage diet and exercise for weight loss 

## 2022-06-06 NOTE — Assessment & Plan Note (Signed)
Discussed diabetes and standards of medical care Will check urine microalbumin at his next visit Will discuss statin therapy at his next visit He declines referral for diabetes education and nutrition at this time Encourage low-carb diet and exercise for weight loss Encourage routine eye exam Encouraged routine foot exam Encouraged him to get a flu shot in the fall Will discuss Pneumovax at his next visit Encouraged him to get his COVID booster

## 2022-06-06 NOTE — Patient Instructions (Signed)

## 2022-06-06 NOTE — Addendum Note (Signed)
Addended by: Jearld Fenton on: 06/06/2022 11:30 AM   Modules accepted: Orders

## 2022-09-02 ENCOUNTER — Ambulatory Visit: Payer: 59 | Admitting: Internal Medicine

## 2022-09-02 NOTE — Progress Notes (Deleted)
Subjective:    Patient ID: Brad Mcgee, male    DOB: Sep 30, 1985, 37 y.o.   MRN: [id number]  HPI  Patient presents to clinic today for 34-month follow-up of chronic conditions.  HTN: His BP today is.  He is noncompliant with losartan HCT and hydralazine.  ECG from 07/2015 reviewed.  CHF: He denies lower extremity edema, shortness of breath or swelling in his legs.  He is noncompliant with HCTZ.  Echo from 07/2015 reviewed.  HLD with History of Stroke with Left Hemiparesis: Hemorrhagic.  His last LDL was 135, triglycerides 148, 05/2022.  He is not taking any cholesterol-lowering medication at this time.  He does not consume low-fat diet.  He does not follow with neurology.  DM2: His last A1c was 6.6%, 05/2022.  He is not taking any oral diabetic medication at this time.  He is not checking his sugars.  He does not check his feet routinely.  His last eye exam was >1-year ago.  Flu never.  Pneumovax never.  COVID never.  OSA: He averages 7 hours of sleep per night without the use of CPAP.  Sleep study from 10/2015 reviewed.  OA: Mainly in his.  He takes gabapentin as prescribed.  He does not follow with orthopedics.  Review of Systems     Past Medical History:  Diagnosis Date   CVA (cerebral vascular accident) ([facility])    DVT (deep venous thrombosis) ([facility])    Fracture of left ankle    treated with cast   Hypertension     Current Outpatient Medications  Medication Sig Dispense Refill   gabapentin (NEURONTIN) 100 MG capsule Take 1 capsule (100 mg total) by mouth at bedtime. 90 capsule 1   hydrALAZINE (APRESOLINE) 25 MG tablet TAKE (1) TABLET BY MOUTH TWICE DAILY 180 tablet 1   losartan-hydrochlorothiazide (HYZAAR) 100-12.5 MG tablet Take 1 tablet by mouth daily. 90 tablet 1   No current facility-administered medications for this visit.    Allergies  Allergen Reactions   Bee Venom Anaphylaxis   Coconut (Cocos Nucifera) Anaphylaxis   Ancef [Cefazolin] Rash    Family History   Problem Relation Age of Onset   Hypertension Mother    Breast cancer Maternal Grandmother    Hypertension Maternal Grandmother    Prostate cancer Maternal Grandfather    Hypertension Maternal Grandfather    Hypertension Paternal Grandfather    Diabetes Paternal Grandfather    Multiple sclerosis Paternal Grandmother     Social History   Socioeconomic History   Marital status: Married    Spouse name: Not on file   Number of children: Not on file   Years of education: Not on file   Highest education level: Not on file  Occupational History   Not on file  Tobacco Use   Smoking status: Never   Smokeless tobacco: Never  Vaping Use   Vaping Use: Never used  Substance and Sexual Activity   Alcohol use: No   Drug use: No   Sexual activity: Never  Other Topics Concern   Not on file  Social History Narrative   Not on file   Social Determinants of Health   Financial Resource Strain: Not on file  Food Insecurity: Not on file  Transportation Needs: Not on file  Physical Activity: Not on file  Stress: Not on file  Social Connections: Not on file  Intimate Partner Violence: Not on file     Constitutional: Denies fever, malaise, fatigue, headache or abrupt weight changes.  HEENT: Denies eye pain, eye redness, ear pain, ringing in the ears, wax buildup, runny nose, nasal congestion, bloody nose, or sore throat. Respiratory: Denies difficulty breathing, shortness of breath, cough or sputum production.   Cardiovascular: Denies chest pain, chest tightness, palpitations or swelling in the hands or feet.  Gastrointestinal: Denies abdominal pain, bloating, constipation, diarrhea or blood in the stool.  GU: Denies urgency, frequency, pain with urination, burning sensation, blood in urine, odor or discharge. Musculoskeletal: Patient reports joint pain.  Denies decrease in range of motion, difficulty with gait, muscle pain or joint swelling.  Skin: Denies redness, rashes, lesions or  ulcercations.  Neurological: Denies dizziness, difficulty with memory, difficulty with speech or problems with balance and coordination.  Psych: Denies anxiety, depression, SI/HI.  No other specific complaints in a complete review of systems (except as listed in HPI above).  Objective:   Physical Exam   There were no vitals taken for this visit. Wt Readings from Last 3 Encounters:  05/30/22 (!) 309 lb (140.2 kg)  10/25/21 (!) 309 lb (140.2 kg)  10/11/21 (!) 306 lb (138.8 kg)    General: Appears their stated age, well developed, well nourished in NAD. Skin: Warm, dry and intact. No rashes, lesions or ulcerations noted. HEENT: Head: normal shape and size; Eyes: sclera white, no icterus, conjunctiva pink, PERRLA and EOMs intact; Ears: Tm's gray and intact, normal light reflex; Nose: mucosa pink and moist, septum midline; Throat/Mouth: Teeth present, mucosa pink and moist, no exudate, lesions or ulcerations noted.  Neck:  Neck supple, trachea midline. No masses, lumps or thyromegaly present.  Cardiovascular: Normal rate and rhythm. S1,S2 noted.  No murmur, rubs or gallops noted. No JVD or BLE edema. No carotid bruits noted. Pulmonary/Chest: Normal effort and positive vesicular breath sounds. No respiratory distress. No wheezes, rales or ronchi noted.  Abdomen: Soft and nontender. Normal bowel sounds. No distention or masses noted. Liver, spleen and kidneys non palpable. Musculoskeletal: Normal range of motion. No signs of joint swelling. No difficulty with gait.  Neurological: Alert and oriented. Cranial nerves II-XII grossly intact. Coordination normal.  Psychiatric: Mood and affect normal. Behavior is normal. Judgment and thought content normal.    BMET    Component Value Date/Time   NA 137 05/30/2022 0846   NA 140 12/30/2016 1001   K 4.1 05/30/2022 0846   CL 104 05/30/2022 0846   CO2 23 05/30/2022 0846   GLUCOSE 127 05/30/2022 0846   BUN 16 05/30/2022 0846   BUN 17 12/30/2016  1001   CREATININE 1.11 05/30/2022 0846   CALCIUM 9.9 05/30/2022 0846   GFRNONAA 86 12/30/2016 1001   GFRAA 99 12/30/2016 1001    Lipid Panel     Component Value Date/Time   CHOL 204 (H) 05/30/2022 0846   CHOL 166 12/30/2016 1001   TRIG 148 05/30/2022 0846   HDL 42 05/30/2022 0846   HDL 31 (L) 12/30/2016 1001   CHOLHDL 4.9 05/30/2022 0846   VLDL 24.2 05/13/2019 1625   LDLCALC 135 (H) 05/30/2022 0846    CBC    Component Value Date/Time   WBC 4.0 09/20/2021 0819   RBC 4.58 09/20/2021 0819   HGB 15.0 09/20/2021 0819   HGB 15.4 12/30/2016 1001   HCT 44.2 09/20/2021 0819   HCT 44.2 12/30/2016 1001   PLT 218 09/20/2021 0819   PLT 216 12/30/2016 1001   MCV 96.5 09/20/2021 0819   MCV 93 12/30/2016 1001   MCH 32.8 09/20/2021 0819   MCHC 33.9 09/20/2021  0819   RDW 12.6 09/20/2021 0819   RDW 13.5 12/30/2016 1001   LYMPHSABS 1.9 09/28/2015 1434   MONOABS 0.4 09/28/2015 1434   EOSABS 0.1 09/28/2015 1434   BASOSABS 0.0 09/28/2015 1434    Hgb A1C Lab Results  Component Value Date   HGBA1C 6.6 (H) 05/30/2022            Assessment & Plan:      RTC in 3 months for annual exam Nicki Reaper, NP

## 2022-09-09 ENCOUNTER — Ambulatory Visit (INDEPENDENT_AMBULATORY_CARE_PROVIDER_SITE_OTHER): Payer: 59 | Admitting: Internal Medicine

## 2022-09-09 ENCOUNTER — Encounter: Payer: Self-pay | Admitting: Internal Medicine

## 2022-09-09 VITALS — BP 182/100 | HR 89 | Temp 97.5°F | Ht 67.5 in | Wt 302.2 lb

## 2022-09-09 DIAGNOSIS — I10 Essential (primary) hypertension: Secondary | ICD-10-CM

## 2022-09-09 DIAGNOSIS — I5032 Chronic diastolic (congestive) heart failure: Secondary | ICD-10-CM

## 2022-09-09 DIAGNOSIS — E1165 Type 2 diabetes mellitus with hyperglycemia: Secondary | ICD-10-CM

## 2022-09-09 DIAGNOSIS — G4733 Obstructive sleep apnea (adult) (pediatric): Secondary | ICD-10-CM

## 2022-09-09 DIAGNOSIS — M19172 Post-traumatic osteoarthritis, left ankle and foot: Secondary | ICD-10-CM

## 2022-09-09 DIAGNOSIS — I69359 Hemiplegia and hemiparesis following cerebral infarction affecting unspecified side: Secondary | ICD-10-CM

## 2022-09-09 DIAGNOSIS — M19171 Post-traumatic osteoarthritis, right ankle and foot: Secondary | ICD-10-CM

## 2022-09-09 DIAGNOSIS — M19079 Primary osteoarthritis, unspecified ankle and foot: Secondary | ICD-10-CM | POA: Insufficient documentation

## 2022-09-09 DIAGNOSIS — Z6841 Body Mass Index (BMI) 40.0 and over, adult: Secondary | ICD-10-CM

## 2022-09-09 DIAGNOSIS — G8194 Hemiplegia, unspecified affecting left nondominant side: Secondary | ICD-10-CM

## 2022-09-09 LAB — POCT GLYCOSYLATED HEMOGLOBIN (HGB A1C): HbA1c, POC (controlled diabetic range): 6.4 % (ref 0.0–7.0)

## 2022-09-09 MED ORDER — LABETALOL HCL 100 MG PO TABS: 100.0000 mg | ORAL_TABLET | Freq: Three times a day (TID) | ORAL | 0 refills | Status: DC

## 2022-09-09 MED ORDER — LOSARTAN POTASSIUM-HCTZ 100-12.5 MG PO TABS: 1.0000 | ORAL_TABLET | Freq: Every day | ORAL | 1 refills | Status: DC

## 2022-09-09 NOTE — Assessment & Plan Note (Signed)
Encourage weight loss as this can help reduce joint pain Continue gabapentin

## 2022-09-09 NOTE — Assessment & Plan Note (Signed)
Compensated Discussed the importance of better blood pressure control Continue losartan HCT and hydralazine Will add labetalol 100 mg 3 times daily Reinforced DASH diet and exercise for weight loss C-Met today

## 2022-09-09 NOTE — Assessment & Plan Note (Signed)
POCT A1c 6.4% We will check urine microalbumin Encourage low-carb diet and exercise for weight loss Not medicated Encourage routine diabetic eye exam Encouraged routine foot exam

## 2022-09-09 NOTE — Patient Instructions (Signed)
Cooking With Less Salt Cooking with less salt is one way to reduce the amount of salt (sodium) you get from food. Most people should have less than 2,300 milligrams (mg) of sodium each day. If you have high blood pressure (hypertension), you may need to limit your sodium to 1,500 mg each day. Follow the tips below to help reduce your sodium intake. What are tips for eating less sodium? Reading food labels  Check the food label before buying or using packaged ingredients. Always check the label for the serving size and sodium content. Choose products with less than 140 mg of sodium per serving. Check the % Daily Value column to see what percent of the daily recommended amount of sodium is in one serving of the product. Foods with 5% or less are low in sodium. Foods with 20% or more are high in sodium. Do not choose foods that have salt as one of the first three ingredients on the ingredients list. Always check how much sodium is in a product, even if the label says "unsalted" or "no salt added." Shopping Buy sodium-free or low-sodium products. Look for these words: Low-sodium. Sodium-free. Reduced-sodium. No salt added. Unsalted. Buy fresh or frozen foods without sauces or additives. Cooking Instead of salt, use herbs, seasonings without salt, and spices. Use sodium-free baking soda. Grill, braise, or roast foods to add flavor with less salt. Do not add salt to pasta, rice, or hot cereals. Drain and rinse canned vegetables, beans, and meat before use. Do not add salt when cooking sweets and desserts. Cook with low-sodium ingredients. Meal planning The sodium in bread can add up. Try to plan meals with other grains. These may include whole oats, quinoa, whole wheat pasta, and other whole grains that do not have sodium added to them. What foods are high in sodium? Vegetables Regular canned vegetables, except low-sodium or reduced-sodium items. Sauerkraut, pickled vegetables, and relishes.  Olives. French fries. Onion rings. Regular canned tomato sauce and paste. Regular tomato and vegetable juice. Frozen vegetables in sauces. Grains Instant hot cereals. Bread stuffing, pancake, and biscuit mixes. Croutons. Seasoned rice or pasta mixes. Noodle soup cups. Boxed or frozen macaroni and cheese. Regular salted crackers. Self-rising flour. Rolls. Bagels. Flour tortillas and wraps. Meats and other proteins Meat or fish that is salted, canned, smoked, cured, spiced, or pickled. Precooked or cured meat, such as sausages or meat loaves. Bacon. Ham. Pepperoni. Hot dogs. Corned beef. Chipped beef. Salt pork. Jerky. Pickled herring, anchovies, and sardines. Regular canned tuna. Salted nuts. Dairy Processed cheese and cheese spreads. Hard cheeses. Cheese curds. Blue cheese. Feta cheese. String cheese. Regular cottage cheese. Buttermilk. Canned milk. The items listed above may not be a full list of foods high in sodium. Talk to a dietitian to learn more. What foods are low in sodium? Fruits Fresh, frozen, or canned fruit with no sauce added. Fruit juice. Vegetables Fresh or frozen vegetables with no sauce added. "No salt added" canned vegetables. "No salt added" tomato sauce and paste. Low-sodium or reduced-sodium tomato and vegetable juice. Grains Noodles, pasta, quinoa, rice. Shredded or puffed wheat or puffed rice. Regular or quick oats (not instant). Low-sodium crackers. Low-sodium bread. Whole grain bread and whole grain pasta. Unsalted popcorn. Meats and other proteins Fresh or frozen whole meats, poultry that has not been injected with sodium, and fish with no sauce added. Unsalted nuts. Dried peas, beans, and lentils without added salt. Unsalted canned beans. Eggs. Unsalted nut butters. Low-sodium canned tuna or chicken. Dairy   Milk. Soy milk. Yogurt. Low-sodium cheeses, such as Swiss, Monterey Jack, mozzarella, and ricotta. Sherbet or ice cream (keep to  cup per serving). Cream  cheese. Fats and oils Unsalted butter or margarine. Other foods Homemade pudding. Sodium-free baking soda and baking powder. Herbs and spices. Low-sodium seasoning mixes. Beverages Coffee and tea. Carbonated beverages. The items listed above may not be a full list of foods low in sodium. Talk to a dietitian to learn more. What are some salt alternatives when cooking? Herbs, seasonings, and spices can be used instead of salt to flavor your food. Herbs should be fresh or dried. Do not choose packaged mixes. Next to the name of the herb, spice, or seasoning below are some foods you can pair it with. Herbs [place] leaves - Soups, meat and vegetable dishes, and spaghetti sauce. Basil - Italian dishes, soups, pasta, and fish dishes. Cilantro - Meat, poultry, and vegetable dishes. Chili powder - Marinades and Mexican dishes. Chives - Salad dressings and potato dishes. Cumin - Mexican dishes, couscous, and meat dishes. Dill - Fish dishes, sauces, and salads. Fennel - Meat and vegetable dishes, breads, and cookies. Garlic (do not use garlic salt) - Italian dishes, meat dishes, salad dressings, and sauces. Marjoram - Soups, potato dishes, and meat dishes. Oregano - Pizza and spaghetti sauce. Parsley - Salads, soups, pasta, and meat dishes. Rosemary - Italian dishes, salad dressings, soups, and red meats. Saffron - Fish dishes, pasta, and some poultry dishes. Sage - Stuffings and sauces. Tarragon - Fish and poultry dishes. Thyme - Stuffing, meat, and fish dishes. Seasonings Lemon juice - Fish dishes, poultry dishes, vegetables, and salads. Vinegar - Salad dressings, vegetables, and fish dishes. Spices Cinnamon - Sweet dishes, such as cakes, cookies, and puddings. Cloves - Gingerbread, puddings, and marinades for meats. Curry - Vegetable dishes, fish and poultry dishes, and stir-fry dishes. Ginger - Vegetable dishes, fish dishes, and stir-fry dishes. Nutmeg - Pasta, vegetables, poultry, fish  dishes, and custard. This information is not intended to replace advice given to you by your health care provider. Make sure you discuss any questions you have with your health care provider. Document Revised: 03/28/2022 Document Reviewed: 03/21/2022 Elsevier Patient Education  2024 [employer]  

## 2022-09-09 NOTE — Progress Notes (Signed)
Subjective:    Patient ID: Brad Mcgee, male    DOB: 1985-11-22, 37 y.o.   MRN: [id number]  HPI  Patient presents to clinic today for follow-up of chronic conditions.  HTN: His BP today is 185/98.  He is not taking losartan HCT and hydralazine as prescribed.  ECG from 07/2015 reviewed.  CHF: He denies lower extremity edema, shortness of breath or cough.  He is not taking HCTZ as prescribed.  Echo from 07/2015 reviewed.  HLD with history of stroke with left hemiparesis: Hemorrhagic.  His last LDL was 135, triglycerides 128, 05/2022.  He is not taking any cholesterol-lowering medication at this time.  He does not consume a low-fat diet.  He does not follow with neurology.  DM2: His last A1c was 6.6%, 05/2022.  He is not taking any oral diabetic medication at this time.  He does not check his sugars.  He does not check his feet routinely.  His last eye exam was 1 year ago.  Flu never.  Pneumovax never.  COVID never.  OSA: He averages 7 hours of sleep per night without the use of his CPAP.  Sleep study from 10/2015 reviewed.  OA: Mainly in his ankles.  He is taken gabapentin as prescribed with some relief of symptoms.  He does not follow with orthopedics.  Review of Systems     Past Medical History:  Diagnosis Date   CVA (cerebral vascular accident) (HCC)    DVT (deep venous thrombosis) (HCC)    Fracture of left ankle    treated with cast   Hypertension     Current Outpatient Medications  Medication Sig Dispense Refill   gabapentin (NEURONTIN) 100 MG capsule Take 1 capsule (100 mg total) by mouth at bedtime. 90 capsule 1   hydrALAZINE (APRESOLINE) 25 MG tablet TAKE (1) TABLET BY MOUTH TWICE DAILY 180 tablet 1   losartan-hydrochlorothiazide (HYZAAR) 100-12.5 MG tablet Take 1 tablet by mouth daily. 90 tablet 1   No current facility-administered medications for this visit.    Allergies  Allergen Reactions   Bee Venom Anaphylaxis   Coconut (Cocos Nucifera) Anaphylaxis   Ancef  [Cefazolin] Rash    Family History  Problem Relation Age of Onset   Hypertension Mother    Breast cancer Maternal Grandmother    Hypertension Maternal Grandmother    Prostate cancer Maternal Grandfather    Hypertension Maternal Grandfather    Hypertension Paternal Grandfather    Diabetes Paternal Grandfather    Multiple sclerosis Paternal Grandmother     Social History   Socioeconomic History   Marital status: Married    Spouse name: Not on file   Number of children: Not on file   Years of education: Not on file   Highest education level: 12th grade  Occupational History   Not on file  Tobacco Use   Smoking status: Never   Smokeless tobacco: Never  Vaping Use   Vaping Use: Never used  Substance and Sexual Activity   Alcohol use: No   Drug use: No   Sexual activity: Never  Other Topics Concern   Not on file  Social History Narrative   Not on file   Social Determinants of Health   Financial Resource Strain: Patient Declined (09/08/2022)   Overall Financial Resource Strain (CARDIA)    Difficulty of Paying Living Expenses: Patient declined  Food Insecurity: Patient Declined (09/08/2022)   Hunger Vital Sign    Worried About Running Out of Food in the Last Year:  Patient declined    Barista in the Last Year: Patient declined  Transportation Needs: No Transportation Needs (09/08/2022)   PRAPARE - Administrator, Civil Service (Medical): No    Lack of Transportation (Non-Medical): No  Physical Activity: Sufficiently Active (09/08/2022)   Exercise Vital Sign    Days of Exercise per Week: 6 days    Minutes of Exercise per Session: 60 min  Stress: Stress Concern Present (09/08/2022)   [employer] of Occupational Health - Occupational Stress Questionnaire    Feeling of Stress : Very much  Social Connections: Moderately Integrated (09/08/2022)   Social Connection and Isolation Panel [NHANES]    Frequency of Communication with Friends and Family: Twice  a week    Frequency of Social Gatherings with Friends and Family: Twice a week    Attends Religious Services: More than 4 times per year    Active Member of [employer] or Organizations: No    Attends Engineer, structural: Not on file    Marital Status: Married  Catering manager Violence: Not on file     Constitutional: Denies fever, malaise, fatigue, headache or abrupt weight changes.  HEENT: Denies eye pain, eye redness, ear pain, ringing in the ears, wax buildup, runny nose, nasal congestion, bloody nose, or sore throat. Respiratory: Denies difficulty breathing, shortness of breath, cough or sputum production.   Cardiovascular: Denies chest pain, chest tightness, palpitations or swelling in the hands or feet.  Gastrointestinal: Denies abdominal pain, bloating, constipation, diarrhea or blood in the stool.  GU: Denies urgency, frequency, pain with urination, burning sensation, blood in urine, odor or discharge. Musculoskeletal: Patient reports joint pain. Denies decrease in range of motion, difficulty with gait, muscle pain or joint swelling.  Skin: Denies redness, rashes, lesions or ulcercations.  Neurological: Denies dizziness, difficulty with memory, difficulty with speech or problems with balance and coordination.  Psych: Denies anxiety, depression, SI/HI.  No other specific complaints in a complete review of systems (except as listed in HPI above).  Objective:   Physical Exam  BP (!) 182/100 (BP Location: Right Arm, Patient Position: Sitting, Cuff Size: Large) Comment: manual blood pressure  Pulse 89   Temp (!) 97.5 F (36.4 C) (Oral)   Ht 5' 7.5" (1.715 m)   Wt (!) 302 lb 3.2 oz (137.1 kg)   BMI 46.63 kg/m   Wt Readings from Last 3 Encounters:  05/30/22 (!) 309 lb (140.2 kg)  10/25/21 (!) 309 lb (140.2 kg)  10/11/21 (!) 306 lb (138.8 kg)    General: Appears his stated age, obese in NAD. Skin: Warm, dry and intact. No  ulcerations noted. HEENT: Head: normal shape  and size; Eyes: sclera white, no icterus, conjunctiva pink, PERRLA and EOMs intact;  Cardiovascular: Normal rate and rhythm. S1,S2 noted.  No murmur, rubs or gallops noted. No JVD or BLE edema.  Pulmonary/Chest: Normal effort and positive vesicular breath sounds. No respiratory distress. No wheezes, rales or ronchi noted.  Musculoskeletal:  No signs of joint swelling. No difficulty with gait.  Neurological: Alert and oriented. Cranial nerves II-XII grossly intact. Coordination normal.  Psychiatric: Mood and affect normal. Behavior is normal. Judgment and thought content normal.    BMET    Component Value Date/Time   NA 137 05/30/2022 0846   NA 140 12/30/2016 1001   K 4.1 05/30/2022 0846   CL 104 05/30/2022 0846   CO2 23 05/30/2022 0846   GLUCOSE 127 05/30/2022 0846   BUN 16 05/30/2022  0846   BUN 17 12/30/2016 1001   CREATININE 1.11 05/30/2022 0846   CALCIUM 9.9 05/30/2022 0846   GFRNONAA 86 12/30/2016 1001   GFRAA 99 12/30/2016 1001    Lipid Panel     Component Value Date/Time   CHOL 204 (H) 05/30/2022 0846   CHOL 166 12/30/2016 1001   TRIG 148 05/30/2022 0846   HDL 42 05/30/2022 0846   HDL 31 (L) 12/30/2016 1001   CHOLHDL 4.9 05/30/2022 0846   VLDL 24.2 05/13/2019 1625   LDLCALC 135 (H) 05/30/2022 0846    CBC    Component Value Date/Time   WBC 4.0 09/20/2021 0819   RBC 4.58 09/20/2021 0819   HGB 15.0 09/20/2021 0819   HGB 15.4 12/30/2016 1001   HCT 44.2 09/20/2021 0819   HCT 44.2 12/30/2016 1001   PLT 218 09/20/2021 0819   PLT 216 12/30/2016 1001   MCV 96.5 09/20/2021 0819   MCV 93 12/30/2016 1001   MCH 32.8 09/20/2021 0819   MCHC 33.9 09/20/2021 0819   RDW 12.6 09/20/2021 0819   RDW 13.5 12/30/2016 1001   LYMPHSABS 1.9 09/28/2015 1434   MONOABS 0.4 09/28/2015 1434   EOSABS 0.1 09/28/2015 1434   BASOSABS 0.0 09/28/2015 1434    Hgb A1C Lab Results  Component Value Date   HGBA1C 6.6 (H) 05/30/2022            Assessment & Plan:     RTC in 2  weeks, follow-up HTN 3 months for your annual exam Nicki Reaper, NP

## 2022-09-09 NOTE — Assessment & Plan Note (Addendum)
Not a candidate for anticoagulation Discussed the importance of good blood pressure control Continue losartan HCT and hydralazine Will add labetalol 100 mg 3 times daily Reinforced DASH diet and exercise for weight loss He is willing to start statin but will check lipid profile today C-Met today

## 2022-09-09 NOTE — Assessment & Plan Note (Signed)
Noncompliant with CPAP Encouraged weight loss as this can help reduce sleep apnea symptoms 

## 2022-09-09 NOTE — Assessment & Plan Note (Signed)
Encourage diet and exercise for weight loss 

## 2022-09-09 NOTE — Assessment & Plan Note (Signed)
Uncontrolled on current regimen Continue losartan HCT and hydralazine Will add labetalol 100 mg 3 times daily Reinforced DASH diet and exercise for weight loss C-Met today

## 2022-09-09 NOTE — Assessment & Plan Note (Signed)
Improved Discussed the importance of good blood pressure control Continue losartan HCT and hydralazine Will add labetalol 100 mg 3 times daily Reinforced DASH diet and exercise for weight loss He is willing to start statin but will check lipid profile today C-Met today

## 2022-09-10 LAB — COMPLETE METABOLIC PANEL WITH GFR
AG Ratio: 1.5 (calc) (ref 1.0–2.5)
ALT: 22 U/L (ref 9–46)
AST: 18 U/L (ref 10–40)
Albumin: 4.4 g/dL (ref 3.6–5.1)
Alkaline phosphatase (APISO): 65 U/L (ref 36–130)
BUN: 14 mg/dL (ref 7–25)
CO2: 24 mmol/L (ref 20–32)
Calcium: 9.9 mg/dL (ref 8.6–10.3)
Chloride: 104 mmol/L (ref 98–110)
Creat: 1.06 mg/dL (ref 0.60–1.26)
Globulin: 3 g/dL (calc) (ref 1.9–3.7)
Glucose, Bld: 118 mg/dL — ABNORMAL HIGH (ref 65–99)
Potassium: 4 mmol/L (ref 3.5–5.3)
Sodium: 138 mmol/L (ref 135–146)
Total Bilirubin: 0.6 mg/dL (ref 0.2–1.2)
Total Protein: 7.4 g/dL (ref 6.1–8.1)
eGFR: 92 mL/min/{1.73_m2} (ref >=60)

## 2022-09-10 LAB — LIPID PANEL
Cholesterol: 216 mg/dL — ABNORMAL HIGH (ref <200)
HDL: 39 mg/dL — ABNORMAL LOW (ref >=40)
LDL Cholesterol (Calc): 152 mg/dL (calc) — ABNORMAL HIGH
Non-HDL Cholesterol (Calc): 177 mg/dL (calc) — ABNORMAL HIGH (ref <130)
Total CHOL/HDL Ratio: 5.5 (calc) — ABNORMAL HIGH (ref <5.0)
Triglycerides: 128 mg/dL (ref <150)

## 2022-09-10 LAB — CBC
HCT: 46.8 % (ref 38.5–50.0)
Hemoglobin: 15.7 g/dL (ref 13.2–17.1)
MCH: 33 pg (ref 27.0–33.0)
MCHC: 33.5 g/dL (ref 32.0–36.0)
MCV: 98.3 fL (ref 80.0–100.0)
MPV: 9.4 fL (ref 7.5–12.5)
Platelets: 225 10*3/uL (ref 140–400)
RBC: 4.76 10*6/uL (ref 4.20–5.80)
RDW: 12.9 % (ref 11.0–15.0)
WBC: 4.4 10*3/uL (ref 3.8–10.8)

## 2022-09-25 ENCOUNTER — Ambulatory Visit: Payer: 59 | Admitting: Internal Medicine

## 2022-09-25 NOTE — Progress Notes (Deleted)
Subjective:    Patient ID: Brad Mcgee, male    DOB: 02-27-1986, 37 y.o.   MRN: [id number]  HPI  Pt presents to the clinic today for 2 week follow up of HTN. He is taking losartan HCT and hydralazine as prescribed. Labetalol was added at his last visit.  He has been taking the medication as prescribed.  His BP today is.  ECG from 07/2015 reviewed.  Review of Systems     Past Medical History:  Diagnosis Date   CVA (cerebral vascular accident) (HCC)    DVT (deep venous thrombosis) (HCC)    Fracture of left ankle    treated with cast   Hypertension     Current Outpatient Medications  Medication Sig Dispense Refill   gabapentin (NEURONTIN) 100 MG capsule Take 1 capsule (100 mg total) by mouth at bedtime. 90 capsule 1   hydrALAZINE (APRESOLINE) 25 MG tablet TAKE (1) TABLET BY MOUTH TWICE DAILY (Patient taking differently: Take 25 mg by mouth 3 (three) times daily. TAKE (1) TABLET BY MOUTH TWICE DAILY) 180 tablet 1   labetalol (NORMODYNE) 100 MG tablet Take 1 tablet (100 mg total) by mouth 3 (three) times daily. 270 tablet 0   losartan-hydrochlorothiazide (HYZAAR) 100-12.5 MG tablet Take 1 tablet by mouth daily. 90 tablet 1   No current facility-administered medications for this visit.    Allergies  Allergen Reactions   Bee Venom Anaphylaxis   Coconut (Cocos Nucifera) Anaphylaxis   Ancef [Cefazolin] Rash    Family History  Problem Relation Age of Onset   Hypertension Mother    Breast cancer Maternal Grandmother    Hypertension Maternal Grandmother    Prostate cancer Maternal Grandfather    Hypertension Maternal Grandfather    Hypertension Paternal Grandfather    Diabetes Paternal Grandfather    Multiple sclerosis Paternal Grandmother     Social History   Socioeconomic History   Marital status: Married    Spouse name: Not on file   Number of children: Not on file   Years of education: Not on file   Highest education level: 12th grade  Occupational History   Not  on file  Tobacco Use   Smoking status: Never   Smokeless tobacco: Never  Vaping Use   Vaping Use: Never used  Substance and Sexual Activity   Alcohol use: No   Drug use: No   Sexual activity: Never  Other Topics Concern   Not on file  Social History Narrative   Not on file   Social Determinants of Health   Financial Resource Strain: Patient Declined (09/08/2022)   Overall Financial Resource Strain (CARDIA)    Difficulty of Paying Living Expenses: Patient declined  Food Insecurity: Patient Declined (09/08/2022)   Hunger Vital Sign    Worried About Running Out of Food in the Last Year: Patient declined    Ran Out of Food in the Last Year: Patient declined  Transportation Needs: No Transportation Needs (09/08/2022)   PRAPARE - Administrator, Civil Service (Medical): No    Lack of Transportation (Non-Medical): No  Physical Activity: Sufficiently Active (09/08/2022)   Exercise Vital Sign    Days of Exercise per Week: 6 days    Minutes of Exercise per Session: 60 min  Stress: Stress Concern Present (09/08/2022)   [employer] of Occupational Health - Occupational Stress Questionnaire    Feeling of Stress : Very much  Social Connections: Moderately Integrated (09/08/2022)   Social Connection and Isolation Panel [NHANES]  Frequency of Communication with Friends and Family: Twice a week    Frequency of Social Gatherings with Friends and Family: Twice a week    Attends Religious Services: More than 4 times per year    Active Member of [employer] or Organizations: No    Attends Engineer, structural: Not on file    Marital Status: Married  Catering manager Violence: Not on file     Constitutional: Denies fever, malaise, fatigue, headache or abrupt weight changes.  HEENT: Denies eye pain, eye redness, ear pain, ringing in the ears, wax buildup, runny nose, nasal congestion, bloody nose, or sore throat. Respiratory: Denies difficulty breathing, shortness of breath,  cough or sputum production.   Cardiovascular: Denies chest pain, chest tightness, palpitations or swelling in the hands or feet.  Gastrointestinal: Denies abdominal pain, bloating, constipation, diarrhea or blood in the stool.  GU: Denies urgency, frequency, pain with urination, burning sensation, blood in urine, odor or discharge. Musculoskeletal: Patient reports left-sided weakness, ankle pain.  Denies decrease in range of motion, difficulty with gait, muscle pain or joint swelling.  Skin: Denies redness, rashes, lesions or ulcercations.  Neurological: Denies dizziness, difficulty with memory, difficulty with speech or problems with balance and coordination.  Psych: Denies anxiety, depression, SI/HI.  No other specific complaints in a complete review of systems (except as listed in HPI above).  Objective:   Physical Exam  There were no vitals taken for this visit. Wt Readings from Last 3 Encounters:  09/09/22 (!) 302 lb 3.2 oz (137.1 kg)  05/30/22 (!) 309 lb (140.2 kg)  10/25/21 (!) 309 lb (140.2 kg)    General: Appears their stated age, well developed, well nourished in NAD. Skin: Warm, dry and intact. No rashes, lesions or ulcerations noted. HEENT: Head: normal shape and size; Eyes: sclera white, no icterus, conjunctiva pink, PERRLA and EOMs intact; Ears: Tm's gray and intact, normal light reflex; Nose: mucosa pink and moist, septum midline; Throat/Mouth: Teeth present, mucosa pink and moist, no exudate, lesions or ulcerations noted.  Neck:  Neck supple, trachea midline. No masses, lumps or thyromegaly present.  Cardiovascular: Normal rate and rhythm. S1,S2 noted.  No murmur, rubs or gallops noted. No JVD or BLE edema. No carotid bruits noted. Pulmonary/Chest: Normal effort and positive vesicular breath sounds. No respiratory distress. No wheezes, rales or ronchi noted.  Abdomen: Soft and nontender. Normal bowel sounds. No distention or masses noted. Liver, spleen and kidneys non  palpable. Musculoskeletal: Normal range of motion. No signs of joint swelling. No difficulty with gait.  Neurological: Alert and oriented. Cranial nerves II-XII grossly intact. Coordination normal.  Psychiatric: Mood and affect normal. Behavior is normal. Judgment and thought content normal.     BMET    Component Value Date/Time   NA 138 09/09/2022 0946   NA 140 12/30/2016 1001   K 4.0 09/09/2022 0946   CL 104 09/09/2022 0946   CO2 24 09/09/2022 0946   GLUCOSE 118 (H) 09/09/2022 0946   BUN 14 09/09/2022 0946   BUN 17 12/30/2016 1001   CREATININE 1.06 09/09/2022 0946   CALCIUM 9.9 09/09/2022 0946   GFRNONAA 86 12/30/2016 1001   GFRAA 99 12/30/2016 1001    Lipid Panel     Component Value Date/Time   CHOL 216 (H) 09/09/2022 0946   CHOL 166 12/30/2016 1001   TRIG 128 09/09/2022 0946   HDL 39 (L) 09/09/2022 0946   HDL 31 (L) 12/30/2016 1001   CHOLHDL 5.5 (H) 09/09/2022 1610  VLDL 24.2 05/13/2019 1625   LDLCALC 152 (H) 09/09/2022 0946    CBC    Component Value Date/Time   WBC 4.4 09/09/2022 0946   RBC 4.76 09/09/2022 0946   HGB 15.7 09/09/2022 0946   HGB 15.4 12/30/2016 1001   HCT 46.8 09/09/2022 0946   HCT 44.2 12/30/2016 1001   PLT 225 09/09/2022 0946   PLT 216 12/30/2016 1001   MCV 98.3 09/09/2022 0946   MCV 93 12/30/2016 1001   MCH 33.0 09/09/2022 0946   MCHC 33.5 09/09/2022 0946   RDW 12.9 09/09/2022 0946   RDW 13.5 12/30/2016 1001   LYMPHSABS 1.9 09/28/2015 1434   MONOABS 0.4 09/28/2015 1434   EOSABS 0.1 09/28/2015 1434   BASOSABS 0.0 09/28/2015 1434    Hgb A1C Lab Results  Component Value Date   HGBA1C 6.4 09/09/2022            Assessment & Plan:     RTC in 2 months for annual exam Nicki Reaper, NP

## 2022-11-14 ENCOUNTER — Other Ambulatory Visit: Payer: Self-pay | Admitting: Internal Medicine

## 2022-11-15 NOTE — Telephone Encounter (Signed)
Requested Prescriptions  Pending Prescriptions Disp Refills   losartan-hydrochlorothiazide (HYZAAR) 100-12.5 MG tablet [Pharmacy Med Name: LOSARTAN/HCT TAB 100-12.5] 90 tablet 0    Sig: TAKE 1 TABLET BY MOUTH DAILY.     Cardiovascular: ARB + Diuretic Combos Failed - 11/14/2022 12:50 PM      Failed - Last BP in normal range    BP Readings from Last 1 Encounters:  09/09/22 (!) 182/100         Passed - K in normal range and within 180 days    Potassium  Date Value Ref Range Status  09/09/2022 4.0 3.5 - 5.3 mmol/L Final         Passed - Na in normal range and within 180 days    Sodium  Date Value Ref Range Status  09/09/2022 138 135 - 146 mmol/L Final  12/30/2016 140 134 - 144 mmol/L Final         Passed - Cr in normal range and within 180 days    Creat  Date Value Ref Range Status  09/09/2022 1.06 0.60 - 1.26 mg/dL Final         Passed - eGFR is 10 or above and within 180 days    GFR calc Af Amer  Date Value Ref Range Status  12/30/2016 99 >59 mL/min/1.73 Final   GFR calc non Af Amer  Date Value Ref Range Status  12/30/2016 86 >59 mL/min/1.73 Final   GFR  Date Value Ref Range Status  05/13/2019 96.30 >60.00 mL/min Final   eGFR  Date Value Ref Range Status  09/09/2022 92 > OR = 60 mL/min/1.95m2 Final         Passed - Patient is not pregnant      Passed - Valid encounter within last 6 months    Recent Outpatient Visits           2 months ago Type 2 diabetes mellitus with hyperglycemia, without long-term current use of insulin (HCC)   [place] [facility] [place], [place] W, NP   5 months ago Type 2 diabetes mellitus with hyperglycemia, without long-term current use of insulin [facility])   [place] [facility] [place], Salvadore Oxford, NP   5 months ago Prediabetes   [place] [facility] [place], Salvadore Oxford, NP   1 year ago Essential (primary) hypertension   [place] [facility] [place], Salvadore Oxford, NP   1 year  ago Essential (primary) hypertension   [place] [facility] [place], Salvadore Oxford, NP       Future Appointments             In 2 weeks Sampson Si, Salvadore Oxford, NP [place] [facility]             gabapentin (NEURONTIN) 100 MG capsule [Pharmacy Med Name: GABAPENTIN CAP 100MG ] 90 capsule 0    Sig: TAKE 1 CAPSULE (100 MG TOTAL) BY MOUTH AT BEDTIME.     Neurology: Anticonvulsants - gabapentin Passed - 11/14/2022 12:50 PM      Passed - Cr in normal range and within 360 days    Creat  Date Value Ref Range Status  09/09/2022 1.06 0.60 - 1.26 mg/dL Final         Passed - Completed PHQ-2 or PHQ-9 in the last 360 days      Passed - Valid encounter within last 12 months    Recent Outpatient Visits  2 months ago Type 2 diabetes mellitus with hyperglycemia, without long-term current use of insulin [facility])   [place] [facility] [place], [place] W, NP   5 months ago Type 2 diabetes mellitus with hyperglycemia, without long-term current use of insulin [facility])   [place] [facility] [place], Salvadore Oxford, NP   5 months ago Prediabetes   [place] [facility] [place], Salvadore Oxford, NP   1 year ago Essential (primary) hypertension   [place] [facility] [place], Salvadore Oxford, NP   1 year ago Essential (primary) hypertension   [place] [facility] [place], Salvadore Oxford, NP       Future Appointments             In 2 weeks Sampson Si, Salvadore Oxford, NP [place] [facility], [facility]

## 2022-11-29 ENCOUNTER — Encounter: Payer: Self-pay | Admitting: Pharmacist

## 2022-12-03 ENCOUNTER — Encounter: Payer: 59 | Admitting: Internal Medicine

## 2023-09-05 ENCOUNTER — Other Ambulatory Visit: Payer: Self-pay | Admitting: Internal Medicine

## 2023-09-08 NOTE — Telephone Encounter (Signed)
 Requested medications are due for refill today.  yes  Requested medications are on the active medications list.  yes  Last refill. 11/15/2022 #90 0 rf  Future visit scheduled.   no  Notes to clinic.  Pt last seen 09/09/2022 - pt has missed a few scheduled appts.     Requested Prescriptions  Pending Prescriptions Disp Refills   gabapentin  (NEURONTIN ) 100 MG capsule [Pharmacy Med Name: GABAPENTIN  100MG  CAPSULE] 90 capsule 0    Sig: TAKE (1) CAPSULE BY MOUTH AT BEDTIME     Neurology: Anticonvulsants - gabapentin  Failed - 09/08/2023  5:44 PM      Failed - Cr in normal range and within 360 days    Creat  Date Value Ref Range Status  09/09/2022 1.06 0.60 - 1.26 mg/dL Final         Failed - Completed PHQ-2 or PHQ-9 in the last 360 days      Failed - Valid encounter within last 12 months    Recent Outpatient Visits   None             losartan -hydrochlorothiazide  (HYZAAR) 100-12.5 MG tablet [Pharmacy Med Name: LOSARTAN  POTASSIUM/HYDROCHLOROTHIAZIDE  100-12.5 TABLET] 90 tablet 0    Sig: TAKE ONE (1) TABLET BY MOUTH DAILY.     Cardiovascular: ARB + Diuretic Combos Failed - 09/08/2023  5:44 PM      Failed - K in normal range and within 180 days    Potassium  Date Value Ref Range Status  09/09/2022 4.0 3.5 - 5.3 mmol/L Final         Failed - Na in normal range and within 180 days    Sodium  Date Value Ref Range Status  09/09/2022 138 135 - 146 mmol/L Final  12/30/2016 140 134 - 144 mmol/L Final         Failed - Cr in normal range and within 180 days    Creat  Date Value Ref Range Status  09/09/2022 1.06 0.60 - 1.26 mg/dL Final         Failed - eGFR is 10 or above and within 180 days    GFR calc Af Amer  Date Value Ref Range Status  12/30/2016 99 >59 mL/min/1.73 Final   GFR calc non Af Amer  Date Value Ref Range Status  12/30/2016 86 >59 mL/min/1.73 Final   GFR  Date Value Ref Range Status  05/13/2019 96.30 >60.00 mL/min Final   eGFR  Date Value Ref Range Status   09/09/2022 92 > OR = 60 mL/min/1.87m2 Final         Failed - Last BP in normal range    BP Readings from Last 1 Encounters:  09/09/22 (!) 182/100         Failed - Valid encounter within last 6 months    Recent Outpatient Visits   None            Passed - Patient is not pregnant       hydrALAZINE  (APRESOLINE ) 25 MG tablet [Pharmacy Med Name: HYDRALAZINE  HYDROCHLORIDE 25MG  TABLET] 180 tablet 0    Sig: TAKE (1) TABLET BY MOUTH TWICE DAILY     Cardiovascular:  Vasodilators Failed - 09/08/2023  5:44 PM      Failed - HCT in normal range and within 360 days    HCT  Date Value Ref Range Status  09/09/2022 46.8 38.5 - 50.0 % Final   Hematocrit  Date Value Ref Range Status  12/30/2016 44.2 37.5 - 51.0 % Final  Failed - HGB in normal range and within 360 days    Hemoglobin  Date Value Ref Range Status  09/09/2022 15.7 13.2 - 17.1 g/dL Final  89/84/7981 84.5 13.0 - 17.7 g/dL Final         Failed - RBC in normal range and within 360 days    RBC  Date Value Ref Range Status  09/09/2022 4.76 4.20 - 5.80 Million/uL Final         Failed - WBC in normal range and within 360 days    WBC  Date Value Ref Range Status  09/09/2022 4.4 3.8 - 10.8 Thousand/uL Final         Failed - PLT in normal range and within 360 days    Platelets  Date Value Ref Range Status  09/09/2022 225 140 - 400 Thousand/uL Final  12/30/2016 216 150 - 379 x10E3/uL Final         Failed - ANA Screen, Ifa, Serum in normal range and within 360 days    No results found for: ANA, ANATITER, LABANTI       Failed - Last BP in normal range    BP Readings from Last 1 Encounters:  09/09/22 (!) 182/100         Failed - Valid encounter within last 12 months    Recent Outpatient Visits   None

## 2024-02-04 ENCOUNTER — Encounter: Payer: Self-pay | Admitting: Internal Medicine

## 2024-02-18 ENCOUNTER — Encounter: Payer: Self-pay | Admitting: Internal Medicine

## 2024-02-18 ENCOUNTER — Ambulatory Visit: Admitting: Internal Medicine

## 2024-02-18 VITALS — BP 188/120 | Ht 67.5 in | Wt 306.2 lb

## 2024-02-18 DIAGNOSIS — I10 Essential (primary) hypertension: Secondary | ICD-10-CM

## 2024-02-18 DIAGNOSIS — Z6841 Body Mass Index (BMI) 40.0 and over, adult: Secondary | ICD-10-CM

## 2024-02-18 DIAGNOSIS — Z0001 Encounter for general adult medical examination with abnormal findings: Secondary | ICD-10-CM

## 2024-02-18 DIAGNOSIS — E1165 Type 2 diabetes mellitus with hyperglycemia: Secondary | ICD-10-CM

## 2024-02-18 LAB — HEMOGLOBIN A1C
Hgb A1c MFr Bld: 6.4 % — ABNORMAL HIGH (ref <5.7)
Mean Plasma Glucose: 137 mg/dL
eAG (mmol/L): 7.6 mmol/L

## 2024-02-18 LAB — COMPREHENSIVE METABOLIC PANEL WITH GFR
AG Ratio: 1.5 (calc) (ref 1.0–2.5)
ALT: 17 U/L (ref 9–46)
AST: 17 U/L (ref 10–40)
Albumin: 4.3 g/dL (ref 3.6–5.1)
Alkaline phosphatase (APISO): 63 U/L (ref 36–130)
BUN: 10 mg/dL (ref 7–25)
CO2: 25 mmol/L (ref 20–32)
Calcium: 9.3 mg/dL (ref 8.6–10.3)
Chloride: 103 mmol/L (ref 98–110)
Creat: 1.13 mg/dL (ref 0.60–1.26)
Globulin: 2.9 g/dL (ref 1.9–3.7)
Glucose, Bld: 96 mg/dL (ref 65–99)
Potassium: 4.3 mmol/L (ref 3.5–5.3)
Sodium: 138 mmol/L (ref 135–146)
Total Bilirubin: 0.6 mg/dL (ref 0.2–1.2)
Total Protein: 7.2 g/dL (ref 6.1–8.1)
eGFR: 85 mL/min/1.73m2 (ref >=60)

## 2024-02-18 LAB — CBC
HCT: 47.6 % (ref 39.4–51.1)
Hemoglobin: 16.2 g/dL (ref 13.2–17.1)
MCH: 32.5 pg (ref 27.0–33.0)
MCHC: 34 g/dL (ref 31.6–35.4)
MCV: 95.6 fL (ref 81.4–101.7)
MPV: 10.3 fL (ref 7.5–12.5)
Platelets: 271 Thousand/uL (ref 140–400)
RBC: 4.98 Million/uL (ref 4.20–5.80)
RDW: 13.1 % (ref 11.0–15.0)
WBC: 5.4 Thousand/uL (ref 3.8–10.8)

## 2024-02-18 LAB — LIPID PANEL
Cholesterol: 219 mg/dL — ABNORMAL HIGH (ref <200)
HDL: 41 mg/dL (ref >=40)
LDL Cholesterol (Calc): 158 mg/dL — ABNORMAL HIGH
Non-HDL Cholesterol (Calc): 178 mg/dL — ABNORMAL HIGH (ref <130)
Total CHOL/HDL Ratio: 5.3 (calc) — ABNORMAL HIGH (ref <5.0)
Triglycerides: 92 mg/dL (ref <150)

## 2024-02-18 MED ORDER — LABETALOL HCL 100 MG PO TABS: 100.0000 mg | ORAL_TABLET | Freq: Three times a day (TID) | ORAL | 1 refills | Status: AC

## 2024-02-18 MED ORDER — LOSARTAN POTASSIUM-HCTZ 100-12.5 MG PO TABS: 1.0000 | ORAL_TABLET | Freq: Every day | ORAL | 1 refills | Status: AC

## 2024-02-18 MED ORDER — HYDRALAZINE HCL 25 MG PO TABS: ORAL_TABLET | ORAL | 1 refills | Status: AC

## 2024-02-18 MED ORDER — GABAPENTIN 100 MG PO CAPS: 100.0000 mg | ORAL_CAPSULE | Freq: Every day | ORAL | 1 refills | Status: AC

## 2024-02-18 NOTE — Assessment & Plan Note (Signed)
 Complicated by morbid obesity Uncontrolled off losartan -HCT at 100-25 mg daily, labetalol  100 mg 3 times daily and hydralazine  25 mg twice daily Reinforced DASH diet and exercise for weight loss C-Met today

## 2024-02-18 NOTE — Assessment & Plan Note (Signed)
 Encourage diet and exercise for weight loss

## 2024-02-18 NOTE — Progress Notes (Signed)
 Subjective:    Patient ID: Brad Mcgee, male    DOB: 30-Dec-1985, 38 y.o.   MRN: [id number]  HPI  Patient presents to clinic today for his annual exam.  Of note, his BP today is 188/120.  He admits that he has been out of his losartan  HCT, labetalol  and hydralazine .  Flu: never Tetanus: 01/2017 COVID: x2 Prevnar 20: Never Vision screening: as needed Dentist: biannually  Diet: He does eat meat. He consumes more veggies than fruits. He does eat some fried foods. He drinks mostly water and tea. Exercise: Walking  Review of Systems     Past Medical History:  Diagnosis Date   CVA (cerebral vascular accident) (HCC)    DVT (deep venous thrombosis) (HCC)    Fracture of left ankle    treated with cast   Hypertension     Current Outpatient Medications  Medication Sig Dispense Refill   gabapentin  (NEURONTIN ) 100 MG capsule TAKE 1 CAPSULE (100 MG TOTAL) BY MOUTH AT BEDTIME. 90 capsule 0   hydrALAZINE  (APRESOLINE ) 25 MG tablet TAKE (1) TABLET BY MOUTH TWICE DAILY (Patient taking differently: Take 25 mg by mouth 3 (three) times daily. TAKE (1) TABLET BY MOUTH TWICE DAILY) 180 tablet 1   labetalol  (NORMODYNE ) 100 MG tablet Take 1 tablet (100 mg total) by mouth 3 (three) times daily. 270 tablet 0   losartan -hydrochlorothiazide  (HYZAAR) 100-12.5 MG tablet TAKE 1 TABLET BY MOUTH DAILY. 90 tablet 0   No current facility-administered medications for this visit.    Allergies  Allergen Reactions   Bee Venom Anaphylaxis   Coconut (Cocos Nucifera) Anaphylaxis   Ancef  [Cefazolin ] Rash    Family History  Problem Relation Age of Onset   Hypertension Mother    Breast cancer Maternal Grandmother    Hypertension Maternal Grandmother    Prostate cancer Maternal Grandfather    Hypertension Maternal Grandfather    Hypertension Paternal Grandfather    Diabetes Paternal Grandfather    Multiple sclerosis Paternal Grandmother     Social History   Socioeconomic History   Marital status:  Married    Spouse name: Not on file   Number of children: Not on file   Years of education: Not on file   Highest education level: 12th grade  Occupational History   Not on file  Tobacco Use   Smoking status: Never   Smokeless tobacco: Never  Vaping Use   Vaping status: Never Used  Substance and Sexual Activity   Alcohol use: No   Drug use: No   Sexual activity: Never  Other Topics Concern   Not on file  Social History Narrative   Not on file   Social Drivers of Health   Financial Resource Strain: Patient Declined (09/08/2022)   Overall Financial Resource Strain (CARDIA)    Difficulty of Paying Living Expenses: Patient declined  Food Insecurity: Patient Declined (09/08/2022)   Hunger Vital Sign    Worried About Running Out of Food in the Last Year: Patient declined    Ran Out of Food in the Last Year: Patient declined  Transportation Needs: No Transportation Needs (09/08/2022)   PRAPARE - Administrator, Civil Service (Medical): No    Lack of Transportation (Non-Medical): No  Physical Activity: Sufficiently Active (09/08/2022)   Exercise Vital Sign    Days of Exercise per Week: 6 days    Minutes of Exercise per Session: 60 min  Stress: Stress Concern Present (09/08/2022)   [employer] of Occupational Health -  Occupational Stress Questionnaire    Feeling of Stress : Very much  Social Connections: Moderately Integrated (09/08/2022)   Social Connection and Isolation Panel    Frequency of Communication with Friends and Family: Twice a week    Frequency of Social Gatherings with Friends and Family: Twice a week    Attends Religious Services: More than 4 times per year    Active Member of [employer] or Organizations: No    Attends Engineer, Structural: Not on file    Marital Status: Married  Catering Manager Violence: Not on file     Constitutional: Denies fever, malaise, fatigue, headache or abrupt weight changes.  HEENT: Denies eye pain, eye redness,  ear pain, ringing in the ears, wax buildup, runny nose, nasal congestion, bloody nose, or sore throat. Respiratory: Denies difficulty breathing, shortness of breath, cough or sputum production.   Cardiovascular: Denies chest pain, chest tightness, palpitations or swelling in the hands or feet.  Gastrointestinal: Denies abdominal pain, bloating, constipation, diarrhea or blood in the stool.  GU: Denies urgency, frequency, pain with urination, burning sensation, blood in urine, odor or discharge. Musculoskeletal: Pt reports left upper extremity weakness. Denies decrease in range of motion, difficulty with gait, muscle pain or joint pain and swelling.  Skin: Denies redness, rashes, lesions or ulcercations.  Neurological: Denies dizziness, difficulty with memory, difficulty with speech or problems with balance and coordination.  Psych: Denies anxiety, depression, SI/HI.  No other specific complaints in a complete review of systems (except as listed in HPI above).  Objective:   Physical Exam BP (!) 188/120 (BP Location: Left Arm, Patient Position: Sitting, Cuff Size: Large)   Ht 5' 7.5 (1.715 m)   Wt (!) 306 lb 3.2 oz (138.9 kg)   BMI 47.25 kg/m    Wt Readings from Last 3 Encounters:  09/09/22 (!) 302 lb 3.2 oz (137.1 kg)  05/30/22 (!) 309 lb (140.2 kg)  10/25/21 (!) 309 lb (140.2 kg)    General: Appears his stated age, obese, in NAD. Skin: Warm, dry and intact.  No ulcerations noted. HEENT: Head: normal shape and size; Eyes: sclera white, no icterus, conjunctiva pink, pupils pinpoint but PERRLA and EOMs intact;  Neck:  Neck supple, trachea midline. No masses, lumps or thyromegaly present.  Cardiovascular: Normal rate and rhythm. S1,S2 noted.  No murmur, rubs or gallops noted. No JVD or BLE edema.  Pulmonary/Chest: Normal effort and positive vesicular breath sounds. No respiratory distress. No wheezes, rales or ronchi noted.  Abdomen: Soft and nontender. Normal bowel sounds.   Musculoskeletal: Strength 4/5 LUE.  Strength 5/5 RUE and BLE.  No difficulty with gait.  Neurological: Alert and oriented. He is under the influence of THC. Cranial nerves II-XII grossly intact. Coordination normal.  Psychiatric: Mood and affect normal. Behavior is normal. Judgment and thought content normal.    BMET    Component Value Date/Time   NA 138 09/09/2022 0946   NA 140 12/30/2016 1001   K 4.0 09/09/2022 0946   CL 104 09/09/2022 0946   CO2 24 09/09/2022 0946   GLUCOSE 118 (H) 09/09/2022 0946   BUN 14 09/09/2022 0946   BUN 17 12/30/2016 1001   CREATININE 1.06 09/09/2022 0946   CALCIUM 9.9 09/09/2022 0946   GFRNONAA 86 12/30/2016 1001   GFRAA 99 12/30/2016 1001    Lipid Panel     Component Value Date/Time   CHOL 216 (H) 09/09/2022 0946   CHOL 166 12/30/2016 1001   TRIG 128 09/09/2022 0946  HDL 39 (L) 09/09/2022 0946   HDL 31 (L) 12/30/2016 1001   CHOLHDL 5.5 (H) 09/09/2022 0946   VLDL 24.2 05/13/2019 1625   LDLCALC 152 (H) 09/09/2022 0946    CBC    Component Value Date/Time   WBC 4.4 09/09/2022 0946   RBC 4.76 09/09/2022 0946   HGB 15.7 09/09/2022 0946   HGB 15.4 12/30/2016 1001   HCT 46.8 09/09/2022 0946   HCT 44.2 12/30/2016 1001   PLT 225 09/09/2022 0946   PLT 216 12/30/2016 1001   MCV 98.3 09/09/2022 0946   MCV 93 12/30/2016 1001   MCH 33.0 09/09/2022 0946   MCHC 33.5 09/09/2022 0946   RDW 12.9 09/09/2022 0946   RDW 13.5 12/30/2016 1001   LYMPHSABS 1.9 09/28/2015 1434   MONOABS 0.4 09/28/2015 1434   EOSABS 0.1 09/28/2015 1434   BASOSABS 0.0 09/28/2015 1434    Hgb A1C Lab Results  Component Value Date   HGBA1C 6.4 09/09/2022           Assessment & Plan:   Preventative Health Maintenance:  Flu shot declined Tetanus UTD Encouraged him to get a COVID-vaccine Prevnar 20 declined Encouraged him to consume a balanced diet and exercise regimen Advised him to see an eye doctor and dentist annually We will check CBC, c-Met, lipid, A1c  and urine microalbumin today  RTC in 2 weeks, followup HTN 6 months, follow-up chronic conditions Angeline Laura, NP

## 2024-02-18 NOTE — Patient Instructions (Signed)
 Health Maintenance, Male  Adopting a healthy lifestyle and getting preventive care are important in promoting health and wellness. Ask your health care provider about:  The right schedule for you to have regular tests and exams.  Things you can do on your own to prevent diseases and keep yourself healthy.  What should I know about diet, weight, and exercise?  Eat a healthy diet    Eat a diet that includes plenty of vegetables, fruits, low-fat dairy products, and lean protein.  Do not eat a lot of foods that are high in solid fats, added sugars, or sodium.  Maintain a healthy weight  Body mass index (BMI) is a measurement that can be used to identify possible weight problems. It estimates body fat based on height and weight. Your health care provider can help determine your BMI and help you achieve or maintain a healthy weight.  Get regular exercise  Get regular exercise. This is one of the most important things you can do for your health. Most adults should:  Exercise for at least 150 minutes each week. The exercise should increase your heart rate and make you sweat (moderate-intensity exercise).  Do strengthening exercises at least twice a week. This is in addition to the moderate-intensity exercise.  Spend less time sitting. Even light physical activity can be beneficial.  Watch cholesterol and blood lipids  Have your blood tested for lipids and cholesterol at 39 years of age, then have this test every 5 years.  You may need to have your cholesterol levels checked more often if:  Your lipid or cholesterol levels are high.  You are older than 39 years of age.  You are at high risk for heart disease.  What should I know about cancer screening?  Many types of cancers can be detected early and may often be prevented. Depending on your health history and family history, you may need to have cancer screening at various ages. This may include screening for:  Colorectal cancer.  Prostate cancer.  Skin cancer.  Lung  cancer.  What should I know about heart disease, diabetes, and high blood pressure?  Blood pressure and heart disease  High blood pressure causes heart disease and increases the risk of stroke. This is more likely to develop in people who have high blood pressure readings or are overweight.  Talk with your health care provider about your target blood pressure readings.  Have your blood pressure checked:  Every 3-5 years if you are 24-52 years of age.  Every year if you are 3 years old or older.  If you are between the ages of 60 and 72 and are a current or former smoker, ask your health care provider if you should have a one-time screening for abdominal aortic aneurysm (AAA).  Diabetes  Have regular diabetes screenings. This checks your fasting blood sugar level. Have the screening done:  Once every three years after age 66 if you are at a normal weight and have a low risk for diabetes.  More often and at a younger age if you are overweight or have a high risk for diabetes.  What should I know about preventing infection?  Hepatitis B  If you have a higher risk for hepatitis B, you should be screened for this virus. Talk with your health care provider to find out if you are at risk for hepatitis B infection.  Hepatitis C  Blood testing is recommended for:  Everyone born from [birth year] through 1966.  Anyone  with known risk factors for hepatitis C.  Sexually transmitted infections (STIs)  You should be screened each year for STIs, including gonorrhea and chlamydia, if:  You are sexually active and are younger than 39 years of age.  You are older than 39 years of age and your health care provider tells you that you are at risk for this type of infection.  Your sexual activity has changed since you were last screened, and you are at increased risk for chlamydia or gonorrhea. Ask your health care provider if you are at risk.  Ask your health care provider about whether you are at high risk for HIV. Your health care provider  may recommend a prescription medicine to help prevent HIV infection. If you choose to take medicine to prevent HIV, you should first get tested for HIV. You should then be tested every 3 months for as long as you are taking the medicine.  Follow these instructions at home:  Alcohol use  Do not drink alcohol if your health care provider tells you not to drink.  If you drink alcohol:  Limit how much you have to 0-2 drinks a day.  Know how much alcohol is in your drink. In the [place] one drink equals one 12 oz bottle of beer (355 mL), one 5 oz glass of wine (148 mL), or one 1 oz glass of hard liquor (44 mL).  Lifestyle  Do not use any products that contain nicotine or tobacco. These products include cigarettes, chewing tobacco, and vaping devices, such as e-cigarettes. If you need help quitting, ask your health care provider.  Do not use street drugs.  Do not share needles.  Ask your health care provider for help if you need support or information about quitting drugs.  General instructions  Schedule regular health, dental, and eye exams.  Stay current with your vaccines.  Tell your health care provider if:  You often feel depressed.  You have ever been abused or do not feel safe at home.  Summary  Adopting a healthy lifestyle and getting preventive care are important in promoting health and wellness.  Follow your health care provider's instructions about healthy diet, exercising, and getting tested or screened for diseases.  Follow your health care provider's instructions on monitoring your cholesterol and blood pressure.  This information is not intended to replace advice given to you by your health care provider. Make sure you discuss any questions you have with your health care provider.  Document Revised: 07/24/2020 Document Reviewed: 07/24/2020  Elsevier Patient Education  2024 [employer].

## 2024-02-19 ENCOUNTER — Ambulatory Visit: Payer: Self-pay | Admitting: Internal Medicine

## 2024-02-19 DIAGNOSIS — E1165 Type 2 diabetes mellitus with hyperglycemia: Secondary | ICD-10-CM

## 2024-02-19 DIAGNOSIS — E1169 Type 2 diabetes mellitus with other specified complication: Secondary | ICD-10-CM

## 2024-02-19 LAB — MICROALBUMIN / CREATININE URINE RATIO
Creatinine, Urine: 175 mg/dL (ref 20–320)
Microalb Creat Ratio: 1593 mg/g{creat} — ABNORMAL HIGH (ref <30)
Microalb, Ur: 278.7 mg/dL

## 2024-02-20 DIAGNOSIS — E1169 Type 2 diabetes mellitus with other specified complication: Secondary | ICD-10-CM | POA: Insufficient documentation

## 2024-02-20 MED ORDER — ATORVASTATIN CALCIUM 20 MG PO TABS: 20.0000 mg | ORAL_TABLET | Freq: Every day | ORAL | 1 refills | Status: AC

## 2024-03-01 ENCOUNTER — Ambulatory Visit: Admitting: Internal Medicine

## 2024-03-07 NOTE — Progress Notes (Deleted)
 "  Subjective:    Patient ID: Janalyn CHRISTELLA Daring, male    DOB: Jun 13, 1985, 38 y.o.   MRN: [id number]  HPI    Review of Systems     Past Medical History:  Diagnosis Date   CVA (cerebral vascular accident) (HCC)    DVT (deep venous thrombosis) (HCC)    Fracture of left ankle    treated with cast   Hypertension     Current Outpatient Medications  Medication Sig Dispense Refill   atorvastatin  (LIPITOR) 20 MG tablet Take 1 tablet (20 mg total) by mouth daily. 90 tablet 1   gabapentin  (NEURONTIN ) 100 MG capsule Take 1 capsule (100 mg total) by mouth at bedtime. 90 capsule 1   hydrALAZINE  (APRESOLINE ) 25 MG tablet TAKE (1) TABLET BY MOUTH TWICE DAILY 180 tablet 1   labetalol  (NORMODYNE ) 100 MG tablet Take 1 tablet (100 mg total) by mouth 3 (three) times daily. 270 tablet 1   losartan -hydrochlorothiazide  (HYZAAR) 100-12.5 MG tablet Take 1 tablet by mouth daily. 90 tablet 1   No current facility-administered medications for this visit.    Allergies  Allergen Reactions   Bee Venom Anaphylaxis   Coconut (Cocos Nucifera) Anaphylaxis   Ancef  [Cefazolin ] Rash    Family History  Problem Relation Age of Onset   Hypertension Mother    Breast cancer Maternal Grandmother    Hypertension Maternal Grandmother    Prostate cancer Maternal Grandfather    Hypertension Maternal Grandfather    Hypertension Paternal Grandfather    Diabetes Paternal Grandfather    Multiple sclerosis Paternal Grandmother     Social History   Socioeconomic History   Marital status: Married    Spouse name: Not on file   Number of children: Not on file   Years of education: Not on file   Highest education level: 12th grade  Occupational History   Not on file  Tobacco Use   Smoking status: Never   Smokeless tobacco: Never  Vaping Use   Vaping status: Never Used  Substance and Sexual Activity   Alcohol use: No   Drug use: No   Sexual activity: Never  Other Topics Concern   Not on file  Social History  Narrative   Not on file   Social Drivers of Health   Tobacco Use: Low Risk (02/18/2024)   Patient History    Smoking Tobacco Use: Never    Smokeless Tobacco Use: Never    Passive Exposure: Not on file  Financial Resource Strain: Patient Declined (09/08/2022)   Overall Financial Resource Strain (CARDIA)    Difficulty of Paying Living Expenses: Patient declined  Food Insecurity: Patient Declined (09/08/2022)   Hunger Vital Sign    Worried About Running Out of Food in the Last Year: Patient declined    Ran Out of Food in the Last Year: Patient declined  Transportation Needs: No Transportation Needs (09/08/2022)   PRAPARE - Administrator, Civil Service (Medical): No    Lack of Transportation (Non-Medical): No  Physical Activity: Sufficiently Active (09/08/2022)   Exercise Vital Sign    Days of Exercise per Week: 6 days    Minutes of Exercise per Session: 60 min  Stress: Stress Concern Present (09/08/2022)   [employer] of Occupational [employer] - Occupational Stress Questionnaire    Feeling of Stress : Very much  Social Connections: Moderately Integrated (09/08/2022)   Social Connection and Isolation Panel    Frequency of Communication with Friends and Family: Twice a week  Frequency of Social Gatherings with Friends and Family: Twice a week    Attends Religious Services: More than 4 times per year    Active Member of [employer] or Organizations: No    Attends Engineer, Structural: Not on file    Marital Status: Married  Catering Manager Violence: Not on file  Depression (PHQ2-9): High Risk (02/18/2024)   Depression (PHQ2-9)    PHQ-2 Score: 13  Alcohol Screen: Low Risk (09/08/2022)   Alcohol Screen    Last Alcohol Screening Score (AUDIT): 0  Housing: Low Risk (09/08/2022)   Housing    Last Housing Risk Score: 0  Utilities: Not on file  Health Literacy: Not on file     Constitutional: Denies fever, malaise, fatigue, headache or abrupt weight changes.  HEENT:  Denies eye pain, eye redness, ear pain, ringing in the ears, wax buildup, runny nose, nasal congestion, bloody nose, or sore throat. Respiratory: Denies difficulty breathing, shortness of breath, cough or sputum production.   Cardiovascular: Denies chest pain, chest tightness, palpitations or swelling in the hands or feet.  Gastrointestinal: Denies abdominal pain, bloating, constipation, diarrhea or blood in the stool.  GU: Denies urgency, frequency, pain with urination, burning sensation, blood in urine, odor or discharge. Musculoskeletal: Pt reports left upper extremity weakness. Denies decrease in range of motion, difficulty with gait, muscle pain or joint pain and swelling.  Skin: Denies redness, rashes, lesions or ulcercations.  Neurological: Denies dizziness, difficulty with memory, difficulty with speech or problems with balance and coordination.  Psych: Denies anxiety, depression, SI/HI.  No other specific complaints in a complete review of systems (except as listed in HPI above).  Objective:   Physical Exam There were no vitals taken for this visit.   Wt Readings from Last 3 Encounters:  02/18/24 (!) 306 lb 3.2 oz (138.9 kg)  09/09/22 (!) 302 lb 3.2 oz (137.1 kg)  05/30/22 (!) 309 lb (140.2 kg)    General: Appears his stated age, obese, in NAD. Skin: Warm, dry and intact.  No ulcerations noted. HEENT: Head: normal shape and size; Eyes: sclera white, no icterus, conjunctiva pink, pupils pinpoint but PERRLA and EOMs intact;  Neck:  Neck supple, trachea midline. No masses, lumps or thyromegaly present.  Cardiovascular: Normal rate and rhythm. S1,S2 noted.  No murmur, rubs or gallops noted. No JVD or BLE edema.  Pulmonary/Chest: Normal effort and positive vesicular breath sounds. No respiratory distress. No wheezes, rales or ronchi noted.  Abdomen: Soft and nontender. Normal bowel sounds.  Musculoskeletal: Strength 4/5 LUE.  Strength 5/5 RUE and BLE.  No difficulty with gait.   Neurological: Alert and oriented. He is under the influence of THC. Cranial nerves II-XII grossly intact. Coordination normal.  Psychiatric: Mood and affect normal. Behavior is normal. Judgment and thought content normal.    BMET    Component Value Date/Time   NA 138 02/18/2024 1053   NA 140 12/30/2016 1001   K 4.3 02/18/2024 1053   CL 103 02/18/2024 1053   CO2 25 02/18/2024 1053   GLUCOSE 96 02/18/2024 1053   BUN 10 02/18/2024 1053   BUN 17 12/30/2016 1001   CREATININE 1.13 02/18/2024 1053   CALCIUM  9.3 02/18/2024 1053   GFRNONAA 86 12/30/2016 1001   GFRAA 99 12/30/2016 1001    Lipid Panel     Component Value Date/Time   CHOL 219 (H) 02/18/2024 1053   CHOL 166 12/30/2016 1001   TRIG 92 02/18/2024 1053   HDL 41 02/18/2024 1053  HDL 31 (L) 12/30/2016 1001   CHOLHDL 5.3 (H) 02/18/2024 1053   VLDL 24.2 05/13/2019 1625   LDLCALC 158 (H) 02/18/2024 1053    CBC    Component Value Date/Time   WBC 5.4 02/18/2024 1053   RBC 4.98 02/18/2024 1053   HGB 16.2 02/18/2024 1053   HGB 15.4 12/30/2016 1001   HCT 47.6 02/18/2024 1053   HCT 44.2 12/30/2016 1001   PLT 271 02/18/2024 1053   PLT 216 12/30/2016 1001   MCV 95.6 02/18/2024 1053   MCV 93 12/30/2016 1001   MCH 32.5 02/18/2024 1053   MCHC 34.0 02/18/2024 1053   RDW 13.1 02/18/2024 1053   RDW 13.5 12/30/2016 1001   LYMPHSABS 1.9 09/28/2015 1434   MONOABS 0.4 09/28/2015 1434   EOSABS 0.1 09/28/2015 1434   BASOSABS 0.0 09/28/2015 1434    Hgb A1C Lab Results  Component Value Date   HGBA1C 6.4 (H) 02/18/2024           Assessment & Plan:     RTC in 6 months, follow-up chronic conditions Angeline Laura, NP   "

## 2024-03-08 ENCOUNTER — Ambulatory Visit: Admitting: Internal Medicine

## 2024-03-29 ENCOUNTER — Encounter: Payer: Self-pay | Admitting: Internal Medicine
